# Patient Record
Sex: Female | Born: 1945
Health system: Southern US, Community
[De-identification: ages and names within clinical notes are randomized; demographics above are authoritative.]

## PROBLEM LIST (undated history)

## (undated) DIAGNOSIS — R55 Syncope and collapse: Secondary | ICD-10-CM

## (undated) DIAGNOSIS — R002 Palpitations: Secondary | ICD-10-CM

## (undated) DIAGNOSIS — R42 Dizziness and giddiness: Secondary | ICD-10-CM

## (undated) DIAGNOSIS — Z9889 Other specified postprocedural states: Secondary | ICD-10-CM

## (undated) DIAGNOSIS — Z87442 Personal history of urinary calculi: Secondary | ICD-10-CM

## (undated) DIAGNOSIS — I10 Essential (primary) hypertension: Secondary | ICD-10-CM

## (undated) DIAGNOSIS — D473 Essential (hemorrhagic) thrombocythemia: Secondary | ICD-10-CM

## (undated) DIAGNOSIS — R112 Nausea with vomiting, unspecified: Secondary | ICD-10-CM

## (undated) DIAGNOSIS — K219 Gastro-esophageal reflux disease without esophagitis: Secondary | ICD-10-CM

## (undated) DIAGNOSIS — E785 Hyperlipidemia, unspecified: Secondary | ICD-10-CM

## (undated) DIAGNOSIS — I4819 Other persistent atrial fibrillation: Secondary | ICD-10-CM

## (undated) DIAGNOSIS — I071 Rheumatic tricuspid insufficiency: Secondary | ICD-10-CM

## (undated) DIAGNOSIS — E1169 Type 2 diabetes mellitus with other specified complication: Secondary | ICD-10-CM

## (undated) DIAGNOSIS — I429 Cardiomyopathy, unspecified: Secondary | ICD-10-CM

## (undated) DIAGNOSIS — I472 Ventricular tachycardia, unspecified: Secondary | ICD-10-CM

## (undated) DIAGNOSIS — I4892 Unspecified atrial flutter: Secondary | ICD-10-CM

## (undated) DIAGNOSIS — Q211 Atrial septal defect, unspecified: Secondary | ICD-10-CM

## (undated) DIAGNOSIS — F419 Anxiety disorder, unspecified: Secondary | ICD-10-CM

## (undated) DIAGNOSIS — I5030 Unspecified diastolic (congestive) heart failure: Secondary | ICD-10-CM

## (undated) DIAGNOSIS — I340 Nonrheumatic mitral (valve) insufficiency: Secondary | ICD-10-CM

## (undated) DIAGNOSIS — I447 Left bundle-branch block, unspecified: Secondary | ICD-10-CM

## (undated) HISTORY — DX: Ventricular tachycardia, unspecified: I47.20

## (undated) HISTORY — PX: BREAST EXCISIONAL BIOPSY: SUR124

## (undated) HISTORY — DX: Atrial septal defect: Q21.1

## (undated) HISTORY — DX: Palpitations: R00.2

## (undated) HISTORY — DX: Left bundle-branch block, unspecified: I44.7

## (undated) HISTORY — DX: Essential (hemorrhagic) thrombocythemia: D47.3

## (undated) HISTORY — DX: Unspecified atrial flutter: I48.92

## (undated) HISTORY — DX: Dizziness and giddiness: R42

## (undated) HISTORY — PX: TUBAL LIGATION: SHX77

## (undated) HISTORY — DX: Cardiomyopathy, unspecified: I42.9

## (undated) HISTORY — DX: Nonrheumatic mitral (valve) insufficiency: I34.0

## (undated) HISTORY — DX: Ventricular tachycardia: I47.2

## (undated) HISTORY — DX: Rheumatic tricuspid insufficiency: I07.1

## (undated) HISTORY — PX: TONSILLECTOMY: SUR1361

## (undated) HISTORY — DX: Unspecified diastolic (congestive) heart failure: I50.30

## (undated) HISTORY — DX: Essential (primary) hypertension: I10

## (undated) HISTORY — PX: BREAST SURGERY: SHX581

## (undated) HISTORY — DX: Other persistent atrial fibrillation: I48.19

## (undated) HISTORY — DX: Hyperlipidemia, unspecified: E78.5

## (undated) HISTORY — DX: Atrial septal defect, unspecified: Q21.10

## (undated) HISTORY — DX: Syncope and collapse: R55

## (undated) HISTORY — PX: APPENDECTOMY: SHX54

---
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
## 1992-10-30 HISTORY — PX: ASD REPAIR: SHX258

## 1998-03-10 ENCOUNTER — Ambulatory Visit (HOSPITAL_COMMUNITY): Admission: RE | Admit: 1998-03-10 | Discharge: 1998-03-10 | Payer: Self-pay | Admitting: Family Medicine

## 1998-11-03 ENCOUNTER — Other Ambulatory Visit: Admission: RE | Admit: 1998-11-03 | Discharge: 1998-11-03 | Payer: Self-pay | Admitting: Family Medicine

## 1999-12-21 ENCOUNTER — Other Ambulatory Visit: Admission: RE | Admit: 1999-12-21 | Discharge: 1999-12-21 | Payer: Self-pay | Admitting: Family Medicine

## 2001-02-14 ENCOUNTER — Other Ambulatory Visit: Admission: RE | Admit: 2001-02-14 | Discharge: 2001-02-14 | Payer: Self-pay | Admitting: General Surgery

## 2002-02-14 ENCOUNTER — Ambulatory Visit (HOSPITAL_COMMUNITY): Admission: RE | Admit: 2002-02-14 | Discharge: 2002-02-14 | Payer: Self-pay | Admitting: General Surgery

## 2002-02-14 ENCOUNTER — Encounter: Payer: Self-pay | Admitting: General Surgery

## 2002-03-06 ENCOUNTER — Other Ambulatory Visit: Admission: RE | Admit: 2002-03-06 | Discharge: 2002-03-06 | Payer: Self-pay | Admitting: General Surgery

## 2002-04-09 ENCOUNTER — Ambulatory Visit (HOSPITAL_COMMUNITY): Admission: RE | Admit: 2002-04-09 | Discharge: 2002-04-09 | Payer: Self-pay | Admitting: Cardiology

## 2003-03-25 ENCOUNTER — Ambulatory Visit (HOSPITAL_COMMUNITY): Admission: RE | Admit: 2003-03-25 | Discharge: 2003-03-25 | Payer: Self-pay | Admitting: General Surgery

## 2003-03-25 ENCOUNTER — Encounter: Payer: Self-pay | Admitting: General Surgery

## 2004-05-13 ENCOUNTER — Ambulatory Visit (HOSPITAL_COMMUNITY): Admission: RE | Admit: 2004-05-13 | Discharge: 2004-05-13 | Payer: Self-pay | Admitting: Family Medicine

## 2004-05-25 ENCOUNTER — Ambulatory Visit (HOSPITAL_COMMUNITY): Admission: RE | Admit: 2004-05-25 | Discharge: 2004-05-25 | Payer: Self-pay | Admitting: Family Medicine

## 2004-09-14 ENCOUNTER — Ambulatory Visit: Payer: Self-pay | Admitting: Family Medicine

## 2004-10-26 ENCOUNTER — Ambulatory Visit: Payer: Self-pay | Admitting: Family Medicine

## 2004-11-15 ENCOUNTER — Ambulatory Visit: Payer: Self-pay | Admitting: Family Medicine

## 2005-02-21 ENCOUNTER — Ambulatory Visit: Payer: Self-pay | Admitting: Family Medicine

## 2005-05-24 ENCOUNTER — Ambulatory Visit: Payer: Self-pay | Admitting: Family Medicine

## 2005-07-12 ENCOUNTER — Ambulatory Visit: Payer: Self-pay | Admitting: Family Medicine

## 2005-09-01 ENCOUNTER — Ambulatory Visit (HOSPITAL_COMMUNITY): Admission: RE | Admit: 2005-09-01 | Discharge: 2005-09-01 | Payer: Self-pay | Admitting: Family Medicine

## 2005-09-27 ENCOUNTER — Ambulatory Visit (HOSPITAL_COMMUNITY): Admission: RE | Admit: 2005-09-27 | Discharge: 2005-09-27 | Payer: Self-pay | Admitting: Family Medicine

## 2006-02-14 ENCOUNTER — Ambulatory Visit: Payer: Self-pay | Admitting: Family Medicine

## 2006-03-22 ENCOUNTER — Ambulatory Visit: Payer: Self-pay | Admitting: Family Medicine

## 2006-07-10 ENCOUNTER — Ambulatory Visit: Payer: Self-pay | Admitting: Family Medicine

## 2006-09-04 ENCOUNTER — Ambulatory Visit: Payer: Self-pay | Admitting: Family Medicine

## 2006-10-01 ENCOUNTER — Ambulatory Visit (HOSPITAL_COMMUNITY): Admission: RE | Admit: 2006-10-01 | Discharge: 2006-10-01 | Payer: Self-pay | Admitting: General Surgery

## 2006-11-07 ENCOUNTER — Ambulatory Visit (HOSPITAL_COMMUNITY): Admission: RE | Admit: 2006-11-07 | Discharge: 2006-11-07 | Payer: Self-pay | Admitting: General Surgery

## 2006-11-14 ENCOUNTER — Ambulatory Visit (HOSPITAL_COMMUNITY): Admission: RE | Admit: 2006-11-14 | Discharge: 2006-11-14 | Payer: Self-pay | Admitting: General Surgery

## 2006-11-29 ENCOUNTER — Ambulatory Visit (HOSPITAL_COMMUNITY): Admission: RE | Admit: 2006-11-29 | Discharge: 2006-11-29 | Payer: Self-pay | Admitting: General Surgery

## 2006-11-29 ENCOUNTER — Encounter (INDEPENDENT_AMBULATORY_CARE_PROVIDER_SITE_OTHER): Payer: Self-pay | Admitting: *Deleted

## 2007-01-07 ENCOUNTER — Other Ambulatory Visit: Admission: RE | Admit: 2007-01-07 | Discharge: 2007-01-07 | Payer: Self-pay | Admitting: General Surgery

## 2007-01-07 ENCOUNTER — Encounter (INDEPENDENT_AMBULATORY_CARE_PROVIDER_SITE_OTHER): Payer: Self-pay | Admitting: Specialist

## 2007-01-15 ENCOUNTER — Ambulatory Visit: Payer: Self-pay | Admitting: Family Medicine

## 2007-03-27 ENCOUNTER — Ambulatory Visit: Payer: Self-pay | Admitting: Family Medicine

## 2008-01-23 ENCOUNTER — Ambulatory Visit (HOSPITAL_COMMUNITY): Admission: RE | Admit: 2008-01-23 | Discharge: 2008-01-23 | Payer: Self-pay | Admitting: General Surgery

## 2009-03-17 ENCOUNTER — Ambulatory Visit (HOSPITAL_COMMUNITY): Admission: RE | Admit: 2009-03-17 | Discharge: 2009-03-17 | Payer: Self-pay | Admitting: General Surgery

## 2009-10-30 DIAGNOSIS — I447 Left bundle-branch block, unspecified: Secondary | ICD-10-CM

## 2009-10-30 HISTORY — DX: Left bundle-branch block, unspecified: I44.7

## 2009-12-24 ENCOUNTER — Encounter (INDEPENDENT_AMBULATORY_CARE_PROVIDER_SITE_OTHER): Payer: Self-pay | Admitting: *Deleted

## 2009-12-24 ENCOUNTER — Observation Stay (HOSPITAL_COMMUNITY): Admission: EM | Admit: 2009-12-24 | Discharge: 2009-12-25 | Payer: Self-pay | Admitting: Emergency Medicine

## 2009-12-24 ENCOUNTER — Ambulatory Visit: Payer: Self-pay | Admitting: Cardiovascular Disease

## 2009-12-24 HISTORY — PX: CARDIAC CATHETERIZATION: SHX172

## 2009-12-24 LAB — CONVERTED CEMR LAB
BUN: 13 mg/dL
CO2: 25 meq/L
Calcium: 9.4 mg/dL
Chloride: 106 meq/L
Cholesterol: 183 mg/dL
Creatinine, Ser: 0.65 mg/dL
GFR calc non Af Amer: >60 mL/min
Glomerular Filtration Rate, Af Am: >60 mL/min/{1.73_m2}
Glucose, Bld: 113 mg/dL
HDL: 37 mg/dL
Hgb A1c MFr Bld: 6.4 %
LDL Cholesterol: 68 mg/dL
Magnesium: 2 mg/dL
Potassium: 3.7 meq/L
Sodium: 142 meq/L
Triglycerides: 392 mg/dL

## 2009-12-27 ENCOUNTER — Encounter (INDEPENDENT_AMBULATORY_CARE_PROVIDER_SITE_OTHER): Payer: Self-pay

## 2009-12-27 DIAGNOSIS — R55 Syncope and collapse: Secondary | ICD-10-CM | POA: Insufficient documentation

## 2009-12-27 DIAGNOSIS — R42 Dizziness and giddiness: Secondary | ICD-10-CM | POA: Insufficient documentation

## 2009-12-27 DIAGNOSIS — R079 Chest pain, unspecified: Secondary | ICD-10-CM | POA: Insufficient documentation

## 2009-12-27 DIAGNOSIS — R002 Palpitations: Secondary | ICD-10-CM | POA: Insufficient documentation

## 2010-01-06 ENCOUNTER — Ambulatory Visit: Payer: Self-pay | Admitting: Cardiology

## 2010-01-11 ENCOUNTER — Encounter (INDEPENDENT_AMBULATORY_CARE_PROVIDER_SITE_OTHER): Payer: Self-pay | Admitting: *Deleted

## 2010-01-14 ENCOUNTER — Ambulatory Visit: Payer: Self-pay | Admitting: Cardiology

## 2010-02-11 ENCOUNTER — Encounter: Payer: Self-pay | Admitting: Cardiology

## 2010-02-11 ENCOUNTER — Ambulatory Visit: Payer: Self-pay | Admitting: Cardiology

## 2010-02-15 ENCOUNTER — Encounter: Payer: Self-pay | Admitting: Cardiology

## 2010-03-21 ENCOUNTER — Telehealth (INDEPENDENT_AMBULATORY_CARE_PROVIDER_SITE_OTHER): Payer: Self-pay | Admitting: *Deleted

## 2010-04-27 ENCOUNTER — Encounter (INDEPENDENT_AMBULATORY_CARE_PROVIDER_SITE_OTHER): Payer: Self-pay | Admitting: *Deleted

## 2010-04-27 DIAGNOSIS — E785 Hyperlipidemia, unspecified: Secondary | ICD-10-CM | POA: Insufficient documentation

## 2010-05-05 ENCOUNTER — Ambulatory Visit (HOSPITAL_COMMUNITY): Admission: RE | Admit: 2010-05-05 | Discharge: 2010-05-05 | Payer: Self-pay | Admitting: General Surgery

## 2010-05-27 ENCOUNTER — Encounter: Payer: Self-pay | Admitting: Cardiology

## 2010-05-27 ENCOUNTER — Encounter (INDEPENDENT_AMBULATORY_CARE_PROVIDER_SITE_OTHER): Payer: Self-pay | Admitting: *Deleted

## 2010-05-27 LAB — CONVERTED CEMR LAB
Cholesterol: 160 mg/dL
Cholesterol: 160 mg/dL (ref 0–200)
HDL: 38 mg/dL
HDL: 38 mg/dL — ABNORMAL LOW (ref >39)
LDL Cholesterol: 61 mg/dL
LDL Cholesterol: 61 mg/dL (ref 0–99)
Total CHOL/HDL Ratio: 4.2
Triglycerides: 306 mg/dL
Triglycerides: 306 mg/dL — ABNORMAL HIGH (ref <150)
VLDL: 61 mg/dL — ABNORMAL HIGH (ref 0–40)

## 2010-06-02 ENCOUNTER — Encounter (INDEPENDENT_AMBULATORY_CARE_PROVIDER_SITE_OTHER): Payer: Self-pay | Admitting: *Deleted

## 2010-06-02 ENCOUNTER — Telehealth (INDEPENDENT_AMBULATORY_CARE_PROVIDER_SITE_OTHER): Payer: Self-pay | Admitting: *Deleted

## 2010-07-14 ENCOUNTER — Encounter (INDEPENDENT_AMBULATORY_CARE_PROVIDER_SITE_OTHER): Payer: Self-pay | Admitting: *Deleted

## 2010-07-18 ENCOUNTER — Encounter (INDEPENDENT_AMBULATORY_CARE_PROVIDER_SITE_OTHER): Payer: Self-pay | Admitting: *Deleted

## 2010-07-18 ENCOUNTER — Ambulatory Visit: Payer: Self-pay | Admitting: Cardiology

## 2010-08-16 LAB — CONVERTED CEMR LAB
BUN: 16 mg/dL (ref 6–23)
CO2: 28 meq/L (ref 19–32)
Calcium: 10 mg/dL (ref 8.4–10.5)
Chloride: 102 meq/L (ref 96–112)
Creatinine, Ser: 0.71 mg/dL (ref 0.40–1.20)
Glucose, Bld: 111 mg/dL — ABNORMAL HIGH (ref 70–99)
Potassium: 4.9 meq/L (ref 3.5–5.3)
Sodium: 140 meq/L (ref 135–145)

## 2010-09-19 LAB — CONVERTED CEMR LAB
ALT: 23 units/L (ref 0–35)
AST: 22 units/L (ref 0–37)
Albumin: 4.7 g/dL (ref 3.5–5.2)
Alkaline Phosphatase: 92 units/L (ref 39–117)
BUN: 15 mg/dL (ref 6–23)
CO2: 29 meq/L (ref 19–32)
Calcium: 9.7 mg/dL (ref 8.4–10.5)
Chloride: 102 meq/L (ref 96–112)
Cholesterol: 191 mg/dL (ref 0–200)
Creatinine, Ser: 0.63 mg/dL (ref 0.40–1.20)
Glucose, Bld: 91 mg/dL (ref 70–99)
HDL: 38 mg/dL — ABNORMAL LOW (ref >39)
LDL Cholesterol: 89 mg/dL (ref 0–99)
Potassium: 5.3 meq/L (ref 3.5–5.3)
Sodium: 143 meq/L (ref 135–145)
Total Bilirubin: 0.4 mg/dL (ref 0.3–1.2)
Total CHOL/HDL Ratio: 5
Total Protein: 7.2 g/dL (ref 6.0–8.3)
Triglycerides: 321 mg/dL — ABNORMAL HIGH (ref <150)
VLDL: 64 mg/dL — ABNORMAL HIGH (ref 0–40)

## 2010-09-29 ENCOUNTER — Encounter: Payer: Self-pay | Admitting: Cardiology

## 2010-11-29 ENCOUNTER — Encounter (INDEPENDENT_AMBULATORY_CARE_PROVIDER_SITE_OTHER): Payer: Self-pay | Admitting: *Deleted

## 2010-11-29 LAB — CONVERTED CEMR LAB
ALT: 15 units/L
AST: 13 units/L
Albumin: 4.6 g/dL
Alkaline Phosphatase: 88 units/L
BUN: 18 mg/dL
CO2: 30 meq/L
Calcium: 9.8 mg/dL
Chloride: 101 meq/L
Cholesterol: 189 mg/dL
Creatinine, Ser: 0.73 mg/dL
GFR calc non Af Amer: >60 mL/min
Glomerular Filtration Rate, Af Am: >60 mL/min/{1.73_m2}
Glucose, Bld: 103 mg/dL
HDL: 37 mg/dL
LDL Cholesterol: 96 mg/dL
Potassium: 4.9 meq/L
Sodium: 141 meq/L
Total Protein: 7.2 g/dL
Triglycerides: 278 mg/dL

## 2010-12-01 NOTE — Miscellaneous (Signed)
Summary: hospital labs 12/24/2009  Clinical Lists Changes  Observations: Added new observation of MAGNESIUM: 2.0 mg/dL (16/07/9603 54:09) Added new observation of CALCIUM: 9.4 mg/dL (81/19/1478 29:56) Added new observation of GFR AA: >60 mL/min/1.81m2 (12/24/2009 10:04) Added new observation of GFR: >60 mL/min (12/24/2009 10:04) Added new observation of CREATININE: 0.65 mg/dL (21/30/8657 84:69) Added new observation of BUN: 13 mg/dL (62/95/2841 32:44) Added new observation of BG RANDOM: 113 mg/dL (11/01/7251 66:44) Added new observation of CO2 PLSM/SER: 25 meq/L (12/24/2009 10:04) Added new observation of CL SERUM: 106 meq/L (12/24/2009 10:04) Added new observation of K SERUM: 3.7 meq/L (12/24/2009 10:04) Added new observation of NA: 142 meq/L (12/24/2009 10:04) Added new observation of LDL: 68 mg/dL (03/47/4259 56:38) Added new observation of HDL: 37 mg/dL (75/64/3329 51:88) Added new observation of TRIGLYC TOT: 392 mg/dL (41/66/0630 16:01) Added new observation of CHOLESTEROL: 183 mg/dL (09/32/3557 32:20) Added new observation of HGBA1C: 6.4 % (12/24/2009 10:04)

## 2010-12-01 NOTE — Letter (Signed)
Summary: [place] [facility] Work Engineer, agricultural at [employer]  [address] [place] [address]   Phone: [phone]  Fax: [phone]     July 18, 2010 MRN: [id number]   Heather Barber [address] [place], [place]  [address]      YOUR LAB WORK IS DUE   September 19, 2010  Please go to [facility], located across the street from [facility] on the second floor.  Hours are Monday - Friday 7am until 7:30pm         Saturday 8am until 12noon    _X_  DO NOT EAT OR DRINK AFTER MIDNIGHT EVENING PRIOR TO LABWORK  __ YOUR LABWORK IS NOT FASTING --YOU MAY EAT PRIOR TO LABWORK

## 2010-12-01 NOTE — Letter (Signed)
Summary: [place] [facility] Work Engineer, agricultural at [employer]  [address] [place] [address]   Phone: [phone]  Fax: [phone]     April 27, 2010 MRN: [id number]   Heather Barber [address] [place], [place]  [address]      YOUR LAB WORK IS DUE   May 27, 2010  Please go to [facility], located across the street from [facility] on the second floor.  Hours are Monday - Friday 7am until 7:30pm         Saturday 8am until 12noon    _X_  DO NOT EAT OR DRINK AFTER MIDNIGHT EVENING PRIOR TO LABWORK  __ YOUR LABWORK IS NOT FASTING --YOU MAY EAT PRIOR TO LABWORK

## 2010-12-01 NOTE — Assessment & Plan Note (Signed)
Summary: 6 mth f/u per checkout on 01/14/10/tg   Visit Type:  Follow-up Primary Provider:  Dr. Lysbeth Galas   History of Present Illness: [name] returns to the office for continued assessment and treatment of cardiovascular risk factors with negative coronary angiography in 2011, left bundle branch block, a remote ASD repair, and exercise-induced ventricular tachycardia.  Since her last visit 6 months ago, she has done quite well.  She has experienced no chest discomfort, no dyspnea, no lightheadedness, no syncope and no palpitations.  She has tolerated, without adverse effects, changes to her therapy for hyperlipidemia and hypertension.  Current Medications (verified): 1)  Aspir-Low 81 Mg Tbec (Aspirin) .... Take 1 Tab Daily 2)  Pravastatin Sodium 80 Mg Tabs (Pravastatin Sodium) .... Take One Tablet By Mouth Daily At Bedtime 3)  Prilosec 20 Mg Cpdr (Omeprazole) .... Take 1 Tab Daily 4)  Fish Oil 1000 Mg Caps (Omega-3 Fatty Acids) .... Take 1 Cap Two Times A Day 5)  Daily Multi  Tabs (Multiple Vitamins-Minerals) .... Take 1 Tab Daily 6)  Diltiazem Hcl Er Beads 180 Mg Xr24h-Cap (Diltiazem Hcl Er Beads) .... Take One Capsule By Mouth Daily 7)  Hydrochlorothiazide 12.5 Mg Tabs (Hydrochlorothiazide) .... Take 1 Tablet By Mouth Once A Day  Allergies (verified): 1)  ! Morphine  Past History:  PMH, FH, and Social History reviewed and updated.  Review of Systems       See history of present illness.  Vital Signs:  Patient profile:   65 year old female Weight:      177 pounds Pulse rate:   84 / minute BP sitting:   151 / 80  (right arm)  Vitals Entered By: Dreama Saa, CNA (July 18, 2010 10:40 AM)  Physical Exam  General:    Overweight; no acute distress.  NECK: Supple; no JVD.  Normal carotid pulsations.  CV: Nl S1and S2;   2-3/6 SEM radiating to both carotids.  LUNGS: Clear to auscultation bilaterally.Marland Kitchen  SKIN: No lesions.   ABDOMEN:  Soft, nondistended, nontender.  No  guarding or rigidity.  EXT: Trace pedal edema bilaterally;  No cyanosis or clubbing.  NEURO:  Alert and oriented x3.  Cranial nerves II-XII   are grossly intact.  Strength is 5/5 in all extremities    Impression & Recommendations:  Problem # 1:  VENTRICULAR TACHYCARDIA, EXERCISE-INDUCED (ICD-427.1) She is asymptomatic with current therapy and believes that treatment with diltiazem has had a salutary effect on her symptoms.  This medication is also contributing to treatment for hypertension.  Problem # 2:  HYPERTENSION (ICD-401.1) Blood pressure was somewhat suboptimal today.  She has been taking hydrochlorothiazide on a p.r.n. basis and will begin taking it daily.  We will monitor blood pressure, electrolytes and renal function.  Problem # 3:  HYPERLIPIDEMIA (ICD-272.4) Recent lipid profile was good, but triglycerides remain elevated.   Since she has no known vascular disease, perfect control of lipids is not necessarily required.  To decrease her cost of pharmaceuticals, atorvastatin will be changed to pravastatin 80 mg q.d. with a repeat lipid profile in one month or I will be happy to continue to see this nice woman on an annual basis for reevaluation and adjustment of her medical regime.  CHOL: 160 (05/27/2010)   LDL: 61 (05/27/2010)   HDL: 38 (05/27/2010)   TG: 306 (05/27/2010)  Other Orders: Future Orders: T-Basic Metabolic Panel [id number]) ... 08/17/2010 T-Lipid Profile [phone]) ... 09/19/2010 T-Comprehensive Metabolic Panel [id number]) ... 09/19/2010  Patient Instructions: 1)  Your physician recommends that you schedule a follow-up appointment in: 1 YEAR 2)  Your physician recommends that you return for lab work in: 1 AND 2 MONTHS 3)  Your physician has recommended you make the following change in your medication:  AFTER CURRENT RX STOOP LIPITOR AND START PRAVASTATIN 80MG  DAILY, TAKE HYDROCHLORATHIAZIDE DAILY 4)  Your physician has requested that you regularly monitor  and record your blood pressure readings at home.  Please use the same machine at the same time of day to check your readings and record them to bring to your follow-up visit. CALL OUR OFFICE IF TOP NUMBER IS > 140 Prescriptions: HYDROCHLOROTHIAZIDE 12.5 MG TABS (HYDROCHLOROTHIAZIDE) Take 1 tablet by mouth once a day  #30 x 6   Entered by:   Teressa Lower RN   Authorized by:   Kathlen Brunswick, MD, [facility]   Signed by:   Teressa Lower RN on 07/18/2010   Method used:   Faxed to ...       Hospital doctor (retail)       [address]       [place], [place]  [address]       Ph: [phone] or [phone]       Fax: [phone]   RxID:   [id number] PRAVASTATIN SODIUM 80 MG TABS (PRAVASTATIN SODIUM) Take one tablet by mouth daily at bedtime  #30 x 6   Entered by:   Teressa Lower RN   Authorized by:   Kathlen Brunswick, MD, [facility]   Signed by:   Teressa Lower RN on 07/18/2010   Method used:   Faxed to ...       Hospital doctor (retail)       [address]       [place], [place]  [address]       Ph: [phone] or [phone]       Fax: [phone]   RxID:   [id number]

## 2010-12-01 NOTE — Assessment & Plan Note (Signed)
Summary: PT PAST DUE FOR F/U PER PT PHONE CALL/TG   Visit Type:  Follow-up Primary Provider:  Dr. Lysbeth Galas   History of Present Illness: Patient returns to see me after a 17 year hiatus and a recent admission to [facility] with chest pain.  Cardiac catheterization revealed normal coronary arteries and normal left ventricular systolic function.  Although not emphasized in the chart notes, the patient describes palpitations accompanying her chest discomfort, diaphoresis and dyspnea.  Episodes lasted for a number of minutes and resolved spontaneously.  The symptoms had been occurring over the month prior to admission.  She sought attention at her physician's office, who arranged transportation to [facility] by EMS.  She has not had any problems since hospital discharge.  No changes were made in her medications.  She is now wearing an event recorder, but no additional symptomatic spells have been reported and no significant arrhythmias recorded.  She has had some PVCs, normal sinus rhythm and sinus tachycardia.  Current Medications (verified): 1)  Aspir-Low 81 Mg Tbec (Aspirin) .... Take 1 Tab Daily 2)  Simvastatin 80 Mg Tabs (Simvastatin) .... Take 1 Tab Daily 3)  Prilosec 20 Mg Cpdr (Omeprazole) .... Take 1 Tab Daily 4)  Fish Oil 1000 Mg Caps (Omega-3 Fatty Acids) .... Take 1 Cap Two Times A Day 5)  Daily Multi  Tabs (Multiple Vitamins-Minerals) .... Take 1 Tab Daily 6)  Diltiazem Hcl Er Beads 180 Mg Xr24h-Cap (Diltiazem Hcl Er Beads) .... Take One Capsule By Mouth Daily  Allergies (verified): 1)  ! Morphine  Past History:  Past Surgical History: Last updated: 01/13/2010 ASD closure had [facility] in 1994 Appendectomy Excisional biopsy from right and left breast BTL Tonsillectomy  Family History: Last updated: February 08, 2010 Mother deceased due to neoplastic disease No family history for coronary disease Siblings-sister had ASD or VSD; second sister had history  of CVA  Social History: Last updated: 02-08-10 Married and resides in [place] with her husband; 3 children Employment-local printing shop Tobacco-none Alcohol-none Lifestyle-sedentary  Past Medical History: Chest pain-normal coronary angiography in 1994 and 2011; Left bundle branch block-onset in 2011 ASD repair-1994 Exercise-induced ventricular tachycardia Hyperlipidemia Hypertension DIZZINESS (ICD-780.4) SYNCOPE (ICD-780.2) PALPITATIONS (ICD-785.1)  Family History: Mother deceased due to neoplastic disease No family history for coronary disease Siblings-sister had ASD or VSD; second sister had history of CVA  Social History: Married and resides in [place] with her husband; 3 children Employment-local printing shop Tobacco-none Alcohol-none Lifestyle-sedentary   Review of Systems  The patient denies weight loss, weight gain, chest pain, syncope, dyspnea on exertion, peripheral edema, prolonged cough, and headaches.    Vital Signs:  Patient profile:   65 year old female Height:      64 inches Weight:      176 pounds BMI:     30.32 Pulse rate:   93 / minute BP sitting:   131 / 72  (right arm)  Vitals Entered By: Dreama Saa, CNA (Feb 08, 2010 2:11 PM)  Physical Exam  General:    No acute distress.   HEENT:  Pupils equal, round and reactive to light.  The extraocular movements are intact.  Sclerae are clear.  Moist mucous membranes. NECK: Supple; no JVD.  Normal carotid pulsations.  CV: Nl S1and S2;   2-3/6 SEM radiating to both carotids.  LUNGS: Clear to auscultation bilaterally.Marland Kitchen  SKIN: No lesions.  ABDOMEN:  Soft, nondistended, nontender.  No guarding or rigidity.   EXT: Trace pedal edema bilaterally;  No cyanosis or clubbing.   NEURO:  Alert and oriented x3.  Cranial nerves II-XII   are grossly intact.  Strength is 5/5 in all extremities    Impression & Recommendations:  Problem # 1:  CHEST PAIN (ICD-786.50) Assessment Improved History is  highly suggestive of an arrhythmia as the cause of her symptoms, which is particularly of interest in light of her history of exercise-induced ventricular tachycardia.  With normal left ventricular systolic function, no electrocardiographic abnormalities and normal coronary arteries, even ventricular tachycardia would likely be a benign arrhythmia.  In the absence of recurrent symptoms, the etiology cannot be determined.  I suggested that she present to emergency department or to our office should she have recurrent symptoms, depending upon the time of day or night.  I will plan to see this nice woman again in 6 months.  Problem # 2:  VENTRICULAR TACHYCARDIA, EXERCISE-INDUCED (ICD-427.1) Since she requires treatment for hypertension, it is reasonable to use a drug that also might have beneficial effects on to an arrhythmia.  Hyzaar will be discontinued and diltiazem 180 mg q.d. started.   Problem # 3:  HYPERTENSION (ICD-401.1) Patient will monitor blood pressures at home and return in one month for blood pressure check by the cardiology nurses.  I will see this nice woman again in 6 months.  Patient Instructions: 1)  Your physician recommends that you schedule a follow-up appointment in: 6 MONTHS 2)  Your physician has recommended you make the following change in your medication: STOP HYZAAR, START DILTIAZEM 180MG  DAILY 3)  You have been referred to NURSE VISIT FOR BP CHECK IN 1 MONTH, BRING BP READINGS TO NURSE VISIT 4)  Your physician has requested that you regularly monitor and record your blood pressure readings at home.  Please use the same machine at the same time of day to check your readings and record them to bring to your follow-up visit. Prescriptions: DILTIAZEM HCL ER BEADS 180 MG XR24H-CAP (DILTIAZEM HCL ER BEADS) Take one capsule by mouth daily  #30 x 3   Entered by:   Teressa Lower RN   Authorized by:   Kathlen Brunswick, MD, [facility]   Signed by:   Teressa Lower RN on 01/14/2010    Method used:   Faxed to ...       Hospital doctor (retail)       [address]       [place], [place]  [address]       Ph: [phone] or [phone]       Fax: [phone]   RxID:   [id number]   Appended Document: PT PAST DUE FOR F/U PER PT PHONE CALL/TG Review of all statin users in our practice revealed that Ms. Sou is being treated with high dose simvastatin plus diltiazem.  Simvastatin will be discontinued and atorvastatin 40 mg once daily started.  Lipid profile will be reassessed in one month.  Patient is due to return to see me in 3 months.  [place] Bing, M.D.

## 2010-12-01 NOTE — Letter (Signed)
Summary: BP READINGS  BP READINGS   Imported By: Faythe Ghee 02/15/2010 16:19:53  _____________________________________________________________________  External Attachment:    Type:   Image     Comment:   External Document

## 2010-12-01 NOTE — Miscellaneous (Signed)
  Clinical Lists Changes  Medications: Changed medication from HYDROCHLOROTHIAZIDE 12.5 MG TABS (HYDROCHLOROTHIAZIDE) Take 1 tablet by mouth once a day to HYDROCHLOROTHIAZIDE 12.5 MG TABS (HYDROCHLOROTHIAZIDE) Take 1 tablet by mouth once a day - Signed Rx of HYDROCHLOROTHIAZIDE 12.5 MG TABS (HYDROCHLOROTHIAZIDE) Take 1 tablet by mouth once a day;  #90 x 2;  Signed;  Entered by: Teressa Lower RN;  Authorized by: Kathlen Brunswick, MD, [facility];  Method used: Faxed to [facility] [place], , , [place]  , Ph: [phone], Fax: [phone]    Prescriptions: HYDROCHLOROTHIAZIDE 12.5 MG TABS (HYDROCHLOROTHIAZIDE) Take 1 tablet by mouth once a day  #90 x 2   Entered by:   Teressa Lower RN   Authorized by:   Kathlen Brunswick, MD, [facility]   Signed by:   Teressa Lower RN on 09/29/2010   Method used:   Faxed to ...       [facility] [place] (mail-order)             , [place]         Ph: [phone]       Fax: [phone]   RxID:   [id number]

## 2010-12-01 NOTE — Letter (Signed)
Summary: [place] [facility] Work Engineer, agricultural at [employer]  [address], [place] [address]   Phone: [phone]  Fax: [phone]     July 18, 2010 MRN: [id number]   Heather Barber [address] [place], [place]  [address]      YOUR LAB WORK IS DUE   August 17, 2010  Please go to [facility], located across the street from [facility] on the second floor.  Hours are Monday - Friday 7am until 7:30pm         Saturday 8am until 12noon    __  DO NOT EAT OR DRINK AFTER MIDNIGHT EVENING PRIOR TO LABWORK  _X_ YOUR LABWORK IS NOT FASTING --YOU MAY EAT PRIOR TO LABWORK

## 2010-12-01 NOTE — Miscellaneous (Signed)
**Note De-Identified Heather Barber Obfuscation** Summary: Orders Update/Event monitor  Clinical Lists Changes  Problems: Added new problem of CHEST PAIN (ICD-786.50) Added new problem of PALPITATIONS (ICD-785.1) Added new problem of SYNCOPE (ICD-780.2) Added new problem of DIZZINESS (ICD-780.4) Orders: Added new Referral order of Cardionet/Event Monitor (Cardionet/Event) - Signed    Patient is aware of 21 day Event monitor. Heather Barber states that she thinks she can hook monitor up at home, she has been advised to call or come by office if she has trouble with hook up.

## 2010-12-01 NOTE — Letter (Signed)
Summary: [place] Results Engineer, agricultural at [facility]  [address] [place] [address]   Phone: [phone]  Fax: [phone]      June 02, 2010 MRN: [id number]   Heather Barber [address] [place], [place]  [address]   Dear [name],  Your test ordered by Selena Batten has been reviewed by your physician (or physician assistant) and was found to be normal or stable. Your physician (or physician assistant) felt no changes were needed at this time.  ____ Echocardiogram  ____ Cardiac Stress Test  __ x__ Lab Work  ____ Peripheral vascular study of arms, legs or neck  ____ CT scan or X-ray  ____ Lung or Breathing test  ____ Other:  No change in medical treatment at this time, per Dr. Dietrich Pates.  Enclosed is a copy of your labwork for your records.  Thank you, [name] RN    [place] Bing, MD, [facility].Gaylord Shih, MD, F.A.C.C Lewayne Bunting, MD, F.A.C.C Nona Dell, MD, F.A.C.C Charlton Haws, MD, [facility].C.C

## 2010-12-01 NOTE — Progress Notes (Signed)
Summary: Lab Results  Phone Note Call from Patient   Caller: Patient Reason for Call: Lab or Test Results, Privacy/Consent Authorization Summary of Call: pt would like lab results/tg Initial call taken by: Raechel Ache [facility],  June 02, 2010 10:22 AM  Follow-up for Phone Call        I called pt , results given , handout sent on triglycerides Follow-up by: Teressa Lower RN,  June 02, 2010 10:42 AM

## 2010-12-01 NOTE — Miscellaneous (Signed)
Summary: LIPIDS,05/27/2010  Clinical Lists Changes  Observations: Added new observation of LDL: 61 mg/dL (16/07/9603 54:09) Added new observation of HDL: 38 mg/dL (81/19/1478 29:56) Added new observation of TRIGLYC TOT: 306 mg/dL (21/30/8657 84:69) Added new observation of CHOLESTEROL: 160 mg/dL (62/95/2841 32:44)

## 2010-12-01 NOTE — Miscellaneous (Signed)
  Clinical Lists Changes  Problems: Added new problem of HYPERLIPIDEMIA (ICD-272.4) Medications: Changed medication from SIMVASTATIN 80 MG TABS (SIMVASTATIN) take 1 tab daily to LIPITOR 40 MG TABS (ATORVASTATIN CALCIUM) Take one tablet by mouth daily. - Signed Rx of LIPITOR 40 MG TABS (ATORVASTATIN CALCIUM) Take one tablet by mouth daily.;  #30 x 3;  Signed;  Entered by: Teressa Lower RN;  Authorized by: Kathlen Brunswick, MD, [facility];  Method used: Faxed to [facility], [phone] [address], [place], [place], [place]  [address], Ph: [phone] or [phone], Fax: [phone] Orders: Added new Test order of T-Lipid Profile [id number]) - Signed    Prescriptions: LIPITOR 40 MG TABS (ATORVASTATIN CALCIUM) Take one tablet by mouth daily.  #30 x 3   Entered by:   Teressa Lower RN   Authorized by:   Kathlen Brunswick, MD, [facility]   Signed by:   Teressa Lower RN on 04/27/2010   Method used:   Faxed to ...       Hospital doctor (retail)       [address]       [place], [place]  [address]       Ph: [phone] or [phone]       Fax: [phone]   RxID:   [id number]

## 2010-12-01 NOTE — Procedures (Signed)
Summary: [facility]   Imported By: Faythe Ghee 02/11/2010 15:08:24  _____________________________________________________________________  External Attachment:    Type:   Image     Comment:   External Document

## 2010-12-01 NOTE — Assessment & Plan Note (Signed)
Summary: 1 mth nurse visit per checkout on 01/14/10/tg  Nurse Visit   Vital Signs:  Patient profile:   65 year old female Height:      64 inches Weight:      175 pounds O2 Sat:      96 % on Room air Pulse rate:   74 / minute BP sitting:   132 / 77  (left arm)  Vitals Entered By: Teressa Lower RN (February 11, 2010 4:20 PM)  O2 Flow:  Room air  Visit Type:  1 month nurse visit Primary Provider:  Dr. Lysbeth Galas   History of Present Illness: S:1 month nurse visit B:last appt 01/14/2010, stoped  hyzaar and started diltiazem for hr and bp control A:  denies c/o, feeling much better, 1+ pitting edema in ankles bp diary 30 readings average hr:  82 , sbp:  150, dbp:  78, sbp > 150-8, dbp > 85-3 R: information given for low salt diet  4/21/11Change in medication appears effective.  F/U with me as planned.  [place] Bing, M.D.    Allergies (verified): 1)  ! Morphine

## 2010-12-01 NOTE — Progress Notes (Signed)
Summary: Legs Swelling  Phone Note Call from Patient   Caller: Patient Reason for Call: Talk to Nurse, Referral Summary of Call: pt states that her feet are swelling/wants to know if she can have fluid pill called in/if so, pt uses to [facility] Initial call taken by: Raechel Ache [facility],  Mar 21, 2010 8:19 AM  Follow-up for Phone Call        S: pt was at work and I spoke with her husband B: edema in feet x2 weeks A: hyzaar 50/12.5   d/c 01/14/10 , pt states she needs a little something to address her fluid Follow-up by: Teressa Lower RN,  Mar 21, 2010 10:27 AM  Additional Follow-up for Phone Call Additional follow up Details #1::        R: spoke with NP will readd hctZ 12.5mg  once daily as needed edema  Additional Follow-up by: Teressa Lower RN,  Mar 23, 2010 4:14 PM    New/Updated Medications: HYDROCHLOROTHIAZIDE 12.5 MG TABS (HYDROCHLOROTHIAZIDE) Take one tablet by mouth daily as needed swelling in legs Prescriptions: HYDROCHLOROTHIAZIDE 12.5 MG TABS (HYDROCHLOROTHIAZIDE) Take one tablet by mouth daily as needed swelling in legs  #30 x 1   Entered by:   Teressa Lower RN   Authorized by:   Joni Reining, NP   Signed by:   Teressa Lower RN on 03/23/2010   Method used:   Faxed to ...       Hospital doctor (retail)       [address]       [place], [place]  [address]       Ph: [phone] or [phone]       Fax: [phone]   RxID:   [id number]

## 2010-12-07 NOTE — Miscellaneous (Signed)
Summary: cmp,lipid fromDr. Lysbeth Galas  Clinical Lists Changes  Observations: Added new observation of CALCIUM: 9.8 mg/dL (16/07/9603 54:09) Added new observation of ALBUMIN: 4.6 g/dL (81/19/1478 29:56) Added new observation of PROTEIN, TOT: 7.2 g/dL (21/30/8657 84:69) Added new observation of SGPT (ALT): 15 units/L (11/29/2010 11:42) Added new observation of SGOT (AST): 13 units/L (11/29/2010 11:42) Added new observation of ALK PHOS: 88 units/L (11/29/2010 11:42) Added new observation of BILI DIRECT: total bili  0.3 mg/dL (62/95/2841 32:44) Added new observation of GFR AA: >60 mL/min/1.48m2 (11/29/2010 11:42) Added new observation of GFR: >60 mL/min (11/29/2010 11:42) Added new observation of CREATININE: 0.73 mg/dL (11/01/7251 66:44) Added new observation of BUN: 18 mg/dL (03/47/4259 56:38) Added new observation of BG RANDOM: 103 mg/dL (75/64/3329 51:88) Added new observation of CO2 PLSM/SER: 30 meq/L (11/29/2010 11:42) Added new observation of CL SERUM: 101 meq/L (11/29/2010 11:42) Added new observation of K SERUM: 4.9 meq/L (11/29/2010 11:42) Added new observation of NA: 141 meq/L (11/29/2010 11:42) Added new observation of LDL: 96 mg/dL (41/66/0630 16:01) Added new observation of HDL: 37 mg/dL (09/32/3557 32:20) Added new observation of TRIGLYC TOT: 278 mg/dL (25/42/7062 37:62) Added new observation of CHOLESTEROL: 189 mg/dL (83/15/1761 60:73)

## 2010-12-22 ENCOUNTER — Other Ambulatory Visit: Payer: Self-pay | Admitting: Obstetrics and Gynecology

## 2011-01-20 LAB — POCT CARDIAC MARKERS
CKMB, poc: <1 ng/mL — ABNORMAL LOW (ref 1.0–8.0)
Myoglobin, poc: 40.8 ng/mL (ref 12–200)
Troponin i, poc: <0.05 ng/mL (ref 0.00–0.09)

## 2011-01-20 LAB — CARDIAC PANEL(CRET KIN+CKTOT+MB+TROPI)
CK, MB: 0.8 ng/mL (ref 0.3–4.0)
CK, MB: 0.8 ng/mL (ref 0.3–4.0)
Relative Index: INVALID (ref 0.0–2.5)
Relative Index: INVALID (ref 0.0–2.5)
Total CK: 44 U/L (ref 7–177)
Total CK: 50 U/L (ref 7–177)
Troponin I: 0.02 ng/mL (ref 0.00–0.06)
Troponin I: 0.03 ng/mL (ref 0.00–0.06)

## 2011-01-20 LAB — APTT: aPTT: 31 seconds (ref 24–37)

## 2011-01-20 LAB — DIFFERENTIAL
Basophils Absolute: 0 10*3/uL (ref 0.0–0.1)
Basophils Relative: 0 % (ref 0–1)
Eosinophils Absolute: 0.1 10*3/uL (ref 0.0–0.7)
Eosinophils Relative: 2 % (ref 0–5)
Lymphocytes Relative: 29 % (ref 12–46)
Lymphs Abs: 2.5 10*3/uL (ref 0.7–4.0)
Monocytes Absolute: 0.5 10*3/uL (ref 0.1–1.0)
Monocytes Relative: 6 % (ref 3–12)
Neutro Abs: 5.5 10*3/uL (ref 1.7–7.7)
Neutrophils Relative %: 64 % (ref 43–77)

## 2011-01-20 LAB — CBC
HCT: 38.2 % (ref 36.0–46.0)
Hemoglobin: 13.3 g/dL (ref 12.0–15.0)
MCHC: 34.7 g/dL (ref 30.0–36.0)
MCV: 91.2 fL (ref 78.0–100.0)
Platelets: 461 10*3/uL — ABNORMAL HIGH (ref 150–400)
RBC: 4.19 MIL/uL (ref 3.87–5.11)
RDW: 13.1 % (ref 11.5–15.5)
WBC: 8.7 10*3/uL (ref 4.0–10.5)

## 2011-01-20 LAB — BASIC METABOLIC PANEL
BUN: 13 mg/dL (ref 6–23)
CO2: 25 mEq/L (ref 19–32)
Calcium: 9.4 mg/dL (ref 8.4–10.5)
Chloride: 106 mEq/L (ref 96–112)
Creatinine, Ser: 0.65 mg/dL (ref 0.4–1.2)
GFR calc Af Amer: >60 mL/min (ref >60)
GFR calc non Af Amer: >60 mL/min (ref >60)
Glucose, Bld: 113 mg/dL — ABNORMAL HIGH (ref 70–99)
Potassium: 3.7 mEq/L (ref 3.5–5.1)
Sodium: 142 mEq/L (ref 135–145)

## 2011-01-20 LAB — PROTIME-INR
INR: 0.95 (ref 0.00–1.49)
Prothrombin Time: 12.6 seconds (ref 11.6–15.2)

## 2011-01-20 LAB — CK TOTAL AND CKMB (NOT AT ARMC)
CK, MB: 0.9 ng/mL (ref 0.3–4.0)
Relative Index: INVALID (ref 0.0–2.5)
Total CK: 51 U/L (ref 7–177)

## 2011-01-20 LAB — MAGNESIUM: Magnesium: 2 mg/dL (ref 1.5–2.5)

## 2011-01-20 LAB — HEMOGLOBIN A1C
Hgb A1c MFr Bld: 6.4 % — ABNORMAL HIGH (ref 4.6–6.1)
Mean Plasma Glucose: 137 mg/dL

## 2011-01-20 LAB — LIPID PANEL
Cholesterol: 183 mg/dL (ref 0–200)
HDL: 37 mg/dL — ABNORMAL LOW (ref >39)
LDL Cholesterol: 68 mg/dL (ref 0–99)
Total CHOL/HDL Ratio: 4.9 RATIO
Triglycerides: 392 mg/dL — ABNORMAL HIGH (ref <150)
VLDL: 78 mg/dL — ABNORMAL HIGH (ref 0–40)

## 2011-01-20 LAB — TSH: TSH: 1.565 u[IU]/mL (ref 0.350–4.500)

## 2011-01-20 LAB — D-DIMER, QUANTITATIVE: D-Dimer, Quant: <0.22 ug/mL-FEU (ref 0.00–0.48)

## 2011-01-20 LAB — MRSA PCR SCREENING: MRSA by PCR: NEGATIVE

## 2011-01-20 LAB — TROPONIN I: Troponin I: 0.01 ng/mL (ref 0.00–0.06)

## 2011-03-07 ENCOUNTER — Other Ambulatory Visit: Payer: Self-pay

## 2011-03-07 MED ORDER — PRAVASTATIN SODIUM 80 MG PO TABS: 80.0000 mg | ORAL_TABLET | Freq: Every day | ORAL | Status: DC

## 2011-03-17 NOTE — Op Note (Signed)
Heather Barber, Heather Barber                ACCOUNT NO.:  [id number]   MEDICAL RECORD NO.:  [id number]          PATIENT TYPE:  AMB   LOCATION:  DAY                           FACILITY:  [facility]   PHYSICIAN:  Barbaraann Barthel, M.D. DATE OF BIRTH:  1946-05-31   DATE OF PROCEDURE:  11/29/2006  DATE OF DISCHARGE:                               OPERATIVE REPORT   SURGEON:  Dr. Malvin Johns.   PROCEDURE:  Right partial mastectomy.   SPECIMEN:  Right breast tissue.   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   NOTE:  This is a 64 year old white female who had a sister who had  carcinoma of the breast who presented with a nondiagnostic mammogram and  nondiagnostic needle localizations and was  referred for surgical biopsy  to rule out carcinoma.  We discussed the procedure in detail with the  patient, discussing complications not limited to but including bleeding,  infection, the possibility that further surgery may be required.  Informed consent was obtained.   GROSS OPERATIVE FINDINGS:  The patient had hematoma area from previous  needle localization and what appeared to be a circumscribed mass within  the breast tissue deep in the 1 o'clock position of the right breast.  Frozen section revealed the above diagnosis, and final pathology is  pending.   TECHNIQUE:  The patient was placed in the supine position.  After the  adequate administration of general anesthesia via endotracheal  intubation, her right hemithorax was prepped with Betadine solution and  draped in the usual manner.  The transverse incision was carried out  over the mass.  We dissected around what felt to be a hard circumscribed  mass, and there appeared to be a hematoma anterior to this.  We excised  this completely and sent this for frozen section with the above  rendering given.  We then irrigated the wound with normal saline  solution and closed the breast tissue with 3-0 Polysorb and the  skin  with a subcuticular 5-0 Polysorb suture.  Steri-Strips,  Neosporin and a sterile dressing was applied.  Prior to closure, all  sponge, needle and instrument counts were found to be correct.  Estimated blood loss was minimal.  The patient received 900 mL of  crystalloids intraoperatively.  There were no complications, and there  were no drains placed.      Barbaraann Barthel, M.D.  Electronically Signed     WB/MEDQ  D:  11/29/2006  T:  11/29/2006  Job:  [id number]   cc:   Barbaraann Barthel, M.D.  Fax: [phone]   Delaney Meigs, M.D.  Fax: [phone]   Judyann Munson, M.D.

## 2011-03-17 NOTE — Procedures (Signed)
[facility]  Patient:    Heather Barber, Heather Barber Visit Number: [id number] MRN: [id number]          Service Type: OUT Location: RAD Attending Physician:  Jone Baseman Dictated by:   Jacolyn Reedy, P.A.C. Proc. Date: 04/09/02 Admit Date:  04/09/2002 Discharge Date: 04/09/2002                                Stress Test  Baseline EKG:  Normal sinus rhythm.  Patient exercised 8 minutes 5 seconds of the Bruce protocol obtaining a heart rate of 163.  Target heart rate was 140. Test was stopped due to shortness of breath and chest tightness.  Patient did develop 1-2 mm ST depression inferolaterally which resolved quickly in recovery.  Patient had a history of being admitted to [facility] with atypical chest pain.  MI was ruled out with negative enzymes.  She does have hypertension, hyperlipidemia, and a history of AIC or VSD closure in the past. Cardiolite images are to follow. Dictated by:   Jacolyn Reedy, P.A.C. Attending Physician:  Jone Baseman DD:  04/09/02 TD:  04/11/02 Job: [id number]

## 2011-05-04 ENCOUNTER — Other Ambulatory Visit (HOSPITAL_COMMUNITY): Payer: Self-pay | Admitting: Family Medicine

## 2011-05-04 DIAGNOSIS — Z139 Encounter for screening, unspecified: Secondary | ICD-10-CM

## 2011-05-29 ENCOUNTER — Ambulatory Visit (HOSPITAL_COMMUNITY)
Admission: RE | Admit: 2011-05-29 | Discharge: 2011-05-29 | Disposition: A | Discharge status: Home / Self Care | Payer: Medicare Other | Source: Ambulatory Visit | Attending: Family Medicine | Admitting: Family Medicine

## 2011-05-29 ENCOUNTER — Other Ambulatory Visit (HOSPITAL_COMMUNITY): Payer: Self-pay | Admitting: General Surgery

## 2011-05-29 DIAGNOSIS — Z139 Encounter for screening, unspecified: Secondary | ICD-10-CM

## 2011-05-29 DIAGNOSIS — Z1231 Encounter for screening mammogram for malignant neoplasm of breast: Secondary | ICD-10-CM | POA: Insufficient documentation

## 2011-07-10 ENCOUNTER — Encounter: Payer: Self-pay | Admitting: Cardiology

## 2011-07-13 ENCOUNTER — Encounter: Payer: Self-pay | Admitting: Cardiology

## 2011-07-13 ENCOUNTER — Ambulatory Visit (INDEPENDENT_AMBULATORY_CARE_PROVIDER_SITE_OTHER): Payer: Medicare Other | Admitting: Cardiology

## 2011-07-13 DIAGNOSIS — Q211 Atrial septal defect, unspecified: Secondary | ICD-10-CM

## 2011-07-13 DIAGNOSIS — I4729 Other ventricular tachycardia: Secondary | ICD-10-CM

## 2011-07-13 DIAGNOSIS — I472 Ventricular tachycardia, unspecified: Secondary | ICD-10-CM

## 2011-07-13 DIAGNOSIS — I447 Left bundle-branch block, unspecified: Secondary | ICD-10-CM

## 2011-07-13 DIAGNOSIS — E785 Hyperlipidemia, unspecified: Secondary | ICD-10-CM

## 2011-07-13 DIAGNOSIS — R079 Chest pain, unspecified: Secondary | ICD-10-CM

## 2011-07-13 DIAGNOSIS — Q2111 Secundum atrial septal defect: Secondary | ICD-10-CM

## 2011-07-13 DIAGNOSIS — I10 Essential (primary) hypertension: Secondary | ICD-10-CM

## 2011-07-13 NOTE — Progress Notes (Signed)
**Note De-Identified Heather Barber Obfuscation** Recent lab results requested from Dr. Joyce Copa office and received. Placed in Dr. Marvel Plan reports folder for his review./LV

## 2011-07-13 NOTE — Assessment & Plan Note (Signed)
Patient has had no chest discomfort for quite some time.  This issue appears to be resolved.

## 2011-07-13 NOTE — Assessment & Plan Note (Signed)
Blood pressure control is excellent with current modest medical therapy, which will be continued.  Patient was congratulated on weight loss and advised to continue both exercise and caloric restriction.

## 2011-07-13 NOTE — Patient Instructions (Signed)
**Note De-Identified Kaulin Chaves Obfuscation** Your physician recommends that you continue on your current medications as directed. Please refer to the Current Medication list given to you today.  Your physician discussed the importance of regular exercise and recommended that you start or continue a regular exercise program for good health.  Your physician encouraged you to lose weight for better health.  Your physician recommends that you schedule a follow-up appointment in: 1 year

## 2011-07-13 NOTE — Assessment & Plan Note (Signed)
Heather Barber has had an excellent result from surgical closure of an ASD nearly 20 years ago.  She has no findings on exam to suggest a shunt at present, no evidence for pulmonary hypertension and has not developed atrial arrhythmias.

## 2011-07-13 NOTE — Progress Notes (Signed)
HPI :  Heather Barber returns to the office as scheduled for continued assessment and treatment of cardiovascular risk factors.  Since I last saw her one year ago, she has been generally quite well.  She maintains good level of activity with no chest discomfort and chronic class II dyspnea on exertion.  She has not been hospitalized or required urgent medical care.  She has not developed new medical problems, but was recently told of hypertriglyceridemia although cholesterol levels were good.  A new lipid-lowering medication has been prescribed for her, but she has not yet started taking it.  Current Outpatient Prescriptions on File Prior to Visit  Medication Sig Dispense Refill  . aspirin 81 MG tablet Take 81 mg by mouth daily.        Marland Kitchen diltiazem (CARDIZEM CD) 180 MG 24 hr capsule Take 180 mg by mouth daily.        . fish oil-omega-3 fatty acids 1000 MG capsule Take 1 capsule by mouth 2 (two) times daily.        . hydrochlorothiazide (,MICROZIDE/HYDRODIURIL,) 12.5 MG capsule Take 12.5 mg by mouth daily.        . Multiple Vitamin (MULTIVITAMIN) tablet Take 1 tablet by mouth daily.        Marland Kitchen omeprazole (PRILOSEC) 20 MG capsule Take 20 mg by mouth daily.        . pravastatin (PRAVACHOL) 80 MG tablet Take 1 tablet (80 mg total) by mouth daily.  30 tablet  4     Allergies  Allergen Reactions  . Morphine     REACTION: GI symptoms      Past medical history, social history, and family history reviewed and updated.  ROS: See history of present illness.  PHYSICAL EXAM: BP 133/71  Pulse 80  Ht 5\' 3"  (1.6 m)  Wt 173 lb (78.472 kg)  BMI 30.65 kg/m2  SpO2 96%  General-Well developed; no acute distress Body habitus-overweight Neck-No JVD; no carotid bruits Lungs-clear lung fields; resonant to percussion Cardiovascular-normal PMI; normal S1 and S2; grade 2/6 early systolic ejection murmur at the cardiac base; no splitting of the second heart sound appreciated; no precordial lifts Abdomen-normal bowel  sounds; soft and non-tender without masses or organomegaly Musculoskeletal-No deformities, no cyanosis or clubbing Neurologic-Normal cranial nerves; symmetric strength and tone Skin-Warm, no significant lesions Extremities-distal pulses intact; no edema   ASSESSMENT AND PLAN:

## 2011-07-13 NOTE — Assessment & Plan Note (Signed)
Recent laboratory studies from Dr. Lysbeth Galas has been requested.  Since patient has had normal coronary angiography and has no known vascular disease, extremely stringent control of lipids is not necessary.

## 2011-07-18 ENCOUNTER — Encounter: Payer: Self-pay | Admitting: Cardiology

## 2011-07-21 ENCOUNTER — Other Ambulatory Visit: Payer: Self-pay | Admitting: Adult Health

## 2011-08-02 ENCOUNTER — Other Ambulatory Visit (HOSPITAL_COMMUNITY)
Admission: RE | Admit: 2011-08-02 | Discharge: 2011-08-02 | Disposition: A | Discharge status: Home / Self Care | Payer: Medicare Other | Source: Ambulatory Visit | Attending: Obstetrics and Gynecology | Admitting: Obstetrics and Gynecology

## 2011-08-02 ENCOUNTER — Other Ambulatory Visit: Payer: Self-pay | Admitting: Obstetrics and Gynecology

## 2011-08-02 DIAGNOSIS — Z124 Encounter for screening for malignant neoplasm of cervix: Secondary | ICD-10-CM | POA: Insufficient documentation

## 2011-08-02 DIAGNOSIS — R87810 Cervical high risk human papillomavirus (HPV) DNA test positive: Secondary | ICD-10-CM | POA: Insufficient documentation

## 2011-08-08 ENCOUNTER — Other Ambulatory Visit: Payer: Self-pay | Admitting: *Deleted

## 2011-08-08 MED ORDER — PRAVASTATIN SODIUM 80 MG PO TABS: 80.0000 mg | ORAL_TABLET | Freq: Every day | ORAL | Status: DC

## 2011-09-07 ENCOUNTER — Other Ambulatory Visit: Payer: Self-pay | Admitting: Obstetrics and Gynecology

## 2011-10-06 ENCOUNTER — Other Ambulatory Visit: Payer: Self-pay | Admitting: *Deleted

## 2011-10-06 MED ORDER — DILTIAZEM HCL ER COATED BEADS 180 MG PO CP24: 180.0000 mg | ORAL_CAPSULE | Freq: Every day | ORAL | Status: DC

## 2012-01-23 ENCOUNTER — Other Ambulatory Visit (HOSPITAL_COMMUNITY): Payer: Self-pay | Admitting: General Surgery

## 2012-01-23 DIAGNOSIS — IMO0002 Reserved for concepts with insufficient information to code with codable children: Secondary | ICD-10-CM

## 2012-01-24 ENCOUNTER — Ambulatory Visit (HOSPITAL_COMMUNITY)
Admission: RE | Admit: 2012-01-24 | Discharge: 2012-01-24 | Disposition: A | Discharge status: Home / Self Care | Payer: Medicare Other | Source: Ambulatory Visit | Attending: General Surgery | Admitting: General Surgery

## 2012-01-24 DIAGNOSIS — D1739 Benign lipomatous neoplasm of skin and subcutaneous tissue of other sites: Secondary | ICD-10-CM | POA: Insufficient documentation

## 2012-01-24 DIAGNOSIS — IMO0002 Reserved for concepts with insufficient information to code with codable children: Secondary | ICD-10-CM

## 2012-01-24 DIAGNOSIS — R229 Localized swelling, mass and lump, unspecified: Secondary | ICD-10-CM | POA: Insufficient documentation

## 2012-01-24 MED ORDER — GADOBENATE DIMEGLUMINE 529 MG/ML IV SOLN
15.0000 mL | Freq: Once | INTRAVENOUS | Status: AC | PRN
  Administered 2012-01-24: 15 mL via INTRAVENOUS

## 2012-01-30 ENCOUNTER — Other Ambulatory Visit: Payer: Self-pay | Admitting: Adult Health

## 2012-02-08 ENCOUNTER — Ambulatory Visit (INDEPENDENT_AMBULATORY_CARE_PROVIDER_SITE_OTHER): Payer: Medicare Other | Admitting: Adult Health

## 2012-02-08 ENCOUNTER — Ambulatory Visit (HOSPITAL_COMMUNITY)
Admission: RE | Admit: 2012-02-08 | Discharge: 2012-02-08 | Disposition: A | Discharge status: Home / Self Care | Payer: Medicare Other | Source: Ambulatory Visit | Attending: Adult Health | Admitting: Adult Health

## 2012-02-08 ENCOUNTER — Encounter: Payer: Self-pay | Admitting: Adult Health

## 2012-02-08 VITALS — BP 148/87 | HR 95 | Resp 18 | Ht 66.0 in | Wt 173.0 lb

## 2012-02-08 DIAGNOSIS — R609 Edema, unspecified: Secondary | ICD-10-CM | POA: Insufficient documentation

## 2012-02-08 DIAGNOSIS — M6289 Other specified disorders of muscle: Secondary | ICD-10-CM

## 2012-02-08 DIAGNOSIS — I10 Essential (primary) hypertension: Secondary | ICD-10-CM

## 2012-02-08 DIAGNOSIS — R0989 Other specified symptoms and signs involving the circulatory and respiratory systems: Secondary | ICD-10-CM

## 2012-02-08 DIAGNOSIS — R079 Chest pain, unspecified: Secondary | ICD-10-CM | POA: Insufficient documentation

## 2012-02-08 DIAGNOSIS — Z7901 Long term (current) use of anticoagulants: Secondary | ICD-10-CM

## 2012-02-08 DIAGNOSIS — R0600 Dyspnea, unspecified: Secondary | ICD-10-CM

## 2012-02-08 DIAGNOSIS — E7800 Pure hypercholesterolemia, unspecified: Secondary | ICD-10-CM

## 2012-02-08 DIAGNOSIS — R0609 Other forms of dyspnea: Secondary | ICD-10-CM

## 2012-02-08 MED ORDER — POTASSIUM CHLORIDE ER 10 MEQ PO TBCR: 10.0000 meq | EXTENDED_RELEASE_TABLET | Freq: Every day | ORAL | Status: DC

## 2012-02-08 MED ORDER — FUROSEMIDE 20 MG PO TABS: 20.0000 mg | ORAL_TABLET | Freq: Every day | ORAL | Status: DC

## 2012-02-08 NOTE — Assessment & Plan Note (Signed)
Blood pressure is mildly elevated today compared to last visit of 133/71. Wt is the same.  No medication changes at present.

## 2012-02-08 NOTE — Progress Notes (Signed)
HPI: Heather Barber is a 66 y/o patient of Dr. Dietrich Pates we are seeing for ongoing assessment and treatment of hyperlipidemia, hypertension, with h/o Atrail Septal defect. She comes today with complaints of LEE L>R, PND and orthopnea, and feeling of fullness in her abdomen. She has had worsening symptoms of this for 2 weeks. She denies chest pain and but has class II symptoms of dyspnea chronically She has also had some complaints of "heart racing" at night waking her up.  Allergies  Allergen Reactions  . Morphine     REACTION: GI symptoms    Current Outpatient Prescriptions  Medication Sig Dispense Refill  . aspirin 81 MG tablet Take 81 mg by mouth daily.        Marland Kitchen diltiazem (CARDIZEM CD) 180 MG 24 hr capsule Take 1 capsule (180 mg total) by mouth daily.  90 capsule  3  . fish oil-omega-3 fatty acids 1000 MG capsule Take 1 capsule by mouth 2 (two) times daily.        . hydrochlorothiazide (MICROZIDE) 12.5 MG capsule TAKE ONE CAPSULE BY MOUTH DAILY AS NEEDED FOR EDEMA  30 capsule  4  . Multiple Vitamin (MULTIVITAMIN) tablet Take 1 tablet by mouth daily.        Marland Kitchen omeprazole (PRILOSEC) 20 MG capsule Take 20 mg by mouth daily.        . pravastatin (PRAVACHOL) 80 MG tablet Take 1 tablet (80 mg total) by mouth daily.  30 tablet  6  . furosemide (LASIX) 20 MG tablet Take 1 tablet (20 mg total) by mouth daily.  30 tablet  3  . potassium chloride (K-DUR) 10 MEQ tablet Take 1 tablet (10 mEq total) by mouth daily.  30 tablet  3    Past Medical History  Diagnosis Date  . Chest pain     Normal coronary angiography in 1994 and 2011  . LBBB (left bundle branch block) 2011  . Ventricular tachycardia     Exercise induced  . Hyperlipidemia   . Hypertension   . Dizziness   . Syncope   . Palpitations   . Atrial septal defect     Surgical repair in 1994    Past Surgical History  Procedure Date  . Asd repair 1994    [facility]  . Breast excisional biopsy     Right and left  .  Tonsillectomy   . Tubal ligation     Bilateral    ZOX:[id number] of systems complete and found to be negative unless listed above  PHYSICAL EXAM BP 148/87  Pulse 95  Resp 18  Ht 5\' 6"  (1.676 m)  Wt 173 lb (78.472 kg)  BMI 27.92 kg/m2  General: Well developed, well nourished, in no acute distress Head: Eyes PERRLA, No xanthomas.   Normal cephalic and atramatic Lungs: Mild inspiratory wheezes are noted, breath sounds are otherwise clear. Heart: HRRR S1 S2, S3 gallop, slightly tachycardic..  Pulses are 2+ & equal.            No carotid bruit. No JVD.  No abdominal bruits. No femoral bruits. Abdomen: Bowel sounds are positive, abdomen soft and non-tender without masses or                  Hernia's noted. Msk:  Back normal, normal gait. Normal strength and tone for age. Extremities: No clubbing, cyanosis 1+  Right leg edema, 2+left leg edema.   DP +1 Neuro: Alert and oriented X 3. Psych:  Good affect, responds  appropriately  VHQ:IONGE rhythm with 1st degree AV block rate of 96 bpm.  ASSESSMENT AND PLAN

## 2012-02-08 NOTE — Patient Instructions (Signed)
**Note De-Identified Virgina Deakins Obfuscation** Your physician has requested that you have an echocardiogram. Echocardiography is a painless test that uses sound waves to create images of your heart. It provides your doctor with information about the size and shape of your heart and how well your heart's chambers and valves are working. This procedure takes approximately one hour. There are no restrictions for this procedure.  A chest x-ray takes a picture of the organs and structures inside the chest, including the heart, lungs, and blood vessels. This test can show several things, including, whether the heart is enlarges; whether fluid is building up in the lungs; and whether pacemaker / defibrillator leads are still in place.  Your physician has recommended you make the following change in your medication: start taking Lasix (Furosemide) 20 mg daily and Potassium 10 meq. Daily  Your physician recommends that you return for lab work in: today  Your physician recommends that you schedule a follow-up appointment in: 1 week

## 2012-02-08 NOTE — Assessment & Plan Note (Signed)
She has been complaining of this with worsening symptoms for 2 weeks. She has some left foot and pre-tibial edema slightly more than on the right. She is comfortable talking to me and is not short of breath. Abdomen does not appear significantly distended. EKG does not show any ischemia. She has an S3 murmur.  I will have labs drawn to include D-Dimer, CBC, CMET, BNP to evaluate further. She will have a chest x-ray to rule out CHF. Echo will be completed for LV fx.  I have given he a Rx for lasix 20 mg po with potassium 10 mEq daily. She is on HCTZ as well and do not want to deplete her potassium stores. She will return in a week to discuss test results unless they are critical. Will follow-up.

## 2012-02-09 LAB — CBC WITH DIFFERENTIAL/PLATELET
Basophils Absolute: 0 10*3/uL (ref 0.0–0.1)
Basophils Relative: 0 % (ref 0–1)
Eosinophils Absolute: 0.2 10*3/uL (ref 0.0–0.7)
Eosinophils Relative: 2 % (ref 0–5)
HCT: 38.2 % (ref 36.0–46.0)
Hemoglobin: 12.7 g/dL (ref 12.0–15.0)
Lymphocytes Relative: 29 % (ref 12–46)
Lymphs Abs: 2.9 10*3/uL (ref 0.7–4.0)
MCH: 27.9 pg (ref 26.0–34.0)
MCHC: 33.2 g/dL (ref 30.0–36.0)
MCV: 84 fL (ref 78.0–100.0)
Monocytes Absolute: 0.6 10*3/uL (ref 0.1–1.0)
Monocytes Relative: 6 % (ref 3–12)
Neutro Abs: 6.3 10*3/uL (ref 1.7–7.7)
Neutrophils Relative %: 63 % (ref 43–77)
Platelets: 611 10*3/uL — ABNORMAL HIGH (ref 150–400)
RBC: 4.55 MIL/uL (ref 3.87–5.11)
RDW: 14 % (ref 11.5–15.5)
WBC: 10 10*3/uL (ref 4.0–10.5)

## 2012-02-09 LAB — D-DIMER, QUANTITATIVE: D-Dimer, Quant: 0.75 ug/mL-FEU — ABNORMAL HIGH (ref 0.00–0.48)

## 2012-02-09 LAB — COMPREHENSIVE METABOLIC PANEL
ALT: 11 U/L (ref 0–35)
AST: 13 U/L (ref 0–37)
Albumin: 4.2 g/dL (ref 3.5–5.2)
Alkaline Phosphatase: 78 U/L (ref 39–117)
BUN: 17 mg/dL (ref 6–23)
CO2: 26 mEq/L (ref 19–32)
Calcium: 9.8 mg/dL (ref 8.4–10.5)
Chloride: 104 mEq/L (ref 96–112)
Creat: 0.79 mg/dL (ref 0.50–1.10)
Glucose, Bld: 105 mg/dL — ABNORMAL HIGH (ref 70–99)
Potassium: 4.7 mEq/L (ref 3.5–5.3)
Sodium: 142 mEq/L (ref 135–145)
Total Bilirubin: 0.5 mg/dL (ref 0.3–1.2)
Total Protein: 6.5 g/dL (ref 6.0–8.3)

## 2012-02-09 LAB — BRAIN NATRIURETIC PEPTIDE: Brain Natriuretic Peptide: 260.8 pg/mL — ABNORMAL HIGH (ref 0.0–100.0)

## 2012-02-13 ENCOUNTER — Ambulatory Visit (HOSPITAL_COMMUNITY)
Admission: RE | Admit: 2012-02-13 | Discharge: 2012-02-13 | Disposition: A | Discharge status: Home / Self Care | Payer: Medicare Other | Source: Ambulatory Visit | Attending: Adult Health | Admitting: Adult Health

## 2012-02-13 DIAGNOSIS — Q2111 Secundum atrial septal defect: Secondary | ICD-10-CM | POA: Insufficient documentation

## 2012-02-13 DIAGNOSIS — E785 Hyperlipidemia, unspecified: Secondary | ICD-10-CM | POA: Insufficient documentation

## 2012-02-13 DIAGNOSIS — I10 Essential (primary) hypertension: Secondary | ICD-10-CM | POA: Insufficient documentation

## 2012-02-13 DIAGNOSIS — M6289 Other specified disorders of muscle: Secondary | ICD-10-CM

## 2012-02-13 DIAGNOSIS — I059 Rheumatic mitral valve disease, unspecified: Secondary | ICD-10-CM

## 2012-02-13 DIAGNOSIS — R079 Chest pain, unspecified: Secondary | ICD-10-CM

## 2012-02-13 DIAGNOSIS — Q211 Atrial septal defect: Secondary | ICD-10-CM | POA: Insufficient documentation

## 2012-02-13 NOTE — Progress Notes (Signed)
*  PRELIMINARY RESULTS* Echocardiogram 2D Echocardiogram has been performed.  Conrad Frazee 02/13/2012, 11:40 AM

## 2012-02-15 ENCOUNTER — Encounter: Payer: Self-pay | Admitting: Adult Health

## 2012-02-15 ENCOUNTER — Ambulatory Visit (INDEPENDENT_AMBULATORY_CARE_PROVIDER_SITE_OTHER): Payer: Medicare Other | Admitting: Adult Health

## 2012-02-15 VITALS — BP 143/80 | HR 91 | Ht 66.0 in | Wt 176.0 lb

## 2012-02-15 DIAGNOSIS — E785 Hyperlipidemia, unspecified: Secondary | ICD-10-CM

## 2012-02-15 DIAGNOSIS — I10 Essential (primary) hypertension: Secondary | ICD-10-CM

## 2012-02-15 DIAGNOSIS — R079 Chest pain, unspecified: Secondary | ICD-10-CM

## 2012-02-15 MED ORDER — LISINOPRIL 10 MG PO TABS: 10.0000 mg | ORAL_TABLET | Freq: Every day | ORAL | Status: DC

## 2012-02-15 MED ORDER — METOPROLOL SUCCINATE ER 50 MG PO TB24: 50.0000 mg | ORAL_TABLET | Freq: Every day | ORAL | Status: DC

## 2012-02-15 NOTE — Assessment & Plan Note (Signed)
Denies chest pain at this time. Only complaint is tachycardia intermittently. Hopefully BB will assist with symptoms.

## 2012-02-15 NOTE — Progress Notes (Signed)
HPI: Heather Barber is a 66 y/o patient of Dr. Dietrich Pates we are seeing for ongoing assessment and treatment of hyperlipidemia, hypertension, with h/o Atrail Septal defect. She has complaints of LEE L>R, PND and orthopnea, and feeling of fullness in her abdomen. She has had worsening symptoms of this for 2 weeks. She denies chest pain and but has class II symptoms of dyspnea chronically She has also had some complaints of "heart racing" at night waking her up.  On last visit, I ordered Echo, labs and prescribed lasix 20 mg daily to assist with LEE. She comes today feeling some better concerning the fluid retention despite wt gain of 3 lbs. She still has some nocturnal  tachycardia symptoms.   Allergies  Allergen Reactions  . Morphine     REACTION: GI symptoms    Current Outpatient Prescriptions  Medication Sig Dispense Refill  . aspirin 81 MG tablet Take 81 mg by mouth daily.        . fish oil-omega-3 fatty acids 1000 MG capsule Take 1 capsule by mouth 2 (two) times daily.        . furosemide (LASIX) 20 MG tablet Take 1 tablet (20 mg total) by mouth daily.  30 tablet  3  . hydrochlorothiazide (MICROZIDE) 12.5 MG capsule TAKE ONE CAPSULE BY MOUTH DAILY AS NEEDED FOR EDEMA  30 capsule  4  . Multiple Vitamin (MULTIVITAMIN) tablet Take 1 tablet by mouth daily.        Marland Kitchen omeprazole (PRILOSEC) 20 MG capsule Take 20 mg by mouth daily.        . potassium chloride (K-DUR) 10 MEQ tablet Take 1 tablet (10 mEq total) by mouth daily.  30 tablet  3  . pravastatin (PRAVACHOL) 80 MG tablet Take 1 tablet (80 mg total) by mouth daily.  30 tablet  6  . lisinopril (PRINIVIL,ZESTRIL) 10 MG tablet Take 1 tablet (10 mg total) by mouth daily.  30 tablet  3  . metoprolol succinate (TOPROL-XL) 50 MG 24 hr tablet Take 1 tablet (50 mg total) by mouth daily. Take with or immediately following a meal.  30 tablet  3    Past Medical History  Diagnosis Date  . Chest pain     Normal coronary angiography in 1994 and 2011  .  LBBB (left bundle branch block) 2011  . Ventricular tachycardia     Exercise induced  . Hyperlipidemia   . Hypertension   . Dizziness   . Syncope   . Palpitations   . Atrial septal defect     Surgical repair in 1994    Past Surgical History  Procedure Date  . Asd repair 1994    [facility]  . Breast excisional biopsy     Right and left  . Tonsillectomy   . Tubal ligation     Bilateral    [id number]:[id number] of systems complete and found to be negative unless listed above  PHYSICAL EXAM BP 143/80  Pulse 91  Ht 5\' 6"  (1.676 m)  Wt 176 lb (79.833 kg)  BMI 28.41 kg/m2  General: Well developed, well nourished, in no acute distress Head: Eyes PERRLA, No xanthomas.   Normal cephalic and atramatic Lungs: Mild inspiratory wheezes are noted, breath sounds are otherwise clear. Heart: HRRR S1 S2, S3 gallop, slightly tachycardic..  Pulses are 2+ & equal.            No carotid bruit. No JVD.  No abdominal bruits. No femoral bruits. Abdomen: Bowel sounds are  positive, abdomen soft and non-tender without masses or                  Hernia's noted. Msk:  Back normal, normal gait. Normal strength and tone for age. Extremities: No clubbing, cyanosis 1+  Right leg edema, 1+left leg edema.   DP +1 Neuro: Alert and oriented X 3. Psych:  Good affect, responds appropriately  WUJ:WJXBJ rhythm with 1st degree AV block rate of 96 bpm.  ASSESSMENT AND PLAN

## 2012-02-15 NOTE — Patient Instructions (Signed)
**Note De-Identified Margaretann Abate Obfuscation** Your physician recommends that you return for lab work in: 2 months  Your physician has recommended you make the following change in your medication: stop taking Cardiazem and start taking Metoprolol XL 50 mg daily and Lisinopril 10 mg daily  Your physician recommends that you schedule a follow-up appointment in: 3 months

## 2012-02-15 NOTE — Assessment & Plan Note (Signed)
Will continue to follow labs every 6 months.

## 2012-02-15 NOTE — Assessment & Plan Note (Signed)
Echocardiogram is concerning as it shows EF of 40-45% which is reduced from cardiac cath in 2011 revealing normal EF. She remains mildly hypertensive on this appointment with a 3 lb wt gain.  I have discussed this with Dr. Dietrich Pates concerning medication changes I wish to make. I do not want her to take CCB at this time with reduced LV fx.   Will start her on lisinopril 10 mg daily and metoprolol 50 mg XL daily. I will d/c the cardiazem. She will continue the lasix 20 mg daily. If she continues to have fluid retention or increased symptoms of tachycardia, or chest pain she is to call us. Follow-up labs in a couple of months for reassessment of her renal status which was normal on recent labs.

## 2012-04-02 ENCOUNTER — Other Ambulatory Visit: Payer: Self-pay | Admitting: Adult Health

## 2012-04-02 LAB — BASIC METABOLIC PANEL
BUN: 22 mg/dL (ref 6–23)
CO2: 31 mEq/L (ref 19–32)
Calcium: 10.1 mg/dL (ref 8.4–10.5)
Chloride: 100 mEq/L (ref 96–112)
Creat: 0.8 mg/dL (ref 0.50–1.10)
Glucose, Bld: 93 mg/dL (ref 70–99)
Potassium: 5.8 mEq/L — ABNORMAL HIGH (ref 3.5–5.3)
Sodium: 141 mEq/L (ref 135–145)

## 2012-04-04 ENCOUNTER — Other Ambulatory Visit: Payer: Self-pay

## 2012-04-04 MED ORDER — POTASSIUM CHLORIDE ER 10 MEQ PO TBCR: 10.0000 meq | EXTENDED_RELEASE_TABLET | ORAL | Status: DC

## 2012-05-22 ENCOUNTER — Ambulatory Visit (INDEPENDENT_AMBULATORY_CARE_PROVIDER_SITE_OTHER): Payer: Medicare Other | Admitting: Cardiology

## 2012-05-22 ENCOUNTER — Encounter: Payer: Self-pay | Admitting: Cardiology

## 2012-05-22 VITALS — BP 120/70 | HR 75 | Ht 63.0 in | Wt 154.0 lb

## 2012-05-22 DIAGNOSIS — Q211 Atrial septal defect, unspecified: Secondary | ICD-10-CM

## 2012-05-22 DIAGNOSIS — I10 Essential (primary) hypertension: Secondary | ICD-10-CM

## 2012-05-22 DIAGNOSIS — E785 Hyperlipidemia, unspecified: Secondary | ICD-10-CM

## 2012-05-22 DIAGNOSIS — Q2111 Secundum atrial septal defect: Secondary | ICD-10-CM

## 2012-05-22 MED ORDER — LOSARTAN POTASSIUM 50 MG PO TABS: 50.0000 mg | ORAL_TABLET | Freq: Every day | ORAL | Status: DC

## 2012-05-22 NOTE — Assessment & Plan Note (Signed)
Surgical repair was performed relatively late in life when patient was 66 years of age.  It is entirely possible that there is a degree of fixed residual pulmonary hypertension, but this has not appeared to be severe based upon the patient's symptoms and previous imaging studies.

## 2012-05-22 NOTE — Assessment & Plan Note (Signed)
Lipid profile less than one year ago revealed reasonable total and HDL cholesterol but markedly elevated triglycerides resulting in inability to estimate LDL.  A repeat profile will be obtained with a direct measurement of LDL.  Non-HDL cholesterol is moderately elevated.

## 2012-05-22 NOTE — Patient Instructions (Addendum)
Your physician recommends that you schedule a follow-up appointment in: 8 months  Your physician recommends that you return for lab work in: 1 month (you will receive a reminder letter)  Call for systolic blood pressure reading (upper number) of more than 140  Your physician has recommended you make the following change in your medication:  1 - STOP Potassium 2 - STOP Lisinopril 3 - START Losartan 50 mg daily

## 2012-05-22 NOTE — Progress Notes (Deleted)
Name: Heather Barber    DOB: 09/02/1946  Age: 66 y.o.  MR#: [id number]       PCP:  Josue Hector, MD      Insurance: @PAYORNAME @   CC:    Chief Complaint  Patient presents with  . Hypertension    No complaints - Bottles - TC  . Hyperlipidemia    VS BP 120/70  Pulse 75  Ht 5\' 3"  (1.6 m)  Wt 154 lb (69.854 kg)  BMI 27.28 kg/m2  SpO2 98%  Weights Current Weight  05/22/12 154 lb (69.854 kg)  02/15/12 176 lb (79.833 kg)  02/08/12 173 lb (78.472 kg)    Blood Pressure  BP Readings from Last 3 Encounters:  05/22/12 120/70  02/15/12 143/80  02/08/12 148/87     Admit date:  (Not on file) Last encounter with RMR:  Visit date not found   Allergy Allergies  Allergen Reactions  . Morphine     REACTION: GI symptoms    Current Outpatient Prescriptions  Medication Sig Dispense Refill  . aspirin 81 MG tablet Take 81 mg by mouth daily.        . fish oil-omega-3 fatty acids 1000 MG capsule Take 1 capsule by mouth 2 (two) times daily.        . furosemide (LASIX) 20 MG tablet Take 1 tablet (20 mg total) by mouth daily.  30 tablet  3  . hydrochlorothiazide (MICROZIDE) 12.5 MG capsule TAKE ONE CAPSULE BY MOUTH DAILY AS NEEDED FOR EDEMA  30 capsule  4  . lisinopril (PRINIVIL,ZESTRIL) 10 MG tablet Take 1 tablet (10 mg total) by mouth daily.  30 tablet  3  . metoprolol succinate (TOPROL-XL) 50 MG 24 hr tablet Take 1 tablet (50 mg total) by mouth daily. Take with or immediately following a meal.  30 tablet  3  . Multiple Vitamin (MULTIVITAMIN) tablet Take 1 tablet by mouth daily.        Marland Kitchen omeprazole (PRILOSEC) 20 MG capsule Take 20 mg by mouth daily.        . potassium chloride (K-DUR) 10 MEQ tablet Take 1 tablet (10 mEq total) by mouth every other day.  30 tablet  0  . pravastatin (PRAVACHOL) 80 MG tablet Take 1 tablet (80 mg total) by mouth daily.  30 tablet  6    Discontinued Meds:   There are no discontinued medications.  Patient Active Problem List  Diagnosis  .  HYPERLIPIDEMIA  . CHEST PAIN  . LBBB (left bundle branch block)  . Ventricular tachycardia  . Atrial septal defect  . Hypertension  . Fluid retention in tissues    LABS Orders Only on 04/02/2012  Component Date Value  . Sodium 04/02/2012 141   . Potassium 04/02/2012 5.8*  . Chloride 04/02/2012 100   . CO2 04/02/2012 31   . Glucose, Bld 04/02/2012 93   . BUN 04/02/2012 22   . Creat 04/02/2012 0.80   . Calcium 04/02/2012 10.1      Results for this Opt Visit:     Results for orders placed in visit on 04/02/12  BASIC METABOLIC PANEL      Component Value Range   Sodium 141  135 - 145 mEq/L   Potassium 5.8 (*) 3.5 - 5.3 mEq/L   Chloride 100  96 - 112 mEq/L   CO2 31  19 - 32 mEq/L   Glucose, Bld 93  70 - 99 mg/dL   BUN 22  6 - 23 mg/dL  Creat 0.80  0.50 - 1.10 mg/dL   Calcium 16.1  8.4 - 09.6 mg/dL    EKG Orders placed in visit on 02/08/12  . EKG 12-LEAD     Prior Assessment and Plan Problem List as of 05/22/2012            Cardiology Problems   HYPERLIPIDEMIA   Last Assessment & Plan Note   02/15/2012 Office Visit Signed 02/15/2012  4:25 PM by Jodelle Gross, NP    Will continue to follow labs every 6 months.    LBBB (left bundle branch block)   Ventricular tachycardia   Atrial septal defect   Last Assessment & Plan Note   07/13/2011 Office Visit Signed 07/13/2011  2:08 PM by Kathlen Brunswick, MD    Ms. Flippen has had an excellent result from surgical closure of an ASD nearly 20 years ago.  She has no findings on exam to suggest a shunt at present, no evidence for pulmonary hypertension and has not developed atrial arrhythmias.    Hypertension   Last Assessment & Plan Note   02/15/2012 Office Visit Signed 02/15/2012  4:25 PM by Jodelle Gross, NP    Echocardiogram is concerning as it shows EF of 40-45% which is reduced from cardiac cath in 2011 revealing normal EF. She remains mildly hypertensive on this appointment with a 3 lb wt gain.  I have discussed  this with Dr. Dietrich Pates concerning medication changes I wish to make. I do not want her to take CCB at this time with reduced LV fx.   Will start her on lisinopril 10 mg daily and metoprolol 50 mg XL daily. I will d/c the cardiazem. She will continue the lasix 20 mg daily. If she continues to have fluid retention or increased symptoms of tachycardia, or chest pain she is to call us. Follow-up labs in a couple of months for reassessment of her renal status which was normal on recent labs.       Other   CHEST PAIN   Last Assessment & Plan Note   02/15/2012 Office Visit Signed 02/15/2012  4:26 PM by Jodelle Gross, NP    Denies chest pain at this time. Only complaint is tachycardia intermittently. Hopefully BB will assist with symptoms.    Fluid retention in tissues   Last Assessment & Plan Note   02/08/2012 Office Visit Signed 02/08/2012  3:43 PM by Jodelle Gross, NP    She has been complaining of this with worsening symptoms for 2 weeks. She has some left foot and pre-tibial edema slightly more than on the right. She is comfortable talking to me and is not short of breath. Abdomen does not appear significantly distended. EKG does not show any ischemia. She has an S3 murmur.  I will have labs drawn to include D-Dimer, CBC, CMET, BNP to evaluate further. She will have a chest x-ray to rule out CHF. Echo will be completed for LV fx.  I have given he a Rx for lasix 20 mg po with potassium 10 mEq daily. She is on HCTZ as well and do not want to deplete her potassium stores. She will return in a week to discuss test results unless they are critical. Will follow-up.         Imaging: No results found.   FRS Calculation: Score not calculated. Missing: Total Cholesterol

## 2012-05-22 NOTE — Progress Notes (Signed)
[name], female   DOB: 1946-05-29, 66 y.o.   MRN: [id number]  HPI: Scheduled return visit for this nice woman with hypertension, remote repair of ASD and hyperlipidemia.  Since her last visit, she has done exceedingly well.  She has not developed any new medical problems nor required care in hospital or in the emergency department.  She has restricted calories and lost a significant amount of weight.  Blood pressure control has been excellent when assessed at home with systolics typically below 130 and diastolics below 80.  Patient reports a nagging nonproductive cough that developed subsequent to her most recent office visit.  Recent metabolic profiles have demonstrated hyperkalemia, most recently with a level of 5.8.  Prior to Admission medications   Medication Sig Start Date End Date Taking? Authorizing Provider  aspirin 81 MG tablet Take 81 mg by mouth daily.     Yes Historical Provider, MD  fish oil-omega-3 fatty acids 1000 MG capsule Take 1 capsule by mouth 2 (two) times daily.     Yes Historical Provider, MD  furosemide (LASIX) 20 MG tablet Take 1 tablet (20 mg total) by mouth daily. 02/08/12 02/07/13 Yes Jodelle Gross, NP  hydrochlorothiazide (MICROZIDE) 12.5 MG capsule TAKE ONE CAPSULE BY MOUTH DAILY AS NEEDED FOR EDEMA 01/30/12  Yes Jodelle Gross, NP  metoprolol succinate (TOPROL-XL) 50 MG 24 hr tablet Take 1 tablet (50 mg total) by mouth daily. Take with or immediately following a meal. 02/15/12 02/14/13 Yes Jodelle Gross, NP  Multiple Vitamin (MULTIVITAMIN) tablet Take 1 tablet by mouth daily.     Yes Historical Provider, MD  omeprazole (PRILOSEC) 20 MG capsule Take 20 mg by mouth daily.     Yes Historical Provider, MD  pravastatin (PRAVACHOL) 80 MG tablet Take 1 tablet (80 mg total) by mouth daily. 08/08/11  Yes Kathlen Brunswick, MD  losartan (COZAAR) 50 MG tablet Take 1 tablet (50 mg total) by mouth daily. 05/22/12 05/22/13  Kathlen Brunswick, MD   Allergies    Allergen Reactions  . Morphine     REACTION: GI symptoms     Past medical history, social history, and family history reviewed and updated.  ROS: Denies chest discomfort, dyspnea, orthopnea, PND, lightheadedness or syncope.  She is noting no pedal edema.  She experiences palpitations when lying on her left side at night in bed.  All other systems reviewed and are negative.  PHYSICAL EXAM: BP 120/70  Pulse 75  Ht 5\' 3"  (1.6 m)  Wt 69.854 kg (154 lb)  BMI 27.28 kg/m2  SpO2 98%  General-Well developed; no acute distress Body habitus-proportionate weight and height Neck-No JVD; no carotid bruits Lungs-clear lung fields; resonant to percussion Cardiovascular-normal PMI; normal S1, increased intensity of S2; grade 1-2/6 basilar systolic murmur Abdomen-normal bowel sounds; soft and non-tender without masses or organomegaly Musculoskeletal-No deformities, no cyanosis or clubbing Neurologic-Normal cranial nerves; symmetric strength and tone Skin-Warm, no significant lesions Extremities-distal pulses intact; no edema  ASSESSMENT AND PLAN:  [place] Bing, MD 05/22/2012 7:54 PM

## 2012-05-22 NOTE — Assessment & Plan Note (Signed)
Systolic blood pressure has been inadequately controlled for sometime, but recent values are excellent.  Adverse effects related to lisinopril include cough and likely hyperkalemia.  Losartan 50 mg per day will be substituted.  Patient will report elevated blood pressures, if any.  Low potassium diet prescribed, and chemistry profile will be repeated in one month.

## 2012-05-24 ENCOUNTER — Encounter: Payer: Self-pay | Admitting: *Deleted

## 2012-06-04 ENCOUNTER — Other Ambulatory Visit: Payer: Self-pay | Admitting: Adult Health

## 2012-06-11 ENCOUNTER — Other Ambulatory Visit: Payer: Self-pay | Admitting: Adult Health

## 2012-06-18 ENCOUNTER — Other Ambulatory Visit (HOSPITAL_COMMUNITY): Payer: Self-pay | Admitting: General Surgery

## 2012-06-18 DIAGNOSIS — Z139 Encounter for screening, unspecified: Secondary | ICD-10-CM

## 2012-06-20 ENCOUNTER — Ambulatory Visit (HOSPITAL_COMMUNITY)
Admission: RE | Admit: 2012-06-20 | Discharge: 2012-06-20 | Disposition: A | Discharge status: Home / Self Care | Payer: Medicare Other | Source: Ambulatory Visit | Attending: General Surgery | Admitting: General Surgery

## 2012-06-20 ENCOUNTER — Other Ambulatory Visit: Payer: Self-pay | Admitting: Adult Health

## 2012-06-20 DIAGNOSIS — Z139 Encounter for screening, unspecified: Secondary | ICD-10-CM

## 2012-06-20 DIAGNOSIS — Z1231 Encounter for screening mammogram for malignant neoplasm of breast: Secondary | ICD-10-CM | POA: Insufficient documentation

## 2012-06-21 LAB — BASIC METABOLIC PANEL
BUN: 18 mg/dL (ref 6–23)
CO2: 29 mEq/L (ref 19–32)
Calcium: 10.1 mg/dL (ref 8.4–10.5)
Chloride: 101 mEq/L (ref 96–112)
Creat: 0.85 mg/dL (ref 0.50–1.10)
Glucose, Bld: 114 mg/dL — ABNORMAL HIGH (ref 70–99)
Potassium: 5.2 mEq/L (ref 3.5–5.3)
Sodium: 139 mEq/L (ref 135–145)

## 2012-06-24 ENCOUNTER — Other Ambulatory Visit: Payer: Self-pay | Admitting: *Deleted

## 2012-06-24 ENCOUNTER — Encounter: Payer: Self-pay | Admitting: *Deleted

## 2012-06-24 DIAGNOSIS — I10 Essential (primary) hypertension: Secondary | ICD-10-CM

## 2012-06-27 ENCOUNTER — Telehealth: Payer: Self-pay | Admitting: Cardiology

## 2012-06-27 NOTE — Telephone Encounter (Signed)
Patient called to state that she went on 06/20/12 and had lab work completed.

## 2012-07-05 ENCOUNTER — Telehealth: Payer: Self-pay | Admitting: Cardiology

## 2012-07-05 NOTE — Telephone Encounter (Signed)
PT CALLING FOR LAB RESULTS DONE AROUND 06/22/12

## 2012-07-05 NOTE — Telephone Encounter (Signed)
Normal results called to patients

## 2012-08-07 ENCOUNTER — Other Ambulatory Visit: Payer: Self-pay | Admitting: Obstetrics and Gynecology

## 2012-08-07 ENCOUNTER — Other Ambulatory Visit (HOSPITAL_COMMUNITY)
Admission: RE | Admit: 2012-08-07 | Discharge: 2012-08-07 | Disposition: A | Discharge status: Home / Self Care | Payer: Medicare Other | Source: Ambulatory Visit | Attending: Obstetrics and Gynecology | Admitting: Obstetrics and Gynecology

## 2012-08-07 DIAGNOSIS — Z1151 Encounter for screening for human papillomavirus (HPV): Secondary | ICD-10-CM | POA: Insufficient documentation

## 2012-08-07 DIAGNOSIS — Z124 Encounter for screening for malignant neoplasm of cervix: Secondary | ICD-10-CM | POA: Insufficient documentation

## 2012-08-07 DIAGNOSIS — R87810 Cervical high risk human papillomavirus (HPV) DNA test positive: Secondary | ICD-10-CM | POA: Insufficient documentation

## 2012-09-06 ENCOUNTER — Other Ambulatory Visit: Payer: Self-pay | Admitting: Cardiology

## 2012-09-06 MED ORDER — FUROSEMIDE 20 MG PO TABS: 20.0000 mg | ORAL_TABLET | Freq: Every day | ORAL | Status: DC

## 2012-09-17 ENCOUNTER — Other Ambulatory Visit: Payer: Self-pay | Admitting: Cardiology

## 2012-09-17 MED ORDER — METOPROLOL SUCCINATE ER 50 MG PO TB24: 50.0000 mg | ORAL_TABLET | Freq: Every day | ORAL | Status: DC

## 2012-10-09 ENCOUNTER — Telehealth: Payer: Self-pay | Admitting: Cardiology

## 2012-10-09 NOTE — Telephone Encounter (Signed)
Patient called stating she had a message someone called her.  Reviewed and CMA had contacted patient regarding a medication refill request.  Patient was advised someone would contact her back.  Patient requests call back tomorrow 10/10/2012 after 1:00pm.

## 2012-10-09 NOTE — Telephone Encounter (Signed)
Received refill request for Cardizem 180 MG according to our records pt was suppose to stop medication 02-15-2012. Called and left message for pt to call me back at [phone] that we needed to verify if she was taking one of her medications.

## 2012-10-10 ENCOUNTER — Other Ambulatory Visit: Payer: Self-pay | Admitting: Cardiology

## 2012-10-10 NOTE — Telephone Encounter (Signed)
Called pt around 4:00 PM 12-12 and there was no answer will try again tomorrow.

## 2012-10-10 NOTE — Telephone Encounter (Signed)
Pt verified that she has stopped taking Cardizem. Refill will not be sent into pharmacy

## 2012-10-29 ENCOUNTER — Telehealth: Payer: Self-pay | Admitting: Cardiology

## 2012-10-29 NOTE — Telephone Encounter (Signed)
Informed pharmacy that Diltiazem medication has been d/c.

## 2012-11-14 ENCOUNTER — Other Ambulatory Visit: Payer: Self-pay | Admitting: Cardiology

## 2012-11-14 MED ORDER — PRAVASTATIN SODIUM 80 MG PO TABS: 80.0000 mg | ORAL_TABLET | Freq: Every day | ORAL | Status: DC

## 2012-12-09 ENCOUNTER — Other Ambulatory Visit: Payer: Self-pay | Admitting: *Deleted

## 2012-12-09 MED ORDER — FUROSEMIDE 20 MG PO TABS: 20.0000 mg | ORAL_TABLET | Freq: Every day | ORAL | Status: DC

## 2012-12-19 ENCOUNTER — Other Ambulatory Visit: Payer: Self-pay | Admitting: Emergency Medicine

## 2012-12-19 MED ORDER — METOPROLOL SUCCINATE ER 50 MG PO TB24: 50.0000 mg | ORAL_TABLET | Freq: Every day | ORAL | Status: DC

## 2013-01-16 ENCOUNTER — Ambulatory Visit: Payer: Medicare Other | Admitting: Cardiology

## 2013-02-20 ENCOUNTER — Encounter: Payer: Self-pay | Admitting: Cardiology

## 2013-02-20 ENCOUNTER — Ambulatory Visit (INDEPENDENT_AMBULATORY_CARE_PROVIDER_SITE_OTHER): Payer: Medicare Other | Admitting: Cardiology

## 2013-02-20 VITALS — BP 110/68 | HR 74 | Ht 64.0 in | Wt 169.1 lb

## 2013-02-20 DIAGNOSIS — Q211 Atrial septal defect, unspecified: Secondary | ICD-10-CM

## 2013-02-20 DIAGNOSIS — E785 Hyperlipidemia, unspecified: Secondary | ICD-10-CM

## 2013-02-20 DIAGNOSIS — I472 Ventricular tachycardia, unspecified: Secondary | ICD-10-CM

## 2013-02-20 DIAGNOSIS — Q2111 Secundum atrial septal defect: Secondary | ICD-10-CM

## 2013-02-20 DIAGNOSIS — I4729 Other ventricular tachycardia: Secondary | ICD-10-CM

## 2013-02-20 DIAGNOSIS — I10 Essential (primary) hypertension: Secondary | ICD-10-CM

## 2013-02-20 DIAGNOSIS — I447 Left bundle-branch block, unspecified: Secondary | ICD-10-CM

## 2013-02-20 NOTE — Patient Instructions (Addendum)
Your physician wants you to follow-up in: 1 year. You will receive a reminder letter in the mail two months in advance. If you don't receive a letter, please call our office to schedule the follow-up appointment.  Your physician recommends that you return for fasting lab work: cmp, lipid profile.  We will call you with your results  Your doctor has recommended that you follow a diet low in potassium. A handout has been given to you today

## 2013-02-20 NOTE — Progress Notes (Deleted)
Name: Heather Barber    DOB: 1945/11/28  Age: 67 y.o.  MR#: [id number]       PCP:  Josue Hector, MD      Insurance: Payor: MEDICARE  Plan: MEDICARE PART A AND B  Product Type: *No Product type*    CC:   No chief complaint on file.   medication list VS Filed Vitals:   02/20/13 1511  Height: 5\' 4"  (1.626 m)  Weight: 169 lb 1.9 oz (76.712 kg)    Weights Current Weight  02/20/13 169 lb 1.9 oz (76.712 kg)  05/22/12 154 lb (69.854 kg)  02/15/12 176 lb (79.833 kg)    Blood Pressure  BP Readings from Last 3 Encounters:  05/22/12 120/70  02/15/12 143/80  02/08/12 148/87     Admit date:  (Not on file) Last encounter with RMR:  01/16/2013   Allergy Morphine  Current Outpatient Prescriptions  Medication Sig Dispense Refill  . aspirin 81 MG tablet Take 81 mg by mouth daily.        . fish oil-omega-3 fatty acids 1000 MG capsule Take 1 capsule by mouth 2 (two) times daily.        . furosemide (LASIX) 20 MG tablet Take 1 tablet (20 mg total) by mouth daily.  30 tablet  2  . hydrochlorothiazide (MICROZIDE) 12.5 MG capsule TAKE ONE CAPSULE BY MOUTH DAILY AS NEEDED FOR EDEMA  30 capsule  4  . KLOR-CON M10 10 MEQ tablet TAKE ONE TABLET BY MOUTH ONE TIME DAILY  30 tablet  2  . losartan (COZAAR) 50 MG tablet Take 1 tablet (50 mg total) by mouth daily.  30 tablet  12  . metoprolol succinate (TOPROL-XL) 50 MG 24 hr tablet Take 1 tablet (50 mg total) by mouth daily. Take with or immediately following a meal.  30 tablet  2  . Multiple Vitamin (MULTIVITAMIN) tablet Take 1 tablet by mouth daily.        Marland Kitchen omeprazole (PRILOSEC) 20 MG capsule Take 20 mg by mouth daily.        . pravastatin (PRAVACHOL) 80 MG tablet Take 1 tablet (80 mg total) by mouth daily.  30 tablet  6   No current facility-administered medications for this visit.    Discontinued Meds:   There are no discontinued medications.  Patient Active Problem List  Diagnosis  . HYPERLIPIDEMIA  . LBBB (left bundle branch block)   . Ventricular tachycardia  . Atrial septal defect  . Hypertension    LABS    Component Value Date/Time   NA 139 06/20/2012 1518   NA 141 04/02/2012 0810   NA 142 02/08/2012 1530   K 5.2 06/20/2012 1518   K 5.8* 04/02/2012 0810   K 4.7 02/08/2012 1530   CL 101 06/20/2012 1518   CL 100 04/02/2012 0810   CL 104 02/08/2012 1530   CO2 29 06/20/2012 1518   CO2 31 04/02/2012 0810   CO2 26 02/08/2012 1530   GLUCOSE 114* 06/20/2012 1518   GLUCOSE 93 04/02/2012 0810   GLUCOSE 105* 02/08/2012 1530   BUN 18 06/20/2012 1518   BUN 22 04/02/2012 0810   BUN 17 02/08/2012 1530   CREATININE 0.85 06/20/2012 1518   CREATININE 0.80 04/02/2012 0810   CREATININE 0.79 02/08/2012 1530   CREATININE 0.73 11/29/2010 1142   CREATININE 0.63 09/19/2010 2008   CREATININE 0.71 08/16/2010 1922   CALCIUM 10.1 06/20/2012 1518   CALCIUM 10.1 04/02/2012 0810   CALCIUM 9.8 02/08/2012 1530  GFRNONAA >60 11/29/2010 1142   GFRNONAA >60 12/24/2009 1212   GFRNONAA >60 12/24/2009   GFRAA  Value: >60        The eGFR has been calculated using the MDRD equation. This calculation has not been validated in all clinical situations. eGFR's persistently <60 mL/min signify possible Chronic Kidney Disease. 12/24/2009 1212   CMP     Component Value Date/Time   NA 139 06/20/2012 1518   K 5.2 06/20/2012 1518   CL 101 06/20/2012 1518   CO2 29 06/20/2012 1518   GLUCOSE 114* 06/20/2012 1518   BUN 18 06/20/2012 1518   CREATININE 0.85 06/20/2012 1518   CREATININE 0.73 11/29/2010 1142   CALCIUM 10.1 06/20/2012 1518   PROT 6.5 02/08/2012 1530   ALBUMIN 4.2 02/08/2012 1530   AST 13 02/08/2012 1530   ALT 11 02/08/2012 1530   ALKPHOS 78 02/08/2012 1530   BILITOT 0.5 02/08/2012 1530   GFRNONAA >60 11/29/2010 1142   GFRAA  Value: >60        The eGFR has been calculated using the MDRD equation. This calculation has not been validated in all clinical situations. eGFR's persistently <60 mL/min signify possible Chronic Kidney Disease. 12/24/2009 1212       Component Value  Date/Time   WBC 10.0 02/08/2012 1530   WBC 8.7 12/24/2009 1212   HGB 12.7 02/08/2012 1530   HGB 13.3 12/24/2009 1212   HCT 38.2 02/08/2012 1530   HCT 38.2 12/24/2009 1212   MCV 84.0 02/08/2012 1530   MCV 91.2 12/24/2009 1212    Lipid Panel     Component Value Date/Time   CHOL 189 11/29/2010 1142   TRIG 278 11/29/2010 1142   HDL 37 11/29/2010 1142   CHOLHDL 5.0 Ratio 09/19/2010 2008   VLDL 64* 09/19/2010 2008   LDLCALC 96 11/29/2010 1142    ABG No results found for this basename: phart, pco2, pco2art, po2, po2art, hco3, tco2, acidbasedef, o2sat     Lab Results  Component Value Date   TSH 1.565 ***Test methodology is 3rd generation TSH**** 12/24/2009   BNP (last 3 results) No results found for this basename: PROBNP,  in the last 8760 hours Cardiac Panel (last 3 results) No results found for this basename: CKTOTAL, CKMB, TROPONINI, RELINDX,  in the last 72 hours  Iron/TIBC/Ferritin No results found for this basename: iron, tibc, ferritin     EKG Orders placed in visit on 02/20/13  . EKG 12-LEAD     Prior Assessment and Plan Problem List as of 02/20/2013     ICD-9-CM     Cardiology Problems   HYPERLIPIDEMIA   Last Assessment & Plan   05/22/2012 Office Visit Written 05/22/2012  8:01 PM by Kathlen Brunswick, MD     Lipid profile less than one year ago revealed reasonable total and HDL cholesterol but markedly elevated triglycerides resulting in inability to estimate LDL.  A repeat profile will be obtained with a direct measurement of LDL.  Non-HDL cholesterol is moderately elevated.    LBBB (left bundle branch block)   Ventricular tachycardia   Atrial septal defect   Last Assessment & Plan   05/22/2012 Office Visit Written 05/22/2012  8:00 PM by Kathlen Brunswick, MD     Surgical repair was performed relatively late in life when patient was 67 years of age.  It is entirely possible that there is a degree of fixed residual pulmonary hypertension, but this has not appeared to be severe  based upon the patient's symptoms  and previous imaging studies.      Hypertension   Last Assessment & Plan   05/22/2012 Office Visit Written 05/22/2012  8:07 PM by Kathlen Brunswick, MD     Systolic blood pressure has been inadequately controlled for sometime, but recent values are excellent.  Adverse effects related to lisinopril include cough and likely hyperkalemia.  Losartan 50 mg per day will be substituted.  Patient will report elevated blood pressures, if any.  Low potassium diet prescribed, and chemistry profile will be repeated in one month.        Imaging: No results found.

## 2013-02-21 ENCOUNTER — Encounter: Payer: Self-pay | Admitting: Cardiology

## 2013-02-21 NOTE — Progress Notes (Signed)
[name], female   DOB: 1946/10/10, 67 y.o.   MRN: [id number]  HPI: Schedule return visit for this nice woman with a remote history of ventricular tachycardia, repair of atrial septal defect many years ago and hypertension. Since her last visit, she is doing quite well with good performance status and no significant medical problems.  Current Outpatient Prescriptions  Medication Sig Dispense Refill  . aspirin 81 MG tablet Take 81 mg by mouth daily.        . fish oil-omega-3 fatty acids 1000 MG capsule Take 1 capsule by mouth 2 (two) times daily.        . furosemide (LASIX) 20 MG tablet Take 1 tablet (20 mg total) by mouth daily.  30 tablet  2  . hydrochlorothiazide (MICROZIDE) 12.5 MG capsule TAKE ONE CAPSULE BY MOUTH DAILY AS NEEDED FOR EDEMA  30 capsule  4  . losartan (COZAAR) 50 MG tablet Take 1 tablet (50 mg total) by mouth daily.  30 tablet  12  . metoprolol succinate (TOPROL-XL) 50 MG 24 hr tablet Take 1 tablet (50 mg total) by mouth daily. Take with or immediately following a meal.  30 tablet  2  . Multiple Vitamin (MULTIVITAMIN) tablet Take 1 tablet by mouth daily.        Marland Kitchen omeprazole (PRILOSEC) 20 MG capsule Take 20 mg by mouth daily.        . pravastatin (PRAVACHOL) 80 MG tablet Take 1 tablet (80 mg total) by mouth daily.  30 tablet  6   No current facility-administered medications for this visit.   Allergies  Allergen Reactions  . Morphine     REACTION: GI symptoms     Past medical history, social history, and family history reviewed and updated.  ROS: Denies chest pain, dyspnea, palpitations, lightheadedness or syncope. Patient monitors blood pressure control, which has been good. All other systems reviewed and are negative.  PHYSICAL EXAM: BP 110/68  Pulse 74  Ht 5\' 4"  (1.626 m)  Wt 76.712 kg (169 lb 1.9 oz)  BMI 29.01 kg/m2;  Body mass index is 29.01 kg/(m^2). General-Well developed; no acute distress Body habitus-mildly overweight Neck-No JVD; no  carotid bruits Lungs-clear lung fields; resonant to percussion Cardiovascular-normal PMI; normal S1 and S2 Abdomen-normal bowel sounds; soft and non-tender without masses or organomegaly Musculoskeletal-No deformities, no cyanosis or clubbing Neurologic-Normal cranial nerves; symmetric strength and tone Skin-Warm, no significant lesions Extremities-distal pulses intact; no edema  Braxton Bing, MD 02/21/2013  1:05 PM  ASSESSMENT AND PLAN

## 2013-02-21 NOTE — Assessment & Plan Note (Signed)
High triglyceride level precluded assessment of LDL on most recent lipid profile. Repeat study will be obtained with a direct measurement of LDL.

## 2013-02-21 NOTE — Assessment & Plan Note (Signed)
The presence of left bundle branch block along may be artifactually depressing apparent left ventricular systolic function in the absence of a myopathic process.

## 2013-02-21 NOTE — Assessment & Plan Note (Signed)
Blood pressure control is excellent with current medications, which will be continued. 

## 2013-02-21 NOTE — Assessment & Plan Note (Signed)
Patient has never been symptomatic, and arrhythmia has not been documented for many years.

## 2013-02-21 NOTE — Assessment & Plan Note (Addendum)
Patient found to have mild left ventricular dysfunction on her most recent echocardiogram one year ago.  This is likely an artifact related to the presence of left bundle branch block.

## 2013-02-25 ENCOUNTER — Encounter: Payer: Self-pay | Admitting: *Deleted

## 2013-03-05 NOTE — Patient Instructions (Addendum)
Your procedure is scheduled on:  03/11/2013  Report to [facility] at  700   AM.  Call this number if you have problems the morning of surgery: [phone]   Do not eat food or drink liquids :After Midnight.      Take these medicines the morning of surgery with A SIP OF WATER: cozaar,toprol,prilosec   Do not wear jewelry, make-up or nail polish.  Do not wear lotions, powders, or perfumes.   Do not shave 48 hours prior to surgery.  Do not bring valuables to the hospital.  Contacts, dentures or bridgework may not be worn into surgery.  Leave suitcase in the car. After surgery it may be brought to your room.  For patients admitted to the hospital, checkout time is 11:00 AM the day of discharge.   Patients discharged the day of surgery will not be allowed to drive home.  :     Please read over the following fact sheets that you were given: Coughing and Deep Breathing, Surgical Site Infection Prevention, Anesthesia Post-op Instructions and Care and Recovery After Surgery    Cataract A cataract is a clouding of the lens of the eye. When a lens becomes cloudy, vision is reduced based on the degree and nature of the clouding. Many cataracts reduce vision to some degree. Some cataracts make people more near-sighted as they develop. Other cataracts increase glare. Cataracts that are ignored and become worse can sometimes look white. The white color can be seen through the pupil. CAUSES   Aging. However, cataracts may occur at any age, even in newborns.   Certain drugs.   Trauma to the eye.   Certain diseases such as diabetes.   Specific eye diseases such as chronic inflammation inside the eye or a sudden attack of a rare form of glaucoma.   Inherited or acquired medical problems.  SYMPTOMS   Gradual, progressive drop in vision in the affected eye.   Severe, rapid visual loss. This most often happens when trauma is the cause.  DIAGNOSIS  To detect a cataract, an eye doctor examines the  lens. Cataracts are best diagnosed with an exam of the eyes with the pupils enlarged (dilated) by drops.  TREATMENT  For an early cataract, vision may improve by using different eyeglasses or stronger lighting. If that does not help your vision, surgery is the only effective treatment. A cataract needs to be surgically removed when vision loss interferes with your everyday activities, such as driving, reading, or watching TV. A cataract may also have to be removed if it prevents examination or treatment of another eye problem. Surgery removes the cloudy lens and usually replaces it with a substitute lens (intraocular lens, IOL).  At a time when both you and your doctor agree, the cataract will be surgically removed. If you have cataracts in both eyes, only one is usually removed at a time. This allows the operated eye to heal and be out of danger from any possible problems after surgery (such as infection or poor wound healing). In rare cases, a cataract may be doing damage to your eye. In these cases, your caregiver may advise surgical removal right away. The vast majority of people who have cataract surgery have better vision afterward. HOME CARE INSTRUCTIONS  If you are not planning surgery, you may be asked to do the following:  Use different eyeglasses.   Use stronger or brighter lighting.   Ask your eye doctor about reducing your medicine dose or changing  medicines if it is thought that a medicine caused your cataract. Changing medicines does not make the cataract go away on its own.   Become familiar with your surroundings. Poor vision can lead to injury. Avoid bumping into things on the affected side. You are at a higher risk for tripping or falling.   Exercise extreme care when driving or operating machinery.   Wear sunglasses if you are sensitive to bright light or experiencing problems with glare.  SEEK IMMEDIATE MEDICAL CARE IF:   You have a worsening or sudden vision loss.   You  notice redness, swelling, or increasing pain in the eye.   You have a fever.  Document Released: 10/16/2005 Document Revised: 10/05/2011 Document Reviewed: 06/09/2011 [facility] Patient Information 2012 [email or web address].PATIENT INSTRUCTIONS POST-ANESTHESIA  IMMEDIATELY FOLLOWING SURGERY:  Do not drive or operate machinery for the first twenty four hours after surgery.  Do not make any important decisions for twenty four hours after surgery or while taking narcotic pain medications or sedatives.  If you develop intractable nausea and vomiting or a severe headache please notify your doctor immediately.  FOLLOW-UP:  Please make an appointment with your surgeon as instructed. You do not need to follow up with anesthesia unless specifically instructed to do so.  WOUND CARE INSTRUCTIONS (if applicable):  Keep a dry clean dressing on the anesthesia/puncture wound site if there is drainage.  Once the wound has quit draining you may leave it open to air.  Generally you should leave the bandage intact for twenty four hours unless there is drainage.  If the epidural site drains for more than 36-48 hours please call the anesthesia department.  QUESTIONS?:  Please feel free to call your physician or the hospital operator if you have any questions, and they will be happy to assist you.

## 2013-03-06 ENCOUNTER — Encounter (HOSPITAL_COMMUNITY)
Admission: RE | Admit: 2013-03-06 | Discharge: 2013-03-06 | Disposition: A | Discharge status: Home / Self Care | Payer: Medicare Other | Source: Ambulatory Visit | Attending: Ophthalmology | Admitting: Ophthalmology

## 2013-03-06 ENCOUNTER — Encounter (HOSPITAL_COMMUNITY): Payer: Self-pay

## 2013-03-06 ENCOUNTER — Encounter (HOSPITAL_COMMUNITY): Payer: Self-pay | Admitting: Pharmacy Technician

## 2013-03-06 HISTORY — DX: Gastro-esophageal reflux disease without esophagitis: K21.9

## 2013-03-06 LAB — BASIC METABOLIC PANEL
BUN: 21 mg/dL (ref 6–23)
CO2: 29 mEq/L (ref 19–32)
Calcium: 10 mg/dL (ref 8.4–10.5)
Chloride: 97 mEq/L (ref 96–112)
Creatinine, Ser: 0.71 mg/dL (ref 0.50–1.10)
GFR calc Af Amer: >90 mL/min (ref >90)
GFR calc non Af Amer: 88 mL/min — ABNORMAL LOW (ref >90)
Glucose, Bld: 82 mg/dL (ref 70–99)
Potassium: 4.9 mEq/L (ref 3.5–5.1)
Sodium: 138 mEq/L (ref 135–145)

## 2013-03-06 LAB — HEMOGLOBIN AND HEMATOCRIT, BLOOD
HCT: 39.1 % (ref 36.0–46.0)
Hemoglobin: 13.5 g/dL (ref 12.0–15.0)

## 2013-03-11 ENCOUNTER — Encounter (HOSPITAL_COMMUNITY): Payer: Self-pay | Admitting: Anesthesiology

## 2013-03-11 ENCOUNTER — Encounter (HOSPITAL_COMMUNITY)
Admission: RE | Disposition: A | Discharge status: Home / Self Care | Payer: Self-pay | Source: Ambulatory Visit | Attending: Ophthalmology

## 2013-03-11 ENCOUNTER — Ambulatory Visit (HOSPITAL_COMMUNITY): Payer: Medicare Other | Admitting: Anesthesiology

## 2013-03-11 ENCOUNTER — Other Ambulatory Visit: Payer: Self-pay

## 2013-03-11 ENCOUNTER — Encounter (HOSPITAL_COMMUNITY): Payer: Self-pay | Admitting: *Deleted

## 2013-03-11 ENCOUNTER — Ambulatory Visit (HOSPITAL_COMMUNITY)
Admission: RE | Admit: 2013-03-11 | Discharge: 2013-03-11 | Disposition: A | Discharge status: Home / Self Care | Payer: Medicare Other | Source: Ambulatory Visit | Attending: Ophthalmology | Admitting: Ophthalmology

## 2013-03-11 DIAGNOSIS — H2510 Age-related nuclear cataract, unspecified eye: Secondary | ICD-10-CM | POA: Insufficient documentation

## 2013-03-11 DIAGNOSIS — I10 Essential (primary) hypertension: Secondary | ICD-10-CM | POA: Insufficient documentation

## 2013-03-11 HISTORY — PX: CATARACT EXTRACTION W/PHACO: SHX586

## 2013-03-11 SURGERY — PHACOEMULSIFICATION, CATARACT, WITH IOL INSERTION
Anesthesia: Monitor Anesthesia Care | Site: Eye | Laterality: Left | Wound class: Clean

## 2013-03-11 MED ORDER — FUROSEMIDE 20 MG PO TABS: 20.0000 mg | ORAL_TABLET | Freq: Every day | ORAL | Status: DC

## 2013-03-11 MED ORDER — LACTATED RINGERS IV SOLN
INTRAVENOUS | Status: DC
  Administered 2013-03-11: 1000 mL via INTRAVENOUS

## 2013-03-11 MED ORDER — TETRACAINE 0.5 % OP SOLN OPTIME - NO CHARGE
OPHTHALMIC | Status: DC | PRN
  Administered 2013-03-11: 2 [drp] via OPHTHALMIC

## 2013-03-11 MED ORDER — MIDAZOLAM HCL 2 MG/2ML IJ SOLN
INTRAMUSCULAR | Status: AC
  Filled 2013-03-11: qty 2

## 2013-03-11 MED ORDER — EPINEPHRINE HCL 1 MG/ML IJ SOLN
INTRAMUSCULAR | Status: AC
  Filled 2013-03-11: qty 1

## 2013-03-11 MED ORDER — PHENYLEPHRINE HCL 2.5 % OP SOLN
1.0000 [drp] | OPHTHALMIC | Status: AC
  Administered 2013-03-11 (×3): 1 [drp] via OPHTHALMIC

## 2013-03-11 MED ORDER — CYCLOPENTOLATE-PHENYLEPHRINE 0.2-1 % OP SOLN
1.0000 [drp] | OPHTHALMIC | Status: AC
  Administered 2013-03-11 (×3): 1 [drp] via OPHTHALMIC

## 2013-03-11 MED ORDER — ONDANSETRON HCL 4 MG/2ML IJ SOLN: 4.0000 mg | Freq: Once | INTRAMUSCULAR | Status: DC | PRN

## 2013-03-11 MED ORDER — EPINEPHRINE HCL 1 MG/ML IJ SOLN
INTRAOCULAR | Status: DC | PRN
  Administered 2013-03-11: 08:00:00

## 2013-03-11 MED ORDER — BSS IO SOLN
INTRAOCULAR | Status: DC | PRN
  Administered 2013-03-11: 15 mL via INTRAOCULAR

## 2013-03-11 MED ORDER — MIDAZOLAM HCL 2 MG/2ML IJ SOLN
1.0000 mg | INTRAMUSCULAR | Status: DC | PRN
  Administered 2013-03-11: 2 mg via INTRAVENOUS

## 2013-03-11 MED ORDER — FENTANYL CITRATE 0.05 MG/ML IJ SOLN: 25.0000 ug | INTRAMUSCULAR | Status: DC | PRN

## 2013-03-11 MED ORDER — FLURBIPROFEN SODIUM 0.03 % OP SOLN
1.0000 [drp] | OPHTHALMIC | Status: AC
  Administered 2013-03-11 (×3): 1 [drp] via OPHTHALMIC

## 2013-03-11 MED ORDER — TETRACAINE HCL 0.5 % OP SOLN
1.0000 [drp] | OPHTHALMIC | Status: AC
  Administered 2013-03-11 (×3): 1 [drp] via OPHTHALMIC

## 2013-03-11 SURGICAL SUPPLY — 24 items
CAPSULAR TENSION RING-AMO (OPHTHALMIC RELATED) IMPLANT
CLOTH BEACON ORANGE TIMEOUT ST (SAFETY) ×2 IMPLANT
EYE SHIELD UNIVERSAL CLEAR (GAUZE/BANDAGES/DRESSINGS) ×2 IMPLANT
GLOVE BIO SURGEON STRL SZ 6.5 (GLOVE) ×4 IMPLANT
GLOVE ECLIPSE 6.5 STRL STRAW (GLOVE) IMPLANT
GLOVE ECLIPSE 7.0 STRL STRAW (GLOVE) IMPLANT
GLOVE EXAM NITRILE LRG STRL (GLOVE) IMPLANT
GLOVE EXAM NITRILE MD LF STRL (GLOVE) IMPLANT
GLOVE SKINSENSE NS SZ6.5 (GLOVE)
GLOVE SKINSENSE STRL SZ6.5 (GLOVE) IMPLANT
GOWN STRL REIN XL XLG (GOWN DISPOSABLE) ×2 IMPLANT
HEALON 5 0.6 ML (INTRAOCULAR LENS) IMPLANT
KIT VITRECTOMY (OPHTHALMIC RELATED) IMPLANT
PAD ARMBOARD 7.5X6 YLW CONV (MISCELLANEOUS) ×2 IMPLANT
PROC W NO LENS (INTRAOCULAR LENS)
PROC W SPEC LENS (INTRAOCULAR LENS)
PROCESS W NO LENS (INTRAOCULAR LENS) IMPLANT
PROCESS W SPEC LENS (INTRAOCULAR LENS) IMPLANT
RING MALYGIN (MISCELLANEOUS) IMPLANT
SIGHTPATH CAT PROC W REG LENS (Ophthalmic Related) ×2 IMPLANT
TAPE SURG TRANSPORE 1 IN (GAUZE/BANDAGES/DRESSINGS) ×1 IMPLANT
TAPE SURGICAL TRANSPORE 1 IN (GAUZE/BANDAGES/DRESSINGS) ×1
VISCOELASTIC ADDITIONAL (OPHTHALMIC RELATED) IMPLANT
WATER STERILE IRR 250ML POUR (IV SOLUTION) ×2 IMPLANT

## 2013-03-11 NOTE — H&P (Signed)
The patient was re examined and there is no change in the patients condition since the original H and P. 

## 2013-03-11 NOTE — Progress Notes (Signed)
Pt was informed pre-op to take beta blocker prior to arrival to day surgery, however pt states that she did not take it this morning," will take it with my breakfast when I get home." Dr. Jayme Cloud is aware.

## 2013-03-11 NOTE — Op Note (Signed)
Patient brought to the operating room and prepped and draped in the usual manner.  Lid speculum inserted in left eye.  Stab incision made at the twelve o'clock position.  Provisc instilled in the anterior chamber.   A 2.4 mm. Stab incision was made temporally.  An anterior capsulotomy was done with a bent 25 gauge needle.  The nucleus was hydrodissected.  The Phaco tip was inserted in the anterior chamber and the nucleus was emulsified.  CDE was 12.20.  The cortical material was then removed with the I and A tip.  Posterior capsule was the polished.  The anterior chamber was deepened with Provisc.  A 22.0 Diopter Rayner 570C IOL was then inserted in the capsular bag.  Provisc was then removed with the I and A tip.  The wound was then hydrated.  Patient sent to the Recovery Room in good condition with follow up in my office.

## 2013-03-11 NOTE — Anesthesia Preprocedure Evaluation (Signed)
Anesthesia Evaluation  Patient identified by MRN, date of birth, ID band Patient awake    Reviewed: Allergy & Precautions, H&P , NPO status , Patient's Chart, lab work & pertinent test results, reviewed documented beta blocker date and time   Airway Mallampati: II TM Distance: >3 FB     Dental  (+) Edentulous Upper   Pulmonary  breath sounds clear to auscultation        Cardiovascular hypertension, Pt. on medications + dysrhythmias Ventricular Tachycardia Rhythm:Regular Rate:Normal     Neuro/Psych    GI/Hepatic GERD-  Medicated,  Endo/Other    Renal/GU      Musculoskeletal   Abdominal   Peds  Hematology   Anesthesia Other Findings   Reproductive/Obstetrics                           Anesthesia Physical Anesthesia Plan  ASA: III  Anesthesia Plan: MAC   Post-op Pain Management:    Induction: Intravenous  Airway Management Planned: Nasal Cannula  Additional Equipment:   Intra-op Plan:   Post-operative Plan:   Informed Consent: I have reviewed the patients History and Physical, chart, labs and discussed the procedure including the risks, benefits and alternatives for the proposed anesthesia with the patient or authorized representative who has indicated his/her understanding and acceptance.     Plan Discussed with:   Anesthesia Plan Comments:         Anesthesia Quick Evaluation

## 2013-03-11 NOTE — Transfer of Care (Signed)
Immediate Anesthesia Transfer of Care Note  Patient: Heather Barber  Procedure(s) Performed: Procedure(s) with comments: CATARACT EXTRACTION PHACO AND INTRAOCULAR LENS PLACEMENT (IOC) (Left) - CDE:  12.20  Patient Location: PACU and Short Stay  Anesthesia Type:MAC  Level of Consciousness: awake  Airway & Oxygen Therapy: Patient Spontanous Breathing  Post-op Assessment: Report given to PACU RN  Post vital signs: Reviewed  Complications: No apparent anesthesia complications

## 2013-03-11 NOTE — Anesthesia Postprocedure Evaluation (Signed)
  Anesthesia Post-op Note  Patient: Heather Barber  Procedure(s) Performed: Procedure(s) with comments: CATARACT EXTRACTION PHACO AND INTRAOCULAR LENS PLACEMENT (IOC) (Left) - CDE:  12.20  Patient Location: PACU and Short Stay  Anesthesia Type:MAC  Level of Consciousness: awake, alert  and oriented  Airway and Oxygen Therapy: Patient Spontanous Breathing  Post-op Pain: none  Post-op Assessment: Post-op Vital signs reviewed, Patient's Cardiovascular Status Stable, Respiratory Function Stable, Patent Airway and No signs of Nausea or vomiting  Post-op Vital Signs: Reviewed and stable  Complications: No apparent anesthesia complications

## 2013-03-11 NOTE — Telephone Encounter (Signed)
..   Requested Prescriptions   Signed Prescriptions Disp Refills  . furosemide (LASIX) 20 MG tablet 30 tablet 2    Sig: Take 1 tablet (20 mg total) by mouth daily.    Authorizing Provider: Kathlen Brunswick    Ordering User: Christella Hartigan, Nishanth Mccaughan Judie Petit

## 2013-03-13 ENCOUNTER — Encounter (HOSPITAL_COMMUNITY): Payer: Self-pay | Admitting: Ophthalmology

## 2013-03-14 ENCOUNTER — Other Ambulatory Visit: Payer: Self-pay | Admitting: Cardiology

## 2013-03-14 LAB — COMPREHENSIVE METABOLIC PANEL
ALT: 19 U/L (ref 0–35)
AST: 16 U/L (ref 0–37)
Albumin: 4.4 g/dL (ref 3.5–5.2)
Alkaline Phosphatase: 57 U/L (ref 39–117)
BUN: 19 mg/dL (ref 6–23)
CO2: 28 mEq/L (ref 19–32)
Calcium: 9.7 mg/dL (ref 8.4–10.5)
Chloride: 105 mEq/L (ref 96–112)
Creat: 0.68 mg/dL (ref 0.50–1.10)
Glucose, Bld: 96 mg/dL (ref 70–99)
Potassium: 5.2 mEq/L (ref 3.5–5.3)
Sodium: 142 mEq/L (ref 135–145)
Total Bilirubin: 0.4 mg/dL (ref 0.3–1.2)
Total Protein: 6.6 g/dL (ref 6.0–8.3)

## 2013-03-14 LAB — LIPID PANEL
Cholesterol: 182 mg/dL (ref 0–200)
HDL: 43 mg/dL (ref >39)
LDL Cholesterol: 67 mg/dL (ref 0–99)
Total CHOL/HDL Ratio: 4.2 Ratio
Triglycerides: 359 mg/dL — ABNORMAL HIGH (ref <150)
VLDL: 72 mg/dL — ABNORMAL HIGH (ref 0–40)

## 2013-03-17 ENCOUNTER — Encounter: Payer: Self-pay | Admitting: Cardiology

## 2013-03-18 ENCOUNTER — Other Ambulatory Visit: Payer: Self-pay | Admitting: *Deleted

## 2013-03-18 MED ORDER — METOPROLOL SUCCINATE ER 50 MG PO TB24: 50.0000 mg | ORAL_TABLET | Freq: Every day | ORAL | Status: DC

## 2013-06-12 ENCOUNTER — Other Ambulatory Visit: Payer: Self-pay

## 2013-06-12 MED ORDER — FUROSEMIDE 20 MG PO TABS: 20.0000 mg | ORAL_TABLET | Freq: Every day | ORAL | Status: DC

## 2013-06-12 MED ORDER — LOSARTAN POTASSIUM 50 MG PO TABS: 50.0000 mg | ORAL_TABLET | Freq: Every day | ORAL | Status: DC

## 2013-06-12 MED ORDER — PRAVASTATIN SODIUM 80 MG PO TABS: 80.0000 mg | ORAL_TABLET | Freq: Every day | ORAL | Status: DC

## 2013-08-04 ENCOUNTER — Other Ambulatory Visit (HOSPITAL_COMMUNITY): Payer: Self-pay | Admitting: General Surgery

## 2013-08-04 DIAGNOSIS — Z139 Encounter for screening, unspecified: Secondary | ICD-10-CM

## 2013-08-18 ENCOUNTER — Ambulatory Visit (HOSPITAL_COMMUNITY): Payer: Medicare Other

## 2013-08-25 ENCOUNTER — Ambulatory Visit (HOSPITAL_COMMUNITY)
Admission: RE | Admit: 2013-08-25 | Discharge: 2013-08-25 | Disposition: A | Discharge status: Home / Self Care | Payer: Medicare Other | Source: Ambulatory Visit | Attending: General Surgery | Admitting: General Surgery

## 2013-08-25 DIAGNOSIS — Z139 Encounter for screening, unspecified: Secondary | ICD-10-CM

## 2013-08-25 DIAGNOSIS — Z1231 Encounter for screening mammogram for malignant neoplasm of breast: Secondary | ICD-10-CM | POA: Insufficient documentation

## 2013-11-10 ENCOUNTER — Other Ambulatory Visit: Payer: Self-pay

## 2013-11-10 ENCOUNTER — Telehealth: Payer: Self-pay | Admitting: Adult Health

## 2013-11-10 MED ORDER — LOSARTAN POTASSIUM 50 MG PO TABS: 50.0000 mg | ORAL_TABLET | Freq: Every day | ORAL | Status: DC

## 2013-11-10 MED ORDER — FUROSEMIDE 20 MG PO TABS: 20.0000 mg | ORAL_TABLET | Freq: Every day | ORAL | Status: DC

## 2013-11-10 NOTE — Telephone Encounter (Signed)
furosemide (LASIX) 20 MG tablet losartan (COZAAR) 50 MG tablet   Patient needs called in to [facility] in [place].  No Refills remaining per pharmacy.

## 2013-11-10 NOTE — Telephone Encounter (Signed)
[facility],rx filled

## 2013-11-10 NOTE — Telephone Encounter (Signed)
rx refilled.

## 2013-11-10 NOTE — Telephone Encounter (Signed)
[facility] no longer open,attempt to reach pt,home # disconnected,lm on mobile,need to know new pharmacy asked pt to cb with home number and pharm she uses now   C.Wilber Oliphant RN

## 2013-11-19 ENCOUNTER — Telehealth: Payer: Self-pay | Admitting: *Deleted

## 2013-11-19 MED ORDER — LOSARTAN POTASSIUM 50 MG PO TABS: 50.0000 mg | ORAL_TABLET | Freq: Every day | ORAL | Status: DC

## 2013-11-19 NOTE — Telephone Encounter (Signed)
Pharmacy is calling for new Rx on losartan

## 2013-11-19 NOTE — Telephone Encounter (Signed)
Medication sent via escribe.  

## 2013-12-16 ENCOUNTER — Other Ambulatory Visit: Payer: Self-pay | Admitting: Cardiology

## 2013-12-17 ENCOUNTER — Telehealth: Payer: Self-pay

## 2013-12-17 MED ORDER — METOPROLOL SUCCINATE ER 50 MG PO TB24: 50.0000 mg | ORAL_TABLET | Freq: Every day | ORAL | Status: DC

## 2013-12-17 MED ORDER — PRAVASTATIN SODIUM 80 MG PO TABS: ORAL_TABLET | ORAL | Status: DC

## 2013-12-17 MED ORDER — FUROSEMIDE 20 MG PO TABS: 20.0000 mg | ORAL_TABLET | Freq: Every day | ORAL | Status: DC

## 2013-12-17 NOTE — Telephone Encounter (Signed)
Medication sent via escribe for 90 day supply

## 2013-12-17 NOTE — Telephone Encounter (Signed)
Received request to authorize 90 day supply for Furosemide 20 mg tab from [facility]

## 2013-12-17 NOTE — Telephone Encounter (Signed)
Received request to authorize 90 day supply for:  Pravastatin Sodium 80 mg tab Metoprolol Succ ER 50 Mg Tab

## 2013-12-19 ENCOUNTER — Other Ambulatory Visit: Payer: Self-pay | Admitting: *Deleted

## 2013-12-19 MED ORDER — LOSARTAN POTASSIUM 50 MG PO TABS: 50.0000 mg | ORAL_TABLET | Freq: Every day | ORAL | Status: DC

## 2014-02-23 ENCOUNTER — Ambulatory Visit (INDEPENDENT_AMBULATORY_CARE_PROVIDER_SITE_OTHER): Payer: Medicare Other | Admitting: Cardiology

## 2014-02-23 VITALS — BP 134/58 | HR 83 | Ht 66.0 in | Wt 144.0 lb

## 2014-02-23 DIAGNOSIS — I519 Heart disease, unspecified: Secondary | ICD-10-CM

## 2014-02-23 DIAGNOSIS — I340 Nonrheumatic mitral (valve) insufficiency: Secondary | ICD-10-CM

## 2014-02-23 DIAGNOSIS — E785 Hyperlipidemia, unspecified: Secondary | ICD-10-CM

## 2014-02-23 DIAGNOSIS — I059 Rheumatic mitral valve disease, unspecified: Secondary | ICD-10-CM

## 2014-02-23 DIAGNOSIS — I10 Essential (primary) hypertension: Secondary | ICD-10-CM

## 2014-02-23 DIAGNOSIS — I447 Left bundle-branch block, unspecified: Secondary | ICD-10-CM

## 2014-02-23 DIAGNOSIS — I472 Ventricular tachycardia, unspecified: Secondary | ICD-10-CM

## 2014-02-23 DIAGNOSIS — I4729 Other ventricular tachycardia: Secondary | ICD-10-CM

## 2014-02-23 NOTE — Progress Notes (Signed)
Clinical Summary Ms. Laramee is a 68 y.o.female former patient of Dr Lattie Haw, this is our first visit together. She is seen for the following medical problems.  1. Ventricular tachycardia - remote history per notes, has had no recurrence - denies any recent palpitations. No lightheadedness, no dizziness, no syncope or presyncope  2. ASD repair - reports was done in her 84s, repaired at [facility] - denies any dyspnea  3. HTN - checks at home once a week. Typically around 120s/60s - compliant with meds  4. Hyperlipidemia - compliant with pravastatin - reports recent panel by pcp Dr Edrick Oh  5. Mild LV systolic dysfunction - per Dr Earline Mayotte notes thought to be artifact related to LBBB - denies DOE. Occas LE edema, better with lasix. NO orthopnea.   6. Mild to moderate MR - noted on echo 01/2012, denies any symptoms  Past Medical History  Diagnosis Date  . Chest pain     Normal coronary angiography in 1994 and 2011  . LBBB (left bundle Crispin Vogel block) 2011  . Ventricular tachycardia     Exercise induced  . Hyperlipidemia   . Hypertension   . Dizziness   . Syncope   . Palpitations   . Atrial septal defect     Surgical repair in 1994  . GERD (gastroesophageal reflux disease)      Allergies  Allergen Reactions  . Codeine Nausea And Vomiting  . Morphine     REACTION: GI symptoms     Current Outpatient Prescriptions  Medication Sig Dispense Refill  . aspirin 81 MG tablet Take 81 mg by mouth daily.        . fish oil-omega-3 fatty acids 1000 MG capsule Take 1 capsule by mouth 2 (two) times daily.        . furosemide (LASIX) 20 MG tablet Take 1 tablet (20 mg total) by mouth daily.  90 tablet  0  . losartan (COZAAR) 50 MG tablet Take 1 tablet (50 mg total) by mouth daily.  90 tablet  0  . metoprolol succinate (TOPROL-XL) 50 MG 24 hr tablet Take 1 tablet (50 mg total) by mouth daily. Take with or immediately following a meal.  90 tablet  0  . Multiple Vitamin  (MULTIVITAMIN) tablet Take 1 tablet by mouth daily.        Marland Kitchen omeprazole (PRILOSEC) 20 MG capsule Take 20 mg by mouth daily.        . pravastatin (PRAVACHOL) 80 MG tablet TAKE 1 TABLET DAILY  90 tablet  0   No current facility-administered medications for this visit.     Past Surgical History  Procedure Laterality Date  . Asd repair  [facility]  . Breast excisional biopsy      Right and left  . Tonsillectomy    . Tubal ligation      Bilateral  . Cataract extraction w/phaco Left 03/11/2013    Procedure: CATARACT EXTRACTION PHACO AND INTRAOCULAR LENS PLACEMENT (IOC);  Surgeon: [name], MD;  Location: [facility];  Service: Ophthalmology;  Laterality: Left;  CDE:  12.20     Allergies  Allergen Reactions  . Codeine Nausea And Vomiting  . Morphine     REACTION: GI symptoms      Family History  Problem Relation Age of Onset  . Coronary artery disease Neg Hx   . Stroke Sister      Social History Ms. Klaus reports that she has never smoked. She does  not have any smokeless tobacco history on file. Ms. Adamczak reports that she does not drink alcohol.   Review of Systems CONSTITUTIONAL: No weight loss, fever, chills, weakness or fatigue.  HEENT: Eyes: No visual loss, blurred vision, double vision or yellow sclerae.No hearing loss, sneezing, congestion, runny nose or sore throat.  SKIN: No rash or itching.  CARDIOVASCULAR: per HPI RESPIRATORY: No shortness of breath, cough or sputum.  GASTROINTESTINAL: No anorexia, nausea, vomiting or diarrhea. No abdominal pain or blood.  GENITOURINARY: No burning on urination, no polyuria NEUROLOGICAL: No headache, dizziness, syncope, paralysis, ataxia, numbness or tingling in the extremities. No change in bowel or bladder control.  MUSCULOSKELETAL: No muscle, back pain, joint pain or stiffness.  LYMPHATICS: No enlarged nodes. No history of splenectomy.  PSYCHIATRIC: No history of depression or anxiety.  ENDOCRINOLOGIC:  No reports of sweating, cold or heat intolerance. No polyuria or polydipsia.  Marland Kitchen   Physical Examination Filed Vitals:   02/23/14 1607  BP: 134/58  Pulse: 83   Filed Weights   02/23/14 1607  Weight: 144 lb (65.318 kg)    Gen: resting comfortably, no acute distress HEENT: no scleral icterus, pupils equal round and reactive, no palptable cervical adenopathy,  CV: RRR, 2/6 systolic murmur at apex Resp: Clear to auscultation bilaterally GI: abdomen is soft, non-tender, non-distended, normal bowel sounds, no hepatosplenomegaly MSK: extremities are warm, no edema.  Skin: warm, no rash Neuro:  no focal deficits Psych: appropriate affect   Diagnostic Studies 01/2012 Echo Left ventricle: The cavity size was normal. Wall thickness was normal. Systolic function was mildly to moderately reduced. The estimated ejection fraction was in the range of 40% to 45%. - Ventricular septum: Septal motion showed paradox. - Aortic valve: Mildly calcified annulus. Trileaflet; mildly calcified leaflets. - Mitral valve: Mild to moderate regurgitation. - Left atrium: The atrium was mildly dilated. - Right ventricle: The cavity size was mildly dilated. Wall thickness was normal. - Right atrium: The atrium was mildly dilated. - Atrial septum: No defect or patent foramen ovale was identified. - Pulmonary arteries: PA peak pressure: 59mm Hg (S). - Pericardium, extracardiac: Minimal ascites was noted.      Assessment and Plan   1. Ventricular tachycardia - remote history, no recurrence or recent symptoms - continue beta blocker  2. HTN - at goal, continue current meds  3. Hyperlipidemia - request most recent panel from pcp, continue current statin  4. Mild LV systolic dysfunction - described in prior notes, there was question if could be artifact related to LBBB per prior notes - repeat echo  5. Mild to moderate MR - no current symptoms - repeat echo to follow for progression and/or LV  changes  F/u 1 year   [name], M.D., F.A.C.C.

## 2014-02-23 NOTE — Patient Instructions (Signed)
Your physician wants you to follow-up in: 1 year You will receive a reminder letter in the mail two months in advance. If you don't receive a letter, please call our office to schedule the follow-up appointment.     Your physician recommends that you continue on your current medications as directed. Please refer to the Current Medication list given to you today.     Your physician has requested that you have an echocardiogram. Echocardiography is a painless test that uses sound waves to create images of your heart. It provides your doctor with information about the size and shape of your heart and how well your heart's chambers and valves are working. This procedure takes approximately one hour. There are no restrictions for this procedure.      Thank you for choosing [facility] !        

## 2014-02-26 ENCOUNTER — Ambulatory Visit (HOSPITAL_COMMUNITY)
Admission: RE | Admit: 2014-02-26 | Discharge: 2014-02-26 | Disposition: A | Discharge status: Home / Self Care | Payer: Medicare Other | Source: Ambulatory Visit | Attending: Cardiology | Admitting: Cardiology

## 2014-02-26 DIAGNOSIS — I447 Left bundle-branch block, unspecified: Secondary | ICD-10-CM | POA: Insufficient documentation

## 2014-02-26 DIAGNOSIS — I059 Rheumatic mitral valve disease, unspecified: Secondary | ICD-10-CM

## 2014-02-26 DIAGNOSIS — Q211 Atrial septal defect: Secondary | ICD-10-CM | POA: Insufficient documentation

## 2014-02-26 DIAGNOSIS — I340 Nonrheumatic mitral (valve) insufficiency: Secondary | ICD-10-CM

## 2014-02-26 DIAGNOSIS — Q2111 Secundum atrial septal defect: Secondary | ICD-10-CM | POA: Insufficient documentation

## 2014-02-26 DIAGNOSIS — E785 Hyperlipidemia, unspecified: Secondary | ICD-10-CM | POA: Insufficient documentation

## 2014-02-26 DIAGNOSIS — I10 Essential (primary) hypertension: Secondary | ICD-10-CM | POA: Insufficient documentation

## 2014-02-26 NOTE — Progress Notes (Signed)
*  PRELIMINARY RESULTS* Echocardiogram 2D Echocardiogram has been performed.  Elvia Collum 02/26/2014, 3:22 PM

## 2014-06-19 ENCOUNTER — Other Ambulatory Visit: Payer: Self-pay | Admitting: Adult Health

## 2014-06-20 ENCOUNTER — Other Ambulatory Visit: Payer: Self-pay | Admitting: Adult Health

## 2014-08-10 ENCOUNTER — Other Ambulatory Visit: Payer: Self-pay | Admitting: Adult Health

## 2014-08-26 ENCOUNTER — Other Ambulatory Visit (HOSPITAL_COMMUNITY): Payer: Self-pay | Admitting: General Surgery

## 2014-08-26 DIAGNOSIS — Z1231 Encounter for screening mammogram for malignant neoplasm of breast: Secondary | ICD-10-CM

## 2014-09-04 ENCOUNTER — Ambulatory Visit (HOSPITAL_COMMUNITY)
Admission: RE | Admit: 2014-09-04 | Discharge: 2014-09-04 | Disposition: A | Discharge status: Home / Self Care | Payer: Medicare Other | Source: Ambulatory Visit | Attending: General Surgery | Admitting: General Surgery

## 2014-09-04 DIAGNOSIS — Z1231 Encounter for screening mammogram for malignant neoplasm of breast: Secondary | ICD-10-CM | POA: Insufficient documentation

## 2014-09-07 ENCOUNTER — Ambulatory Visit (HOSPITAL_COMMUNITY): Payer: Medicare Other

## 2014-09-14 ENCOUNTER — Other Ambulatory Visit: Payer: Self-pay | Admitting: Cardiology

## 2014-09-14 MED ORDER — LOSARTAN POTASSIUM 50 MG PO TABS: 50.0000 mg | ORAL_TABLET | Freq: Every day | ORAL | Status: DC

## 2014-09-14 NOTE — Telephone Encounter (Signed)
Received fax refill request  Rx # [id number] Medication:  Losartan Potassium 50 mg tab Qty 30 Sig:  Take one tablet by mouth daily Physician:  Harl Bowie

## 2014-09-14 NOTE — Telephone Encounter (Signed)
Refill complete 

## 2015-06-09 ENCOUNTER — Other Ambulatory Visit: Payer: Self-pay | Admitting: *Deleted

## 2015-06-09 NOTE — Telephone Encounter (Signed)
Pt requesting pravastatin 80 mg and metoprolol refill. Verbally gave 30 day supply no refills until appt made.

## 2015-07-12 ENCOUNTER — Other Ambulatory Visit: Payer: Self-pay | Admitting: Cardiology

## 2015-07-29 ENCOUNTER — Other Ambulatory Visit: Payer: Self-pay

## 2015-07-29 MED ORDER — FUROSEMIDE 20 MG PO TABS: ORAL_TABLET | ORAL | Status: DC

## 2015-08-07 ENCOUNTER — Other Ambulatory Visit: Payer: Self-pay | Admitting: Cardiology

## 2015-08-10 ENCOUNTER — Encounter: Payer: Self-pay | Admitting: Cardiology

## 2015-08-10 ENCOUNTER — Ambulatory Visit (INDEPENDENT_AMBULATORY_CARE_PROVIDER_SITE_OTHER): Payer: Medicare Other | Admitting: Cardiology

## 2015-08-10 VITALS — BP 142/64 | HR 83 | Ht 66.0 in | Wt 175.0 lb

## 2015-08-10 DIAGNOSIS — I10 Essential (primary) hypertension: Secondary | ICD-10-CM

## 2015-08-10 DIAGNOSIS — I472 Ventricular tachycardia, unspecified: Secondary | ICD-10-CM

## 2015-08-10 DIAGNOSIS — I447 Left bundle-branch block, unspecified: Secondary | ICD-10-CM

## 2015-08-10 NOTE — Progress Notes (Signed)
[name], female   DOB: September 30, 1946, 69 y.o.   MRN: [id number]     Clinical Summary Heather Barber is a 69 y.o.female seen today for follow up of the following medical problems.   1. Ventricular tachycardia - remote history per notes, has had no recurrence - denies any recent palpitations. No lightheadedness, no dizziness, no syncope or presyncope. Has done well since starting Toprol.    2. ASD repair - reports was done in her 69s, repaired at [facility] - echo 01/2014 shows no active ASD or shunt - denies any dyspnea  3. HTN - checks at home once a week. Typically around 140s/60s - compliant with meds  4. Hyperlipidemia - compliant with pravastatin - reports recent panel by pcp Dr Edrick Oh   Past Medical History  Diagnosis Date  . Chest pain     Normal coronary angiography in 1994 and 2011  . LBBB (left bundle Jovanny Stephanie block) 2011  . Ventricular tachycardia     Exercise induced  . Hyperlipidemia   . Hypertension   . Dizziness   . Syncope   . Palpitations   . Atrial septal defect     Surgical repair in 1994  . GERD (gastroesophageal reflux disease)      Allergies  Allergen Reactions  . Codeine Nausea And Vomiting  . Morphine     REACTION: GI symptoms     Current Outpatient Prescriptions  Medication Sig Dispense Refill  . aspirin 81 MG tablet Take 81 mg by mouth daily.      . fish oil-omega-3 fatty acids 1000 MG capsule Take 1 capsule by mouth 2 (two) times daily.      . furosemide (LASIX) 20 MG tablet TAKE 1 TABLET (20 MG TOTAL) BY MOUTH DAILY. 90 tablet 3  . losartan (COZAAR) 50 MG tablet Take 1 tablet (50 mg total) by mouth daily. 90 tablet 3  . metoprolol succinate (TOPROL-XL) 50 MG 24 hr tablet TAKE 1 TABLET (50 MG TOTAL) BY MOUTH DAILY. TAKE WITH OR IMMEDIATELY FOLLOWING A MEAL. 30 tablet 0  . Multiple Vitamin (MULTIVITAMIN) tablet Take 1 tablet by mouth daily.      Marland Kitchen omeprazole (PRILOSEC) 20 MG capsule Take 20 mg by mouth daily.      . pravastatin  (PRAVACHOL) 80 MG tablet TAKE 1 TABLET DAILY 30 tablet 6   No current facility-administered medications for this visit.     Past Surgical History  Procedure Laterality Date  . Asd repair  [facility]  . Breast excisional biopsy      Right and left  . Tonsillectomy    . Tubal ligation      Bilateral  . Cataract extraction w/phaco Left 03/11/2013    Procedure: CATARACT EXTRACTION PHACO AND INTRAOCULAR LENS PLACEMENT (IOC);  Surgeon: [name], MD;  Location: [facility];  Service: Ophthalmology;  Laterality: Left;  CDE:  12.20     Allergies  Allergen Reactions  . Codeine Nausea And Vomiting  . Morphine     REACTION: GI symptoms      Family History  Problem Relation Age of Onset  . Coronary artery disease Neg Hx   . Stroke Sister      Social History Heather Barber reports that she has never smoked. She does not have any smokeless tobacco history on file. Heather Barber reports that she does not drink alcohol.   Review of Systems CONSTITUTIONAL: No weight loss, fever, chills, weakness or fatigue.  HEENT:  Eyes: No visual loss, blurred vision, double vision or yellow sclerae.No hearing loss, sneezing, congestion, runny nose or sore throat.  SKIN: No rash or itching.  CARDIOVASCULAR: per HPI RESPIRATORY: No shortness of breath, cough or sputum.  GASTROINTESTINAL: No anorexia, nausea, vomiting or diarrhea. No abdominal pain or blood.  GENITOURINARY: No burning on urination, no polyuria NEUROLOGICAL: No headache, dizziness, syncope, paralysis, ataxia, numbness or tingling in the extremities. No change in bowel or bladder control.  MUSCULOSKELETAL: No muscle, back pain, joint pain or stiffness.  LYMPHATICS: No enlarged nodes. No history of splenectomy.  PSYCHIATRIC: No history of depression or anxiety.  ENDOCRINOLOGIC: No reports of sweating, cold or heat intolerance. No polyuria or polydipsia.  Marland Kitchen   Physical Examination Filed Vitals:   08/10/15 1606  BP:  142/64  Pulse: 83   Filed Vitals:   08/10/15 1606  Height: 5\' 6"  (1.676 m)  Weight: 175 lb (79.379 kg)    Gen: resting comfortably, no acute distress HEENT: no scleral icterus, pupils equal round and reactive, no palptable cervical adenopathy,  CV: RRR, 2/6 systolic murmur RUSB, no jvd Resp: Clear to auscultation bilaterally GI: abdomen is soft, non-tender, non-distended, normal bowel sounds, no hepatosplenomegaly MSK: extremities are warm, no edema.  Skin: warm, no rash Neuro:  no focal deficits Psych: appropriate affect   Diagnostic Studies 01/2012 Echo Left ventricle: The cavity size was normal. Wall thickness was normal. Systolic function was mildly to moderately reduced. The estimated ejection fraction was in the range of 40% to 45%. - Ventricular septum: Septal motion showed paradox. - Aortic valve: Mildly calcified annulus. Trileaflet; mildly calcified leaflets. - Mitral valve: Mild to moderate regurgitation. - Left atrium: The atrium was mildly dilated. - Right ventricle: The cavity size was mildly dilated. Wall thickness was normal. - Right atrium: The atrium was mildly dilated. - Atrial septum: No defect or patent foramen ovale was identified. - Pulmonary arteries: PA peak pressure: 34mm Hg (S). - Pericardium, extracardiac: Minimal ascites was noted.    Assessment and Plan   1. Ventricular tachycardia - remote history, no recurrence or recent symptoms - continue beta blocker  2. HTN - at goal, continue current meds  3. Hyperlipidemia - request most recent panel from pcp, continue current statin - continue statin    F/u 1 year  [name], M.D.

## 2015-08-10 NOTE — Patient Instructions (Signed)
Your physician wants you to follow-up in: 1 year with DrBranch You will receive a reminder letter in the mail two months in advance. If you don't receive a letter, please call our office to schedule the follow-up appointment.     Your physician recommends that you continue on your current medications as directed. Please refer to the Current Medication list given to you today.      Thank you for choosing [facility] !        

## 2015-08-11 ENCOUNTER — Ambulatory Visit (HOSPITAL_COMMUNITY): Payer: Medicare Other

## 2015-08-11 ENCOUNTER — Other Ambulatory Visit (HOSPITAL_COMMUNITY): Payer: Self-pay | Admitting: Family Medicine

## 2015-08-11 DIAGNOSIS — Z1231 Encounter for screening mammogram for malignant neoplasm of breast: Secondary | ICD-10-CM

## 2015-08-17 ENCOUNTER — Other Ambulatory Visit: Payer: Self-pay | Admitting: Cardiology

## 2015-08-23 ENCOUNTER — Other Ambulatory Visit: Payer: Self-pay | Admitting: Obstetrics and Gynecology

## 2015-08-23 ENCOUNTER — Other Ambulatory Visit (HOSPITAL_COMMUNITY)
Admission: RE | Admit: 2015-08-23 | Discharge: 2015-08-23 | Disposition: A | Discharge status: Home / Self Care | Payer: Medicare Other | Source: Ambulatory Visit | Attending: Obstetrics and Gynecology | Admitting: Obstetrics and Gynecology

## 2015-08-23 ENCOUNTER — Encounter: Payer: Self-pay | Admitting: Obstetrics and Gynecology

## 2015-08-23 ENCOUNTER — Ambulatory Visit (INDEPENDENT_AMBULATORY_CARE_PROVIDER_SITE_OTHER): Payer: Medicare Other | Admitting: Obstetrics and Gynecology

## 2015-08-23 VITALS — BP 142/80 | HR 76 | Ht 64.0 in | Wt 173.2 lb

## 2015-08-23 DIAGNOSIS — Z124 Encounter for screening for malignant neoplasm of cervix: Secondary | ICD-10-CM

## 2015-08-23 DIAGNOSIS — Z01419 Encounter for gynecological examination (general) (routine) without abnormal findings: Secondary | ICD-10-CM | POA: Insufficient documentation

## 2015-08-23 DIAGNOSIS — Z1151 Encounter for screening for human papillomavirus (HPV): Secondary | ICD-10-CM | POA: Diagnosis present

## 2015-08-23 DIAGNOSIS — R229 Localized swelling, mass and lump, unspecified: Principal | ICD-10-CM

## 2015-08-23 DIAGNOSIS — IMO0002 Reserved for concepts with insufficient information to code with codable children: Secondary | ICD-10-CM

## 2015-08-23 MED ORDER — ESTRADIOL 10 MCG VA TABS: 1.0000 | ORAL_TABLET | VAGINAL | Status: DC

## 2015-08-23 NOTE — Progress Notes (Signed)
[name], female   DOB: Oct 12, 1946, 69 y.o.   MRN: [id number]  Assessment:  Annual Gyn Exam  hx elevated platelets.being worked up by primary care. Right breast mass, smooth mobile  Plan:  1. pap smear done, next pap due q 84yr. 2. return annually or prn 3    Diagnostic mammogram advised and will arrange tues nov 1 3pm 4. Rs Premarin vc hs 2x/wk. Subjective:  Heather Barber is a 69 y.o. female No obstetric history on file. who presents for annual exam. No LMP recorded. Patient is postmenopausal. The patient has complaints today of vaginal itching like before, when given Premarin VC for Q3d use.  The following portions of the patient's history were reviewed and updated as appropriate: allergies, current medications, past family history, past medical history, past social history, past surgical history and problem list. Past Medical History  Diagnosis Date  . Chest pain     Normal coronary angiography in 1994 and 2011  . LBBB (left bundle branch block) 2011  . Ventricular tachycardia ([name])     Exercise induced  . Hyperlipidemia   . Hypertension   . Dizziness   . Syncope   . Palpitations   . Atrial septal defect     Surgical repair in 1994  . GERD (gastroesophageal reflux disease)     Past Surgical History  Procedure Laterality Date  . Asd repair  [facility]  . Breast excisional biopsy      Right and left  . Tonsillectomy    . Tubal ligation      Bilateral  . Cataract extraction w/phaco Left 03/11/2013    Procedure: CATARACT EXTRACTION PHACO AND INTRAOCULAR LENS PLACEMENT (IOC);  Surgeon: [name], MD;  Location: [facility];  Service: Ophthalmology;  Laterality: Left;  CDE:  12.20     Current outpatient prescriptions:  .  aspirin 81 MG tablet, Take 81 mg by mouth daily.  , Disp: , Rfl:  .  fenofibrate 160 MG tablet, Take 160 mg by mouth daily. , Disp: , Rfl:  .  fish oil-omega-3 fatty acids 1000 MG capsule, Take 1 capsule by mouth 2  (two) times daily.  , Disp: , Rfl:  .  furosemide (LASIX) 20 MG tablet, TAKE 1 TABLET (20 MG TOTAL) BY MOUTH DAILY., Disp: 90 tablet, Rfl: 3 .  losartan (COZAAR) 50 MG tablet, Take 1 tablet (50 mg total) by mouth daily., Disp: 90 tablet, Rfl: 3 .  metoprolol succinate (TOPROL-XL) 50 MG 24 hr tablet, TAKE 1 TABLET (50 MG TOTAL) BY MOUTH DAILY. TAKE WITH OR IMMEDIATELY FOLLOWING A MEAL., Disp: 30 tablet, Rfl: 11 .  Multiple Vitamin (MULTIVITAMIN) tablet, Take 1 tablet by mouth daily.  , Disp: , Rfl:  .  omeprazole (PRILOSEC) 20 MG capsule, Take 20 mg by mouth daily.  , Disp: , Rfl:  .  pravastatin (PRAVACHOL) 80 MG tablet, TAKE 1 TABLET DAILY, Disp: 30 tablet, Rfl: 6  Review of Systems Constitutional: negative, elevated platelets. Gastrointestinal: negative Genitourinary: vag itching , not sexually active , no hx yeast.  Objective:  BP 142/80 mmHg  Pulse 76  Ht 5\' 4"  (1.626 m)  Wt 78.563 kg (173 lb 3.2 oz)  BMI 29.72 kg/m2   BMI: Body mass index is 29.72 kg/(m^2).  General Appearance: Alert, appropriate appearance for age. No acute distress HEENT: Grossly normal Neck / Thyroid:  Cardiovascular: RRR; normal S1, S2, no murmur Lungs: CTA bilaterally Back: No CVAT Breast  Exam: Normal to inspection, discrete mass, in right Upper breast, a 4 x4cm wide by 1 cm thick mobile firm area in upper aspects of breast on right, well above prior biopsy site  right and No masses or nodes.No dimpling, nipple retraction or discharge. Gastrointestinal: Soft, non-tender, no masses or organomegaly Pelvic Exam: Vulva and vagina appear normal. Bimanual exam reveals normal uterus and adnexa. pap done Rectovaginal: normal rectal, no masses and guaiac negative stool obtained Lymphatic Exam: Non-palpable nodes in neck, clavicular, axillary, or inguinal regions Skin: no rash or abnormalities Neurologic: Normal gait and speech, no tremor  Psychiatric: Alert and oriented, appropriate affect.  Urinalysis:Not  done  Mallory Shirk. MD Pgr [phone] 3:52 PM

## 2015-08-26 LAB — CYTOLOGY - PAP

## 2015-08-31 ENCOUNTER — Ambulatory Visit (HOSPITAL_COMMUNITY)
Admission: RE | Admit: 2015-08-31 | Discharge: 2015-08-31 | Disposition: A | Discharge status: Home / Self Care | Payer: Medicare Other | Source: Ambulatory Visit | Attending: Family Medicine | Admitting: Family Medicine

## 2015-08-31 ENCOUNTER — Ambulatory Visit (HOSPITAL_COMMUNITY)
Admission: RE | Admit: 2015-08-31 | Discharge: 2015-08-31 | Disposition: A | Discharge status: Home / Self Care | Payer: Medicare Other | Source: Ambulatory Visit | Attending: Obstetrics and Gynecology | Admitting: Obstetrics and Gynecology

## 2015-08-31 ENCOUNTER — Other Ambulatory Visit (HOSPITAL_COMMUNITY): Payer: Self-pay | Admitting: Family Medicine

## 2015-08-31 DIAGNOSIS — N63 Unspecified lump in breast: Secondary | ICD-10-CM | POA: Diagnosis not present

## 2015-08-31 DIAGNOSIS — N6310 Unspecified lump in the right breast, unspecified quadrant: Secondary | ICD-10-CM

## 2015-08-31 DIAGNOSIS — IMO0002 Reserved for concepts with insufficient information to code with codable children: Secondary | ICD-10-CM

## 2015-08-31 DIAGNOSIS — R229 Localized swelling, mass and lump, unspecified: Secondary | ICD-10-CM

## 2015-09-06 ENCOUNTER — Other Ambulatory Visit: Payer: Self-pay | Admitting: *Deleted

## 2015-09-06 MED ORDER — LOSARTAN POTASSIUM 50 MG PO TABS: 50.0000 mg | ORAL_TABLET | Freq: Every day | ORAL | Status: DC

## 2015-09-13 ENCOUNTER — Ambulatory Visit (HOSPITAL_COMMUNITY): Payer: Medicare Other

## 2015-09-27 ENCOUNTER — Ambulatory Visit (INDEPENDENT_AMBULATORY_CARE_PROVIDER_SITE_OTHER): Payer: Medicare Other | Admitting: Obstetrics and Gynecology

## 2015-09-27 ENCOUNTER — Encounter: Payer: Self-pay | Admitting: Obstetrics and Gynecology

## 2015-09-27 VITALS — BP 120/80 | Ht 66.0 in | Wt 173.5 lb

## 2015-09-27 DIAGNOSIS — N6459 Other signs and symptoms in breast: Secondary | ICD-10-CM | POA: Insufficient documentation

## 2015-09-27 NOTE — Progress Notes (Signed)
[facility] Visit  Patient name: Heather Barber MRN [id number]  Date of birth: 05-Jul-1946  CC & HPI:  Heather Barber is a 70 y.o. female presenting today for f/u of pap smear and mammogram. She has no complaints at this time.  Patient states she was last sexually active 5-10 years ago.   ROS:  A complete review of systems was obtained and all systems are negative except as noted in the HPI and PMH.    Pertinent History Reviewed:   Reviewed: Significant for right and left breast biopsy Medical         Past Medical History  Diagnosis Date  . Chest pain     Normal coronary angiography in 1994 and 2011  . LBBB (left bundle branch block) 2011  . Ventricular tachycardia ([name])     Exercise induced  . Hyperlipidemia   . Hypertension   . Dizziness   . Syncope   . Palpitations   . Atrial septal defect     Surgical repair in 1994  . GERD (gastroesophageal reflux disease)                               Surgical Hx:    Past Surgical History  Procedure Laterality Date  . Asd repair  [facility]  . Breast excisional biopsy      Right and left  . Tonsillectomy    . Tubal ligation      Bilateral  . Cataract extraction w/phaco Left 03/11/2013    Procedure: CATARACT EXTRACTION PHACO AND INTRAOCULAR LENS PLACEMENT (IOC);  Surgeon: [name], MD;  Location: [facility];  Service: Ophthalmology;  Laterality: Left;  CDE:  12.20   Medications: Reviewed & Updated - see associated section                       Current outpatient prescriptions:  .  aspirin 81 MG tablet, Take 81 mg by mouth daily.  , Disp: , Rfl:  .  Estradiol (VAGIFEM) 10 MCG TABS vaginal tablet, Place 1 tablet (10 mcg total) vaginally 2 (two) times a week., Disp: 8 tablet, Rfl: 12 .  fenofibrate 160 MG tablet, Take 160 mg by mouth daily. , Disp: , Rfl:  .  fish oil-omega-3 fatty acids 1000 MG capsule, Take 1 capsule by mouth 2 (two) times daily.  , Disp: , Rfl:  .  furosemide (LASIX) 20 MG tablet,  TAKE 1 TABLET (20 MG TOTAL) BY MOUTH DAILY., Disp: 90 tablet, Rfl: 3 .  losartan (COZAAR) 50 MG tablet, Take 1 tablet (50 mg total) by mouth daily., Disp: 90 tablet, Rfl: 3 .  metoprolol succinate (TOPROL-XL) 50 MG 24 hr tablet, TAKE 1 TABLET (50 MG TOTAL) BY MOUTH DAILY. TAKE WITH OR IMMEDIATELY FOLLOWING A MEAL., Disp: 30 tablet, Rfl: 11 .  Multiple Vitamin (MULTIVITAMIN) tablet, Take 1 tablet by mouth daily.  , Disp: , Rfl:  .  omeprazole (PRILOSEC) 20 MG capsule, Take 20 mg by mouth daily.  , Disp: , Rfl:  .  pravastatin (PRAVACHOL) 80 MG tablet, TAKE 1 TABLET DAILY, Disp: 30 tablet, Rfl: 6   Social History: Reviewed -  reports that she has never smoked. She does not have any smokeless tobacco history on file.  Objective Findings:  Vitals: Blood pressure 120/80, height 5\' 6"  (1.676 m), weight 173 lb 8 oz (78.699 kg).  Physical Examination:  Pt here for discussion only.    Assessment & Plan:   A:  1. Mammogram and pap results discussed with patient. Mammogram had no concerning findings. Pap was negative except for high-risk HPV detected.  P:  1. Repeat pap in 1 year. Will also do breast exam at that time. 2 mammogram in 1 yr screening.    By signing my name below, I, Stephania Fragmin, attest that this documentation has been prepared under the direction and in the presence of [name], MD. Electronically Signed: Stephania Fragmin, ED Scribe. 09/27/2015. 4:21 PM.  I personally performed the services described in this documentation, which was SCRIBED in my presence. The recorded information has been reviewed and considered accurate. It has been edited as necessary during review. [name], MD  I spent 25 minutes with the visit with >than 50% spent in counseling and coordination of care. (scribe attestation statement)

## 2016-06-06 ENCOUNTER — Encounter (HOSPITAL_COMMUNITY): Payer: Self-pay | Admitting: Oncology

## 2016-06-06 ENCOUNTER — Other Ambulatory Visit (HOSPITAL_COMMUNITY): Payer: Self-pay | Admitting: Oncology

## 2016-06-06 DIAGNOSIS — D473 Essential (hemorrhagic) thrombocythemia: Secondary | ICD-10-CM

## 2016-06-06 DIAGNOSIS — D75839 Thrombocytosis, unspecified: Secondary | ICD-10-CM | POA: Insufficient documentation

## 2016-06-06 HISTORY — DX: Essential (hemorrhagic) thrombocythemia: D47.3

## 2016-06-06 MED ORDER — HYDROXYUREA 500 MG PO CAPS: 500.0000 mg | ORAL_CAPSULE | Freq: Two times a day (BID) | ORAL | 0 refills | Status: DC

## 2016-06-08 ENCOUNTER — Encounter (HOSPITAL_COMMUNITY): Payer: Medicare Other | Attending: Oncology

## 2016-06-08 DIAGNOSIS — D473 Essential (hemorrhagic) thrombocythemia: Secondary | ICD-10-CM | POA: Insufficient documentation

## 2016-06-08 LAB — CBC WITH DIFFERENTIAL/PLATELET
Basophils Absolute: 0 10*3/uL (ref 0.0–0.1)
Basophils Relative: 0 %
Eosinophils Absolute: 0.1 10*3/uL (ref 0.0–0.7)
Eosinophils Relative: 2 %
HCT: 31.6 % — ABNORMAL LOW (ref 36.0–46.0)
Hemoglobin: 11.2 g/dL — ABNORMAL LOW (ref 12.0–15.0)
Lymphocytes Relative: 43 %
Lymphs Abs: 2 10*3/uL (ref 0.7–4.0)
MCH: 41.9 pg — ABNORMAL HIGH (ref 26.0–34.0)
MCHC: 35.4 g/dL (ref 30.0–36.0)
MCV: 118.4 fL — ABNORMAL HIGH (ref 78.0–100.0)
Monocytes Absolute: 0.3 10*3/uL (ref 0.1–1.0)
Monocytes Relative: 6 %
Neutro Abs: 2.3 10*3/uL (ref 1.7–7.7)
Neutrophils Relative %: 49 %
Platelets: 251 10*3/uL (ref 150–400)
RBC: 2.67 MIL/uL — ABNORMAL LOW (ref 3.87–5.11)
RDW: 12.5 % (ref 11.5–15.5)
WBC: 4.8 10*3/uL (ref 4.0–10.5)

## 2016-06-08 LAB — COMPREHENSIVE METABOLIC PANEL
ALT: 26 U/L (ref 14–54)
AST: 24 U/L (ref 15–41)
Albumin: 4.3 g/dL (ref 3.5–5.0)
Alkaline Phosphatase: 53 U/L (ref 38–126)
Anion gap: 8 (ref 5–15)
BUN: 20 mg/dL (ref 6–20)
CO2: 27 mmol/L (ref 22–32)
Calcium: 9.1 mg/dL (ref 8.9–10.3)
Chloride: 101 mmol/L (ref 101–111)
Creatinine, Ser: 0.78 mg/dL (ref 0.44–1.00)
GFR calc Af Amer: >60 mL/min (ref >60)
GFR calc non Af Amer: >60 mL/min (ref >60)
Glucose, Bld: 150 mg/dL — ABNORMAL HIGH (ref 65–99)
Potassium: 4.5 mmol/L (ref 3.5–5.1)
Sodium: 136 mmol/L (ref 135–145)
Total Bilirubin: 0.5 mg/dL (ref 0.3–1.2)
Total Protein: 7 g/dL (ref 6.5–8.1)

## 2016-06-09 ENCOUNTER — Other Ambulatory Visit (HOSPITAL_COMMUNITY): Payer: Medicare Other

## 2016-07-12 ENCOUNTER — Encounter (HOSPITAL_COMMUNITY): Payer: Medicare Other | Attending: Oncology | Admitting: Oncology

## 2016-07-12 ENCOUNTER — Encounter (HOSPITAL_COMMUNITY): Payer: Medicare Other

## 2016-07-12 ENCOUNTER — Encounter (HOSPITAL_COMMUNITY): Payer: Self-pay | Admitting: Oncology

## 2016-07-12 VITALS — BP 141/70 | HR 66 | Temp 97.8°F | Resp 18 | Ht 66.0 in | Wt 170.6 lb

## 2016-07-12 DIAGNOSIS — D473 Essential (hemorrhagic) thrombocythemia: Secondary | ICD-10-CM

## 2016-07-12 DIAGNOSIS — Z1589 Genetic susceptibility to other disease: Secondary | ICD-10-CM

## 2016-07-12 DIAGNOSIS — D7589 Other specified diseases of blood and blood-forming organs: Secondary | ICD-10-CM

## 2016-07-12 LAB — CBC WITH DIFFERENTIAL/PLATELET
Basophils Absolute: 0 10*3/uL (ref 0.0–0.1)
Basophils Relative: 0 %
Eosinophils Absolute: 0 10*3/uL (ref 0.0–0.7)
Eosinophils Relative: 1 %
HCT: 34.3 % — ABNORMAL LOW (ref 36.0–46.0)
Hemoglobin: 12.1 g/dL (ref 12.0–15.0)
Lymphocytes Relative: 49 %
Lymphs Abs: 1.8 10*3/uL (ref 0.7–4.0)
MCH: 42.2 pg — ABNORMAL HIGH (ref 26.0–34.0)
MCHC: 35.3 g/dL (ref 30.0–36.0)
MCV: 119.5 fL — ABNORMAL HIGH (ref 78.0–100.0)
Monocytes Absolute: 0.4 10*3/uL (ref 0.1–1.0)
Monocytes Relative: 10 %
Neutro Abs: 1.4 10*3/uL — ABNORMAL LOW (ref 1.7–7.7)
Neutrophils Relative %: 40 %
Platelets: 248 10*3/uL (ref 150–400)
RBC: 2.87 MIL/uL — ABNORMAL LOW (ref 3.87–5.11)
RDW: 12.5 % (ref 11.5–15.5)
WBC: 3.6 10*3/uL — ABNORMAL LOW (ref 4.0–10.5)

## 2016-07-12 LAB — COMPREHENSIVE METABOLIC PANEL
ALT: 25 U/L (ref 14–54)
AST: 23 U/L (ref 15–41)
Albumin: 4.6 g/dL (ref 3.5–5.0)
Alkaline Phosphatase: 66 U/L (ref 38–126)
Anion gap: 10 (ref 5–15)
BUN: 18 mg/dL (ref 6–20)
CO2: 26 mmol/L (ref 22–32)
Calcium: 9.6 mg/dL (ref 8.9–10.3)
Chloride: 103 mmol/L (ref 101–111)
Creatinine, Ser: 0.91 mg/dL (ref 0.44–1.00)
GFR calc Af Amer: >60 mL/min (ref >60)
GFR calc non Af Amer: >60 mL/min (ref >60)
Glucose, Bld: 112 mg/dL — ABNORMAL HIGH (ref 65–99)
Potassium: 4.2 mmol/L (ref 3.5–5.1)
Sodium: 139 mmol/L (ref 135–145)
Total Bilirubin: 0.6 mg/dL (ref 0.3–1.2)
Total Protein: 7.3 g/dL (ref 6.5–8.1)

## 2016-07-12 MED ORDER — HYDROXYUREA 500 MG PO CAPS: 500.0000 mg | ORAL_CAPSULE | Freq: Two times a day (BID) | ORAL | 3 refills | Status: DC

## 2016-07-12 NOTE — Patient Instructions (Signed)
[place] at [facility] Discharge Instructions  RECOMMENDATIONS MADE BY THE CONSULTANT AND ANY TEST RESULTS WILL BE SENT TO YOUR REFERRING PHYSICIAN.  You were seen by Gershon Mussel today Labs every 4 weeks at your Primary Care office Follow Up in 4 months here with labs Continue Hydrea 500 mg 2 times a day Please call the center with any related concerns  Thank you for choosing [place] at [facility] to provide your oncology and hematology care.  To afford each patient quality time with our provider, please arrive at least 15 minutes before your scheduled appointment time.   Beginning January 23rd 2017 lab work for the [facility] will be done in the  Main lab at [facility] on 1st floor. If you have a lab appointment with the [place] please come in thru the  Main Entrance and check in at the main information desk  You need to re-schedule your appointment should you arrive 10 or more minutes late.  We strive to give you quality time with our providers, and arriving late affects you and other patients whose appointments are after yours.  Also, if you no show three or more times for appointments you may be dismissed from the clinic at the providers discretion.     Again, thank you for choosing [facility].  Our hope is that these requests will decrease the amount of time that you wait before being seen by our physicians.       _____________________________________________________________  Should you have questions after your visit to [facility], please contact our office at [phone] between the hours of 8:30 a.m. and 4:30 p.m.  Voicemails left after 4:30 p.m. will not be returned until the following business day.  For prescription refill requests, have your pharmacy contact our office.         Resources For Cancer Patients and their Caregivers ? American Cancer Society: Can assist with transportation, wigs,  general needs, runs Look Good Feel Better.        4314170265 ? Cancer Care: Provides financial assistance, online support groups, medication/co-pay assistance.  1-800-813-HOPE (432)532-9238) ? [place] Assists [place] cancer patients and their families through emotional , educational and financial support.  (563)560-1131 ? [place] DSS Where to apply for food stamps, Medicaid and utility assistance. 825-327-5599 ? [facility]: Transportation to medical appointments. (919)842-8598 ? Social Security Administration: May apply for disability if have a Stage IV cancer. 912 703 0747 2142617668 ? [employer], Disability and Transit Services: Assists with nutrition, care and transit needs. [place] Support Programs: @10RELATIVEDAYS @ > Cancer Support Group  2nd Tuesday of the month 1pm-2pm, Journey Room  > Creative Journey  3rd Tuesday of the month 1130am-1pm, Journey Room  > Look Good Feel Better  1st Wednesday of the month 10am-12 noon, Journey Room (Call [place] to register [phone])

## 2016-07-12 NOTE — Progress Notes (Signed)
[facility] Hematology/Oncology Consultation   Name: Heather Barber      MRN: [id number]    Date: 07/12/2016 Time:10:54 PM   REFERRING PHYSICIAN:  Dione Housekeeper, MD (Primary Care Provider)  REASON FOR CONSULT:  Transfer of hematology care.   DIAGNOSIS:  JAK2 POSITIVE Essential Thrombocytosis    Essential thrombocythemia ([place])   10/05/2015 Initial Diagnosis    Essential thrombocythemia ([place])      10/06/2015 -  Chemotherapy    Hydrea         HISTORY OF PRESENT ILLNESS:   Heather Barber is a 70 y.o. female with a medical history significant for atrial septal defect repaired 30+ years ago at [facility], LBBB, hyperlipidemia, HTN who is referred to the [facility] for [place] Essential Thrombocytosis.  Her disease is managed with Hydrea. She was started in December 2016 on Hydrea 500 mg twice daily. Her dose has remained constant and her laboratory work has been very stable. She notes that her platelet count at time of diagnosis was greater than 1 million. She tolerates Hydrea without any issues.  She denies any complaints. She notes compliance with this medication. She denies any B symptoms, abdominal pain, blood in stool, black tarry stool, urinary complaints. She has no nausea with hydrea.   Review of Systems  Constitutional: Negative.  Negative for chills, fever and weight loss.  HENT: Negative.   Eyes: Negative.   Respiratory: Negative.  Negative for cough.   Cardiovascular: Negative.  Negative for chest pain.  Gastrointestinal: Negative.   Genitourinary: Negative.   Musculoskeletal: Negative.   Skin: Negative.  Negative for rash.  Neurological: Negative.  Negative for weakness.  Endo/Heme/Allergies: Negative.   Psychiatric/Behavioral: Negative.   14 point review of systems was performed and is negative except as detailed under history of present illness and above  PAST MEDICAL HISTORY:   Past Medical History:  Diagnosis Date  .  Atrial septal defect    Surgical repair in 1994  . Chest pain    Normal coronary angiography in 1994 and 2011  . Dizziness   . Essential thrombocythemia ([place]) 06/06/2016  . GERD (gastroesophageal reflux disease)   . Hyperlipidemia   . Hypertension   . LBBB (left bundle branch block) 2011  . Palpitations   . Syncope   . Ventricular tachycardia ([name])    Exercise induced    ALLERGIES: Allergies  Allergen Reactions  . Codeine Nausea And Vomiting  . Morphine     REACTION: GI symptoms      MEDICATIONS: I have reviewed the patient's current medications.    Current Outpatient Prescriptions on File Prior to Visit  Medication Sig Dispense Refill  . aspirin 81 MG tablet Take 81 mg by mouth daily.      . Estradiol (VAGIFEM) 10 MCG TABS vaginal tablet Place 1 tablet (10 mcg total) vaginally 2 (two) times a week. 8 tablet 12  . fish oil-omega-3 fatty acids 1000 MG capsule Take 1 capsule by mouth 2 (two) times daily.      . furosemide (LASIX) 20 MG tablet TAKE 1 TABLET (20 MG TOTAL) BY MOUTH DAILY. 90 tablet 3  . losartan (COZAAR) 50 MG tablet Take 1 tablet (50 mg total) by mouth daily. 90 tablet 3  . metoprolol succinate (TOPROL-XL) 50 MG 24 hr tablet TAKE 1 TABLET (50 MG TOTAL) BY MOUTH DAILY. TAKE WITH OR IMMEDIATELY FOLLOWING A MEAL. 30 tablet 11  . Multiple Vitamin (MULTIVITAMIN)  tablet Take 1 tablet by mouth daily.      Marland Kitchen omeprazole (PRILOSEC) 20 MG capsule Take 20 mg by mouth daily.      . pravastatin (PRAVACHOL) 80 MG tablet TAKE 1 TABLET DAILY 30 tablet 6  . fenofibrate 160 MG tablet Take 160 mg by mouth daily.      No current facility-administered medications on file prior to visit.      PAST SURGICAL HISTORY Past Surgical History:  Procedure Laterality Date  . [facility] EXCISIONAL BIOPSY     Right and left  . CATARACT EXTRACTION W/PHACO Left 03/11/2013   Procedure: CATARACT EXTRACTION PHACO AND INTRAOCULAR LENS PLACEMENT (IOC);   Surgeon: [name], MD;  Location: [facility];  Service: Ophthalmology;  Laterality: Left;  CDE:  12.20  . TONSILLECTOMY    . TUBAL LIGATION     Bilateral    FAMILY HISTORY: Family History  Problem Relation Age of Onset  . Cancer Mother   . Stroke Sister   . Cancer Sister   . Heart failure Father   . Coronary artery disease Neg Hx     SOCIAL HISTORY:  reports that she has never smoked. She has never used smokeless tobacco. She reports that she does not drink alcohol or use drugs. She reports being [place]. 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  Social History   Social History  . Marital status: Married    Spouse name: N/A  . Number of children: N/A  . Years of education: N/A   Occupational History  . Local printing [employer]    Social History Main Topics  . Smoking status: Never Smoker  . Smokeless tobacco: Never Used  . Alcohol use No  . Drug use: No  . Sexual activity: Yes    Birth control/ protection: Post-menopausal   Other Topics Concern  . None   Social History Narrative   Married and resides in [place] with husband and 3 children   Sedentary lifestyle    PERFORMANCE STATUS: The patient's performance status is 0 - Asymptomatic  PHYSICAL EXAM: Most Recent Vital Signs: Blood pressure (!) 141/70, pulse 66, temperature 97.8 F (36.6 C), temperature source Oral, resp. rate 18, height '5\' 6"'  (1.676 m), weight 170 lb 9.6 oz (77.4 kg), SpO2 100 %. BP (!) 141/70 (BP Location: Left Arm)   Pulse 66   Temp 97.8 F (36.6 C) (Oral)   Resp 18   Ht '5\' 6"'  (1.676 m)   Wt 170 lb 9.6 oz (77.4 kg)   SpO2 100%   BMI 27.54 kg/m   General Appearance:    Alert, cooperative, no distress, appears stated age, Unaccompanied   Head:    Normocephalic, without obvious abnormality, atraumatic  Eyes:    Conjunctiva/corneas clear, EOM's intact  Ears:    External ear anatomy normal bilaterally   Nose:   Nares normal, septum midline, mucosa  normal, no drainage    or sinus tenderness  Throat:   Lips, mucosa, and tongue normal; Upper dentures. Fair dentition lower part of mouth   Neck:   Supple, symmetrical, trachea midline, no adenopathy;    thyroid:  no enlargement/tenderness/nodules  Back:     Symmetric, no curvature, ROM normal, no CVA tenderness  Lungs:     Clear to auscultation bilaterally, respirations unlabored  Chest Wall:    No tenderness or deformity   Heart:    Regular  rate and rhythm, S1 and S2 normal, 2/6 systolic ejection murmur heard best at apex   Breast Exam:    No tenderness, masses, or nipple abnormality  Abdomen:     Soft, non-tender, bowel sounds active all four quadrants,    no masses, no organomegaly        Extremities:   Extremities normal, atraumatic, no cyanosis or edema  Pulses:   2+ and symmetric all extremities  Skin:   Skin color, texture, turgor normal, no rashes or lesions  Lymph nodes:   Cervical, supraclavicular, and axillary nodes normal  Neurologic:   CNII-XII intact, normal strength, sensation and reflexes    throughout     LABORATORY DATA:  Results for orders placed or performed in visit on 07/12/16 (from the past 48 hour(s))  CBC with Differential     Status: Abnormal   Collection Time: 07/12/16 10:03 AM  Result Value Ref Range   WBC 3.6 (L) 4.0 - 10.5 K/uL   RBC 2.87 (L) 3.87 - 5.11 MIL/uL   Hemoglobin 12.1 12.0 - 15.0 g/dL   HCT 34.3 (L) 36.0 - 46.0 %   MCV 119.5 (H) 78.0 - 100.0 fL   MCH 42.2 (H) 26.0 - 34.0 pg   MCHC 35.3 30.0 - 36.0 g/dL   RDW 12.5 11.5 - 15.5 %   Platelets 248 150 - 400 K/uL   Neutrophils Relative % 40 %   Lymphocytes Relative 49 %   Monocytes Relative 10 %   Eosinophils Relative 1 %   Basophils Relative 0 %   Neutro Abs 1.4 (L) 1.7 - 7.7 K/uL   Lymphs Abs 1.8 0.7 - 4.0 K/uL   Monocytes Absolute 0.4 0.1 - 1.0 K/uL   Eosinophils Absolute 0.0 0.0 - 0.7 K/uL   Basophils Absolute 0.0 0.0 - 0.1 K/uL  Comprehensive metabolic panel     Status: Abnormal     Collection Time: 07/12/16 10:03 AM  Result Value Ref Range   Sodium 139 135 - 145 mmol/L   Potassium 4.2 3.5 - 5.1 mmol/L   Chloride 103 101 - 111 mmol/L   CO2 26 22 - 32 mmol/L   Glucose, Bld 112 (H) 65 - 99 mg/dL   BUN 18 6 - 20 mg/dL   Creatinine, Ser 0.91 0.44 - 1.00 mg/dL   Calcium 9.6 8.9 - 10.3 mg/dL   Total Protein 7.3 6.5 - 8.1 g/dL   Albumin 4.6 3.5 - 5.0 g/dL   AST 23 15 - 41 U/L   ALT 25 14 - 54 U/L   Alkaline Phosphatase 66 38 - 126 U/L   Total Bilirubin 0.6 0.3 - 1.2 mg/dL   GFR calc non Af Amer >60 >60 mL/min   GFR calc Af Amer >60 >60 mL/min    Comment: (NOTE) The eGFR has been calculated using the CKD EPI equation. This calculation has not been validated in all clinical situations. eGFR's persistently <60 mL/min signify possible Chronic Kidney Disease.    Anion gap 10 5 - 15        RADIOGRAPHY: No results found.     ASSESSMENT/PLAN:  Essential thrombocythemia  Macrocytosis secondary to hydrea JAK 2 positive  JAK 2 POSITIVE essential thrombocytosis, on Hydrea 500 mg daily twice daily. She is tolerating this quite well. Goal platelet count is less than 450K. Last platelet count from [facility] on 05/31/2016 was 288K, on 03/21/2016 was 286K.  Other counts are WNL. Macrocytosis is secondary to hydrea.   Oncology history developed.  Labs today:  CBC diff, CMET.   Labs every 4 weeks: CBC diff, CMET.  She notes that she would prefer to have labs performed at her primary care provider's office and then faxed to [place] due to convenience.  This is absolutely reasonable.  Order is written for labs monthly at Dr. Murrell Redden office.  Labs in 4 months at the clinic: CBC diff, CMET.  I have refilled her Hydrea.  Return in 4 months for follow-up. At follow-up if counts are stable she can certainly discontinue monthly labs.   ORDERS PLACED FOR THIS ENCOUNTER: Orders Placed This Encounter  Procedures  . CBC with Differential  . Comprehensive metabolic panel     MEDICATIONS PRESCRIBED THIS ENCOUNTER: Meds ordered this encounter  Medications  . DISCONTD: hydroxyurea (HYDREA) 500 MG capsule    Sig: Take 1 capsule (500 mg total) by mouth 2 (two) times daily. May take with food to minimize GI side effects.    Dispense:  60 capsule    Refill:  3    Order Specific Question:   Supervising Provider    Answer:   Patrici Ranks [id number]  . hydroxyurea (HYDREA) 500 MG capsule    Sig: Take 1 capsule (500 mg total) by mouth 2 (two) times daily. May take with food to minimize GI side effects.    Dispense:  60 capsule    Refill:  3    Order Specific Question:   Supervising Provider    Answer:   Patrici Ranks [id number]    All questions were answered. The patient knows to call the clinic with any problems, questions or concerns. We can certainly see the patient much sooner if necessary.  This note is electronically signed BL:TGAIDKS,MMOCARE Cyril Mourning, MD  07/12/2016 10:54 PM

## 2016-07-12 NOTE — Assessment & Plan Note (Addendum)
Jak2 POSITIVE essential thrombocytosis, on Hydrea 500 mg daily.  Oncology history developed.  Labs today: CBC diff, CMET.  I personally reviewed and went over laboratory results with the patient.  The results are noted within this dictation.  Labs every 4 weeks: CBC diff, CMET.  She notes that she would prefer to have labs performed at her primary care provider's office and then faxed to [place] due to convenience.  This is absolutely reasonable.  Order is written for labs monthly at Dr. Murrell Redden office.  Labs in 4 months at the clinic: CBC diff, CMET.  I have refilled her Hydrea.  Return in 4 months for follow-up.

## 2016-07-16 ENCOUNTER — Encounter (HOSPITAL_COMMUNITY): Payer: Self-pay | Admitting: Oncology

## 2016-07-20 ENCOUNTER — Other Ambulatory Visit: Payer: Self-pay | Admitting: Cardiology

## 2016-07-21 ENCOUNTER — Other Ambulatory Visit: Payer: Self-pay | Admitting: Cardiology

## 2016-07-24 DIAGNOSIS — K219 Gastro-esophageal reflux disease without esophagitis: Secondary | ICD-10-CM | POA: Insufficient documentation

## 2016-07-25 ENCOUNTER — Other Ambulatory Visit: Payer: Self-pay

## 2016-08-26 ENCOUNTER — Other Ambulatory Visit: Payer: Self-pay | Admitting: Cardiology

## 2016-08-28 ENCOUNTER — Ambulatory Visit (INDEPENDENT_AMBULATORY_CARE_PROVIDER_SITE_OTHER): Payer: Medicare Other | Admitting: Obstetrics and Gynecology

## 2016-08-28 ENCOUNTER — Encounter: Payer: Self-pay | Admitting: Obstetrics and Gynecology

## 2016-08-28 VITALS — BP 132/72 | HR 82 | Wt 172.2 lb

## 2016-08-28 DIAGNOSIS — Z01419 Encounter for gynecological examination (general) (routine) without abnormal findings: Secondary | ICD-10-CM

## 2016-08-28 NOTE — Progress Notes (Addendum)
[name], female   DOB: January 10, 1946, 70 y.o.   MRN: [id number]   Assessment:  Annual Gyn Exam Breast exam only, pelvic next year  Pap next year when insurance will cover the test   Plan:  1. pap smear not done, next pap due next year 2. return annually or prn 3    Annual mammogram advised  Subjective:   Chief Complaint  Patient presents with  . Gynecologic Exam    pap/breast exam     Heather Barber is a 70 y.o. female No obstetric history on file. who presents for annual exam. No LMP recorded. Patient is postmenopausal. The patient has no complaints today.   Pt was due for repeat pap today d/t positive positive high risk HPV on pap from 08/23/15; however, her insurance would not cover the pap and she did not want to pay out of pocket for the pap. I am comfortable waiting a year to repeat pap so that her insurance will cover the procedure.   The following portions of the patient's history were reviewed and updated as appropriate: allergies, current medications, past family history, past medical history, past social history, past surgical history and problem list. Past Medical History:  Diagnosis Date  . Atrial septal defect    Surgical repair in 1994  . Chest pain    Normal coronary angiography in 1994 and 2011  . Dizziness   . Essential thrombocythemia ([place]) 06/06/2016  . GERD (gastroesophageal reflux disease)   . Hyperlipidemia   . Hypertension   . LBBB (left bundle branch block) 2011  . Palpitations   . Syncope   . Ventricular tachycardia ([place])    Exercise induced    Past Surgical History:  Procedure Laterality Date  . ASD [place] EXCISIONAL BIOPSY     Right and left  . CATARACT EXTRACTION W/PHACO Left 03/11/2013   Procedure: CATARACT EXTRACTION PHACO AND INTRAOCULAR LENS PLACEMENT (IOC);  Surgeon: [name], MD;  Location: [facility];  Service: Ophthalmology;  Laterality: Left;  CDE:  12.20  . TONSILLECTOMY     . TUBAL LIGATION     Bilateral     Current Outpatient Prescriptions:  .  aspirin 81 MG tablet, Take 81 mg by mouth daily.  , Disp: , Rfl:  .  Estradiol (VAGIFEM) 10 MCG TABS vaginal tablet, Place 1 tablet (10 mcg total) vaginally 2 (two) times a week., Disp: 8 tablet, Rfl: 12 .  Fenofibric Acid 105 MG TABS, , Disp: , Rfl:  .  fish oil-omega-3 fatty acids 1000 MG capsule, Take 1 capsule by mouth 2 (two) times daily.  , Disp: , Rfl:  .  furosemide (LASIX) 20 MG tablet, TAKE 1 TABLET (20 MG TOTAL) BY MOUTH DAILY., Disp: 90 tablet, Rfl: 3 .  hydroxyurea (HYDREA) 500 MG capsule, Take 1 capsule (500 mg total) by mouth 2 (two) times daily. May take with food to minimize GI side effects., Disp: 60 capsule, Rfl: 3 .  losartan (COZAAR) 50 MG tablet, TAKE 1 TABLET (50 MG TOTAL) BY MOUTH DAILY., Disp: 90 tablet, Rfl: 3 .  metoprolol succinate (TOPROL-XL) 50 MG 24 hr tablet, TAKE 1 TABLET (50 MG TOTAL) BY MOUTH DAILY. TAKE WITH OR IMMEDIATELY FOLLOWING A MEAL., Disp: 30 tablet, Rfl: 11 .  Multiple Vitamin (MULTIVITAMIN) tablet, Take 1 tablet by mouth daily.  , Disp: , Rfl:  .  omeprazole (PRILOSEC) 20 MG capsule, Take 20 mg by  mouth daily.  , Disp: , Rfl:  .  pravastatin (PRAVACHOL) 80 MG tablet, TAKE 1 TABLET DAILY, Disp: 30 tablet, Rfl: 6 .  fenofibrate 160 MG tablet, Take 160 mg by mouth daily. , Disp: , Rfl:   Review of Systems Constitutional: negative Gastrointestinal: negative Genitourinary: negative   Objective:  BP 132/72 (BP Location: Right Arm, Patient Position: Sitting, Cuff Size: Normal)   Pulse 82   Wt 172 lb 3.2 oz (78.1 kg)   BMI 27.79 kg/m    BMI: Body mass index is 27.79 kg/m.  General Appearance: Alert, appropriate appearance for age. No acute distress HEENT: Grossly normal Neck / Thyroid:  Cardiovascular: RRR; normal S1, S2, no murmur Lungs: CTA bilaterally Back: No CVAT Breast Exam: No masses or nodes.No dimpling, nipple retraction or discharge. Right breast has a scar  from biopsies.  Gastrointestinal: Soft, non-tender, no masses or organomegaly Lymphatic Exam: Non-palpable nodes in neck, clavicular, axillary, or inguinal regions  Skin: no rash or abnormalities Neurologic: Normal gait and speech, no tremor  Psychiatric: Alert and oriented, appropriate affect.  Urinalysis:Not done  Mallory Shirk. MD Pgr [phone] 4:42 PM Hx abnormal pap in health facility    By signing my name below, I, Hansel Feinstein, attest that this documentation has been prepared under the direction and in the presence of [name], MD. Electronically Signed: Hansel Feinstein, ED Scribe. 08/28/16. 4:42 PM. I personally performed the services described in this documentation, which was SCRIBED in my presence. The recorded information has been reviewed and considered accurate. It has been edited as necessary during review. [name], MD   I personally performed the services described in this documentation, which was SCRIBED in my presence. The recorded information has been reviewed and considered accurate. It has been edited as necessary during review. [name], MD

## 2016-10-02 ENCOUNTER — Ambulatory Visit (HOSPITAL_COMMUNITY)
Admission: RE | Admit: 2016-10-02 | Discharge: 2016-10-02 | Disposition: A | Discharge status: Home / Self Care | Payer: Medicare Other | Source: Ambulatory Visit | Attending: Family Medicine | Admitting: Family Medicine

## 2016-10-02 DIAGNOSIS — Z1231 Encounter for screening mammogram for malignant neoplasm of breast: Secondary | ICD-10-CM

## 2016-10-18 ENCOUNTER — Ambulatory Visit (INDEPENDENT_AMBULATORY_CARE_PROVIDER_SITE_OTHER): Payer: Medicare Other | Admitting: Cardiology

## 2016-10-18 ENCOUNTER — Encounter: Payer: Self-pay | Admitting: Cardiology

## 2016-10-18 VITALS — BP 116/78 | HR 99 | Ht 63.0 in | Wt 171.0 lb

## 2016-10-18 DIAGNOSIS — I472 Ventricular tachycardia, unspecified: Secondary | ICD-10-CM

## 2016-10-18 DIAGNOSIS — E782 Mixed hyperlipidemia: Secondary | ICD-10-CM

## 2016-10-18 DIAGNOSIS — I10 Essential (primary) hypertension: Secondary | ICD-10-CM | POA: Diagnosis not present

## 2016-10-18 NOTE — Progress Notes (Signed)
Clinical Summary Heather Barber is a 70 y.o.female seen today for follow up of the following medical problems.   1. Ventricular tachycardia - remote history per notes, has had no recurrence  - no recent palpitations.   2. ASD repair - reports was done in her 33s, repaired at [facility] - echo 01/2014 shows no active ASD or shunt   3. HTN - checks at home once a week. Typically around 140s/90s - she remains compliant with meds  4. Hyperlipidemia - compliant with pravastatin - 09/2016 TC 234 TG 387 HDL 39 LDL 118 - reports poor diet. Started on fenofibrate and fish oil by pcp  - HgbA1c 6.0  5. Thrombocythemia - followed by heme/onc, on hydrea.   6. Chronic LBBB Past Medical History:  Diagnosis Date  . Atrial septal defect    Surgical repair in 1994  . Chest pain    Normal coronary angiography in 1994 and 2011  . Dizziness   . Essential thrombocythemia ([place]) 06/06/2016  . GERD (gastroesophageal reflux disease)   . Hyperlipidemia   . Hypertension   . LBBB (left bundle [place] Wilz block) 2011  . Palpitations   . Syncope   . Ventricular tachycardia ([name])    Exercise induced     Allergies  Allergen Reactions  . Codeine Nausea And Vomiting  . Morphine     REACTION: GI symptoms     Current Outpatient Prescriptions  Medication Sig Dispense Refill  . aspirin 81 MG tablet Take 81 mg by mouth daily.      . Estradiol (VAGIFEM) 10 MCG TABS vaginal tablet Place 1 tablet (10 mcg total) vaginally 2 (two) times a week. 8 tablet 12  . fenofibrate 160 MG tablet Take 160 mg by mouth daily.     . Fenofibric Acid 105 MG TABS     . fish oil-omega-3 fatty acids 1000 MG capsule Take 1 capsule by mouth 2 (two) times daily.      . furosemide (LASIX) 20 MG tablet TAKE 1 TABLET (20 MG TOTAL) BY MOUTH DAILY. 90 tablet 3  . hydroxyurea (HYDREA) 500 MG capsule Take 1 capsule (500 mg total) by mouth 2 (two) times daily. May take with food to minimize GI side effects. 60 capsule 3  . losartan  (COZAAR) 50 MG tablet TAKE 1 TABLET (50 MG TOTAL) BY MOUTH DAILY. 90 tablet 3  . metoprolol succinate (TOPROL-XL) 50 MG 24 hr tablet TAKE 1 TABLET (50 MG TOTAL) BY MOUTH DAILY. TAKE WITH OR IMMEDIATELY FOLLOWING A MEAL. 30 tablet 11  . Multiple Vitamin (MULTIVITAMIN) tablet Take 1 tablet by mouth daily.      Marland Kitchen omeprazole (PRILOSEC) 20 MG capsule Take 20 mg by mouth daily.      . pravastatin (PRAVACHOL) 80 MG tablet TAKE 1 TABLET DAILY 30 tablet 6   No current facility-administered medications for this visit.      Past Surgical History:  Procedure Laterality Date  . ASD [place] EXCISIONAL BIOPSY     Right and left  . CATARACT EXTRACTION W/PHACO Left 03/11/2013   Procedure: CATARACT EXTRACTION PHACO AND INTRAOCULAR LENS PLACEMENT (IOC);  Surgeon: [name], MD;  Location: [facility];  Service: Ophthalmology;  Laterality: Left;  CDE:  12.20  . TONSILLECTOMY    . TUBAL LIGATION     Bilateral     Allergies  Allergen Reactions  . Codeine Nausea And Vomiting  . Morphine  REACTION: GI symptoms      Family History  Problem Relation Age of Onset  . Cancer Mother   . Stroke Sister   . Cancer Sister   . Heart failure Father   . Coronary artery disease Neg Hx      Social History Ms. Taddei reports that she has never smoked. She has never used smokeless tobacco. Ms. Penado reports that she does not drink alcohol.   Review of Systems CONSTITUTIONAL: No weight loss, fever, chills, weakness or fatigue.  HEENT: Eyes: No visual loss, blurred vision, double vision or yellow sclerae.No hearing loss, sneezing, congestion, runny nose or sore throat.  SKIN: No rash or itching.  CARDIOVASCULAR: per HPI RESPIRATORY: No shortness of breath, cough or sputum.  GASTROINTESTINAL: No anorexia, nausea, vomiting or diarrhea. No abdominal pain or blood.  GENITOURINARY: No burning on urination, no polyuria NEUROLOGICAL: No headache, dizziness, syncope,  paralysis, ataxia, numbness or tingling in the extremities. No change in bowel or bladder control.  MUSCULOSKELETAL: No muscle, back pain, joint pain or stiffness.  LYMPHATICS: No enlarged nodes. No history of splenectomy.  PSYCHIATRIC: No history of depression or anxiety.  ENDOCRINOLOGIC: No reports of sweating, cold or heat intolerance. No polyuria or polydipsia.  Marland Kitchen   Physical Examination Vitals:   10/18/16 1009  BP: 116/78  Pulse: 99   Vitals:   10/18/16 1009  Weight: 171 lb (77.6 kg)  Height: 5\' 3"  (1.6 m)    Gen: resting comfortably, no acute distress HEENT: no scleral icterus, pupils equal round and reactive, no palptable cervical adenopathy,  CV: RRR, no m/r/g, no jvd Resp: Clear to auscultation bilaterally GI: abdomen is soft, non-tender, non-distended, normal bowel sounds, no hepatosplenomegaly MSK: extremities are warm, no edema.  Skin: warm, no rash Neuro:  no focal deficits Psych: appropriate affect   Diagnostic Studies 01/2012 Echo Left ventricle: The cavity size was normal. Wall thickness was normal. Systolic function was mildly to moderately reduced. The estimated ejection fraction was in the range of 40% to 45%. - Ventricular septum: Septal motion showed paradox. - Aortic valve: Mildly calcified annulus. Trileaflet; mildly calcified leaflets. - Mitral valve: Mild to moderate regurgitation. - Left atrium: The atrium was mildly dilated. - Right ventricle: The cavity size was mildly dilated. Wall thickness was normal. - Right atrium: The atrium was mildly dilated. - Atrial septum: No defect or patent foramen ovale was identified. - Pulmonary arteries: PA peak pressure: 33mm Hg (S). - Pericardium, extracardiac: Minimal ascites was noted.    Assessment and Plan   1. Ventricular tachycardia - remote history, no recurrence - continue to monitor. EKG in clinic shows SR, chronic LBBB  2. HTN - at goal, she will continue current meds  3.  Hyperlipidemia - counseled on dietary modifications for her elevated TGs - continue current meds      [name], M.D.

## 2016-10-18 NOTE — Patient Instructions (Signed)

## 2016-11-14 ENCOUNTER — Encounter (HOSPITAL_COMMUNITY): Payer: Self-pay | Admitting: Hematology & Oncology

## 2016-11-14 ENCOUNTER — Ambulatory Visit (HOSPITAL_COMMUNITY): Payer: Medicare Other | Admitting: Hematology & Oncology

## 2016-11-14 ENCOUNTER — Encounter (HOSPITAL_COMMUNITY): Payer: Medicare Other

## 2016-11-14 ENCOUNTER — Encounter (HOSPITAL_COMMUNITY): Payer: Medicare Other | Attending: Hematology & Oncology | Admitting: Hematology & Oncology

## 2016-11-14 ENCOUNTER — Other Ambulatory Visit (HOSPITAL_COMMUNITY): Payer: Medicare Other

## 2016-11-14 VITALS — BP 139/62 | HR 75 | Temp 98.1°F | Resp 20 | Wt 174.2 lb

## 2016-11-14 DIAGNOSIS — D473 Essential (hemorrhagic) thrombocythemia: Secondary | ICD-10-CM | POA: Diagnosis present

## 2016-11-14 DIAGNOSIS — D7589 Other specified diseases of blood and blood-forming organs: Secondary | ICD-10-CM

## 2016-11-14 LAB — CBC WITH DIFFERENTIAL/PLATELET
Basophils Absolute: 0 10*3/uL (ref 0.0–0.1)
Basophils Relative: 0 %
Eosinophils Absolute: 0.1 10*3/uL (ref 0.0–0.7)
Eosinophils Relative: 2 %
HCT: 32.1 % — ABNORMAL LOW (ref 36.0–46.0)
Hemoglobin: 11.5 g/dL — ABNORMAL LOW (ref 12.0–15.0)
Lymphocytes Relative: 48 %
Lymphs Abs: 2.1 10*3/uL (ref 0.7–4.0)
MCH: 42.8 pg — ABNORMAL HIGH (ref 26.0–34.0)
MCHC: 35.8 g/dL (ref 30.0–36.0)
MCV: 119.3 fL — ABNORMAL HIGH (ref 78.0–100.0)
Monocytes Absolute: 0.3 10*3/uL (ref 0.1–1.0)
Monocytes Relative: 7 %
Neutro Abs: 1.8 10*3/uL (ref 1.7–7.7)
Neutrophils Relative %: 43 %
Platelets: 279 10*3/uL (ref 150–400)
RBC: 2.69 MIL/uL — ABNORMAL LOW (ref 3.87–5.11)
RDW: 12.4 % (ref 11.5–15.5)
WBC: 4.3 10*3/uL (ref 4.0–10.5)

## 2016-11-14 LAB — COMPREHENSIVE METABOLIC PANEL
ALT: 22 U/L (ref 14–54)
AST: 20 U/L (ref 15–41)
Albumin: 4.2 g/dL (ref 3.5–5.0)
Alkaline Phosphatase: 63 U/L (ref 38–126)
Anion gap: 8 (ref 5–15)
BUN: 15 mg/dL (ref 6–20)
CO2: 27 mmol/L (ref 22–32)
Calcium: 9.2 mg/dL (ref 8.9–10.3)
Chloride: 102 mmol/L (ref 101–111)
Creatinine, Ser: 0.76 mg/dL (ref 0.44–1.00)
GFR calc Af Amer: >60 mL/min (ref >60)
GFR calc non Af Amer: >60 mL/min (ref >60)
Glucose, Bld: 112 mg/dL — ABNORMAL HIGH (ref 65–99)
Potassium: 3.8 mmol/L (ref 3.5–5.1)
Sodium: 137 mmol/L (ref 135–145)
Total Bilirubin: 0.3 mg/dL (ref 0.3–1.2)
Total Protein: 6.9 g/dL (ref 6.5–8.1)

## 2016-11-14 MED ORDER — HYDROXYUREA 500 MG PO CAPS: 500.0000 mg | ORAL_CAPSULE | Freq: Two times a day (BID) | ORAL | 2 refills | Status: DC

## 2016-11-14 NOTE — Progress Notes (Signed)
[facility] Hematology/Oncology Progress Note  Name: Heather Barber      MRN: [id number]    Date: 11/14/2016 Time:2:34 PM   REFERRING PHYSICIAN:  Dione Housekeeper, MD (Primary Care Provider)  REASON FOR CONSULT:  Transfer of hematology care.   DIAGNOSIS:  JAK2 POSITIVE Essential Thrombocytosis    Thrombocytosis ([place])   10/05/2015 Initial Diagnosis    Essential thrombocythemia ([place])      10/06/2015 -  Chemotherapy    Hydrea         HISTORY OF PRESENT ILLNESS:   Heather Barber is a 71 y.o. female with a medical history significant for atrial septal defect repaired 30+ years ago at [facility], LBBB, hyperlipidemia, HTN who is referred to the [facility] for [place] Essential Thrombocytosis.  She is unaccompanied today.   I personally reviewed and went over labs with the patient.  She takes her hydrea everyday,  one in the morning and one in the night.  States she feels good.   Denies nausea, notes sometimes if she doesn't take her medicine with food she gets nausea. Denies SOB  Denies chest pain, abdominal pain.  Denies blood in stool.  Denies  Bruising or leg swelling.   States she is still working, anywhere from 10-12 hours a day.   She requested a refill for her hydrea.   Review of Systems  Constitutional: Negative.   HENT: Negative.   Eyes: Negative.   Respiratory: Negative.  Negative for shortness of breath.   Cardiovascular: Negative.  Negative for chest pain and leg swelling.  Gastrointestinal: Negative for abdominal pain, blood in stool and nausea.  Genitourinary: Negative.   Musculoskeletal: Negative.   Skin: Negative.   Neurological: Negative.   Endo/Heme/Allergies: Negative.   Psychiatric/Behavioral: Negative.   14 point review of systems was performed and is negative except as detailed under history of present illness and above  PAST MEDICAL HISTORY:   Past Medical History:  Diagnosis Date  . Atrial septal  defect    Surgical repair in 1994  . Chest pain    Normal coronary angiography in 1994 and 2011  . Dizziness   . Essential thrombocythemia ([place]) 06/06/2016  . GERD (gastroesophageal reflux disease)   . Hyperlipidemia   . Hypertension   . LBBB (left bundle branch block) 2011  . Palpitations   . Syncope   . Ventricular tachycardia ([name])    Exercise induced    ALLERGIES: Allergies  Allergen Reactions  . Codeine Nausea And Vomiting  . Morphine     REACTION: GI symptoms      MEDICATIONS: I have reviewed the patient's current medications.    Current Outpatient Prescriptions on File Prior to Visit  Medication Sig Dispense Refill  . aspirin 81 MG tablet Take 81 mg by mouth daily.      . Estradiol (VAGIFEM) 10 MCG TABS vaginal tablet Place 1 tablet (10 mcg total) vaginally 2 (two) times a week. 8 tablet 12  . fenofibrate 160 MG tablet Take 160 mg by mouth daily.     . Fenofibric Acid 105 MG TABS     . fish oil-omega-3 fatty acids 1000 MG capsule Take 1 capsule by mouth 2 (two) times daily.      . furosemide (LASIX) 20 MG tablet TAKE 1 TABLET (20 MG TOTAL) BY MOUTH DAILY. 90 tablet 3  . losartan (COZAAR) 50 MG tablet TAKE 1 TABLET (50 MG TOTAL) BY MOUTH DAILY. 90 tablet  3  . metoprolol succinate (TOPROL-XL) 50 MG 24 hr tablet TAKE 1 TABLET (50 MG TOTAL) BY MOUTH DAILY. TAKE WITH OR IMMEDIATELY FOLLOWING A MEAL. 30 tablet 11  . Multiple Vitamin (MULTIVITAMIN) tablet Take 1 tablet by mouth daily.      Marland Kitchen omeprazole (PRILOSEC) 20 MG capsule Take 20 mg by mouth daily.      . pravastatin (PRAVACHOL) 80 MG tablet TAKE 1 TABLET DAILY 30 tablet 6   No current facility-administered medications on file prior to visit.      PAST SURGICAL HISTORY Past Surgical History:  Procedure Laterality Date  . ASD [place] EXCISIONAL BIOPSY     Right and left  . CATARACT EXTRACTION W/PHACO Left 03/11/2013   Procedure: CATARACT EXTRACTION PHACO AND INTRAOCULAR LENS  PLACEMENT (IOC);  Surgeon: [name], MD;  Location: [facility];  Service: Ophthalmology;  Laterality: Left;  CDE:  12.20  . TONSILLECTOMY    . TUBAL LIGATION     Bilateral    FAMILY HISTORY: Family History  Problem Relation Age of Onset  . Cancer Mother   . Stroke Sister   . Cancer Sister   . Heart failure Father   . Coronary artery disease Neg Hx     SOCIAL HISTORY:  reports that she has never smoked. She has never used smokeless tobacco. She reports that she does not drink alcohol or use drugs. She reports being [place]. 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  Social History   Social History  . Marital status: Married    Spouse name: N/A  . Number of children: N/A  . Years of education: N/A   Occupational History  . Local [employer]    Social History Main Topics  . Smoking status: Never Smoker  . Smokeless tobacco: Never Used  . Alcohol use No  . Drug use: No  . Sexual activity: No   Other Topics Concern  . None   Social History Narrative   Married and resides in [place] with husband and 3 children   Sedentary lifestyle    PERFORMANCE STATUS: The patient's performance status is 0 - Asymptomatic  PHYSICAL EXAM: Most Recent Vital Signs: Blood pressure 139/62, pulse 75, temperature 98.1 F (36.7 C), temperature source Oral, resp. rate 20, weight 174 lb 3.2 oz (79 kg), SpO2 98 %. BP 139/62 (BP Location: Left Arm, Patient Position: Sitting)   Pulse 75   Temp 98.1 F (36.7 C) (Oral)   Resp 20   Wt 174 lb 3.2 oz (79 kg)   SpO2 98%   BMI 30.86 kg/m   Physical Exam  Constitutional: She is oriented to person, place, and time and well-developed, well-nourished, and in no distress.  HENT:  Head: Normocephalic and atraumatic.  Eyes: Conjunctivae and EOM are normal. Pupils are equal, round, and reactive to light. No scleral icterus.  Neck: Normal range of motion. Neck supple.  Cardiovascular: Normal rate, regular  rhythm and normal heart sounds.   Pulmonary/Chest: Effort normal and breath sounds normal.  Abdominal: Soft. Bowel sounds are normal. She exhibits no distension and no mass. There is no tenderness. There is no rebound and no guarding.  Musculoskeletal: Normal range of motion.  Lymphadenopathy:    She has no cervical adenopathy.  Neurological: She is alert and oriented to person, place, and time. Gait normal.  Skin: Skin is warm and dry.  Psychiatric: Mood, memory, affect  and judgment normal.  Nursing note and vitals reviewed.   LABORATORY DATA:  Results for orders placed or performed in visit on 11/14/16 (from the past 48 hour(s))  Comprehensive metabolic panel     Status: Abnormal   Collection Time: 11/14/16 12:44 PM  Result Value Ref Range   Sodium 137 135 - 145 mmol/L   Potassium 3.8 3.5 - 5.1 mmol/L   Chloride 102 101 - 111 mmol/L   CO2 27 22 - 32 mmol/L   Glucose, Bld 112 (H) 65 - 99 mg/dL   BUN 15 6 - 20 mg/dL   Creatinine, Ser 0.76 0.44 - 1.00 mg/dL   Calcium 9.2 8.9 - 10.3 mg/dL   Total Protein 6.9 6.5 - 8.1 g/dL   Albumin 4.2 3.5 - 5.0 g/dL   AST 20 15 - 41 U/L   ALT 22 14 - 54 U/L   Alkaline Phosphatase 63 38 - 126 U/L   Total Bilirubin 0.3 0.3 - 1.2 mg/dL   GFR calc non Af Amer >60 >60 mL/min   GFR calc Af Amer >60 >60 mL/min    Comment: (NOTE) The eGFR has been calculated using the CKD EPI equation. This calculation has not been validated in all clinical situations. eGFR's persistently <60 mL/min signify possible Chronic Kidney Disease.    Anion gap 8 5 - 15        Results for IZZY, DOUBEK (MRN [id number]) as of 11/30/2016 14:46  Ref. Range 11/14/2016 12:44  Sodium Latest Ref Range: 135 - 145 mmol/L 137  Potassium Latest Ref Range: 3.5 - 5.1 mmol/L 3.8  Chloride Latest Ref Range: 101 - 111 mmol/L 102  CO2 Latest Ref Range: 22 - 32 mmol/L 27  Glucose Latest Ref Range: 65 - 99 mg/dL 112 (H)  BUN Latest Ref Range: 6 - 20 mg/dL 15  Creatinine Latest Ref  Range: 0.44 - 1.00 mg/dL 0.76  Calcium Latest Ref Range: 8.9 - 10.3 mg/dL 9.2  Anion gap Latest Ref Range: 5 - 15  8  Alkaline Phosphatase Latest Ref Range: 38 - 126 U/L 63  Albumin Latest Ref Range: 3.5 - 5.0 g/dL 4.2  AST Latest Ref Range: 15 - 41 U/L 20  ALT Latest Ref Range: 14 - 54 U/L 22  Total Protein Latest Ref Range: 6.5 - 8.1 g/dL 6.9  Total Bilirubin Latest Ref Range: 0.3 - 1.2 mg/dL 0.3  EGFR (African American) Latest Ref Range: >60 mL/min >60  EGFR (Non-African Amer.) Latest Ref Range: >60 mL/min >60  WBC Latest Ref Range: 4.0 - 10.5 K/uL 4.3  RBC Latest Ref Range: 3.87 - 5.11 MIL/uL 2.69 (L)  Hemoglobin Latest Ref Range: 12.0 - 15.0 g/dL 11.5 (L)  HCT Latest Ref Range: 36.0 - 46.0 % 32.1 (L)  MCV Latest Ref Range: 78.0 - 100.0 fL 119.3 (H)  MCH Latest Ref Range: 26.0 - 34.0 pg 42.8 (H)  MCHC Latest Ref Range: 30.0 - 36.0 g/dL 35.8  RDW Latest Ref Range: 11.5 - 15.5 % 12.4  Platelets Latest Ref Range: 150 - 400 K/uL 279  Neutrophils Latest Units: % 43  Lymphocytes Latest Units: % 48  Monocytes Relative Latest Units: % 7  Eosinophil Latest Units: % 2  Basophil Latest Units: % 0  NEUT# Latest Ref Range: 1.7 - 7.7 K/uL 1.8  Lymphocyte # Latest Ref Range: 0.7 - 4.0 K/uL 2.1  Monocyte # Latest Ref Range: 0.1 - 1.0 K/uL 0.3  Eosinophils Absolute Latest Ref Range: 0.0 - 0.7 K/uL 0.1  Basophils Absolute Latest Ref Range: 0.0 - 0.1 K/uL 0.0    RADIOLOGY: I have reviewed the images below and agree with the reported results  No results found.    Study Result   CLINICAL DATA:  Screening.  EXAM: 2D DIGITAL SCREENING BILATERAL MAMMOGRAM WITH CAD AND ADJUNCT TOMO  COMPARISON:  Previous exam(s).  ACR Breast Density Category b: There are scattered areas of fibroglandular density.  FINDINGS: There are no findings suspicious for malignancy. Images were processed with CAD.  IMPRESSION: No mammographic evidence of malignancy. A result letter of this screening  mammogram will be mailed directly to the patient.  RECOMMENDATION: Screening mammogram in one year. (Code:SM-B-01Y)  BI-RADS CATEGORY  1: Negative.   Electronically Signed   By: Curlene Dolphin M.D.   On: 10/03/2016 13:37     ASSESSMENT/PLAN:  Essential thrombocythemia  Macrocytosis secondary to hydrea JAK 2 positive  JAK 2 POSITIVE essential thrombocytosis, on Hydrea 500 mg daily twice daily. She is tolerating this quite well. Goal platelet count is less than 450K. Platelet count today was 279K. Other counts are WNL. Macrocytosis is secondary to hydrea.   Labs today: CBC diff, CMET. Results are noted above.  Labs every 4 weeks: CBC diff, CMET.  She notes that she would prefer to have labs performed at her primary care provider's office and then faxed to [place] due to convenience.  This is absolutely reasonable.  Order is written for labs monthly at Dr. Murrell Redden office.  Labs in 4 months at the clinic: CBC diff, CMET.  I have refilled her Hydrea.  Return in 4 months for follow-up. At follow-up if counts are stable she can certainly discontinue monthly labs at follow-up  MEDICATIONS PRESCRIBED THIS ENCOUNTER: Meds ordered this encounter  Medications  . hydroxyurea (HYDREA) 500 MG capsule    Sig: Take 1 capsule (500 mg total) by mouth 2 (two) times daily. May take with food to minimize GI side effects.    Dispense:  60 capsule    Refill:  2    All questions were answered. The patient knows to call the clinic with any problems, questions or concerns. We can certainly see the patient much sooner if necessary.  This document serves as a record of services personally performed by Ancil Linsey, MD. It was created on her behalf by Shirlean Mylar, a trained medical scribe. The creation of this record is based on the scribe's personal observations and the provider's statements to them. This document has been checked and approved by the attending provider.  I have reviewed the  above documentation for accuracy and completeness and I agree with the above.  This note is electronically signed [id number] Cyril Mourning, MD  11/14/2016 2:34 PM

## 2016-11-14 NOTE — Patient Instructions (Addendum)
Heather Barber at [facility] Discharge Instructions  RECOMMENDATIONS MADE BY THE CONSULTANT AND ANY TEST RESULTS WILL BE SENT TO YOUR REFERRING PHYSICIAN.  You were seen by Dr. Whitney Muse today. Refill of Hydrea given. Return in 4 months for labs and follow up.    Thank you for choosing [place] [facility] to provide your oncology and hematology care.  To afford each patient quality time with our provider, please arrive at least 15 minutes before your scheduled appointment time.    If you have a lab appointment with the [name] please come in thru the  Main Entrance and check in at the main information desk  You need to re-schedule your appointment should you arrive 10 or more minutes late.  We strive to give you quality time with our providers, and arriving late affects you and other patients whose appointments are after yours.  Also, if you no show three or more times for appointments you may be dismissed from the clinic at the providers discretion.     Again, thank you for choosing [facility].  Our hope is that these requests will decrease the amount of time that you wait before being seen by our physicians.       _____________________________________________________________  Should you have questions after your visit to [facility], please contact our office at [phone] between the hours of 8:30 a.m. and 4:30 p.m.  Voicemails left after 4:30 p.m. will not be returned until the following business day.  For prescription refill requests, have your pharmacy contact our office.       Resources For Cancer Patients and their Caregivers ? American Cancer Society: Can assist with transportation, wigs, general needs, runs Look Good Feel Better.        (249)092-4939 ? Cancer Care: Provides financial assistance, online support groups, medication/co-pay assistance.  1-800-813-HOPE (702) 619-5892) ? [facility] Assists [place] cancer patients and their families through emotional , educational and financial support.  919 224 4829 ? [place] DSS Where to apply for food stamps, Medicaid and utility assistance. (534)446-6535 ? RCATS: Transportation to medical appointments. 671 662 2111 ? Social Security Administration: May apply for disability if have a Stage IV cancer. 831-723-6635 613-759-0543 ? [employer], Disability and Transit Services: Assists with nutrition, care and transit needs. [place] Support Programs: @10RELATIVEDAYS @ > Cancer Support Group  2nd Tuesday of the month 1pm-2pm, Journey Room  > Creative Journey  3rd Tuesday of the month 1130am-1pm, Journey Room  > Look Good Feel Better  1st Wednesday of the month 10am-12 noon, Journey Room (Call [place] to register [phone])

## 2016-11-30 ENCOUNTER — Encounter (HOSPITAL_COMMUNITY): Payer: Self-pay | Admitting: Hematology & Oncology

## 2016-12-01 ENCOUNTER — Telehealth (HOSPITAL_COMMUNITY): Payer: Self-pay

## 2016-12-01 NOTE — Telephone Encounter (Signed)
Received labs from PCP's office. Reviewed with PA who ordered patient to continue same dose of Hydrea. Notified patients husband with understanding verbalized. If any questions call cancer center.

## 2017-02-22 ENCOUNTER — Other Ambulatory Visit (HOSPITAL_COMMUNITY): Payer: Self-pay | Admitting: Hematology & Oncology

## 2017-02-22 DIAGNOSIS — D473 Essential (hemorrhagic) thrombocythemia: Secondary | ICD-10-CM

## 2017-03-13 ENCOUNTER — Other Ambulatory Visit (HOSPITAL_COMMUNITY): Payer: Self-pay | Admitting: *Deleted

## 2017-03-13 DIAGNOSIS — Z961 Presence of intraocular lens: Secondary | ICD-10-CM | POA: Insufficient documentation

## 2017-03-13 DIAGNOSIS — D473 Essential (hemorrhagic) thrombocythemia: Secondary | ICD-10-CM

## 2017-03-13 DIAGNOSIS — D75839 Thrombocytosis, unspecified: Secondary | ICD-10-CM

## 2017-03-14 ENCOUNTER — Encounter (HOSPITAL_COMMUNITY): Payer: Medicare Other | Attending: Oncology | Admitting: Oncology

## 2017-03-14 ENCOUNTER — Encounter (HOSPITAL_COMMUNITY): Payer: Self-pay

## 2017-03-14 ENCOUNTER — Encounter (HOSPITAL_COMMUNITY): Payer: Medicare Other

## 2017-03-14 VITALS — BP 135/63 | HR 79 | Temp 98.5°F | Resp 16 | Wt 174.4 lb

## 2017-03-14 DIAGNOSIS — D473 Essential (hemorrhagic) thrombocythemia: Secondary | ICD-10-CM | POA: Diagnosis present

## 2017-03-14 DIAGNOSIS — D539 Nutritional anemia, unspecified: Secondary | ICD-10-CM | POA: Diagnosis not present

## 2017-03-14 DIAGNOSIS — D75839 Thrombocytosis, unspecified: Secondary | ICD-10-CM

## 2017-03-14 LAB — CBC WITH DIFFERENTIAL/PLATELET
Basophils Absolute: 0 10*3/uL (ref 0.0–0.1)
Basophils Relative: 0 %
Eosinophils Absolute: 0.1 10*3/uL (ref 0.0–0.7)
Eosinophils Relative: 1 %
HCT: 33.3 % — ABNORMAL LOW (ref 36.0–46.0)
Hemoglobin: 11.8 g/dL — ABNORMAL LOW (ref 12.0–15.0)
Lymphocytes Relative: 47 %
Lymphs Abs: 2 10*3/uL (ref 0.7–4.0)
MCH: 41.8 pg — ABNORMAL HIGH (ref 26.0–34.0)
MCHC: 35.4 g/dL (ref 30.0–36.0)
MCV: 118.1 fL — ABNORMAL HIGH (ref 78.0–100.0)
Monocytes Absolute: 0.3 10*3/uL (ref 0.1–1.0)
Monocytes Relative: 8 %
Neutro Abs: 1.9 10*3/uL (ref 1.7–7.7)
Neutrophils Relative %: 44 %
Platelets: 250 10*3/uL (ref 150–400)
RBC: 2.82 MIL/uL — ABNORMAL LOW (ref 3.87–5.11)
RDW: 12.6 % (ref 11.5–15.5)
WBC: 4.3 10*3/uL (ref 4.0–10.5)

## 2017-03-14 LAB — COMPREHENSIVE METABOLIC PANEL
ALT: 22 U/L (ref 14–54)
AST: 21 U/L (ref 15–41)
Albumin: 4.2 g/dL (ref 3.5–5.0)
Alkaline Phosphatase: 59 U/L (ref 38–126)
Anion gap: 9 (ref 5–15)
BUN: 18 mg/dL (ref 6–20)
CO2: 26 mmol/L (ref 22–32)
Calcium: 9.5 mg/dL (ref 8.9–10.3)
Chloride: 104 mmol/L (ref 101–111)
Creatinine, Ser: 0.89 mg/dL (ref 0.44–1.00)
GFR calc Af Amer: >60 mL/min (ref >60)
GFR calc non Af Amer: >60 mL/min (ref >60)
Glucose, Bld: 99 mg/dL (ref 65–99)
Potassium: 4.2 mmol/L (ref 3.5–5.1)
Sodium: 139 mmol/L (ref 135–145)
Total Bilirubin: 0.5 mg/dL (ref 0.3–1.2)
Total Protein: 7.1 g/dL (ref 6.5–8.1)

## 2017-03-14 NOTE — Patient Instructions (Signed)
[facility] at [facility] Discharge Instructions  RECOMMENDATIONS MADE BY THE CONSULTANT AND ANY TEST RESULTS WILL BE SENT TO YOUR REFERRING PHYSICIAN.  You were seen today by Dr. Louise Zhou    Thank you for choosing [facility] at [facility] to provide your oncology and hematology care.  To afford each patient quality time with our provider, please arrive at least 15 minutes before your scheduled appointment time.    If you have a lab appointment with the Cancer Center please come in thru the  Main Entrance and check in at the main information desk  You need to re-schedule your appointment should you arrive 10 or more minutes late.  We strive to give you quality time with our providers, and arriving late affects you and other patients whose appointments are after yours.  Also, if you no show three or more times for appointments you may be dismissed from the clinic at the providers discretion.     Again, thank you for choosing [facility].  Our hope is that these requests will decrease the amount of time that you wait before being seen by our physicians.       _____________________________________________________________  Should you have questions after your visit to [facility], please contact our office at [phone] between the hours of 8:30 a.m. and 4:30 p.m.  Voicemails left after 4:30 p.m. will not be returned until the following business day.  For prescription refill requests, have your pharmacy contact our office.       Resources For Cancer Patients and their Caregivers ? American Cancer Society: Can assist with transportation, wigs, general needs, runs Look Good Feel Better.        1-888-227-6333 ? Cancer Care: Provides financial assistance, online support groups, medication/co-pay assistance.  1-800-813-HOPE (4673) ? [facility] Assists [place] cancer patients and their  families through emotional , educational and financial support.  336-427-4357 ? [place] DSS Where to apply for food stamps, Medicaid and utility assistance. 336-342-1394 ? RCATS: Transportation to medical appointments. 336-347-2287 ? Social Security Administration: May apply for disability if have a Stage IV cancer. 336-342-7796 1-800-772-1213 ? [place] Aging, Disability and Transit Services: Assists with nutrition, care and transit needs. 336-349-2343  Cancer Center Support Programs: @10RELATIVEDAYS@ > Cancer Support Group  2nd Tuesday of the month 1pm-2pm, Journey Room  > Creative Journey  3rd Tuesday of the month 1130am-1pm, Journey Room  > Look Good Feel Better  1st Wednesday of the month 10am-12 noon, Journey Room (Call American Cancer Society to register 1-800-395-5775)    

## 2017-03-14 NOTE — Progress Notes (Signed)
[facility] Hematology/Oncology Progress Note  Name: Heather Barber      MRN: [id number]    Date: 03/14/2017 Time:1:26 PM   REFERRING PHYSICIAN:  Dione Housekeeper, MD (Primary Care Provider)    DIAGNOSIS:  JAK2 POSITIVE Essential Thrombocytosis    Thrombocytosis ([place])   10/05/2015 Initial Diagnosis    Essential thrombocythemia ([place])      10/06/2015 -  Chemotherapy    Hydrea         HISTORY OF PRESENT ILLNESS:   Heather Barber is a 71 y.o. female with a medical history significant for atrial septal defect repaired 30+ years ago at [facility], LBBB, hyperlipidemia, HTN who is referred to the [facility] for [place] Essential Thrombocytosis.  She is unaccompanied today.   I personally reviewed and went over labs with the patient.  Patient states that she has been doing well. She has been taking her hydrea every day as prescribed. She states it occasionally makes her nauseated if she takes it on an empty stomach. She denies any chest pain, palpitations, shortness of breath, abdominal pain, recent infections, diarrhea, constipation, or focal weakness.   Review of Systems  Constitutional: Negative.   HENT: Negative.   Eyes: Negative.   Respiratory: Negative.  Negative for shortness of breath.   Cardiovascular: Negative.  Negative for chest pain and leg swelling.  Gastrointestinal: Negative for abdominal pain, blood in stool and nausea.  Genitourinary: Negative.   Musculoskeletal: Negative.   Skin: Negative.   Neurological: Negative.   Endo/Heme/Allergies: Negative.   Psychiatric/Behavioral: Negative.   14 point review of systems was performed and is negative except as detailed under history of present illness and above  PAST MEDICAL HISTORY:   Past Medical History:  Diagnosis Date  . Atrial septal defect    Surgical repair in 1994  . Chest pain    Normal coronary angiography in 1994 and 2011  . Dizziness   . Essential thrombocythemia  ([place]) 06/06/2016  . GERD (gastroesophageal reflux disease)   . Hyperlipidemia   . Hypertension   . LBBB (left bundle branch block) 2011  . Palpitations   . Syncope   . Ventricular tachycardia ([name])    Exercise induced    ALLERGIES: Allergies  Allergen Reactions  . Codeine Nausea And Vomiting  . Morphine     REACTION: GI symptoms      MEDICATIONS: I have reviewed the patient's current medications.    Current Outpatient Prescriptions on File Prior to Visit  Medication Sig Dispense Refill  . aspirin 81 MG tablet Take 81 mg by mouth daily.      . Estradiol (VAGIFEM) 10 MCG TABS vaginal tablet Place 1 tablet (10 mcg total) vaginally 2 (two) times a week. 8 tablet 12  . fenofibrate 160 MG tablet Take 160 mg by mouth daily.     . Fenofibric Acid 105 MG TABS     . fish oil-omega-3 fatty acids 1000 MG capsule Take 1 capsule by mouth 2 (two) times daily.      . furosemide (LASIX) 20 MG tablet TAKE 1 TABLET (20 MG TOTAL) BY MOUTH DAILY. 90 tablet 3  . hydroxyurea (HYDREA) 500 MG capsule TAKE ONE CAPSULE TWICE A DAY . MAY TAKE WITH FOOD TO MINIMIZE GI SIDE EFFECTS 60 capsule 2  . losartan (COZAAR) 50 MG tablet TAKE 1 TABLET (50 MG TOTAL) BY MOUTH DAILY. 90 tablet 3  . metoprolol succinate (TOPROL-XL) 50 MG 24 hr  tablet TAKE 1 TABLET (50 MG TOTAL) BY MOUTH DAILY. TAKE WITH OR IMMEDIATELY FOLLOWING A MEAL. 30 tablet 11  . Multiple Vitamin (MULTIVITAMIN) tablet Take 1 tablet by mouth daily.      Marland Kitchen omeprazole (PRILOSEC) 20 MG capsule Take 20 mg by mouth daily.      . pravastatin (PRAVACHOL) 80 MG tablet TAKE 1 TABLET DAILY 30 tablet 6   No current facility-administered medications on file prior to visit.      PAST SURGICAL HISTORY Past Surgical History:  Procedure Laterality Date  . ASD [place] EXCISIONAL BIOPSY     Right and left  . CATARACT EXTRACTION W/PHACO Left 03/11/2013   Procedure: CATARACT EXTRACTION PHACO AND INTRAOCULAR LENS PLACEMENT (IOC);   Surgeon: [name], MD;  Location: [facility];  Service: Ophthalmology;  Laterality: Left;  CDE:  12.20  . TONSILLECTOMY    . TUBAL LIGATION     Bilateral    FAMILY HISTORY: Family History  Problem Relation Age of Onset  . Cancer Mother   . Stroke Sister   . Cancer Sister   . Heart failure Father   . Coronary artery disease Neg Hx     SOCIAL HISTORY:  reports that she has never smoked. She has never used smokeless tobacco. She reports that she does not drink alcohol or use drugs. She reports being [place]. 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  Social History   Social History  . Marital status: Married    Spouse name: N/A  . Number of children: N/A  . Years of education: N/A   Occupational History  . Local printing [employer]    Social History Main Topics  . Smoking status: Never Smoker  . Smokeless tobacco: Never Used  . Alcohol use No  . Drug use: No  . Sexual activity: No   Other Topics Concern  . Not on file   Social History Narrative   Married and resides in [place] with husband and 3 children   Sedentary lifestyle    PERFORMANCE STATUS: The patient's performance status is 0 - Asymptomatic  PHYSICAL EXAM: Most Recent Vital Signs:   Vitals:   03/14/17 1334  BP: 135/63  Pulse: 79  Resp: 16  Temp: 98.5 F (36.9 C)    Physical Exam  Constitutional: She is oriented to person, place, and time and well-developed, well-nourished, and in no distress. No distress.  HENT:  Head: Normocephalic and atraumatic.  Mouth/Throat: No oropharyngeal exudate.  Eyes: Conjunctivae and EOM are normal. Pupils are equal, round, and reactive to light. No scleral icterus.  Neck: Normal range of motion. Neck supple. No JVD present.  Cardiovascular: Normal rate, regular rhythm and normal heart sounds.  Exam reveals no gallop and no friction rub.   No murmur heard. Pulmonary/Chest: Effort normal and breath sounds normal. No  respiratory distress. She has no wheezes. She has no rales.  Abdominal: Soft. Bowel sounds are normal. She exhibits no distension and no mass. There is no tenderness. There is no rebound and no guarding.  Musculoskeletal: Normal range of motion. She exhibits no edema or tenderness.  Lymphadenopathy:    She has no cervical adenopathy.  Neurological: She is alert and oriented to person, place, and time. No cranial nerve deficit. Gait normal.  Skin: Skin is warm and dry. No rash noted. No erythema. No pallor.  Psychiatric: Mood, memory, affect and judgment normal.  Nursing note and vitals reviewed.   LABORATORY DATA:  Results for orders placed or performed in visit on 03/14/17 (from the past 48 hour(s))  CBC with Differential     Status: Abnormal   Collection Time: 03/14/17 12:50 PM  Result Value Ref Range   WBC 4.3 4.0 - 10.5 K/uL   RBC 2.82 (L) 3.87 - 5.11 MIL/uL   Hemoglobin 11.8 (L) 12.0 - 15.0 g/dL   HCT 33.3 (L) 36.0 - 46.0 %   MCV 118.1 (H) 78.0 - 100.0 fL   MCH 41.8 (H) 26.0 - 34.0 pg   MCHC 35.4 30.0 - 36.0 g/dL   RDW 12.6 11.5 - 15.5 %   Platelets 250 150 - 400 K/uL   Neutrophils Relative % 44 %   Neutro Abs 1.9 1.7 - 7.7 K/uL   Lymphocytes Relative 47 %   Lymphs Abs 2.0 0.7 - 4.0 K/uL   Monocytes Relative 8 %   Monocytes Absolute 0.3 0.1 - 1.0 K/uL   Eosinophils Relative 1 %   Eosinophils Absolute 0.1 0.0 - 0.7 K/uL   Basophils Relative 0 %   Basophils Absolute 0.0 0.0 - 0.1 K/uL   RBC Morphology MORPHOLOGY UNREMARKABLE   Comprehensive metabolic panel     Status: None   Collection Time: 03/14/17 12:50 PM  Result Value Ref Range   Sodium 139 135 - 145 mmol/L   Potassium 4.2 3.5 - 5.1 mmol/L   Chloride 104 101 - 111 mmol/L   CO2 26 22 - 32 mmol/L   Glucose, Bld 99 65 - 99 mg/dL   BUN 18 6 - 20 mg/dL   Creatinine, Ser 0.89 0.44 - 1.00 mg/dL   Calcium 9.5 8.9 - 10.3 mg/dL   Total Protein 7.1 6.5 - 8.1 g/dL   Albumin 4.2 3.5 - 5.0 g/dL   AST 21 15 - 41 U/L   ALT  22 14 - 54 U/L   Alkaline Phosphatase 59 38 - 126 U/L   Total Bilirubin 0.5 0.3 - 1.2 mg/dL   GFR calc non Af Amer >60 >60 mL/min   GFR calc Af Amer >60 >60 mL/min    Comment: (NOTE) The eGFR has been calculated using the CKD EPI equation. This calculation has not been validated in all clinical situations. eGFR's persistently <60 mL/min signify possible Chronic Kidney Disease.    Anion gap 9 5 - 15        Results for DEBBRA, DIGIULIO (MRN [id number]) as of 11/30/2016 14:46  Ref. Range 11/14/2016 12:44  Sodium Latest Ref Range: 135 - 145 mmol/L 137  Potassium Latest Ref Range: 3.5 - 5.1 mmol/L 3.8  Chloride Latest Ref Range: 101 - 111 mmol/L 102  CO2 Latest Ref Range: 22 - 32 mmol/L 27  Glucose Latest Ref Range: 65 - 99 mg/dL 112 (H)  BUN Latest Ref Range: 6 - 20 mg/dL 15  Creatinine Latest Ref Range: 0.44 - 1.00 mg/dL 0.76  Calcium Latest Ref Range: 8.9 - 10.3 mg/dL 9.2  Anion gap Latest Ref Range: 5 - 15  8  Alkaline Phosphatase Latest Ref Range: 38 - 126 U/L 63  Albumin Latest Ref Range: 3.5 - 5.0 g/dL 4.2  AST Latest Ref Range: 15 - 41 U/L 20  ALT Latest Ref Range: 14 - 54 U/L 22  Total Protein Latest Ref Range: 6.5 - 8.1 g/dL 6.9  Total Bilirubin Latest Ref Range: 0.3 - 1.2 mg/dL 0.3  EGFR (African American) Latest Ref Range: >60 mL/min >60  EGFR (Non-African  Amer.) Latest Ref Range: >60 mL/min >60  WBC Latest Ref Range: 4.0 - 10.5 K/uL 4.3  RBC Latest Ref Range: 3.87 - 5.11 MIL/uL 2.69 (L)  Hemoglobin Latest Ref Range: 12.0 - 15.0 g/dL 11.5 (L)  HCT Latest Ref Range: 36.0 - 46.0 % 32.1 (L)  MCV Latest Ref Range: 78.0 - 100.0 fL 119.3 (H)  MCH Latest Ref Range: 26.0 - 34.0 pg 42.8 (H)  MCHC Latest Ref Range: 30.0 - 36.0 g/dL 35.8  RDW Latest Ref Range: 11.5 - 15.5 % 12.4  Platelets Latest Ref Range: 150 - 400 K/uL 279  Neutrophils Latest Units: % 43  Lymphocytes Latest Units: % 48  Monocytes Relative Latest Units: % 7  Eosinophil Latest Units: % 2  Basophil Latest  Units: % 0  NEUT# Latest Ref Range: 1.7 - 7.7 K/uL 1.8  Lymphocyte # Latest Ref Range: 0.7 - 4.0 K/uL 2.1  Monocyte # Latest Ref Range: 0.1 - 1.0 K/uL 0.3  Eosinophils Absolute Latest Ref Range: 0.0 - 0.7 K/uL 0.1  Basophils Absolute Latest Ref Range: 0.0 - 0.1 K/uL 0.0    RADIOLOGY: I have reviewed the images below and agree with the reported results  No results found.    Study Result   CLINICAL DATA:  Screening.  EXAM: 2D DIGITAL SCREENING BILATERAL MAMMOGRAM WITH CAD AND ADJUNCT TOMO  COMPARISON:  Previous exam(s).  ACR Breast Density Category b: There are scattered areas of fibroglandular density.  FINDINGS: There are no findings suspicious for malignancy. Images were processed with CAD.  IMPRESSION: No mammographic evidence of malignancy. A result letter of this screening mammogram will be mailed directly to the patient.  RECOMMENDATION: Screening mammogram in one year. (Code:SM-B-01Y)  BI-RADS CATEGORY  1: Negative.   Electronically Signed   By: Curlene Dolphin M.D.   On: 10/03/2016 13:37     ASSESSMENT/PLAN:  JAK 2 positiveEssential Thrombocytosis Macrocytosis secondary to hydrea Mild macrocytic anemia.   Patient's platelet count has been very stable on her current dose of Hydrea. Continue Hydrea 500 mg by mouth twice a day. Goal is to maintain platelet count less than 450 K. She has a mild macrocytic anemia likely secondary to Hydrea. Continue daily aspirin for thrombosis prevention. Patient states she will have her lab work performed at her PCPs office in 3 months, and will have a copy sent to [place] for review. She currently does not need a refill of her Hydrea. Return to clinic in 6 months for follow-up with CBC, CMP.   All questions were answered. The patient knows to call the clinic with any problems, questions or concerns. We can certainly see the patient much sooner if necessary.    This note is electronically signed OE:[id number] Talbert Cage, MD   03/14/2017 1:26 PM

## 2017-05-27 ENCOUNTER — Other Ambulatory Visit (HOSPITAL_COMMUNITY): Payer: Self-pay | Admitting: Adult Health

## 2017-05-27 DIAGNOSIS — D473 Essential (hemorrhagic) thrombocythemia: Secondary | ICD-10-CM

## 2017-07-14 ENCOUNTER — Other Ambulatory Visit: Payer: Self-pay | Admitting: Cardiology

## 2017-07-26 ENCOUNTER — Other Ambulatory Visit: Payer: Self-pay | Admitting: Cardiology

## 2017-08-24 ENCOUNTER — Other Ambulatory Visit: Payer: Self-pay | Admitting: Cardiology

## 2017-08-25 ENCOUNTER — Other Ambulatory Visit (HOSPITAL_COMMUNITY): Payer: Self-pay | Admitting: Adult Health

## 2017-08-25 DIAGNOSIS — D473 Essential (hemorrhagic) thrombocythemia: Secondary | ICD-10-CM

## 2017-09-18 ENCOUNTER — Encounter (HOSPITAL_COMMUNITY): Payer: Medicare Other | Attending: Oncology

## 2017-09-18 ENCOUNTER — Encounter (HOSPITAL_BASED_OUTPATIENT_CLINIC_OR_DEPARTMENT_OTHER): Payer: Medicare Other | Admitting: Oncology

## 2017-09-18 ENCOUNTER — Encounter (HOSPITAL_COMMUNITY): Payer: Self-pay | Admitting: Oncology

## 2017-09-18 VITALS — BP 133/57 | HR 84 | Temp 97.7°F | Resp 16 | Wt 177.0 lb

## 2017-09-18 DIAGNOSIS — D473 Essential (hemorrhagic) thrombocythemia: Secondary | ICD-10-CM | POA: Diagnosis present

## 2017-09-18 DIAGNOSIS — D539 Nutritional anemia, unspecified: Secondary | ICD-10-CM | POA: Diagnosis not present

## 2017-09-18 DIAGNOSIS — D75839 Thrombocytosis, unspecified: Secondary | ICD-10-CM

## 2017-09-18 LAB — COMPREHENSIVE METABOLIC PANEL
ALT: 22 U/L (ref 14–54)
AST: 26 U/L (ref 15–41)
Albumin: 4.5 g/dL (ref 3.5–5.0)
Alkaline Phosphatase: 63 U/L (ref 38–126)
Anion gap: 8 (ref 5–15)
BUN: 18 mg/dL (ref 6–20)
CO2: 26 mmol/L (ref 22–32)
Calcium: 9.4 mg/dL (ref 8.9–10.3)
Chloride: 104 mmol/L (ref 101–111)
Creatinine, Ser: 0.88 mg/dL (ref 0.44–1.00)
GFR calc Af Amer: >60 mL/min (ref >60)
GFR calc non Af Amer: >60 mL/min (ref >60)
Glucose, Bld: 111 mg/dL — ABNORMAL HIGH (ref 65–99)
Potassium: 4.2 mmol/L (ref 3.5–5.1)
Sodium: 138 mmol/L (ref 135–145)
Total Bilirubin: 0.5 mg/dL (ref 0.3–1.2)
Total Protein: 7.2 g/dL (ref 6.5–8.1)

## 2017-09-18 LAB — CBC WITH DIFFERENTIAL/PLATELET
Basophils Absolute: 0 10*3/uL (ref 0.0–0.1)
Basophils Relative: 1 %
Eosinophils Absolute: 0.1 10*3/uL (ref 0.0–0.7)
Eosinophils Relative: 2 %
HCT: 35 % — ABNORMAL LOW (ref 36.0–46.0)
Hemoglobin: 11.8 g/dL — ABNORMAL LOW (ref 12.0–15.0)
Lymphocytes Relative: 49 %
Lymphs Abs: 2.2 10*3/uL (ref 0.7–4.0)
MCH: 40.5 pg — ABNORMAL HIGH (ref 26.0–34.0)
MCHC: 33.7 g/dL (ref 30.0–36.0)
MCV: 120.3 fL — ABNORMAL HIGH (ref 78.0–100.0)
Monocytes Absolute: 0.2 10*3/uL (ref 0.1–1.0)
Monocytes Relative: 5 %
Neutro Abs: 1.9 10*3/uL (ref 1.7–7.7)
Neutrophils Relative %: 44 %
Platelets: 278 10*3/uL (ref 150–400)
RBC: 2.91 MIL/uL — ABNORMAL LOW (ref 3.87–5.11)
RDW: 12.5 % (ref 11.5–15.5)
WBC: 4.4 10*3/uL (ref 4.0–10.5)

## 2017-09-18 NOTE — Progress Notes (Signed)
[facility] Hematology/Oncology Progress Note  Name: Heather Barber      MRN: [id number]    Date: 09/18/2017 Time:1:38 PM   REFERRING PHYSICIAN:  Dione Housekeeper, MD (Primary Care Provider)    DIAGNOSIS:  JAK2 POSITIVE Essential Thrombocytosis    Thrombocytosis ([place])   10/05/2015 Initial Diagnosis    Essential thrombocythemia ([place])      10/06/2015 -  Chemotherapy    Hydrea         HISTORY OF PRESENT ILLNESS:   Heather Barber is a 71 y.o. female with a medical history significant for atrial septal defect repaired 30+ years ago at [facility], LBBB, hyperlipidemia, HTN who is referred to the [facility] for [place] Essential Thrombocytosis.   Patient states that she has been doing well. She has been taking her hydrea every day as prescribed. She states it occasionally makes her nauseated if she takes it on an empty stomach. She denies any fatigue, chest pain, palpitations, shortness of breath, abdominal pain, recent infections, diarrhea, constipation, or focal weakness. She continues to work full time and denies any restrictions on her ADLS.   Review of Systems  Constitutional: Negative.   HENT: Negative.   Eyes: Negative.   Respiratory: Negative.  Negative for shortness of breath.   Cardiovascular: Negative.  Negative for chest pain and leg swelling.  Gastrointestinal: Negative for abdominal pain, blood in stool and nausea.  Genitourinary: Negative.   Musculoskeletal: Negative.   Skin: Negative.   Neurological: Negative.   Endo/Heme/Allergies: Negative.   Psychiatric/Behavioral: Negative.   14 point review of systems was performed and is negative except as detailed under history of present illness and above  PAST MEDICAL HISTORY:   Past Medical History:  Diagnosis Date  . Atrial septal defect    Surgical repair in 1994  . Chest pain    Normal coronary angiography in 1994 and 2011  . Dizziness   . Essential thrombocythemia ([place])  06/06/2016  . GERD (gastroesophageal reflux disease)   . Hyperlipidemia   . Hypertension   . LBBB (left bundle branch block) 2011  . Palpitations   . Syncope   . Ventricular tachycardia ([name])    Exercise induced    ALLERGIES: Allergies  Allergen Reactions  . Codeine Nausea And Vomiting  . Morphine     REACTION: GI symptoms      MEDICATIONS: I have reviewed the patient's current medications.    Current Outpatient Medications on File Prior to Visit  Medication Sig Dispense Refill  . aspirin 81 MG tablet Take 81 mg by mouth daily.      . Fenofibric Acid 105 MG TABS     . fish oil-omega-3 fatty acids 1000 MG capsule Take 1 capsule by mouth 2 (two) times daily.      . furosemide (LASIX) 20 MG tablet TAKE 1 TABLET (20 MG TOTAL) BY MOUTH DAILY. 90 tablet 3  . hydroxyurea (HYDREA) 500 MG capsule TAKE ONE CAPSULE TWICE A DAY . MAY TAKE WITH FOOD TO MINIMIZE GI SIDE EFFECTS 60 capsule 1  . losartan (COZAAR) 50 MG tablet TAKE 1 TABLET (50 MG TOTAL) BY MOUTH DAILY. 90 tablet 3  . metoprolol succinate (TOPROL-XL) 50 MG 24 hr tablet TAKE 1 TABLET (50 MG TOTAL) BY MOUTH DAILY. TAKE WITH OR IMMEDIATELY FOLLOWING A MEAL. 30 tablet 11  . Multiple Vitamin (MULTIVITAMIN) tablet Take 1 tablet by mouth daily.      Marland Kitchen omeprazole (PRILOSEC) 20  MG capsule Take 20 mg by mouth daily.      . pravastatin (PRAVACHOL) 80 MG tablet TAKE 1 TABLET DAILY 30 tablet 6  . fenofibrate 160 MG tablet Take 160 mg by mouth daily.      No current facility-administered medications on file prior to visit.      PAST SURGICAL HISTORY Past Surgical History:  Procedure Laterality Date  . ASD [place] EXCISIONAL BIOPSY     Right and left  . CATARACT EXTRACTION W/PHACO Left 03/11/2013   Procedure: CATARACT EXTRACTION PHACO AND INTRAOCULAR LENS PLACEMENT (IOC);  Surgeon: [name], MD;  Location: [facility];  Service: Ophthalmology;  Laterality: Left;  CDE:  12.20  . TONSILLECTOMY    .  TUBAL LIGATION     Bilateral    FAMILY HISTORY: Family History  Problem Relation Age of Onset  . Cancer Mother   . Stroke Sister   . Cancer Sister   . Heart failure Father   . Coronary artery disease Neg Hx     SOCIAL HISTORY:  reports that  has never smoked. she has never used smokeless tobacco. She reports that she does not drink alcohol or use drugs. She reports being [place]. 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  Social History   Socioeconomic History  . Marital status: Married    Spouse name: None  . Number of children: None  . Years of education: None  . Highest education level: None  Social Needs  . Financial resource strain: None  . Food insecurity - worry: None  . Food insecurity - inability: None  . Transportation needs - medical: None  . Transportation needs - non-medical: None  Occupational History  . Occupation: Psychologist, prison and probation services  Tobacco Use  . Smoking status: Never Smoker  . Smokeless tobacco: Never Used  Substance and Sexual Activity  . Alcohol use: No  . Drug use: No  . Sexual activity: No    Birth control/protection: Post-menopausal  Other Topics Concern  . None  Social History Narrative   Married and resides in [place] with husband and 3 children   Sedentary lifestyle    PERFORMANCE STATUS: The patient's performance status is 0 - Asymptomatic  PHYSICAL EXAM: Most Recent Vital Signs:   Vitals:   09/18/17 1325  BP: (!) 133/57  Pulse: 84  Resp: 16  Temp: 97.7 F (36.5 C)  SpO2: 96%    Physical Exam  Constitutional: She is oriented to person, place, and time and well-developed, well-nourished, and in no distress. No distress.  HENT:  Head: Normocephalic and atraumatic.  Mouth/Throat: No oropharyngeal exudate.  Eyes: Conjunctivae and EOM are normal. Pupils are equal, round, and reactive to light. No scleral icterus.  Neck: Normal range of motion. Neck supple. No JVD present.    Cardiovascular: Normal rate, regular rhythm and normal heart sounds. Exam reveals no gallop and no friction rub.  No murmur heard. Pulmonary/Chest: Effort normal and breath sounds normal. No respiratory distress. She has no wheezes. She has no rales.  Abdominal: Soft. Bowel sounds are normal. She exhibits no distension and no mass. There is no tenderness. There is no rebound and no guarding.  Musculoskeletal: Normal range of motion. She exhibits no edema or tenderness.  Lymphadenopathy:    She has no cervical adenopathy.  Neurological: She is alert and oriented to person, place, and time. No cranial nerve deficit. Gait normal.  Skin: Skin is warm and dry. No rash noted. No erythema. No pallor.  Psychiatric: Mood, memory, affect and judgment normal.  Nursing note and vitals reviewed.   LABORATORY DATA:  Results for orders placed or performed in visit on 09/18/17 (from the past 48 hour(s))  CBC with Differential     Status: Abnormal   Collection Time: 09/18/17 12:39 PM  Result Value Ref Range   WBC 4.4 4.0 - 10.5 K/uL   RBC 2.91 (L) 3.87 - 5.11 MIL/uL   Hemoglobin 11.8 (L) 12.0 - 15.0 g/dL   HCT 35.0 (L) 36.0 - 46.0 %   MCV 120.3 (H) 78.0 - 100.0 fL   MCH 40.5 (H) 26.0 - 34.0 pg   MCHC 33.7 30.0 - 36.0 g/dL   RDW 12.5 11.5 - 15.5 %   Platelets 278 150 - 400 K/uL   Neutrophils Relative % 44 %   Neutro Abs 1.9 1.7 - 7.7 K/uL   Lymphocytes Relative 49 %   Lymphs Abs 2.2 0.7 - 4.0 K/uL   Monocytes Relative 5 %   Monocytes Absolute 0.2 0.1 - 1.0 K/uL   Eosinophils Relative 2 %   Eosinophils Absolute 0.1 0.0 - 0.7 K/uL   Basophils Relative 1 %   Basophils Absolute 0.0 0.0 - 0.1 K/uL   RBC Morphology MACROCYTES   Comprehensive metabolic panel     Status: Abnormal   Collection Time: 09/18/17 12:39 PM  Result Value Ref Range   Sodium 138 135 - 145 mmol/L   Potassium 4.2 3.5 - 5.1 mmol/L   Chloride 104 101 - 111 mmol/L   CO2 26 22 - 32 mmol/L   Glucose, Bld 111 (H) 65 - 99 mg/dL    BUN 18 6 - 20 mg/dL   Creatinine, Ser 0.88 0.44 - 1.00 mg/dL   Calcium 9.4 8.9 - 10.3 mg/dL   Total Protein 7.2 6.5 - 8.1 g/dL   Albumin 4.5 3.5 - 5.0 g/dL   AST 26 15 - 41 U/L   ALT 22 14 - 54 U/L   Alkaline Phosphatase 63 38 - 126 U/L   Total Bilirubin 0.5 0.3 - 1.2 mg/dL   GFR calc non Af Amer >60 >60 mL/min   GFR calc Af Amer >60 >60 mL/min    Comment: (NOTE) The eGFR has been calculated using the CKD EPI equation. This calculation has not been validated in all clinical situations. eGFR's persistently <60 mL/min signify possible Chronic Kidney Disease.    Anion gap 8 5 - 15       RADIOLOGY: I have reviewed the images below and agree with the reported results  No results found.    Study Result   CLINICAL DATA:  Screening.  EXAM: 2D DIGITAL SCREENING BILATERAL MAMMOGRAM WITH CAD AND ADJUNCT TOMO  COMPARISON:  Previous exam(s).  ACR Breast Density Category b: There are scattered areas of fibroglandular density.  FINDINGS: There are no findings suspicious for malignancy. Images were processed with CAD.  IMPRESSION: No mammographic evidence of malignancy. A result letter of this screening mammogram will be mailed directly to the patient.  RECOMMENDATION: Screening mammogram in one year. (Code:SM-B-01Y)  BI-RADS CATEGORY  1: Negative.   Electronically Signed   By: Curlene Dolphin M.D.   On: 10/03/2016 13:37     ASSESSMENT/PLAN:  JAK 2 positiveEssential Thrombocytosis Macrocytosis secondary to hydrea Mild macrocytic anemia.   Reviewed labs in detail with the patient today. Platelets are 278k and hemoglobin stable at 11.8 g/dL. Patient's platelet count has  been very stable on her current dose of Hydrea. Continue Hydrea 500 mg by mouth twice a day. Goal is to maintain platelet count less than 450 K. She has a mild macrocytic anemia likely secondary to Hydrea. Continue daily aspirin for thrombosis prevention. Patient states she will have her lab work  performed at her PCPs office in 3 months, and will have a copy sent to [place] for review. I have re-iterated with her to continue this plan. She currently does not need a refill of her Hydrea. Return to clinic in 6 months for follow-up with CBC, CMP.   All questions were answered. The patient knows to call the clinic with any problems, questions or concerns. We can certainly see the patient much sooner if necessary.    This note is electronically signed [id number] Talbert Cage, MD  09/18/2017 1:38 PM

## 2017-09-18 NOTE — Progress Notes (Signed)
Taking hydrea as directed.  Stated prn nausea with medication but does not require any nausea medication.  Uses ginger ale for relief.  No problems with medication.

## 2017-09-18 NOTE — Patient Instructions (Signed)
[place] at [facility]  Discharge Instructions:  Your appointment today was with Dr. Talbert Cage _______________________________________________________________  Thank you for choosing [place] at [facility] to provide your oncology and hematology care.  To afford each patient quality time with our providers, please arrive at least 15 minutes before your scheduled appointment.  You need to re-schedule your appointment if you arrive 10 or more minutes late.  We strive to give you quality time with our providers, and arriving late affects you and other patients whose appointments are after yours.  Also, if you no show three or more times for appointments you may be dismissed from the clinic.  Again, thank you for choosing [place] [facility] [place] hope is that these requests will allow you access to exceptional care and in a timely manner. _______________________________________________________________  If you have questions after your visit, please contact our office at [phone] between the hours of 8:30 a.m. and 5:00 p.m. Voicemails left after 4:30 p.m. will not be returned until the following business day. _______________________________________________________________  For prescription refill requests, have your pharmacy contact our office. _______________________________________________________________  Recommendations made by the consultant and any test results will be sent to your referring physician. _______________________________________________________________

## 2017-09-25 ENCOUNTER — Other Ambulatory Visit (HOSPITAL_COMMUNITY): Payer: Self-pay

## 2017-09-25 DIAGNOSIS — D473 Essential (hemorrhagic) thrombocythemia: Secondary | ICD-10-CM

## 2017-09-25 MED ORDER — HYDROXYUREA 500 MG PO CAPS: ORAL_CAPSULE | ORAL | 1 refills | Status: DC

## 2017-09-25 NOTE — Telephone Encounter (Signed)
Received refill request from patients pharmacy for 90 day supply of Hydrea. Reviewed with provider, chart checked and refilled.

## 2017-09-26 ENCOUNTER — Other Ambulatory Visit (HOSPITAL_COMMUNITY): Payer: Self-pay | Admitting: Family Medicine

## 2017-09-26 DIAGNOSIS — Z1231 Encounter for screening mammogram for malignant neoplasm of breast: Secondary | ICD-10-CM

## 2017-10-08 ENCOUNTER — Ambulatory Visit (HOSPITAL_COMMUNITY): Payer: Medicare Other

## 2017-10-25 ENCOUNTER — Ambulatory Visit (HOSPITAL_COMMUNITY): Payer: Medicare Other

## 2017-11-01 ENCOUNTER — Ambulatory Visit (HOSPITAL_COMMUNITY)
Admission: RE | Admit: 2017-11-01 | Discharge: 2017-11-01 | Disposition: A | Discharge status: Home / Self Care | Payer: Medicare Other | Source: Ambulatory Visit | Attending: Family Medicine | Admitting: Family Medicine

## 2017-11-01 ENCOUNTER — Encounter (HOSPITAL_COMMUNITY): Payer: Self-pay | Admitting: Radiology

## 2017-11-01 DIAGNOSIS — Z1231 Encounter for screening mammogram for malignant neoplasm of breast: Secondary | ICD-10-CM | POA: Diagnosis present

## 2017-11-29 ENCOUNTER — Ambulatory Visit (INDEPENDENT_AMBULATORY_CARE_PROVIDER_SITE_OTHER): Payer: Medicare Other | Admitting: Cardiology

## 2017-11-29 ENCOUNTER — Encounter: Payer: Self-pay | Admitting: Cardiology

## 2017-11-29 VITALS — BP 136/76 | HR 92 | Ht 64.0 in

## 2017-11-29 DIAGNOSIS — I472 Ventricular tachycardia, unspecified: Secondary | ICD-10-CM

## 2017-11-29 DIAGNOSIS — E782 Mixed hyperlipidemia: Secondary | ICD-10-CM

## 2017-11-29 DIAGNOSIS — I10 Essential (primary) hypertension: Secondary | ICD-10-CM

## 2017-11-29 NOTE — Progress Notes (Signed)
Clinical Summary [name] is a 72 y.o.female  seen today for follow up of the following medical problems.   1. Ventricular tachycardia - remote history per notes, has had no recurrence   - no recent palpitations. No lighteadeness, no syncope  2. ASD repair - reports was done in her 32s, repaired at [facility] - echo 01/2014 shows no active ASD or shunt  - no recent SOB  3. HTN  - home bp's around 130s/70s - compliant with meds  4. Hyperlipidemia - compliant with pravastatin - 09/2016 TC 234 TG 387 HDL 39 LDL 118 - reports poor diet. Started on fenofibrate and fish oil by pcp  - HgbA1c 6.0  5. Thrombocythemia - followed by heme/onc, on hydrea.   6. Chronic LBBB   Past Medical History:  Diagnosis Date  . Atrial septal defect    Surgical repair in 1994  . Chest pain    Normal coronary angiography in 1994 and 2011  . Dizziness   . Essential thrombocythemia ([place]) 06/06/2016  . GERD (gastroesophageal reflux disease)   . Hyperlipidemia   . Hypertension   . LBBB (left bundle Heather Barber block) 2011  . Palpitations   . Syncope   . Ventricular tachycardia ([name])    Exercise induced     Allergies  Allergen Reactions  . Codeine Nausea And Vomiting  . Morphine     REACTION: GI symptoms     Current Outpatient Medications  Medication Sig Dispense Refill  . aspirin 81 MG tablet Take 81 mg by mouth daily.      . fenofibrate 160 MG tablet Take 160 mg by mouth daily.     . Fenofibric Acid 105 MG TABS     . fish oil-omega-3 fatty acids 1000 MG capsule Take 1 capsule by mouth 2 (two) times daily.      . furosemide (LASIX) 20 MG tablet TAKE 1 TABLET (20 MG TOTAL) BY MOUTH DAILY. 90 tablet 3  . hydroxyurea (HYDREA) 500 MG capsule TAKE ONE CAPSULE TWICE A DAY . MAY TAKE WITH FOOD TO MINIMIZE GI SIDE EFFECTS 180 capsule 1  . losartan (COZAAR) 50 MG tablet TAKE 1 TABLET (50 MG TOTAL) BY MOUTH DAILY. 90 tablet 3  . metoprolol succinate (TOPROL-XL) 50 MG 24 hr tablet TAKE 1  TABLET (50 MG TOTAL) BY MOUTH DAILY. TAKE WITH OR IMMEDIATELY FOLLOWING A MEAL. 30 tablet 11  . Multiple Vitamin (MULTIVITAMIN) tablet Take 1 tablet by mouth daily.      Marland Kitchen omeprazole (PRILOSEC) 20 MG capsule Take 20 mg by mouth daily.      . pravastatin (PRAVACHOL) 80 MG tablet TAKE 1 TABLET DAILY 30 tablet 6   No current facility-administered medications for this visit.      Past Surgical History:  Procedure Laterality Date  . ASD [place] EXCISIONAL BIOPSY     Right and left  . CATARACT EXTRACTION W/PHACO Left 03/11/2013   Procedure: CATARACT EXTRACTION PHACO AND INTRAOCULAR LENS PLACEMENT (IOC);  Surgeon: [name], MD;  Location: [facility];  Service: Ophthalmology;  Laterality: Left;  CDE:  12.20  . TONSILLECTOMY    . TUBAL LIGATION     Bilateral     Allergies  Allergen Reactions  . Codeine Nausea And Vomiting  . Morphine     REACTION: GI symptoms      Family History  Problem Relation Age of Onset  . Cancer Mother   . Stroke  Sister   . Cancer Sister   . Heart failure Father   . Coronary artery disease Neg Hx      Social History Ms. Massett reports that  has never smoked. she has never used smokeless tobacco. Ms. Ogan reports that she does not drink alcohol.   Review of Systems CONSTITUTIONAL: No weight loss, fever, chills, weakness or fatigue.  HEENT: Eyes: No visual loss, blurred vision, double vision or yellow sclerae.No hearing loss, sneezing, congestion, runny nose or sore throat.  SKIN: No rash or itching.  CARDIOVASCULAR: per hpi RESPIRATORY: No shortness of breath, cough or sputum.  GASTROINTESTINAL: No anorexia, nausea, vomiting or diarrhea. No abdominal pain or blood.  GENITOURINARY: No burning on urination, no polyuria NEUROLOGICAL: No headache, dizziness, syncope, paralysis, ataxia, numbness or tingling in the extremities. No change in bowel or bladder control.  MUSCULOSKELETAL: No muscle, back pain, joint  pain or stiffness.  LYMPHATICS: No enlarged nodes. No history of splenectomy.  PSYCHIATRIC: No history of depression or anxiety.  ENDOCRINOLOGIC: No reports of sweating, cold or heat intolerance. No polyuria or polydipsia.  Marland Kitchen   Physical Examination Vitals:   11/29/17 1609  BP: 136/76  Pulse: 92  SpO2: 94%   Vitals:   11/29/17 1609  Height: 5\' 4"  (1.626 m)    Gen: resting comfortably, no acute distress HEENT: no scleral icterus, pupils equal round and reactive, no palptable cervical adenopathy,  CV: RRR, 2/6 systolic murmur rusb, no jvd Resp: Clear to auscultation bilaterally GI: abdomen is soft, non-tender, non-distended, normal bowel sounds, no hepatosplenomegaly MSK: extremities are warm, no edema.  Skin: warm, no rash Neuro:  no focal deficits Psych: appropriate affect   Diagnostic Studies 01/2012 Echo Left ventricle: The cavity size was normal. Wall thickness was normal. Systolic function was mildly to moderately reduced. The estimated ejection fraction was in the range of 40% to 45%. - Ventricular septum: Septal motion showed paradox. - Aortic valve: Mildly calcified annulus. Trileaflet; mildly calcified leaflets. - Mitral valve: Mild to moderate regurgitation. - Left atrium: The atrium was mildly dilated. - Right ventricle: The cavity size was mildly dilated. Wall thickness was normal. - Right atrium: The atrium was mildly dilated. - Atrial septum: No defect or patent foramen ovale was identified. - Pulmonary arteries: PA peak pressure: 80mm Hg (S). - Pericardium, extracardiac: Minimal ascites was noted.    Assessment and Plan  1. Ventricular tachycardia - remote history, no recurrence - no recent symptoms, EKG today chronic LBBB - continue to monitor  2. HTN - at goal, continue current meds  3. Hyperlipidemia - counseled on dietary modifications for her elevated TGs - she will continue current meds  F/u 1 year    [name], M.D.

## 2017-11-29 NOTE — Patient Instructions (Signed)

## 2017-12-02 ENCOUNTER — Encounter: Payer: Self-pay | Admitting: Cardiology

## 2017-12-07 ENCOUNTER — Observation Stay (HOSPITAL_COMMUNITY)
Admission: EM | Admit: 2017-12-07 | Discharge: 2017-12-08 | Disposition: A | Discharge status: Home / Self Care | Payer: Medicare Other | Attending: Internal Medicine | Admitting: Internal Medicine

## 2017-12-07 ENCOUNTER — Emergency Department (HOSPITAL_COMMUNITY): Payer: Medicare Other

## 2017-12-07 ENCOUNTER — Encounter (HOSPITAL_COMMUNITY): Payer: Self-pay | Admitting: Emergency Medicine

## 2017-12-07 DIAGNOSIS — I10 Essential (primary) hypertension: Secondary | ICD-10-CM | POA: Diagnosis present

## 2017-12-07 DIAGNOSIS — Z7982 Long term (current) use of aspirin: Secondary | ICD-10-CM | POA: Diagnosis not present

## 2017-12-07 DIAGNOSIS — D473 Essential (hemorrhagic) thrombocythemia: Secondary | ICD-10-CM | POA: Diagnosis not present

## 2017-12-07 DIAGNOSIS — I447 Left bundle-branch block, unspecified: Secondary | ICD-10-CM | POA: Diagnosis present

## 2017-12-07 DIAGNOSIS — D75839 Thrombocytosis, unspecified: Secondary | ICD-10-CM | POA: Diagnosis present

## 2017-12-07 DIAGNOSIS — R079 Chest pain, unspecified: Secondary | ICD-10-CM | POA: Diagnosis not present

## 2017-12-07 DIAGNOSIS — Z79899 Other long term (current) drug therapy: Secondary | ICD-10-CM | POA: Diagnosis not present

## 2017-12-07 DIAGNOSIS — R1013 Epigastric pain: Secondary | ICD-10-CM | POA: Insufficient documentation

## 2017-12-07 DIAGNOSIS — K219 Gastro-esophageal reflux disease without esophagitis: Secondary | ICD-10-CM | POA: Diagnosis not present

## 2017-12-07 LAB — CBC
HCT: 36.7 % (ref 36.0–46.0)
HCT: 36.8 % (ref 36.0–46.0)
Hemoglobin: 12.8 g/dL (ref 12.0–15.0)
Hemoglobin: 12.9 g/dL (ref 12.0–15.0)
MCH: 40.4 pg — ABNORMAL HIGH (ref 26.0–34.0)
MCH: 41.7 pg — ABNORMAL HIGH (ref 26.0–34.0)
MCHC: 34.9 g/dL (ref 30.0–36.0)
MCHC: 35.1 g/dL (ref 30.0–36.0)
MCV: 115.8 fL — ABNORMAL HIGH (ref 78.0–100.0)
MCV: 119.1 fL — ABNORMAL HIGH (ref 78.0–100.0)
Platelets: 286 10*3/uL (ref 150–400)
Platelets: 263 10*3/uL (ref 150–400)
RBC: 3.17 MIL/uL — ABNORMAL LOW (ref 3.87–5.11)
RBC: 3.09 MIL/uL — ABNORMAL LOW (ref 3.87–5.11)
RDW: 12.3 % (ref 11.5–15.5)
RDW: 12.8 % (ref 11.5–15.5)
WBC: 6.3 10*3/uL (ref 4.0–10.5)
WBC: 5.9 10*3/uL (ref 4.0–10.5)

## 2017-12-07 LAB — BASIC METABOLIC PANEL
Anion gap: 16 — ABNORMAL HIGH (ref 5–15)
BUN: 17 mg/dL (ref 6–20)
CO2: 20 mmol/L — ABNORMAL LOW (ref 22–32)
Calcium: 9.7 mg/dL (ref 8.9–10.3)
Chloride: 104 mmol/L (ref 101–111)
Creatinine, Ser: 0.85 mg/dL (ref 0.44–1.00)
GFR calc Af Amer: >60 mL/min (ref >60)
GFR calc non Af Amer: >60 mL/min (ref >60)
Glucose, Bld: 115 mg/dL — ABNORMAL HIGH (ref 65–99)
Potassium: 3.6 mmol/L (ref 3.5–5.1)
Sodium: 140 mmol/L (ref 135–145)

## 2017-12-07 LAB — TROPONIN I: Troponin I: <0.03 ng/mL (ref <0.03)

## 2017-12-07 LAB — LIPASE, BLOOD: Lipase: 33 U/L (ref 11–51)

## 2017-12-07 LAB — HEPATIC FUNCTION PANEL
ALT: 27 U/L (ref 14–54)
AST: 28 U/L (ref 15–41)
Albumin: 4.1 g/dL (ref 3.5–5.0)
Alkaline Phosphatase: 80 U/L (ref 38–126)
Bilirubin, Direct: <0.1 mg/dL — ABNORMAL LOW (ref 0.1–0.5)
Total Bilirubin: 0.5 mg/dL (ref 0.3–1.2)
Total Protein: 7 g/dL (ref 6.5–8.1)

## 2017-12-07 LAB — CREATININE, SERUM
Creatinine, Ser: 0.87 mg/dL (ref 0.44–1.00)
GFR calc Af Amer: >60 mL/min (ref >60)
GFR calc non Af Amer: >60 mL/min (ref >60)

## 2017-12-07 LAB — I-STAT TROPONIN, ED
Troponin i, poc: 0 ng/mL (ref 0.00–0.08)
Troponin i, poc: 0.01 ng/mL (ref 0.00–0.08)

## 2017-12-07 MED ORDER — ACETAMINOPHEN 325 MG PO TABS
650.0000 mg | ORAL_TABLET | ORAL | Status: DC | PRN
  Administered 2017-12-08: 650 mg via ORAL
  Filled 2017-12-07: qty 2

## 2017-12-07 MED ORDER — HYDROXYUREA 500 MG PO CAPS
500.0000 mg | ORAL_CAPSULE | Freq: Two times a day (BID) | ORAL | Status: DC
  Administered 2017-12-08 (×2): 500 mg via ORAL
  Filled 2017-12-07 (×2): qty 1

## 2017-12-07 MED ORDER — MORPHINE SULFATE (PF) 4 MG/ML IV SOLN
4.0000 mg | Freq: Once | INTRAVENOUS | Status: AC
  Administered 2017-12-07: 4 mg via INTRAVENOUS
  Filled 2017-12-07: qty 1

## 2017-12-07 MED ORDER — IOPAMIDOL (ISOVUE-300) INJECTION 61%
INTRAVENOUS | Status: AC
  Administered 2017-12-07: 100 mL
  Filled 2017-12-07: qty 100

## 2017-12-07 MED ORDER — ASPIRIN EC 81 MG PO TBEC
81.0000 mg | DELAYED_RELEASE_TABLET | Freq: Every day | ORAL | Status: DC
  Administered 2017-12-08: 81 mg via ORAL
  Filled 2017-12-07: qty 1

## 2017-12-07 MED ORDER — ENOXAPARIN SODIUM 40 MG/0.4ML ~~LOC~~ SOLN
40.0000 mg | SUBCUTANEOUS | Status: DC
  Administered 2017-12-08: 40 mg via SUBCUTANEOUS
  Filled 2017-12-07: qty 0.4

## 2017-12-07 MED ORDER — ONDANSETRON HCL 4 MG/2ML IJ SOLN
4.0000 mg | Freq: Once | INTRAMUSCULAR | Status: AC
Start: 2017-12-07 — End: 2017-12-07
  Administered 2017-12-07: 4 mg via INTRAVENOUS
  Filled 2017-12-07: qty 2

## 2017-12-07 MED ORDER — METOPROLOL SUCCINATE ER 50 MG PO TB24
50.0000 mg | ORAL_TABLET | Freq: Every day | ORAL | Status: DC
  Administered 2017-12-08: 50 mg via ORAL
  Filled 2017-12-07: qty 1

## 2017-12-07 MED ORDER — PANTOPRAZOLE SODIUM 40 MG PO TBEC
40.0000 mg | DELAYED_RELEASE_TABLET | Freq: Every day | ORAL | Status: DC
  Administered 2017-12-08: 40 mg via ORAL
  Filled 2017-12-07: qty 1

## 2017-12-07 MED ORDER — PANTOPRAZOLE SODIUM 40 MG IV SOLR
40.0000 mg | Freq: Once | INTRAVENOUS | Status: AC
  Administered 2017-12-07: 40 mg via INTRAVENOUS
  Filled 2017-12-07: qty 40

## 2017-12-07 MED ORDER — ONDANSETRON HCL 4 MG/2ML IJ SOLN: 4.0000 mg | Freq: Four times a day (QID) | INTRAMUSCULAR | Status: DC | PRN

## 2017-12-07 MED ORDER — FUROSEMIDE 20 MG PO TABS
20.0000 mg | ORAL_TABLET | Freq: Every day | ORAL | Status: DC
  Administered 2017-12-08: 20 mg via ORAL
  Filled 2017-12-07: qty 1

## 2017-12-07 MED ORDER — NITROGLYCERIN 2 % TD OINT
1.0000 [in_us] | TOPICAL_OINTMENT | Freq: Once | TRANSDERMAL | Status: AC
  Administered 2017-12-07: 1 [in_us] via TOPICAL
  Filled 2017-12-07: qty 1

## 2017-12-07 MED ORDER — LOSARTAN POTASSIUM 50 MG PO TABS
50.0000 mg | ORAL_TABLET | Freq: Every day | ORAL | Status: DC
  Administered 2017-12-08: 50 mg via ORAL
  Filled 2017-12-07 (×2): qty 1

## 2017-12-07 NOTE — ED Triage Notes (Signed)
EMS reports the pt was at her PCP's office and began having chest pain. EMS reports the [place] office caled 9-1-1 for an emergency transfer due to a STEMI. EMS reports the pt does not have a history of a LBBB and states that is what she is showing on the monitor. EMS states the pain started at 1500 hrs this date. EMS reports 324 of ASA and (1) 0.4 SL nitro with no relief. EMS reports 17mcg of Fentanyl that reduced her pain from a 10 down to an 8. EMS started an 18g IV.  Pt reports chest pain over the left breast and epigastric pain. Pt denies radiation of pain, nausea, or vomiting. Pt does report some SOB.

## 2017-12-07 NOTE — H&P (Signed)
History and Physical    Heather Barber TLX:[id number] DOB: Mar 20, 1946 DOA: 12/07/2017  PCP: Dione Housekeeper, MD  Patient coming from: home   Chief Complaint: chest pain  HPI: Heather Barber is a 72 y.o. female with medical history significant for hx v-tach, LBB, htn, thrombocytosis, asd (repaired), who presents with acute-onset chest pain.  No history of this sort of chest pain, no history of MI.  Two recent stressors: death of sister 2 wks ago, and working more than usual in [employer].  Also fell a couple of weeks ago and thinks injured chest, but that pain had resolved.  Today while resting developed acute-onset substernal chest pain, also pain below both ribs bilterally, worse with movement. Not with exertion. Did not radiate. Does not feel like her typical gerd symptoms. No cough. No hemoptysis. No vomiting or diarrhea. No recent sick contacts. Denies drug use. Not a smoker.   ED Course: received nitro and aspirin and fentanyl via ems, pain improved to 5/10. Received morphine and nitro again and pain resolved. Labs performed.   Review of Systems: As per HPI otherwise 10 point review of systems negative.    Past Medical History:  Diagnosis Date  . Atrial septal defect    Surgical repair in 1994  . Chest pain    Normal coronary angiography in 1994 and 2011  . Dizziness   . Essential thrombocythemia ([place]) 06/06/2016  . GERD (gastroesophageal reflux disease)   . Hyperlipidemia   . Hypertension   . LBBB (left bundle branch block) 2011  . Palpitations   . Syncope   . Ventricular tachycardia ([place])    Exercise induced    Past Surgical History:  Procedure Laterality Date  . ASD [place] EXCISIONAL BIOPSY     Right and left  . CATARACT EXTRACTION W/PHACO Left 03/11/2013   Procedure: CATARACT EXTRACTION PHACO AND INTRAOCULAR LENS PLACEMENT (IOC);  Surgeon: [name], MD;  Location: [facility];  Service: Ophthalmology;   Laterality: Left;  CDE:  12.20  . TONSILLECTOMY    . TUBAL LIGATION     Bilateral     reports that  has never smoked. she has never used smokeless tobacco. She reports that she does not drink alcohol or use drugs.  Allergies  Allergen Reactions  . Codeine Nausea And Vomiting  . Morphine Other (See Comments)    GI symptoms    Family History  Problem Relation Age of Onset  . Cancer Mother   . Stroke Sister   . Cancer Sister   . Heart failure Father   . Coronary artery disease Neg Hx     Prior to Admission medications   Medication Sig Start Date End Date Taking? Authorizing Provider  aspirin EC 81 MG tablet Take 81 mg by mouth daily.   Yes [provider]  Fenofibric Acid 105 MG TABS Take 105 mg by mouth at bedtime.  08/27/16  Yes [provider]  furosemide (LASIX) 20 MG tablet TAKE 1 TABLET (20 MG TOTAL) BY MOUTH DAILY. 07/16/17  Yes Branch, Alphonse Guild, MD  hydroxyurea (HYDREA) 500 MG capsule TAKE ONE CAPSULE TWICE A DAY . MAY TAKE WITH FOOD TO MINIMIZE GI SIDE EFFECTS Patient taking differently: Take 500 mg by mouth 2 (two) times daily. MAY TAKE WITH FOOD TO MINIMIZE GI SIDE EFFECTS 09/25/17  Yes Twana First, MD  loratadine (CLARITIN) 10 MG tablet Take 10 mg by mouth daily as needed  for allergies.   Yes [provider]  losartan (COZAAR) 50 MG tablet TAKE 1 TABLET (50 MG TOTAL) BY MOUTH DAILY. 08/24/17  Yes Branch, Alphonse Guild, MD  metoprolol succinate (TOPROL-XL) 50 MG 24 hr tablet TAKE 1 TABLET (50 MG TOTAL) BY MOUTH DAILY. TAKE WITH OR IMMEDIATELY FOLLOWING A MEAL. 07/26/17  Yes BranchAlphonse Guild, MD  naproxen sodium (ALEVE) 220 MG tablet Take 440 mg by mouth 2 (two) times daily as needed (pain/headache).   Yes [provider]  Omega-3 Fatty Acids (FISH OIL) 1200 MG CAPS Take 1,200 mg by mouth daily.   Yes [provider]  omeprazole (PRILOSEC) 20 MG capsule Take 20 mg by mouth daily.     Yes [provider]  pravastatin  (PRAVACHOL) 80 MG tablet TAKE 1 TABLET DAILY Patient taking differently: TAKE 1 TABLET (80 MG) BY MOUTH  DAILY AT BEDTIME 08/09/15  Yes [name], MD    Physical Exam: Vitals:   12/07/17 1900 12/07/17 1930 12/07/17 2000 12/07/17 2030  BP: 140/82 (!) 149/77  135/66  Pulse: 77 73 73 72  Resp: 12 (!) 9 16   Temp:      TempSrc:      SpO2: 95% 95% 93% 96%  Weight:      Height:        Constitutional: No acute distress Head: Atraumatic Eyes: Conjunctiva clear ENM: Moist mucous membranes. Normal dentition.  Neck: Supple Respiratory: Clear to auscultation bilaterally, no wheezing/rales/rhonchi. Normal respiratory effort. No accessory muscle use. . Cardiovascular: moderate non-harsh systolic murmur, no rubs or extra heart sounds. No chest wall tenderness Abdomen: Non-tender, non-distended. No masses. No rebound or guarding. Positive bowel sounds. Musculoskeletal: No joint deformity upper and lower extremities. Normal ROM, no contractures. Normal muscle tone.  Skin: No rashes, lesions, or ulcers.  Extremities: No peripheral edema. Palpable peripheral pulses. Neurologic: Alert, moving all 4 extremities. Psychiatric: Normal insight and judgement.   Labs on Admission: I have personally reviewed following labs and imaging studies  CBC: Recent Labs  Lab 12/07/17 1600  WBC 6.3  HGB 12.8  HCT 36.7  MCV 115.8*  PLT 662   Basic Metabolic Panel: Recent Labs  Lab 12/07/17 1600  NA 140  K 3.6  CL 104  CO2 20*  GLUCOSE 115*  BUN 17  CREATININE 0.85  CALCIUM 9.7   GFR: Estimated Creatinine Clearance: 62.4 mL/min (by C-G formula based on SCr of 0.85 mg/dL). Liver Function Tests: Recent Labs  Lab 12/07/17 1808  AST 28  ALT 27  ALKPHOS 80  BILITOT 0.5  PROT 7.0  ALBUMIN 4.1   Recent Labs  Lab 12/07/17 1808  LIPASE 33   No results for input(s): AMMONIA in the last 168 hours. Coagulation Profile: No results for input(s): INR, PROTIME in the last 168  hours. Cardiac Enzymes: No results for input(s): CKTOTAL, CKMB, CKMBINDEX, TROPONINI in the last 168 hours. BNP (last 3 results) No results for input(s): PROBNP in the last 8760 hours. HbA1C: No results for input(s): HGBA1C in the last 72 hours. CBG: No results for input(s): GLUCAP in the last 168 hours. Lipid Profile: No results for input(s): CHOL, HDL, LDLCALC, TRIG, CHOLHDL, LDLDIRECT in the last 72 hours. Thyroid Function Tests: No results for input(s): TSH, T4TOTAL, FREET4, T3FREE, THYROIDAB in the last 72 hours. Anemia Panel: No results for input(s): VITAMINB12, FOLATE, FERRITIN, TIBC, IRON, RETICCTPCT in the last 72 hours. Urine analysis: No results found for: COLORURINE, APPEARANCEUR, [place], [place], [place], [place], [place], [place], [place],  UROBILINOGEN, NITRITE, LEUKOCYTESUR  Radiological Exams on Admission: Dg Chest 2 View  Result Date: 12/07/2017 CLINICAL DATA:  Chest pain. EXAM: CHEST  2 VIEW COMPARISON:  February 08, 2012 FINDINGS: Stable cardiomegaly. The hila and mediastinum are normal. No pulmonary nodules, masses, or focal infiltrates. IMPRESSION: No active cardiopulmonary disease. Electronically Signed   By: Dorise Bullion III M.D   On: 12/07/2017 16:47   Ct Abdomen Pelvis W Contrast  Result Date: 12/07/2017 CLINICAL DATA:  Epigastric region pain EXAM: CT ABDOMEN AND PELVIS WITH CONTRAST TECHNIQUE: Multidetector CT imaging of the abdomen and pelvis was performed using the standard protocol following bolus administration of intravenous contrast. CONTRAST:  100 mL ISOVUE-300 IOPAMIDOL (ISOVUE-300) INJECTION 61% COMPARISON:  None. FINDINGS: Lower chest: There is mild bibasilar fibrotic change. There is no lung base edema or consolidation. There are foci of coronary artery calcification. Hepatobiliary: Liver measures 20.1 cm in length. There is hepatic steatosis. No focal liver lesions are apparent. Gallbladder wall is not appreciably thickened. There is no biliary  duct dilatation. Pancreas: There is no pancreatic mass or inflammatory focus. Spleen: No splenic lesions are evident. Spleen is upper limits normal in size. Adrenals/Urinary Tract: Adrenals bilaterally appear normal. There is a cyst arising from the anterior mid right kidney measuring 2.4 x 2.4 cm. There is a cyst arising from the lateral left kidney measuring 1.6 x 1.6 cm. There is no hydronephrosis on either side. There is no renal or ureteral calculus on either side. Urinary bladder is midline with wall thickness within normal limits. Stomach/Bowel: There is no appreciable bowel wall or mesenteric thickening. No evident bowel obstruction. No free air or portal venous air. Vascular/Lymphatic: There is atherosclerotic calcification in the aorta and common iliac arteries. No aneurysm evident. There is no appreciable adenopathy in the abdomen or pelvis. Reproductive: Uterus is anteverted.  No evident pelvic mass. Other: No periappendiceal region inflammation is evident. Appendix is not convincingly seen. There is a small ventral hernia containing only fat. There is no abscess or ascites in the abdomen or pelvis. Musculoskeletal: There is vacuum phenomenon at L4-5. Degenerative changes noted at several levels in the lumbar spine. There are no blastic or lytic bone lesions. There is no intramuscular or abdominal wall lesion evident. IMPRESSION: 1. Prominent liver with hepatic steatosis. Spleen upper normal in size without focal splenic lesion. 2. No appreciable bowel wall thickening or bowel obstruction. No abscess. No periappendiceal region inflammation. 3.  No renal or ureteral calculus.  No hydronephrosis. 4. Aortoiliac atherosclerosis. There are foci of coronary artery calcification. 5.  Small ventral hernia containing only fat. Aortic Atherosclerosis (ICD10-I70.0). Electronically Signed   By: Lowella Grip III M.D.   On: 12/07/2017 18:55    EKG: Independently reviewed. Stable LBB, first degree heart block,  no ischemic changes.  Assessment/Plan Active Problems:   LBBB (left bundle branch block)   Hypertension   Thrombocytosis ([name])   Gastroesophageal reflux disease   Chest pain   # chest pain - no sig cardiac history but does have hx lbb and is moderate risk (age, htn, thrombocytosis). Heart score 3. Hemodynamically stable, chest pain improved. Recent fall thus could represent msk etiology; also sig recent stressors. No signs dvt, normal breathing, normal o2, unremarkable chest x ray - do not think pe or other pulmonary process. No signs pericarditis on ecg. No parasternal tenderness to suggest costochondritis. troponins neg x2, ecg non-ischemic. On ED provider's exam abdominal tenderness was present - ct abd/pelvis neg; normal cmp and lipase, and on  my exam abdomen soft, non-tender, so low suspicion for abdominal etiology.  - admit for obs - npo after midnight for possible stress in AM - ecg in AM - tele - trend troponin - am lipids and a1c for risk stratification  # HTN - here bp mildly elevated - continue home meds ( losartan, metoprolol, lasix, aspirin); home hold statin and fibrate - monitor  # thrombocytosis - here platelets wnl - continue home hydroxyurea   DVT prophylaxis: lovenox, scds Code Status: full  Family Communication: daughter brenda Tamala Julian [phone]; husband russel [phone]  Disposition Plan: likely home  Consults called: none Admission status: tele  Desma Maxim MD [facility] Pager [phone]  If 7PM-7AM, please contact night-coverage [email or web address] Password [email or web address]  12/07/2017, 9:00 PM

## 2017-12-07 NOTE — ED Notes (Signed)
Attempted to call report; per secretary would need to check with charge nurse to verify is okay to move due to no nurse being assigned yet. States will call back in 10-15 minutes

## 2017-12-07 NOTE — ED Provider Notes (Signed)
Medical screening examination/treatment/procedure(s) were conducted as a shared visit with non-physician practitioner(s) and myself.  I personally evaluated the patient during the encounter.   EKG Interpretation  Date/Time:  Friday December 07 2017 16:04:14 EST Ventricular Rate:  85 PR Interval:    QRS Duration: 144 QT Interval:  423 QTC Calculation: 503 R Axis:   -14 Text Interpretation:  Sinus rhythm Prolonged PR interval Left bundle branch block old LBBB. No significant change since last tracing Confirmed by Isla Pence [id number]) on 12/08/2017 2:55:00 PM     Patient presented with central lower chest pain referred from her PCP office.  Pain is been present for 2 days.  EKG at PCP office was concerning for left bundle branch block.  As I evaluate the patient, she describes pain epigastric lower chest and bilateral upper abdomen\lower chest.  Patient has been treated for pain with morphine.  She is drowsy but appropriate.  No respiratory distress.  Lungs clear without wheeze rhonchi rales.  Heart regular no gross rub or gallop.  Abdomen is tender in the epigastrium and right upper quadrant.  No guarding.  Lower abdomen nontender.  No peripheral edema or calf tenderness.  EKG review shows old left bundle branch block.  There is significant abdominal component as well as chest pain.  Proceed with adding evaluation for upper abdominal pain.  At this point first troponin negative.  I agree with plan and management.   Charlesetta Shanks, MD 12/08/17 [phone]

## 2017-12-07 NOTE — ED Provider Notes (Signed)
[facility] [facility]E PROGRESSIVE CARE Provider Note   CSN: [id number] Arrival date & time: 12/07/17  1553     History   Chief Complaint Chief Complaint  Patient presents with  . Chest Pain    HPI  Heather Barber is a 72 y.o. Female history of left bundle branch block, hypertension, hyperlipidemia, ASD status post surgical repair, essential thrombocythemia, who presents to the ED via EMS from her primary care office for evaluation of severe substernal chest pain.  Patient reports that she has been having mild pain for the past 2 days, but at 3:00 today while she was at her PCPs office pain became significantly worse, rated 10 out of 10, described as constant chest pressure.  Pain does not radiate anywhere.  No aggravating factors, patient received 324 of aspirin, 1 sublingual without relief, was given 25 mcg of fentanyl to reduce her pain from a 10 to an 8. Patient describes some associated diaphoresis, nausea, no vomiting, and intermittent shortness of breath.  EKG at doctor's office showed left bundle branch block, although on review of the chart this does not appear to be a new finding.  Currently patient continues to report 5 out of 10 substernal chest pain.  She denies history of previous MI, heart caths in 1994 in 2011. Last echo in 2015 showed EF of 88%, 1 diastolic dysfunction and mild mitral regurg.  Patient is followed by Dr. Harl Bowie with cardiology.  There is a noted remote history of ventricular tachycardia, no recent episodes per cardiology.       Past Medical History:  Diagnosis Date  . Atrial septal defect    Surgical repair in 1994  . Chest pain    Normal coronary angiography in 1994 and 2011  . Dizziness   . Essential thrombocythemia ([place]) 06/06/2016  . GERD (gastroesophageal reflux disease)   . Hyperlipidemia   . Hypertension   . LBBB (left bundle branch block) 2011  . Palpitations   . Syncope   . Ventricular tachycardia [facility])    Exercise induced    Patient Active  Problem List   Diagnosis Date Noted  . Chest pain 12/07/2017  . Gastroesophageal reflux disease 07/24/2016  . Thrombocytosis ([place]) 06/06/2016  . Breast thickening right , with negative mammogram. 09/27/2015  . Well woman exam with routine gynecological exam 08/23/2015  . LBBB (left bundle branch block)   . Ventricular tachycardia ([place])   . ASD (atrial septal defect)   . Hypertension   . Hyperlipidemia 04/27/2010    Past Surgical History:  Procedure Laterality Date  . ASD [place] EXCISIONAL BIOPSY     Right and left  . CATARACT EXTRACTION W/PHACO Left 03/11/2013   Procedure: CATARACT EXTRACTION PHACO AND INTRAOCULAR LENS PLACEMENT (IOC);  Surgeon: [name], MD;  Location: [facility];  Service: Ophthalmology;  Laterality: Left;  CDE:  12.20  . TONSILLECTOMY    . TUBAL LIGATION     Bilateral    OB History    No data available       Home Medications    Prior to Admission medications   Medication Sig Start Date End Date Taking? Authorizing Provider  aspirin EC 81 MG tablet Take 81 mg by mouth daily.   Yes [provider]  Fenofibric Acid 105 MG TABS Take 105 mg by mouth at bedtime.  08/27/16  Yes [provider]  furosemide (LASIX) 20 MG tablet TAKE 1 TABLET (20 MG  TOTAL) BY MOUTH DAILY. 07/16/17  Yes Branch, Alphonse Guild, MD  hydroxyurea (HYDREA) 500 MG capsule TAKE ONE CAPSULE TWICE A DAY . MAY TAKE WITH FOOD TO MINIMIZE GI SIDE EFFECTS Patient taking differently: Take 500 mg by mouth 2 (two) times daily. MAY TAKE WITH FOOD TO MINIMIZE GI SIDE EFFECTS 09/25/17  Yes Twana First, MD  loratadine (CLARITIN) 10 MG tablet Take 10 mg by mouth daily as needed for allergies.   Yes [provider]  losartan (COZAAR) 50 MG tablet TAKE 1 TABLET (50 MG TOTAL) BY MOUTH DAILY. 08/24/17  Yes Branch, Alphonse Guild, MD  metoprolol succinate (TOPROL-XL) 50 MG 24 hr tablet TAKE 1 TABLET (50 MG TOTAL) BY MOUTH DAILY. TAKE WITH OR  IMMEDIATELY FOLLOWING A MEAL. 07/26/17  Yes BranchAlphonse Guild, MD  naproxen sodium (ALEVE) 220 MG tablet Take 440 mg by mouth 2 (two) times daily as needed (pain/headache).   Yes [provider]  Omega-3 Fatty Acids (FISH OIL) 1200 MG CAPS Take 1,200 mg by mouth daily.   Yes [provider]  omeprazole (PRILOSEC) 20 MG capsule Take 20 mg by mouth daily.     Yes [provider]  pravastatin (PRAVACHOL) 80 MG tablet TAKE 1 TABLET DAILY Patient taking differently: TAKE 1 TABLET (80 MG) BY MOUTH  DAILY AT BEDTIME 08/09/15  Yes Branch, Alphonse Guild, MD    Family History Family History  Problem Relation Age of Onset  . Cancer Mother   . Stroke Sister   . Cancer Sister   . Heart failure Father   . Coronary artery disease Neg Hx     Social History Social History   Tobacco Use  . Smoking status: Never Smoker  . Smokeless tobacco: Never Used  Substance Use Topics  . Alcohol use: No  . Drug use: No     Allergies   Codeine and Morphine   Review of Systems Review of Systems   Physical Exam Updated Vital Signs BP 133/65 (BP Location: Right Arm)   Pulse 68   Temp 97.6 F (36.4 C) (Oral)   Resp 16   Ht 5\' 4"  (1.626 m)   Wt 77.5 kg (170 lb 14.4 oz)   SpO2 96%   BMI 29.33 kg/m   Physical Exam  Constitutional: She appears well-developed and well-nourished.  Non-toxic appearance. No distress.  Patient is nontoxic-appearing although does appear uncomfortable, in no acute distress  HENT:  Head: Normocephalic and atraumatic.  Eyes: EOM are normal. Pupils are equal, round, and reactive to light. Right eye exhibits no discharge. Left eye exhibits no discharge.  Neck: Normal range of motion. Neck supple.  Cardiovascular: Normal rate, regular rhythm, normal heart sounds and intact distal pulses.  Pulses:      Radial pulses are 2+ on the right side, and 2+ on the left side.       Dorsalis pedis pulses are 2+ on the right side, and 2+ on the left side.    Pulmonary/Chest: Effort normal. No respiratory distress.  Respirations equal and unlabored, patient able to speak in full sentences, lungs clear to auscultation bilaterally with good air movement  Abdominal: Soft. Bowel sounds are normal.  Mild epigastric tenderness without guarding, all other quadrants nontender to palpation without peritoneal signs  Musculoskeletal: She exhibits no edema or deformity.  No lower extremity edema noted  Neurological: She is alert. Coordination normal.  Speech is clear, able to follow commands CN III-XII intact Normal strength in upper and lower extremities bilaterally including dorsiflexion  and plantar flexion, strong and equal grip strength Sensation normal to light and sharp touch Moves extremities without ataxia, coordination intact  Skin: Skin is warm and dry. Capillary refill takes less than 2 seconds. She is not diaphoretic.  Psychiatric: She has a normal mood and affect. Her behavior is normal.  Nursing note and vitals reviewed.    ED Treatments / Results  Labs (all labs ordered are listed, but only abnormal results are displayed) Labs Reviewed  BASIC METABOLIC PANEL - Abnormal; Notable for the following components:      Result Value   CO2 20 (*)    Glucose, Bld 115 (*)    Anion gap 16 (*)    All other components within normal limits  CBC - Abnormal; Notable for the following components:   RBC 3.17 (*)    MCV 115.8 (*)    MCH 40.4 (*)    All other components within normal limits  HEPATIC FUNCTION PANEL - Abnormal; Notable for the following components:   Bilirubin, Direct <0.1 (*)    All other components within normal limits  CBC - Abnormal; Notable for the following components:   RBC 3.09 (*)    MCV 119.1 (*)    MCH 41.7 (*)    All other components within normal limits  LIPASE, BLOOD  TROPONIN I  CREATININE, SERUM  URINALYSIS, ROUTINE W REFLEX MICROSCOPIC  TROPONIN I  TROPONIN I  HEMOGLOBIN A1C  LIPID PANEL  I-STAT TROPONIN,  ED  I-STAT TROPONIN, ED    EKG  EKG Interpretation None       Radiology Dg Chest 2 View  Result Date: 12/07/2017 CLINICAL DATA:  Chest pain. EXAM: CHEST  2 VIEW COMPARISON:  February 08, 2012 FINDINGS: Stable cardiomegaly. The hila and mediastinum are normal. No pulmonary nodules, masses, or focal infiltrates. IMPRESSION: No active cardiopulmonary disease. Electronically Signed   By: Dorise Bullion III M.D   On: 12/07/2017 16:47   Ct Abdomen Pelvis W Contrast  Result Date: 12/07/2017 CLINICAL DATA:  Epigastric region pain EXAM: CT ABDOMEN AND PELVIS WITH CONTRAST TECHNIQUE: Multidetector CT imaging of the abdomen and pelvis was performed using the standard protocol following bolus administration of intravenous contrast. CONTRAST:  100 mL ISOVUE-300 IOPAMIDOL (ISOVUE-300) INJECTION 61% COMPARISON:  None. FINDINGS: Lower chest: There is mild bibasilar fibrotic change. There is no lung base edema or consolidation. There are foci of coronary artery calcification. Hepatobiliary: Liver measures 20.1 cm in length. There is hepatic steatosis. No focal liver lesions are apparent. Gallbladder wall is not appreciably thickened. There is no biliary duct dilatation. Pancreas: There is no pancreatic mass or inflammatory focus. Spleen: No splenic lesions are evident. Spleen is upper limits normal in size. Adrenals/Urinary Tract: Adrenals bilaterally appear normal. There is a cyst arising from the anterior mid right kidney measuring 2.4 x 2.4 cm. There is a cyst arising from the lateral left kidney measuring 1.6 x 1.6 cm. There is no hydronephrosis on either side. There is no renal or ureteral calculus on either side. Urinary bladder is midline with wall thickness within normal limits. Stomach/Bowel: There is no appreciable bowel wall or mesenteric thickening. No evident bowel obstruction. No free air or portal venous air. Vascular/Lymphatic: There is atherosclerotic calcification in the aorta and common iliac  arteries. No aneurysm evident. There is no appreciable adenopathy in the abdomen or pelvis. Reproductive: Uterus is anteverted.  No evident pelvic mass. Other: No periappendiceal region inflammation is evident. Appendix is not convincingly seen. There is a small  ventral hernia containing only fat. There is no abscess or ascites in the abdomen or pelvis. Musculoskeletal: There is vacuum phenomenon at L4-5. Degenerative changes noted at several levels in the lumbar spine. There are no blastic or lytic bone lesions. There is no intramuscular or abdominal wall lesion evident. IMPRESSION: 1. Prominent liver with hepatic steatosis. Spleen upper normal in size without focal splenic lesion. 2. No appreciable bowel wall thickening or bowel obstruction. No abscess. No periappendiceal region inflammation. 3.  No renal or ureteral calculus.  No hydronephrosis. 4. Aortoiliac atherosclerosis. There are foci of coronary artery calcification. 5.  Small ventral hernia containing only fat. Aortic Atherosclerosis (ICD10-I70.0). Electronically Signed   By: Lowella Grip III M.D.   On: 12/07/2017 18:55    Procedures Procedures (including critical care time)  Medications Ordered in ED Medications  aspirin EC tablet 81 mg (not administered)  pantoprazole (PROTONIX) EC tablet 40 mg (not administered)  metoprolol succinate (TOPROL-XL) 24 hr tablet 50 mg (not administered)  losartan (COZAAR) tablet 50 mg (not administered)  hydroxyurea (HYDREA) capsule 500 mg (not administered)  furosemide (LASIX) tablet 20 mg (not administered)  acetaminophen (TYLENOL) tablet 650 mg (not administered)  ondansetron (ZOFRAN) injection 4 mg (not administered)  enoxaparin (LOVENOX) injection 40 mg (not administered)  nitroGLYCERIN (NITROGLYN) 2 % ointment 1 inch (1 inch Topical Given 12/07/17 1719)  ondansetron (ZOFRAN) injection 4 mg (4 mg Intravenous Given 12/07/17 1713)  morphine 4 MG/ML injection 4 mg (4 mg Intravenous Given 12/07/17 1712)   iopamidol (ISOVUE-300) 61 % injection (100 mLs  Contrast Given 12/07/17 1826)  pantoprazole (PROTONIX) injection 40 mg (40 mg Intravenous Given 12/07/17 1930)  ondansetron (ZOFRAN) injection 4 mg (4 mg Intravenous Given 12/07/17 1941)     Initial Impression / Assessment and Plan / ED Course  I have reviewed the triage vital signs and the nursing notes.  Pertinent labs & imaging results that were available during my care of the patient were reviewed by me and considered in my medical decision making (see chart for details).  Patient presents for evaluation of substernal, nonradiating chest pain, intermittently present for the past 2 days, but acutely worsened at 3 PM while at her primary care office.  Received 325 of aspirin, 1 sublingual nitro and fentanyl with slight improvement in pain.  Continuing to have chest pain on initial evaluate.  Vitals are stable, patient appears uncomfortable but is in no acute distress.  No previous history of ACS  Initial labs are reassuring, troponin negative and EKG does not show acute changes when compared to previous, LBBB present in 2011.  No leukocytosis, hemoglobin stable, no electrolyte derangements requiring intervention, kidney function normal.  5:44 PM on reevaluation after morphine and Nitropaste applied, patient reports chest pain is now 1 out of 10.  Discussed lab results, EKG and chest x-ray with patient and family.  6:12 PM patient discussed with Dr. Vallery Ridge who saw and evaluated the patient as well, patient is now complaining of some upper abdominal pain, and on Dr. Charlott Rakes exam was tender to palpation, will add on hepatic function panel as well as lipase and urinalysis, and get CT scan of the abdomen to rule out causing pain especially given severe pain but reassuring cardiac workup overall.  She has complained of some nausea, no episodes of emesis, denies diarrhea or blood in the stools.  Abdominal CT and abdominal labs are unremarkable, no acute  cause of abdominal pain found at this time.  On reevaluation patient continues to report  some nausea, but denies any pain at this time in the chest or abdomen.  IV Protonix and an additional dose of Zofran.  Feel patient would benefit from admission for further chest pain rule out, initial delta troponin is pending at this time. Will consult for medicine admission.  Patient is followed by Dr. Harl Bowie with cardiology, but cardiology was not acutely otherwise consulted here in the ED as we were able to get patient pain-free.  Spoke with Dr.Wouk, with [facility] who will see and admit the patient.   Final Clinical Impressions(s) / ED Diagnoses   Final diagnoses:  Chest pain, unspecified type  Epigastric pain    ED Discharge Orders    None       Janet Berlin 12/08/17 Piedad Climes, MD 12/08/17 [phone]

## 2017-12-08 ENCOUNTER — Observation Stay (HOSPITAL_BASED_OUTPATIENT_CLINIC_OR_DEPARTMENT_OTHER): Payer: Medicare Other

## 2017-12-08 ENCOUNTER — Other Ambulatory Visit: Payer: Self-pay

## 2017-12-08 DIAGNOSIS — D473 Essential (hemorrhagic) thrombocythemia: Secondary | ICD-10-CM

## 2017-12-08 DIAGNOSIS — I10 Essential (primary) hypertension: Secondary | ICD-10-CM | POA: Diagnosis not present

## 2017-12-08 DIAGNOSIS — M940 Chondrocostal junction syndrome [Tietze]: Secondary | ICD-10-CM

## 2017-12-08 DIAGNOSIS — R079 Chest pain, unspecified: Secondary | ICD-10-CM

## 2017-12-08 DIAGNOSIS — R1013 Epigastric pain: Secondary | ICD-10-CM | POA: Diagnosis not present

## 2017-12-08 DIAGNOSIS — K219 Gastro-esophageal reflux disease without esophagitis: Secondary | ICD-10-CM | POA: Diagnosis not present

## 2017-12-08 DIAGNOSIS — I447 Left bundle-branch block, unspecified: Secondary | ICD-10-CM | POA: Diagnosis not present

## 2017-12-08 LAB — ECHOCARDIOGRAM COMPLETE BUBBLE STUDY
Ao-asc: 30 cm
Area-P 1/2: 4.89 cm2
E decel time: 155 msec
FS: 36 % (ref 28–44)
IVS/LV PW RATIO, ED: 0.78
LA ID, A-P, ES: 47 mm
LA diam end sys: 47 mm
LA diam index: 2.49 cm/m2
LA vol A4C: 62.7 ml
LA vol index: 37.2 mL/m2
LA vol: 70.1 mL
LV PW d: 12.1 mm — AB (ref 0.6–1.1)
LVOT area: 2.54 cm2
LVOT diameter: 18 mm
MV Dec: 155
MV Peak grad: 3 mmHg
MV pk A vel: 128 m/s
MV pk E vel: 81.4 m/s
P 1/2 time: 45 ms
PISA EROA: 0.04 cm2
Reg peak vel: 218 cm/s
TAPSE: 20.4 mm
TR max vel: 218 cm/s
VTI: 186 cm

## 2017-12-08 LAB — LIPID PANEL
Cholesterol: 238 mg/dL — ABNORMAL HIGH (ref 0–200)
HDL: 36 mg/dL — ABNORMAL LOW (ref >40)
LDL Cholesterol: 129 mg/dL — ABNORMAL HIGH (ref 0–99)
Total CHOL/HDL Ratio: 6.6 RATIO
Triglycerides: 366 mg/dL — ABNORMAL HIGH (ref <150)
VLDL: 73 mg/dL — ABNORMAL HIGH (ref 0–40)

## 2017-12-08 LAB — TROPONIN I
Troponin I: <0.03 ng/mL (ref <0.03)
Troponin I: <0.03 ng/mL (ref <0.03)

## 2017-12-08 LAB — HEMOGLOBIN A1C
Hgb A1c MFr Bld: 6.1 % — ABNORMAL HIGH (ref 4.8–5.6)
Mean Plasma Glucose: 128.37 mg/dL

## 2017-12-08 NOTE — Progress Notes (Addendum)
PROGRESS NOTE  Heather Barber IOE:[id number] DOB: 1945/11/28 DOA: 12/07/2017 PCP: Dione Housekeeper, MD  HPI/Recap of past 24 hours:  Heather Barber is a 72 y.o. year old female with medical history significant for chronic LBBB, HTN, remote history of VT, ASD repair, thrombocythemia who presented on 12/07/2017 with right sided chest pain with concern for new LBBB on EKG at PCP office and was found to have right muscle strain/costochondritis. On further history, patient reports she works as a Radiation protection practitioner at [employer]. This past week they had more heavy shipments because of super bowl weekend. Additionally, a few days prior to admission she fell on that same side while walking her dog.     This morning states no chest pain. Rib cage pain on right side of rib  Assessment/Plan: Active Problems:   LBBB (left bundle branch block)   Hypertension   Thrombocytosis ([name])   Gastroesophageal reflux disease   Chest pain   Atypical chest pain, consistent with costochondritis/muscle strain, stable ACS essentially ruled out with no acute changes on EKG, negative troponin x3. Initial concern because of LBBB on EKG but this is chronic as evident on previous EKGs and her cardiologist's notes. GI etiology ruled out with normal LFTs and unremarkable CT abdomen. History and exam (reproducible with rib cage palpation and increased labor at work) more consistent with MSK. Will obtain TTE to assure no wall motion abnormalities but fully expect it to be negative. Provide supportive care with tylenol and warm compresses  HTN, at goal Continue home losartan, metoprolol, lasix, aspirin  Thrombocytosis, stable Platelets in normal range on lab. Continue home hydroxyurea  Code Status: Full Code  Family Communication: Daughter updated at bedside   Disposition Plan: plan for d/c if TTE wnl    Consultants:  None   Procedures:  none   Antimicrobials:  none   Cultures:  none  DVT  prophylaxis: Lovenox   Objective: Vitals:   12/07/17 2154 12/07/17 2159 12/08/17 0620 12/08/17 0943  BP:  133/65 117/67   Pulse:  68  85  Resp:  16    Temp:  97.6 F (36.4 C) 98 F (36.7 C)   TempSrc:  Oral Oral   SpO2:  96% 94%   Weight: 77.5 kg (170 lb 14.4 oz)  76.7 kg (169 lb 3.2 oz)   Height: 5\' 4"  (1.626 m)       Intake/Output Summary (Last 24 hours) at 12/08/2017 1032 Last data filed at 12/08/2017 0900 Gross per 24 hour  Intake 240 ml  Output 500 ml  Net -260 ml   Filed Weights   12/07/17 1559 12/07/17 2154 12/08/17 0620  Weight: 80.7 kg (178 lb) 77.5 kg (170 lb 14.4 oz) 76.7 kg (169 lb 3.2 oz)    Exam:  Gen-lying in bed, comfortable appearing CV- RRR, no m,r,g, no peripheral edema Resp-on room air, normal breath sounds, no distress MSK- reproducible pain when pressing below right rib cage and abdomen Gastro-soft, Nd/NT, normal bowel sounds Neuro-alert and oriented x4. Moving all extremities with no focal deficits  Data Reviewed: CBC: Recent Labs  Lab 12/07/17 1600 12/07/17 2224  WBC 6.3 5.9  HGB 12.8 12.9  HCT 36.7 36.8  MCV 115.8* 119.1*  PLT 286 182   Basic Metabolic Panel: Recent Labs  Lab 12/07/17 1600 12/07/17 2224  NA 140  --   K 3.6  --   CL 104  --   CO2 20*  --   GLUCOSE 115*  --  BUN 17  --   CREATININE 0.85 0.87  CALCIUM 9.7  --    GFR: Estimated Creatinine Clearance: 59.5 mL/min (by C-G formula based on SCr of 0.87 mg/dL). Liver Function Tests: Recent Labs  Lab 12/07/17 1808  AST 28  ALT 27  ALKPHOS 80  BILITOT 0.5  PROT 7.0  ALBUMIN 4.1   Recent Labs  Lab 12/07/17 1808  LIPASE 33   No results for input(s): AMMONIA in the last 168 hours. Coagulation Profile: No results for input(s): INR, PROTIME in the last 168 hours. Cardiac Enzymes: Recent Labs  Lab 12/07/17 2224 12/08/17 0202 12/08/17 0431  TROPONINI <0.03 <0.03 <0.03   BNP (last 3 results) No results for input(s): PROBNP in the last 8760  hours. HbA1C: Recent Labs    12/08/17 0202  HGBA1C 6.1*   CBG: No results for input(s): GLUCAP in the last 168 hours. Lipid Profile: Recent Labs    12/08/17 0202  CHOL 238*  HDL 36*  LDLCALC 129*  TRIG 366*  CHOLHDL 6.6   Thyroid Function Tests: No results for input(s): TSH, T4TOTAL, FREET4, T3FREE, THYROIDAB in the last 72 hours. Anemia Panel: No results for input(s): VITAMINB12, FOLATE, FERRITIN, TIBC, IRON, RETICCTPCT in the last 72 hours. Urine analysis: No results found for: COLORURINE, APPEARANCEUR, LABSPEC, PHURINE, GLUCOSEU, HGBUR, BILIRUBINUR, KETONESUR, PROTEINUR, UROBILINOGEN, NITRITE, LEUKOCYTESUR Sepsis Labs: @LABRCNTIP (procalcitonin:4,lacticidven:4)  )No results found for this or any previous visit (from the past 240 hour(s)).    Studies: Dg Chest 2 View  Result Date: 12/07/2017 CLINICAL DATA:  Chest pain. EXAM: CHEST  2 VIEW COMPARISON:  February 08, 2012 FINDINGS: Stable cardiomegaly. The hila and mediastinum are normal. No pulmonary nodules, masses, or focal infiltrates. IMPRESSION: No active cardiopulmonary disease. Electronically Signed   By: Dorise Bullion III M.D   On: 12/07/2017 16:47   Ct Abdomen Pelvis W Contrast  Result Date: 12/07/2017 CLINICAL DATA:  Epigastric region pain EXAM: CT ABDOMEN AND PELVIS WITH CONTRAST TECHNIQUE: Multidetector CT imaging of the abdomen and pelvis was performed using the standard protocol following bolus administration of intravenous contrast. CONTRAST:  100 mL ISOVUE-300 IOPAMIDOL (ISOVUE-300) INJECTION 61% COMPARISON:  None. FINDINGS: Lower chest: There is mild bibasilar fibrotic change. There is no lung base edema or consolidation. There are foci of coronary artery calcification. Hepatobiliary: Liver measures 20.1 cm in length. There is hepatic steatosis. No focal liver lesions are apparent. Gallbladder wall is not appreciably thickened. There is no biliary duct dilatation. Pancreas: There is no pancreatic mass or  inflammatory focus. Spleen: No splenic lesions are evident. Spleen is upper limits normal in size. Adrenals/Urinary Tract: Adrenals bilaterally appear normal. There is a cyst arising from the anterior mid right kidney measuring 2.4 x 2.4 cm. There is a cyst arising from the lateral left kidney measuring 1.6 x 1.6 cm. There is no hydronephrosis on either side. There is no renal or ureteral calculus on either side. Urinary bladder is midline with wall thickness within normal limits. Stomach/Bowel: There is no appreciable bowel wall or mesenteric thickening. No evident bowel obstruction. No free air or portal venous air. Vascular/Lymphatic: There is atherosclerotic calcification in the aorta and common iliac arteries. No aneurysm evident. There is no appreciable adenopathy in the abdomen or pelvis. Reproductive: Uterus is anteverted.  No evident pelvic mass. Other: No periappendiceal region inflammation is evident. Appendix is not convincingly seen. There is a small ventral hernia containing only fat. There is no abscess or ascites in the abdomen or pelvis. Musculoskeletal: There is vacuum  phenomenon at L4-5. Degenerative changes noted at several levels in the lumbar spine. There are no blastic or lytic bone lesions. There is no intramuscular or abdominal wall lesion evident. IMPRESSION: 1. Prominent liver with hepatic steatosis. Spleen upper normal in size without focal splenic lesion. 2. No appreciable bowel wall thickening or bowel obstruction. No abscess. No periappendiceal region inflammation. 3.  No renal or ureteral calculus.  No hydronephrosis. 4. Aortoiliac atherosclerosis. There are foci of coronary artery calcification. 5.  Small ventral hernia containing only fat. Aortic Atherosclerosis (ICD10-I70.0). Electronically Signed   By: Lowella Grip III M.D.   On: 12/07/2017 18:55    Scheduled Meds: . aspirin EC  81 mg Oral Daily  . enoxaparin (LOVENOX) injection  40 mg Subcutaneous Q24H  . furosemide   20 mg Oral Daily  . hydroxyurea  500 mg Oral BID  . losartan  50 mg Oral Daily  . metoprolol succinate  50 mg Oral Daily  . pantoprazole  40 mg Oral Daily    Continuous Infusions:   LOS: 0 days     Desiree Hane, MD [facility] Pager [phone]  If 7PM-7AM, please contact night-coverage [email or web address] Password [email or web address] 12/08/2017, 10:32 AM

## 2017-12-08 NOTE — Progress Notes (Signed)
  Echocardiogram 2D Echocardiogram has been performed.  Heather Barber 12/08/2017, 12:25 PM

## 2017-12-08 NOTE — Discharge Summary (Signed)
Discharge Summary  Heather Barber:[id number] DOB: [birth year]  PCP: Dione Housekeeper, MD  Admit date: 12/07/2017 Discharge date: 12/08/2017  Time spent: Less than 25 minutes  Admitted From: Home Disposition: Home Recommendations for Outpatient Follow-up:  1. Follow up with PCP in 1-2 weeks, follow supportive care for costochondritis/muscle strain.   Discharge Diagnoses:  Active Hospital Problems   Diagnosis Date Noted  . Chest pain 12/07/2017  . Gastroesophageal reflux disease 07/24/2016  . Thrombocytosis ([place]) 06/06/2016  . LBBB (left bundle branch block)   . Hypertension     Resolved Hospital Problems  No resolved problems to display.    Discharge Condition: Stable CODE STATUS: Full code Diet recommendation: Heart Healthy   Vitals:   12/08/17 0943 12/08/17 1404  BP:  119/60  Pulse: 85 85  Resp:    Temp:  98 F (36.7 C)  SpO2:  95%    History of present illness:  Heather Barber is a 72 y.o. year old female with medical history significant for chronic LBBB, HTN, remote history of VT, ASD repair, thrombocythemia who presented on 12/07/2017 with right sided chest pain with concern for new LBBB on EKG at PCP office and was found to have right muscle strain/costochondritis.   Hospital Course:  Active Problems:   LBBB (left bundle branch block)   Hypertension   Thrombocytosis ([name])   Gastroesophageal reflux disease   Chest pain  Atypical chest pain, consistent with costochondritis Patient with no history of chronic left bundle branch block.  However patient was sent to ED by her PCP in setting of chest pain with EKG showing left bundle branch block (which PCP thought was a new finding).  However on chart review, patient's cardiologist Dr. Harl Bowie notes chronic left bundle branch block which is also demonstrated on multiple EKGs in our system.  Additionally, troponin trend remain negative, there were no acute ischemic changes on EKG, and TTE showed no new wall motion  abnormalities with preserved EF making ACS very unlikely.  Furthermore, patient endorses pain is reproducible on exam consistent with MSK, particularly when pressing the rib cage.  Chest x-ray was unremarkable.  pain likely attributed to muscle strain as patient does work in a [employer] as a Radiation protection practitioner and reports lifting heavy objects this past week due to increased shipments because of the [employer] as well as a recent fall on that same side while walking her dog.  Patient was discharged and instructed to continue supportive care with warm compresses and Tylenol for pain control.  Patient instructed to follow with PCP within 1 week of hospital stay.   Consultations:  None   Procedures/Studies: Dg Chest 2 View  Result Date: 12/07/2017 CLINICAL DATA:  Chest pain. EXAM: CHEST  2 VIEW COMPARISON:  February 08, 2012 FINDINGS: Stable cardiomegaly. The hila and mediastinum are normal. No pulmonary nodules, masses, or focal infiltrates. IMPRESSION: No active cardiopulmonary disease. Electronically Signed   By: Dorise Bullion III M.D   On: 12/07/2017 16:47   Ct Abdomen Pelvis W Contrast  Result Date: 12/07/2017 CLINICAL DATA:  Epigastric region pain EXAM: CT ABDOMEN AND PELVIS WITH CONTRAST TECHNIQUE: Multidetector CT imaging of the abdomen and pelvis was performed using the standard protocol following bolus administration of intravenous contrast. CONTRAST:  100 mL ISOVUE-300 IOPAMIDOL (ISOVUE-300) INJECTION 61% COMPARISON:  None. FINDINGS: Lower chest: There is mild bibasilar fibrotic change. There is no lung base edema or consolidation. There are foci of coronary artery calcification. Hepatobiliary: Liver measures 20.1 cm in length.  There is hepatic steatosis. No focal liver lesions are apparent. Gallbladder wall is not appreciably thickened. There is no biliary duct dilatation. Pancreas: There is no pancreatic mass or inflammatory focus. Spleen: No splenic lesions are evident. Spleen is upper limits normal in  size. Adrenals/Urinary Tract: Adrenals bilaterally appear normal. There is a cyst arising from the anterior mid right kidney measuring 2.4 x 2.4 cm. There is a cyst arising from the lateral left kidney measuring 1.6 x 1.6 cm. There is no hydronephrosis on either side. There is no renal or ureteral calculus on either side. Urinary bladder is midline with wall thickness within normal limits. Stomach/Bowel: There is no appreciable bowel wall or mesenteric thickening. No evident bowel obstruction. No free air or portal venous air. Vascular/Lymphatic: There is atherosclerotic calcification in the aorta and common iliac arteries. No aneurysm evident. There is no appreciable adenopathy in the abdomen or pelvis. Reproductive: Uterus is anteverted.  No evident pelvic mass. Other: No periappendiceal region inflammation is evident. Appendix is not convincingly seen. There is a small ventral hernia containing only fat. There is no abscess or ascites in the abdomen or pelvis. Musculoskeletal: There is vacuum phenomenon at L4-5. Degenerative changes noted at several levels in the lumbar spine. There are no blastic or lytic bone lesions. There is no intramuscular or abdominal wall lesion evident. IMPRESSION: 1. Prominent liver with hepatic steatosis. Spleen upper normal in size without focal splenic lesion. 2. No appreciable bowel wall thickening or bowel obstruction. No abscess. No periappendiceal region inflammation. 3.  No renal or ureteral calculus.  No hydronephrosis. 4. Aortoiliac atherosclerosis. There are foci of coronary artery calcification. 5.  Small ventral hernia containing only fat. Aortic Atherosclerosis (ICD10-I70.0). Electronically Signed   By: Lowella Grip III M.D.   On: 12/07/2017 18:55    TTE on 12/08/17: Shows preserved EF, chronic abnormal septal motion  consistent with chronic left bundle branch block (demonstrated on prior echoes in the system) otherwise no new wall motion normalities.  Discharge  Exam: BP 119/60 (BP Location: Right Arm)   Pulse 85   Temp 98 F (36.7 C) (Oral)   Resp 16   Ht 5\' 4"  (1.626 m)   Wt 76.7 kg (169 lb 3.2 oz)   SpO2 95%   BMI 29.04 kg/m   Gen-lying in bed, comfortable appearing CV- RRR, no m,r,g, no peripheral edema Resp-on room air, normal breath sounds, no distress MSK- reproducible pain when pressing below right rib cage and abdomen Gastro-soft, Nd/NT, normal bowel sounds Neuro-alert and oriented x4. Moving all extremities with no focal deficits    Discharge Instructions You were cared for by a hospitalist during your hospital stay. If you have any questions about your discharge medications or the care you received while you were in the hospital after you are discharged, you can call the unit and asked to speak with the hospitalist on call if the hospitalist that took care of you is not available. Once you are discharged, your primary care physician will handle any further medical issues. Please note that NO REFILLS for any discharge medications will be authorized once you are discharged, as it is imperative that you return to your primary care physician (or establish a relationship with a primary care physician if you do not have one) for your aftercare needs so that they can reassess your need for medications and monitor your lab values.  Discharge Instructions    Diet - low sodium heart healthy   Complete by:  As directed  Increase activity slowly   Complete by:  As directed      Allergies as of 12/08/2017      Reactions   Codeine Nausea And Vomiting   Morphine Other (See Comments)   GI symptoms      Medication List    TAKE these medications   aspirin EC 81 MG tablet Take 81 mg by mouth daily.   Fenofibric Acid 105 MG Tabs Take 105 mg by mouth at bedtime.   Fish Oil 1200 MG Caps Take 1,200 mg by mouth daily.   furosemide 20 MG tablet Commonly known as:  LASIX TAKE 1 TABLET (20 MG TOTAL) BY MOUTH DAILY.   hydroxyurea 500 MG  capsule Commonly known as:  HYDREA TAKE ONE CAPSULE TWICE A DAY . MAY TAKE WITH FOOD TO MINIMIZE GI SIDE EFFECTS What changed:    how much to take  how to take this  when to take this  additional instructions   loratadine 10 MG tablet Commonly known as:  CLARITIN Take 10 mg by mouth daily as needed for allergies.   losartan 50 MG tablet Commonly known as:  COZAAR TAKE 1 TABLET (50 MG TOTAL) BY MOUTH DAILY.   metoprolol succinate 50 MG 24 hr tablet Commonly known as:  TOPROL-XL TAKE 1 TABLET (50 MG TOTAL) BY MOUTH DAILY. TAKE WITH OR IMMEDIATELY FOLLOWING A MEAL.   naproxen sodium 220 MG tablet Commonly known as:  ALEVE Take 440 mg by mouth 2 (two) times daily as needed (pain/headache).   omeprazole 20 MG capsule Commonly known as:  PRILOSEC Take 20 mg by mouth daily.   pravastatin 80 MG tablet Commonly known as:  PRAVACHOL TAKE 1 TABLET DAILY What changed:    how much to take  how to take this  when to take this      Allergies  Allergen Reactions  . Codeine Nausea And Vomiting  . Morphine Other (See Comments)    GI symptoms      The results of significant diagnostics from this hospitalization (including imaging, microbiology, ancillary and laboratory) are listed below for reference.    Significant Diagnostic Studies: Dg Chest 2 View  Result Date: 12/07/2017 CLINICAL DATA:  Chest pain. EXAM: CHEST  2 VIEW COMPARISON:  February 08, 2012 FINDINGS: Stable cardiomegaly. The hila and mediastinum are normal. No pulmonary nodules, masses, or focal infiltrates. IMPRESSION: No active cardiopulmonary disease. Electronically Signed   By: Dorise Bullion III M.D   On: 12/07/2017 16:47   Ct Abdomen Pelvis W Contrast  Result Date: 12/07/2017 CLINICAL DATA:  Epigastric region pain EXAM: CT ABDOMEN AND PELVIS WITH CONTRAST TECHNIQUE: Multidetector CT imaging of the abdomen and pelvis was performed using the standard protocol following bolus administration of intravenous  contrast. CONTRAST:  100 mL ISOVUE-300 IOPAMIDOL (ISOVUE-300) INJECTION 61% COMPARISON:  None. FINDINGS: Lower chest: There is mild bibasilar fibrotic change. There is no lung base edema or consolidation. There are foci of coronary artery calcification. Hepatobiliary: Liver measures 20.1 cm in length. There is hepatic steatosis. No focal liver lesions are apparent. Gallbladder wall is not appreciably thickened. There is no biliary duct dilatation. Pancreas: There is no pancreatic mass or inflammatory focus. Spleen: No splenic lesions are evident. Spleen is upper limits normal in size. Adrenals/Urinary Tract: Adrenals bilaterally appear normal. There is a cyst arising from the anterior mid right kidney measuring 2.4 x 2.4 cm. There is a cyst arising from the lateral left kidney measuring 1.6 x 1.6 cm. There is no hydronephrosis on  either side. There is no renal or ureteral calculus on either side. Urinary bladder is midline with wall thickness within normal limits. Stomach/Bowel: There is no appreciable bowel wall or mesenteric thickening. No evident bowel obstruction. No free air or portal venous air. Vascular/Lymphatic: There is atherosclerotic calcification in the aorta and common iliac arteries. No aneurysm evident. There is no appreciable adenopathy in the abdomen or pelvis. Reproductive: Uterus is anteverted.  No evident pelvic mass. Other: No periappendiceal region inflammation is evident. Appendix is not convincingly seen. There is a small ventral hernia containing only fat. There is no abscess or ascites in the abdomen or pelvis. Musculoskeletal: There is vacuum phenomenon at L4-5. Degenerative changes noted at several levels in the lumbar spine. There are no blastic or lytic bone lesions. There is no intramuscular or abdominal wall lesion evident. IMPRESSION: 1. Prominent liver with hepatic steatosis. Spleen upper normal in size without focal splenic lesion. 2. No appreciable bowel wall thickening or bowel  obstruction. No abscess. No periappendiceal region inflammation. 3.  No renal or ureteral calculus.  No hydronephrosis. 4. Aortoiliac atherosclerosis. There are foci of coronary artery calcification. 5.  Small ventral hernia containing only fat. Aortic Atherosclerosis (ICD10-I70.0). Electronically Signed   By: Lowella Grip III M.D.   On: 12/07/2017 18:55    Microbiology: No results found for this or any previous visit (from the past 240 hour(s)).   Labs: Basic Metabolic Panel: Recent Labs  Lab 12/07/17 1600 12/07/17 2224  NA 140  --   K 3.6  --   CL 104  --   CO2 20*  --   GLUCOSE 115*  --   BUN 17  --   CREATININE 0.85 0.87  CALCIUM 9.7  --    Liver Function Tests: Recent Labs  Lab 12/07/17 1808  AST 28  ALT 27  ALKPHOS 80  BILITOT 0.5  PROT 7.0  ALBUMIN 4.1   Recent Labs  Lab 12/07/17 1808  LIPASE 33   No results for input(s): AMMONIA in the last 168 hours. CBC: Recent Labs  Lab 12/07/17 1600 12/07/17 2224  WBC 6.3 5.9  HGB 12.8 12.9  HCT 36.7 36.8  MCV 115.8* 119.1*  PLT 286 263   Cardiac Enzymes: Recent Labs  Lab 12/07/17 2224 12/08/17 0202 12/08/17 0431  TROPONINI <0.03 <0.03 <0.03   BNP: BNP (last 3 results) No results for input(s): BNP in the last 8760 hours.  ProBNP (last 3 results) No results for input(s): PROBNP in the last 8760 hours.  CBG: No results for input(s): GLUCAP in the last 168 hours.     Signed:  Desiree Hane, MD [facility] 12/08/2017, 2:21 PM

## 2017-12-08 NOTE — Care Management Obs Status (Signed)
[place] [facility] NOTIFICATION   Patient Details  Name: Heather Barber MRN: [id number] Date of Birth: 1946/02/09   Medicare Observation Status Notification Given:  Yes    Zenon Mayo, RN 12/08/2017, 12:22 PM

## 2018-03-20 ENCOUNTER — Other Ambulatory Visit (HOSPITAL_COMMUNITY): Payer: Self-pay | Admitting: *Deleted

## 2018-03-20 DIAGNOSIS — D75839 Thrombocytosis, unspecified: Secondary | ICD-10-CM

## 2018-03-20 DIAGNOSIS — D473 Essential (hemorrhagic) thrombocythemia: Secondary | ICD-10-CM

## 2018-03-21 ENCOUNTER — Inpatient Hospital Stay (HOSPITAL_COMMUNITY): Payer: Medicare Other

## 2018-03-21 ENCOUNTER — Inpatient Hospital Stay (HOSPITAL_COMMUNITY): Payer: Medicare Other | Attending: Hematology | Admitting: Hematology

## 2018-03-21 ENCOUNTER — Other Ambulatory Visit: Payer: Self-pay

## 2018-03-21 ENCOUNTER — Encounter (HOSPITAL_COMMUNITY): Payer: Self-pay | Admitting: Hematology

## 2018-03-21 VITALS — BP 134/70 | HR 82 | Temp 97.8°F | Resp 18 | Wt 176.8 lb

## 2018-03-21 DIAGNOSIS — D75839 Thrombocytosis, unspecified: Secondary | ICD-10-CM

## 2018-03-21 DIAGNOSIS — Z1589 Genetic susceptibility to other disease: Secondary | ICD-10-CM

## 2018-03-21 DIAGNOSIS — D473 Essential (hemorrhagic) thrombocythemia: Secondary | ICD-10-CM | POA: Diagnosis present

## 2018-03-21 LAB — COMPREHENSIVE METABOLIC PANEL
ALT: 24 U/L (ref 14–54)
AST: 27 U/L (ref 15–41)
Albumin: 4.5 g/dL (ref 3.5–5.0)
Alkaline Phosphatase: 91 U/L (ref 38–126)
Anion gap: 10 (ref 5–15)
BUN: 23 mg/dL — ABNORMAL HIGH (ref 6–20)
CO2: 29 mmol/L (ref 22–32)
Calcium: 9.7 mg/dL (ref 8.9–10.3)
Chloride: 101 mmol/L (ref 101–111)
Creatinine, Ser: 0.83 mg/dL (ref 0.44–1.00)
GFR calc Af Amer: >60 mL/min (ref >60)
GFR calc non Af Amer: >60 mL/min (ref >60)
Glucose, Bld: 112 mg/dL — ABNORMAL HIGH (ref 65–99)
Potassium: 4.4 mmol/L (ref 3.5–5.1)
Sodium: 140 mmol/L (ref 135–145)
Total Bilirubin: 0.7 mg/dL (ref 0.3–1.2)
Total Protein: 7.5 g/dL (ref 6.5–8.1)

## 2018-03-21 LAB — CBC WITH DIFFERENTIAL/PLATELET
Basophils Absolute: 0 10*3/uL (ref 0.0–0.1)
Basophils Relative: 0 %
Eosinophils Absolute: 0.1 10*3/uL (ref 0.0–0.7)
Eosinophils Relative: 2 %
HCT: 36.8 % (ref 36.0–46.0)
Hemoglobin: 12.6 g/dL (ref 12.0–15.0)
Lymphocytes Relative: 46 %
Lymphs Abs: 2.4 10*3/uL (ref 0.7–4.0)
MCH: 40.4 pg — ABNORMAL HIGH (ref 26.0–34.0)
MCHC: 34.2 g/dL (ref 30.0–36.0)
MCV: 117.9 fL — ABNORMAL HIGH (ref 78.0–100.0)
Monocytes Absolute: 0.4 10*3/uL (ref 0.1–1.0)
Monocytes Relative: 7 %
Neutro Abs: 2.3 10*3/uL (ref 1.7–7.7)
Neutrophils Relative %: 45 %
Platelets: 262 10*3/uL (ref 150–400)
RBC: 3.12 MIL/uL — ABNORMAL LOW (ref 3.87–5.11)
RDW: 13.4 % (ref 11.5–15.5)
WBC: 5.2 10*3/uL (ref 4.0–10.5)

## 2018-03-21 NOTE — Progress Notes (Signed)
Patient Care Team: Dione Housekeeper, MD as PCP - General (Family Medicine) Felicie Morn, MD (Inactive) (General Surgery) Rothbart, Cristopher Estimable, MD as Attending Physician (Cardiology)  DIAGNOSIS:  Encounter Diagnoses  Name Primary?  . Thrombocytosis ([place]) Yes  . JAK-2 gene mutation      CHIEF COMPLIANT: Thrombocytosis; JAK2 positive mutation  INTERVAL HISTORY: Heather Barber is a 72 y.o. female here for routine follow-up for thrombocytosis.   Here today unaccompanied; her husband is currently hospitalized after having several strokes.   Remains on Hydrea 500 mg BID; she is taking as prescribed.  Denies any LE wounds. She has occasional nausea, which does not require anti-emetics.  No fevers or infections in past 6 months.    Labs reviewed.  Platelet count stable at 262,000, which is at goal of <400,000.  Her WBCs and Hgb are both normal as well.    Recommend continued dose of Hydrea and we will continue to monitor.    REVIEW OF SYSTEMS:   Constitutional: Denies fevers, chills or abnormal weight loss Eyes: Denies blurriness of vision Ears, nose, mouth, throat, and face: Denies mucositis or sore throat Respiratory: Denies cough, dyspnea or wheezes Cardiovascular: Denies palpitation, chest discomfort Gastrointestinal:  Denies nausea, heartburn or change in bowel habits Skin: Denies abnormal skin rashes Lymphatics: Denies new lymphadenopathy or easy bruising Neurological:Denies numbness, tingling or new weaknesses Behavioral/Psych: Mood is stable, no new changes  Extremities: No lower extremity edema All other systems were reviewed with the patient and are negative.  I have reviewed the past medical history, past surgical history, social history and family history with the patient and they are unchanged from previous note.  ALLERGIES:  is allergic to codeine and morphine.  MEDICATIONS:  Current Outpatient Medications  Medication Sig Dispense Refill  . aspirin EC 81 MG  tablet Take 81 mg by mouth daily.    . Fenofibric Acid 105 MG TABS Take 105 mg by mouth at bedtime.     . furosemide (LASIX) 20 MG tablet TAKE 1 TABLET (20 MG TOTAL) BY MOUTH DAILY. 90 tablet 3  . hydroxyurea (HYDREA) 500 MG capsule TAKE ONE CAPSULE TWICE A DAY . MAY TAKE WITH FOOD TO MINIMIZE GI SIDE EFFECTS (Patient taking differently: Take 500 mg by mouth 2 (two) times daily. MAY TAKE WITH FOOD TO MINIMIZE GI SIDE EFFECTS) 180 capsule 1  . loratadine (CLARITIN) 10 MG tablet Take 10 mg by mouth daily as needed for allergies.    Marland Kitchen losartan (COZAAR) 50 MG tablet TAKE 1 TABLET (50 MG TOTAL) BY MOUTH DAILY. 90 tablet 3  . metoprolol succinate (TOPROL-XL) 50 MG 24 hr tablet TAKE 1 TABLET (50 MG TOTAL) BY MOUTH DAILY. TAKE WITH OR IMMEDIATELY FOLLOWING A MEAL. 30 tablet 11  . naproxen sodium (ALEVE) 220 MG tablet Take 440 mg by mouth 2 (two) times daily as needed (pain/headache).    . Omega-3 Fatty Acids (FISH OIL) 1200 MG CAPS Take 1,200 mg by mouth daily.    Marland Kitchen omeprazole (PRILOSEC) 20 MG capsule Take 20 mg by mouth daily.      . pravastatin (PRAVACHOL) 80 MG tablet TAKE 1 TABLET DAILY (Patient taking differently: TAKE 1 TABLET (80 MG) BY MOUTH  DAILY AT BEDTIME) 30 tablet 6   No current facility-administered medications for this visit.     PHYSICAL EXAMINATION: ECOG PERFORMANCE STATUS: 1 - Symptomatic but completely ambulatory  Vitals:   03/21/18 1525  BP: 134/70  Pulse: 82  Resp: 18  Temp: 97.8 F (36.6  C)  SpO2: 100%   Filed Weights   03/21/18 1525  Weight: 176 lb 12.8 oz (80.2 kg)    GENERAL:alert, no distress and comfortable SKIN: skin color, texture, turgor are normal, no rashes or significant lesions EYES: normal, Conjunctiva are pink and non-injected, sclera clear Abdomen: No palpable hepatosplenomegaly.   LABORATORY DATA:  I have reviewed the data as listed CMP Latest Ref Rng & Units 03/21/2018 12/07/2017 12/07/2017  Glucose 65 - 99 mg/dL 112(H) - -  BUN 6 - 20 mg/dL 23(H)  - -  Creatinine 0.44 - 1.00 mg/dL 0.83 0.87 -  Sodium 135 - 145 mmol/L 140 - -  Potassium 3.5 - 5.1 mmol/L 4.4 - -  Chloride 101 - 111 mmol/L 101 - -  CO2 22 - 32 mmol/L 29 - -  Calcium 8.9 - 10.3 mg/dL 9.7 - -  Total Protein 6.5 - 8.1 g/dL 7.5 - 7.0  Total Bilirubin 0.3 - 1.2 mg/dL 0.7 - 0.5  Alkaline Phos 38 - 126 U/L 91 - 80  AST 15 - 41 U/L 27 - 28  ALT 14 - 54 U/L 24 - 27   No results found for: [id number]   Lab Results  Component Value Date   WBC 5.2 03/21/2018   HGB 12.6 03/21/2018   HCT 36.8 03/21/2018   MCV 117.9 (H) 03/21/2018   PLT 262 03/21/2018   NEUTROABS 2.3 03/21/2018    ASSESSMENT & PLAN:  Thrombocytosis ([name]) 1.  [name]: -Patient currently taking hydroxyurea 500 mg twice daily.  Denies any adverse effects from hydroxyurea.  Denies any vasomotor symptoms. -Platelet count today is 262.  Hematocrit is 36.8.  We will continue same dose of hydroxyurea. -We will see her back in 6 months for follow-up.  She will continue baby aspirin daily.    Orders Placed This Encounter  Procedures  . CBC with Differential/Platelet    Standing Status:   Future    Standing Expiration Date:   03/21/2019  . Comprehensive metabolic panel    Standing Status:   Future    Standing Expiration Date:   03/21/2019    Order Specific Question:   Has the patient fasted?    Answer:   No   The patient has a good understanding of the overall plan. she agrees with it. she will call with any problems that may develop before the next visit here.  This note includes documentation from Mike Craze, NP, who was present during this patient's office visit and evaluation.  I have reviewed this note for its completeness and accuracy.  I have edited this note accordingly based on my findings and medical opinion.      Derek Jack, MD 03/21/18

## 2018-03-21 NOTE — Patient Instructions (Signed)
[facility] at [facility] Discharge Instructions  Today you saw Dr. K.   Thank you for choosing [facility] at [facility] to provide your oncology and hematology care.  To afford each patient quality time with our provider, please arrive at least 15 minutes before your scheduled appointment time.   If you have a lab appointment with the Cancer Center please come in thru the  Main Entrance and check in at the main information desk  You need to re-schedule your appointment should you arrive 10 or more minutes late.  We strive to give you quality time with our providers, and arriving late affects you and other patients whose appointments are after yours.  Also, if you no show three or more times for appointments you may be dismissed from the clinic at the providers discretion.     Again, thank you for choosing [facility].  Our hope is that these requests will decrease the amount of time that you wait before being seen by our physicians.       _____________________________________________________________  Should you have questions after your visit to [facility], please contact our office at [phone] between the hours of 8:30 a.m. and 4:30 p.m.  Voicemails left after 4:30 p.m. will not be returned until the following business day.  For prescription refill requests, have your pharmacy contact our office.       Resources For Cancer Patients and their Caregivers ? American Cancer Society: Can assist with transportation, wigs, general needs, runs Look Good Feel Better.        1-888-227-6333 ? Cancer Care: Provides financial assistance, online support groups, medication/co-pay assistance.  1-800-813-HOPE (4673) ? [facility] Assists [place] cancer patients and their families through emotional , educational and financial support.  336-427-4357 ? [place] DSS Where to apply for food  stamps, Medicaid and utility assistance. 336-342-1394 ? [facility]: Transportation to medical appointments. 336-347-2287 ? Social Security Administration: May apply for disability if have a Stage IV cancer. 336-342-7796 1-800-772-1213 ? [place] Aging, Disability and Transit Services: Assists with nutrition, care and transit needs. 336-349-2343  Cancer Center Support Programs:   > Cancer Support Group  2nd Tuesday of the month 1pm-2pm, Journey Room   > Creative Journey  3rd Tuesday of the month 1130am-1pm, Journey Room    

## 2018-03-21 NOTE — Assessment & Plan Note (Signed)
1.  [name]: -Patient currently taking hydroxyurea 500 mg twice daily.  Denies any adverse effects from hydroxyurea.  Denies any vasomotor symptoms. -Platelet count today is 262.  Hematocrit is 36.8.  We will continue same dose of hydroxyurea. -We will see her back in 6 months for follow-up.  She will continue baby aspirin daily.

## 2018-05-27 ENCOUNTER — Other Ambulatory Visit (HOSPITAL_COMMUNITY): Payer: Self-pay | Admitting: *Deleted

## 2018-05-27 DIAGNOSIS — D473 Essential (hemorrhagic) thrombocythemia: Secondary | ICD-10-CM

## 2018-05-27 MED ORDER — HYDROXYUREA 500 MG PO CAPS: ORAL_CAPSULE | ORAL | 1 refills | Status: DC

## 2018-05-27 NOTE — Telephone Encounter (Signed)
Chart reviewed, refilled Hydrea per Dr. Tomie China last office note.

## 2018-06-10 DIAGNOSIS — E119 Type 2 diabetes mellitus without complications: Secondary | ICD-10-CM | POA: Insufficient documentation

## 2018-06-28 ENCOUNTER — Other Ambulatory Visit: Payer: Self-pay | Admitting: Cardiology

## 2018-07-08 ENCOUNTER — Other Ambulatory Visit: Payer: Self-pay | Admitting: Cardiology

## 2018-08-17 ENCOUNTER — Other Ambulatory Visit: Payer: Self-pay | Admitting: Cardiology

## 2018-09-30 ENCOUNTER — Encounter (HOSPITAL_COMMUNITY): Payer: Self-pay | Admitting: Hematology

## 2018-09-30 ENCOUNTER — Inpatient Hospital Stay (HOSPITAL_COMMUNITY): Payer: Medicare Other | Attending: Hematology | Admitting: Hematology

## 2018-09-30 ENCOUNTER — Inpatient Hospital Stay (HOSPITAL_COMMUNITY): Payer: Medicare Other | Attending: Hematology

## 2018-09-30 ENCOUNTER — Other Ambulatory Visit: Payer: Self-pay

## 2018-09-30 DIAGNOSIS — D473 Essential (hemorrhagic) thrombocythemia: Secondary | ICD-10-CM | POA: Insufficient documentation

## 2018-09-30 DIAGNOSIS — D75839 Thrombocytosis, unspecified: Secondary | ICD-10-CM

## 2018-09-30 DIAGNOSIS — Z7982 Long term (current) use of aspirin: Secondary | ICD-10-CM | POA: Diagnosis not present

## 2018-09-30 DIAGNOSIS — I10 Essential (primary) hypertension: Secondary | ICD-10-CM | POA: Insufficient documentation

## 2018-09-30 DIAGNOSIS — Z1589 Genetic susceptibility to other disease: Secondary | ICD-10-CM

## 2018-09-30 LAB — CBC WITH DIFFERENTIAL/PLATELET
Abs Immature Granulocytes: 0.01 10*3/uL (ref 0.00–0.07)
Basophils Absolute: 0 10*3/uL (ref 0.0–0.1)
Basophils Relative: 1 %
Eosinophils Absolute: 0.1 10*3/uL (ref 0.0–0.5)
Eosinophils Relative: 1 %
HCT: 36 % (ref 36.0–46.0)
Hemoglobin: 12.7 g/dL (ref 12.0–15.0)
Immature Granulocytes: 0 %
Lymphocytes Relative: 44 %
Lymphs Abs: 2.4 10*3/uL (ref 0.7–4.0)
MCH: 40.7 pg — ABNORMAL HIGH (ref 26.0–34.0)
MCHC: 35.3 g/dL (ref 30.0–36.0)
MCV: 115.4 fL — ABNORMAL HIGH (ref 80.0–100.0)
Monocytes Absolute: 0.5 10*3/uL (ref 0.1–1.0)
Monocytes Relative: 9 %
Neutro Abs: 2.5 10*3/uL (ref 1.7–7.7)
Neutrophils Relative %: 45 %
Platelets: 266 10*3/uL (ref 150–400)
RBC: 3.12 MIL/uL — ABNORMAL LOW (ref 3.87–5.11)
RDW: 13.2 % (ref 11.5–15.5)
WBC: 5.4 10*3/uL (ref 4.0–10.5)
nRBC: 0 % (ref 0.0–0.2)

## 2018-09-30 LAB — COMPREHENSIVE METABOLIC PANEL
ALT: 25 U/L (ref 0–44)
AST: 29 U/L (ref 15–41)
Albumin: 4.5 g/dL (ref 3.5–5.0)
Alkaline Phosphatase: 87 U/L (ref 38–126)
Anion gap: 11 (ref 5–15)
BUN: 12 mg/dL (ref 8–23)
CO2: 26 mmol/L (ref 22–32)
Calcium: 9.1 mg/dL (ref 8.9–10.3)
Chloride: 100 mmol/L (ref 98–111)
Creatinine, Ser: 0.65 mg/dL (ref 0.44–1.00)
GFR calc Af Amer: >60 mL/min (ref >60)
GFR calc non Af Amer: >60 mL/min (ref >60)
Glucose, Bld: 150 mg/dL — ABNORMAL HIGH (ref 70–99)
Potassium: 3.7 mmol/L (ref 3.5–5.1)
Sodium: 137 mmol/L (ref 135–145)
Total Bilirubin: 0.7 mg/dL (ref 0.3–1.2)
Total Protein: 7.3 g/dL (ref 6.5–8.1)

## 2018-09-30 NOTE — Patient Instructions (Signed)
[facility] at [facility] Discharge Instructions  Follow up in 6 months with labs    Thank you for choosing [facility] at [facility] to provide your oncology and hematology care.  To afford each patient quality time with our provider, please arrive at least 15 minutes before your scheduled appointment time.   If you have a lab appointment with the Cancer Center please come in thru the  Main Entrance and check in at the main information desk  You need to re-schedule your appointment should you arrive 10 or more minutes late.  We strive to give you quality time with our providers, and arriving late affects you and other patients whose appointments are after yours.  Also, if you no show three or more times for appointments you may be dismissed from the clinic at the providers discretion.     Again, thank you for choosing [facility].  Our hope is that these requests will decrease the amount of time that you wait before being seen by our physicians.       _____________________________________________________________  Should you have questions after your visit to [facility], please contact our office at [phone] between the hours of 8:00 a.m. and 4:30 p.m.  Voicemails left after 4:00 p.m. will not be returned until the following business day.  For prescription refill requests, have your pharmacy contact our office and allow 72 hours.    Cancer Center Support Programs:   > Cancer Support Group  2nd Tuesday of the month 1pm-2pm, Journey Room    

## 2018-09-30 NOTE — Assessment & Plan Note (Signed)
1.  [name]: -Did not have any prior history of thrombosis.  Denies any vasomotor symptoms. -Because of her advanced age, she was started on hydroxyurea.  She is currently taking hydroxyurea 500 mg twice daily. -She is continuing aspirin 81 mg daily. -She does have occasional nausea from hydroxyurea.  Denies any vomiting.  Denies any fevers or infections. - We discussed the blood work today.  Hematocrit is 36.  Platelet count is 266.  She will continue same dose of hydroxyurea.  Physical examination did not reveal any splenomegaly. -We will see her back in 6 months for follow-up with labs.

## 2018-09-30 NOTE — Progress Notes (Signed)
[place], [place] [address]   CLINIC:  Medical Oncology/Hematology  PCP:  Dione Housekeeper, MD [address] [place] [id number] [phone]   REASON FOR VISIT: Follow-up for thrombocytosis, JAK-2 gene mutation  CURRENT THERAPY: Hydrea 500mg  BID  BRIEF ONCOLOGIC HISTORY:    Thrombocytosis ([place])   10/05/2015 Initial Diagnosis    Essential thrombocythemia ([place])    10/06/2015 -  Chemotherapy    Hydrea       INTERVAL HISTORY:  Ms. Megna 72 y.o. female returns for routine follow-up for thrombocytosis. She is tolerating the hydrea well with no issues. She denies any itching after hot shower. Denies any sores on her legs or peeling of skin. Denies any numbness or tingling in hands or feet. Denies any bleeding or easy bruising. She report her appetite and energy level at 100% and she has no problem maintaining her weight.     REVIEW OF SYSTEMS:  Review of Systems  All other systems reviewed and are negative.    PAST MEDICAL/SURGICAL HISTORY:  Past Medical History:  Diagnosis Date  . Atrial septal defect    Surgical repair in 1994  . Chest pain    Normal coronary angiography in 1994 and 2011  . Dizziness   . Essential thrombocythemia ([place]) 06/06/2016  . GERD (gastroesophageal reflux disease)   . Hyperlipidemia   . Hypertension   . LBBB (left bundle branch block) 2011  . Palpitations   . Syncope   . Ventricular tachycardia ([place])    Exercise induced   Past Surgical History:  Procedure Laterality Date  . ASD [place] EXCISIONAL BIOPSY     Right and left  . CATARACT EXTRACTION W/PHACO Left 03/11/2013   Procedure: CATARACT EXTRACTION PHACO AND INTRAOCULAR LENS PLACEMENT (IOC);  Surgeon: [name], MD;  Location: [facility];  Service: Ophthalmology;  Laterality: Left;  CDE:  12.20  . TONSILLECTOMY    . TUBAL LIGATION     Bilateral     SOCIAL HISTORY:  Social History    Socioeconomic History  . Marital status: Married    Spouse name: Not on file  . Number of children: Not on file  . Years of education: Not on file  . Highest education level: Not on file  Occupational History  . Occupation: Psychologist, prison and probation services  Social Needs  . Financial resource strain: Not on file  . Food insecurity:    Worry: Not on file    Inability: Not on file  . Transportation needs:    Medical: Not on file    Non-medical: Not on file  Tobacco Use  . Smoking status: Never Smoker  . Smokeless tobacco: Never Used  Substance and Sexual Activity  . Alcohol use: No  . Drug use: No  . Sexual activity: Never    Birth control/protection: Post-menopausal  Lifestyle  . Physical activity:    Days per week: Not on file    Minutes per session: Not on file  . Stress: Not on file  Relationships  . Social connections:    Talks on phone: Not on file    Gets together: Not on file    Attends religious service: Not on file    Active member of club or organization: Not on file    Attends meetings of clubs or organizations: Not on file    Relationship status: Not on file  . Intimate partner violence:  Fear of current or ex partner: Not on file    Emotionally abused: Not on file    Physically abused: Not on file    Forced sexual activity: Not on file  Other Topics Concern  . Not on file  Social History Narrative   Married and resides in [place] with husband and 3 children   Sedentary lifestyle    FAMILY HISTORY:  Family History  Problem Relation Age of Onset  . Cancer Mother   . Stroke Sister   . Cancer Sister   . Heart failure Father   . Coronary artery disease Neg Hx     CURRENT MEDICATIONS:  Outpatient Encounter Medications as of 09/30/2018  Medication Sig  . aspirin EC 81 MG tablet Take 81 mg by mouth daily.  . Fenofibric Acid 105 MG TABS Take 105 mg by mouth at bedtime.   . furosemide (LASIX) 20 MG tablet TAKE 1 TABLET (20 MG TOTAL) BY MOUTH DAILY.  .  hydroxyurea (HYDREA) 500 MG capsule TAKE ONE CAPSULE TWICE A DAY . MAY TAKE WITH FOOD TO MINIMIZE GI SIDE EFFECTS  . loratadine (CLARITIN) 10 MG tablet Take 10 mg by mouth daily as needed for allergies.  Marland Kitchen losartan (COZAAR) 50 MG tablet TAKE 1 TABLET (50 MG TOTAL) BY MOUTH DAILY.  . metoprolol succinate (TOPROL-XL) 50 MG 24 hr tablet TAKE 1 TABLET (50 MG TOTAL) BY MOUTH DAILY. TAKE WITH OR IMMEDIATELY FOLLOWING A MEAL.  . naproxen sodium (ALEVE) 220 MG tablet Take 440 mg by mouth 2 (two) times daily as needed (pain/headache).  . Omega-3 Fatty Acids (FISH OIL) 1200 MG CAPS Take 1,200 mg by mouth daily.  Marland Kitchen omeprazole (PRILOSEC) 20 MG capsule Take 20 mg by mouth daily.    . pravastatin (PRAVACHOL) 80 MG tablet TAKE 1 TABLET DAILY (Patient taking differently: TAKE 1 TABLET (80 MG) BY MOUTH  DAILY AT BEDTIME)   No facility-administered encounter medications on file as of 09/30/2018.     ALLERGIES:  Allergies  Allergen Reactions  . Codeine Nausea And Vomiting  . Morphine Other (See Comments)    GI symptoms     PHYSICAL EXAM:  ECOG Performance status: 1  Vitals:   09/30/18 1431  BP: (!) 150/69  Pulse: (!) 107  Resp: 18  Temp: 98.2 F (36.8 C)  SpO2: 96%   Filed Weights   09/30/18 1431  Weight: 177 lb 9.6 oz (80.6 kg)    Physical Exam  Constitutional: She is oriented to person, place, and time. She appears well-developed and well-nourished.  Musculoskeletal: Normal range of motion.  Neurological: She is alert and oriented to person, place, and time.  Skin: Skin is warm and dry.  Psychiatric: She has a normal mood and affect. Her behavior is normal. Judgment and thought content normal.  Abdomen: No splenomegaly.  No palpable lymphadenopathy. Skin: No ulcers in the lower extremities.   LABORATORY DATA:  I have reviewed the labs as listed.  CBC    Component Value Date/Time   WBC 5.4 09/30/2018 1331   RBC 3.12 (L) 09/30/2018 1331   HGB 12.7 09/30/2018 1331   HCT 36.0  09/30/2018 1331   PLT 266 09/30/2018 1331   MCV 115.4 (H) 09/30/2018 1331   MCH 40.7 (H) 09/30/2018 1331   MCHC 35.3 09/30/2018 1331   RDW 13.2 09/30/2018 1331   LYMPHSABS 2.4 09/30/2018 1331   MONOABS 0.5 09/30/2018 1331   EOSABS 0.1 09/30/2018 1331   BASOSABS 0.0 09/30/2018 1331   CMP Latest Ref  Rng & Units 09/30/2018 03/21/2018 12/07/2017  Glucose 70 - 99 mg/dL 150(H) 112(H) -  BUN 8 - 23 mg/dL 12 23(H) -  Creatinine 0.44 - 1.00 mg/dL 0.65 0.83 0.87  Sodium 135 - 145 mmol/L 137 140 -  Potassium 3.5 - 5.1 mmol/L 3.7 4.4 -  Chloride 98 - 111 mmol/L 100 101 -  CO2 22 - 32 mmol/L 26 29 -  Calcium 8.9 - 10.3 mg/dL 9.1 9.7 -  Total Protein 6.5 - 8.1 g/dL 7.3 7.5 -  Total Bilirubin 0.3 - 1.2 mg/dL 0.7 0.7 -  Alkaline Phos 38 - 126 U/L 87 91 -  AST 15 - 41 U/L 29 27 -  ALT 0 - 44 U/L 25 24 -     I have reviewed Francene Finders, NP's note and agree with the documentation.  I personally performed a face-to-face visit, made revisions and my assessment and plan is as follows.      ASSESSMENT & PLAN:   Thrombocytosis ([place]) 1.  [name]: -Did not have any prior history of thrombosis.  Denies any vasomotor symptoms. -Because of her advanced age, she was started on hydroxyurea.  She is currently taking hydroxyurea 500 mg twice daily. -She is continuing aspirin 81 mg daily. -She does have occasional nausea from hydroxyurea.  Denies any vomiting.  Denies any fevers or infections. - We discussed the blood work today.  Hematocrit is 36.  Platelet count is 266.  She will continue same dose of hydroxyurea.  Physical examination did not reveal any splenomegaly. -We will see her back in 6 months for follow-up with labs.      Orders placed this encounter:  Orders Placed This Encounter  Procedures  . CBC with Differential/Platelet  . Comprehensive metabolic panel      Derek Jack, MD [place] [phone]

## 2018-11-08 ENCOUNTER — Encounter: Payer: Self-pay | Admitting: Cardiology

## 2018-11-08 ENCOUNTER — Ambulatory Visit (INDEPENDENT_AMBULATORY_CARE_PROVIDER_SITE_OTHER): Payer: Medicare Other | Admitting: Cardiology

## 2018-11-08 VITALS — BP 124/68 | HR 100 | Ht 63.0 in | Wt 175.0 lb

## 2018-11-08 DIAGNOSIS — I472 Ventricular tachycardia, unspecified: Secondary | ICD-10-CM

## 2018-11-08 DIAGNOSIS — E782 Mixed hyperlipidemia: Secondary | ICD-10-CM

## 2018-11-08 DIAGNOSIS — I10 Essential (primary) hypertension: Secondary | ICD-10-CM | POA: Diagnosis not present

## 2018-11-08 NOTE — Progress Notes (Signed)
Clinical Summary Ms. Benton is a 73 y.o.female seen today for follow up of the following medical problems.    1. Ventricular tachycardia - remote history per notes, has had no recurrence - normal cath perviously, 1994 and 2011  - no recent symptoms  2. ASD repair - reports was done in her 77s, repaired at [facility] - echo 01/2014 shows no active ASD or shunt    3. HTN - home bp's 130s/60s, compliant with meds  4. Hyperlipidemia 11/2017 TC 238 TG 366 HDL 36 LDL 129  5. Thrombocythemia - followed by heme/onc, on hydrea.  6. Chronic LBBB - no evidence of underlying heart disease by caths or echos   Past Medical History:  Diagnosis Date  . Atrial septal defect    Surgical repair in 1994  . Chest pain    Normal coronary angiography in 1994 and 2011  . Dizziness   . Essential thrombocythemia ([place]) 06/06/2016  . GERD (gastroesophageal reflux disease)   . Hyperlipidemia   . Hypertension   . LBBB (left bundle [place] block) 2011  . Palpitations   . Syncope   . Ventricular tachycardia ([name])    Exercise induced     Allergies  Allergen Reactions  . Codeine Nausea And Vomiting  . Morphine Other (See Comments)    GI symptoms     Current Outpatient Medications  Medication Sig Dispense Refill  . aspirin EC 81 MG tablet Take 81 mg by mouth daily.    . Fenofibric Acid 105 MG TABS Take 105 mg by mouth at bedtime.     . furosemide (LASIX) 20 MG tablet TAKE 1 TABLET (20 MG TOTAL) BY MOUTH DAILY. 90 tablet 3  . hydroxyurea (HYDREA) 500 MG capsule TAKE ONE CAPSULE TWICE A DAY . MAY TAKE WITH FOOD TO MINIMIZE GI SIDE EFFECTS 180 capsule 1  . loratadine (CLARITIN) 10 MG tablet Take 10 mg by mouth daily as needed for allergies.    Marland Kitchen losartan (COZAAR) 50 MG tablet TAKE 1 TABLET (50 MG TOTAL) BY MOUTH DAILY. 90 tablet 3  . metoprolol succinate (TOPROL-XL) 50 MG 24 hr tablet TAKE 1 TABLET (50 MG TOTAL) BY MOUTH DAILY. TAKE WITH OR IMMEDIATELY FOLLOWING A MEAL. 90 tablet 3    . naproxen sodium (ALEVE) 220 MG tablet Take 440 mg by mouth 2 (two) times daily as needed (pain/headache).    . Omega-3 Fatty Acids (FISH OIL) 1200 MG CAPS Take 1,200 mg by mouth daily.    Marland Kitchen omeprazole (PRILOSEC) 20 MG capsule Take 20 mg by mouth daily.      . pravastatin (PRAVACHOL) 80 MG tablet TAKE 1 TABLET DAILY (Patient taking differently: TAKE 1 TABLET (80 MG) BY MOUTH  DAILY AT BEDTIME) 30 tablet 6   No current facility-administered medications for this visit.      Past Surgical History:  Procedure Laterality Date  . ASD [place] EXCISIONAL BIOPSY     Right and left  . CATARACT EXTRACTION W/PHACO Left 03/11/2013   Procedure: CATARACT EXTRACTION PHACO AND INTRAOCULAR LENS PLACEMENT (IOC);  Surgeon: [name], MD;  Location: [facility];  Service: Ophthalmology;  Laterality: Left;  CDE:  12.20  . TONSILLECTOMY    . TUBAL LIGATION     Bilateral     Allergies  Allergen Reactions  . Codeine Nausea And Vomiting  . Morphine Other (See Comments)    GI symptoms      Family History  Problem Relation Age of Onset  . Cancer Mother   . Stroke Sister   . Cancer Sister   . Heart failure Father   . Coronary artery disease Neg Hx      Social History Ms. Gougeon reports that she has never smoked. She has never used smokeless tobacco. Ms. Greenspan reports no history of alcohol use.   Review of Systems CONSTITUTIONAL: No weight loss, fever, chills, weakness or fatigue.  HEENT: Eyes: No visual loss, blurred vision, double vision or yellow sclerae.No hearing loss, sneezing, congestion, runny nose or sore throat.  SKIN: No rash or itching.  CARDIOVASCULAR: per hpi RESPIRATORY: No shortness of breath, cough or sputum.  GASTROINTESTINAL: No anorexia, nausea, vomiting or diarrhea. No abdominal pain or blood.  GENITOURINARY: No burning on urination, no polyuria NEUROLOGICAL: No headache, dizziness, syncope, paralysis, ataxia, numbness or tingling  in the extremities. No change in bowel or bladder control.  MUSCULOSKELETAL: No muscle, back pain, joint pain or stiffness.  LYMPHATICS: No enlarged nodes. No history of splenectomy.  PSYCHIATRIC: No history of depression or anxiety.  ENDOCRINOLOGIC: No reports of sweating, cold or heat intolerance. No polyuria or polydipsia.  Marland Kitchen   Physical Examination Vitals:   11/08/18 1419  BP: 124/68  Pulse: 100  SpO2: 96%   Vitals:   11/08/18 1419  Weight: 175 lb (79.4 kg)  Height: 5\' 3"  (1.6 m)    Gen: resting comfortably, no acute distress HEENT: no scleral icterus, pupils equal round and reactive, no palptable cervical adenopathy,  CV: RRR, no m/r/g, no jvd Resp: Clear to auscultation bilaterally GI: abdomen is soft, non-tender, non-distended, normal bowel sounds, no hepatosplenomegaly MSK: extremities are warm, no edema.  Skin: warm, no rash Neuro:  no focal deficits Psych: appropriate affect   Diagnostic Studies 01/2012 Echo Left ventricle: The cavity size was normal. Wall thickness was normal. Systolic function was mildly to moderately reduced. The estimated ejection fraction was in the range of 40% to 45%. - Ventricular septum: Septal motion showed paradox. - Aortic valve: Mildly calcified annulus. Trileaflet; mildly calcified leaflets. - Mitral valve: Mild to moderate regurgitation. - Left atrium: The atrium was mildly dilated. - Right ventricle: The cavity size was mildly dilated. Wall thickness was normal. - Right atrium: The atrium was mildly dilated. - Atrial septum: No defect or patent foramen ovale was identified. - Pulmonary arteries: PA peak pressure: 58mm Hg (S). - Pericardium, extracardiac: Minimal ascites was noted.     Assessment and Plan  1. Ventricular tachycardia - remote history, no recurrence - no recent symptoms, continue beta blocker  2. HTN - she is at goal, continue current meds  3. Hyperlipidemia -request labs from pcp - continue  current therapy   EKG today shows SR, LBBB which is chronic.    F/u 1 year   [name], M.D.

## 2018-11-08 NOTE — Patient Instructions (Signed)

## 2018-11-12 ENCOUNTER — Other Ambulatory Visit (HOSPITAL_COMMUNITY): Payer: Self-pay | Admitting: Hematology

## 2018-11-12 DIAGNOSIS — D473 Essential (hemorrhagic) thrombocythemia: Secondary | ICD-10-CM

## 2018-11-26 ENCOUNTER — Other Ambulatory Visit (HOSPITAL_COMMUNITY): Payer: Self-pay | Admitting: Family Medicine

## 2018-11-26 DIAGNOSIS — Z1231 Encounter for screening mammogram for malignant neoplasm of breast: Secondary | ICD-10-CM

## 2018-11-29 ENCOUNTER — Other Ambulatory Visit (HOSPITAL_COMMUNITY): Payer: Self-pay | Admitting: *Deleted

## 2018-11-29 DIAGNOSIS — D473 Essential (hemorrhagic) thrombocythemia: Secondary | ICD-10-CM

## 2018-11-29 MED ORDER — HYDROXYUREA 500 MG PO CAPS: ORAL_CAPSULE | ORAL | 1 refills | Status: DC

## 2018-12-04 ENCOUNTER — Ambulatory Visit (HOSPITAL_COMMUNITY): Payer: Medicare Other

## 2019-01-01 ENCOUNTER — Ambulatory Visit (HOSPITAL_COMMUNITY)
Admission: RE | Admit: 2019-01-01 | Discharge: 2019-01-01 | Disposition: A | Discharge status: Home / Self Care | Payer: Medicare Other | Source: Ambulatory Visit | Attending: Family Medicine | Admitting: Family Medicine

## 2019-01-01 DIAGNOSIS — Z1231 Encounter for screening mammogram for malignant neoplasm of breast: Secondary | ICD-10-CM | POA: Diagnosis present

## 2019-03-25 ENCOUNTER — Telehealth: Payer: Self-pay | Admitting: Cardiology

## 2019-03-25 ENCOUNTER — Telehealth (INDEPENDENT_AMBULATORY_CARE_PROVIDER_SITE_OTHER): Payer: Medicare Other | Admitting: Cardiology

## 2019-03-25 ENCOUNTER — Encounter: Payer: Self-pay | Admitting: Cardiology

## 2019-03-25 ENCOUNTER — Other Ambulatory Visit: Payer: Self-pay

## 2019-03-25 VITALS — BP 139/70 | HR 98 | Ht 64.0 in | Wt 160.0 lb

## 2019-03-25 DIAGNOSIS — R002 Palpitations: Secondary | ICD-10-CM

## 2019-03-25 DIAGNOSIS — R0602 Shortness of breath: Secondary | ICD-10-CM

## 2019-03-25 NOTE — Progress Notes (Signed)
Medication Instructions:  Your physician recommends that you continue on your current medications as directed. Please refer to the Current Medication list given to you today.   Labwork: None   Testing/Procedures: Your physician has recommended that you wear an event monitor. Event monitors are medical devices that record the heart's electrical activity. Doctors most often [place] these monitors to diagnose arrhythmias. Arrhythmias are problems with the speed or rhythm of the heartbeat. The monitor is a small, portable device. You can wear one while you do your normal daily activities. This is usually used to diagnose what is causing palpitations/syncope (passing out). (7 days)     Follow-Up: Your physician recommends that you schedule a follow-up appointment in: pending monitor results    Any Other Special Instructions Will Be Listed Below (If Applicable).     If you need a refill on your cardiac medications before your next appointment, please call your pharmacy.

## 2019-03-25 NOTE — Progress Notes (Signed)
Virtual Visit via Telephone Note   This visit type was conducted due to national recommendations for restrictions regarding the COVID-19 Pandemic (e.g. social distancing) in an effort to limit this patient's exposure and mitigate transmission in our community.  Due to her co-morbid illnesses, this patient is at least at moderate risk for complications without adequate follow up.  This format is felt to be most appropriate for this patient at this time.  The patient did not have access to video technology/had technical difficulties with video requiring transitioning to audio format only (telephone).  All issues noted in this document were discussed and addressed.  No physical exam could be performed with this format.  Please refer to the patient's chart for her  consent to telehealth for [facility].   Date:  03/25/2019   ID:  Heather Barber, DOB 1946-09-06, MRN [id number]  Patient Location: Home Provider Location: Office  PCP:  Dione Housekeeper, MD  Cardiologist:  Carlyle Dolly, MD  Electrophysiologist:  None   Evaluation Performed:  Follow-Up Visit  Chief Complaint:  Palpitations  History of Present Illness:    Heather Barber is a 73 y.o. female seen today for follow up of the following medical problems. This is a focused visit on recent symptoms of palpitations, for more detailed history please refer to prior clinic notes.   1. Palpitations - recent symptoms since February when family member passed away - can occur at any time. Lasts 5-10 minutes. Lightheaded, SOB. - occurs daily.  - coffee x 2 cups, drinks mountain dew x1, occasional iced tea. No EtOH - compliant with meds    2. SOB/DOE - DOE with walking or house work - ongoing x 2 months - some right foot swelling. Weight down 15 lbs since January - no chest pain.   3. Ventricular tachycardia - remote history per notes, has had no recurrence - normal cath perviously, 1994 and 2011  4. Chronic LBBB - no  evidence of underlying heart disease by caths or echos     The patient does not have symptoms concerning for COVID-19 infection (fever, chills, cough, or new shortness of breath).    Past Medical History:  Diagnosis Date  . Atrial septal defect    Surgical repair in 1994  . Chest pain    Normal coronary angiography in 1994 and 2011  . Dizziness   . Essential thrombocythemia ([place]) 06/06/2016  . GERD (gastroesophageal reflux disease)   . Hyperlipidemia   . Hypertension   . LBBB (left bundle [name] block) 2011  . Palpitations   . Syncope   . Ventricular tachycardia ([place])    Exercise induced   Past Surgical History:  Procedure Laterality Date  . ASD [place] EXCISIONAL BIOPSY     Right and left  . CATARACT EXTRACTION W/PHACO Left 03/11/2013   Procedure: CATARACT EXTRACTION PHACO AND INTRAOCULAR LENS PLACEMENT (IOC);  Surgeon: [name], MD;  Location: [facility];  Service: Ophthalmology;  Laterality: Left;  CDE:  12.20  . TONSILLECTOMY    . TUBAL LIGATION     Bilateral     Current Meds  Medication Sig  . aspirin EC 81 MG tablet Take 81 mg by mouth daily.  . Fenofibric Acid 105 MG TABS Take 105 mg by mouth at bedtime.   . furosemide (LASIX) 20 MG tablet TAKE 1 TABLET (20 MG TOTAL) BY MOUTH DAILY.  Marland Kitchen losartan (COZAAR) 50 MG tablet TAKE 1  TABLET (50 MG TOTAL) BY MOUTH DAILY.  . metoprolol succinate (TOPROL-XL) 50 MG 24 hr tablet TAKE 1 TABLET (50 MG TOTAL) BY MOUTH DAILY. TAKE WITH OR IMMEDIATELY FOLLOWING A MEAL.  . naproxen sodium (ALEVE) 220 MG tablet Take 440 mg by mouth 2 (two) times daily as needed (pain/headache).  Marland Kitchen omeprazole (PRILOSEC) 20 MG capsule Take 20 mg by mouth daily.    . [DISCONTINUED] hydroxyurea (HYDREA) 500 MG capsule TAKE ONE CAPSULE TWICE A DAY . MAY TAKE WITH FOOD TO MINIMIZE GI SIDE EFFECTS  . [DISCONTINUED] loratadine (CLARITIN) 10 MG tablet Take 10 mg by mouth daily as needed for allergies.  . [DISCONTINUED]  Omega-3 Fatty Acids (FISH OIL) 1200 MG CAPS Take 1,200 mg by mouth daily.  . [DISCONTINUED] pravastatin (PRAVACHOL) 80 MG tablet TAKE 1 TABLET DAILY (Patient taking differently: TAKE 1 TABLET (80 MG) BY MOUTH  DAILY AT BEDTIME)     Allergies:   Codeine and Morphine   Social History   Tobacco Use  . Smoking status: Never Smoker  . Smokeless tobacco: Never Used  Substance Use Topics  . Alcohol use: No  . Drug use: No     Family Hx: The patient's family history includes Cancer in her mother and sister; Heart failure in her father; Stroke in her sister. There is no history of Coronary artery disease.  ROS:   Please see the history of present illness.     All other systems reviewed and are negative.   Prior CV studies:   The following studies were reviewed today:  01/2012 Echo Left ventricle: The cavity size was normal. Wall thickness was normal. Systolic function was mildly to moderately reduced. The estimated ejection fraction was in the range of 40% to 45%. - Ventricular septum: Septal motion showed paradox. - Aortic valve: Mildly calcified annulus. Trileaflet; mildly calcified leaflets. - Mitral valve: Mild to moderate regurgitation. - Left atrium: The atrium was mildly dilated. - Right ventricle: The cavity size was mildly dilated. Wall thickness was normal. - Right atrium: The atrium was mildly dilated. - Atrial septum: No defect or patent foramen ovale was identified. - Pulmonary arteries: PA peak pressure: 60mm Hg (S). - Pericardium, extracardiac: Minimal ascites was noted.  Labs/Other Tests and Data Reviewed:    EKG:  No ECG reviewed.  Recent Labs: 09/30/2018: ALT 25; BUN 12; Creatinine, Ser 0.65; Hemoglobin 12.7; Platelets 266; Potassium 3.7; Sodium 137   Recent Lipid Panel Lab Results  Component Value Date/Time   CHOL 238 (H) 12/08/2017 02:02 AM   TRIG 366 (H) 12/08/2017 02:02 AM   HDL 36 (L) 12/08/2017 02:02 AM   CHOLHDL 6.6 12/08/2017 02:02 AM    LDLCALC 129 (H) 12/08/2017 02:02 AM    Wt Readings from Last 3 Encounters:  03/25/19 160 lb (72.6 kg)  11/08/18 175 lb (79.4 kg)  09/30/18 177 lb 9.6 oz (80.6 kg)     Objective:    Vital Signs:  BP 139/70   Pulse 98   Ht 5\' 4"  (1.626 m)   Wt 160 lb (72.6 kg)   BMI 27.46 kg/m    Normal affect. Normal speech pattern and tone. Comfortable, no apparent distress. No audible signs of SOB or wheezing.   ASSESSMENT & PLAN:    1. Palpitations - we will plan a 7 day event monitor to further evaluate.  - wean caffeine intake - pending monitor results, if no clear arrhythmia etiology for her SOB is detected may consider repeat echo  COVID-19 Education: The signs and symptoms of COVID-19 were discussed with the patient and how to seek care for testing (follow up with PCP or arrange E-visit).  The importance of social distancing was discussed today.  Time:   Today, I have spent 13 minutes with the patient with telehealth technology discussing the above problems.     Medication Adjustments/Labs and Tests Ordered: Current medicines are reviewed at length with the patient today.  Concerns regarding medicines are outlined above.   Tests Ordered: No orders of the defined types were placed in this encounter.   Medication Changes: No orders of the defined types were placed in this encounter.   Disposition:  Follow up pending monitor results  Signed, Carlyle Dolly, MD  03/25/2019 1:47 PM    [facility]

## 2019-03-25 NOTE — Telephone Encounter (Signed)
Pt called requesting an apt because her heart isn't acting right, states she thinks her medicine may be messed up that her heart is beating really hard and she gives out of breath.   [phone]

## 2019-03-25 NOTE — Telephone Encounter (Signed)
Returned pt call. She would like a virtual visit to discuss issues with Dr. Harl Bowie.      Virtual Visit Pre-Appointment Phone Call  "(Name), I am calling you today to discuss your upcoming appointment. We are currently trying to limit exposure to the virus that causes COVID-19 by seeing patients at home rather than in the office."  1. "What is the BEST phone number to call the day of the visit?" - include this in appointment notes  2. "Do you have or have access to (through a family member/friend) a smartphone with video capability that we can use for your visit?" a. If yes - list this number in appt notes as "cell" (if different from BEST phone #) and list the appointment type as a VIDEO visit in appointment notes b. If no - list the appointment type as a PHONE visit in appointment notes  3. Confirm consent - "In the setting of the current Covid19 crisis, you are scheduled for a (phone or video) visit with your provider on (date) at (time).  Just as we do with many in-office visits, in order for you to participate in this visit, we must obtain consent.  If you'd like, I can send this to your mychart (if signed up) or email for you to review.  Otherwise, I can obtain your verbal consent now.  All virtual visits are billed to your insurance company just like a normal visit would be.  By agreeing to a virtual visit, we'd like you to understand that the technology does not allow for your provider to perform an examination, and thus may limit your provider's ability to fully assess your condition. If your provider identifies any concerns that need to be evaluated in person, we will make arrangements to do so.  Finally, though the technology is pretty good, we cannot assure that it will always work on either your or our end, and in the setting of a video visit, we may have to convert it to a phone-only visit.  In either situation, we cannot ensure that we have a secure connection.  Are you willing to  proceed?" STAFF: Did the patient verbally acknowledge consent to telehealth visit? Document YES/NO here: YES   4. Advise patient to be prepared - "Two hours prior to your appointment, go ahead and check your blood pressure, pulse, oxygen saturation, and your weight (if you have the equipment to check those) and write them all down. When your visit starts, your provider will ask you for this information. If you have an Apple Watch or Kardia device, please plan to have heart rate information ready on the day of your appointment. Please have a pen and paper handy nearby the day of the visit as well."  5. Give patient instructions for MyChart download to smartphone OR Doximity/Doxy.me as below if video visit (depending on what platform provider is using)  6. Inform patient they will receive a phone call 15 minutes prior to their appointment time (may be from unknown caller ID) so they should be prepared to answer    TELEPHONE CALL NOTE  Heather Barber has been deemed a candidate for a follow-up tele-health visit to limit community exposure during the Covid-19 pandemic. I spoke with the patient via phone to ensure availability of phone/video source, confirm preferred email & phone number, and discuss instructions and expectations.  I reminded Heather Barber to be prepared with any vital sign and/or heart rhythm information that could potentially be obtained via home  monitoring, at the time of her visit. I reminded Heather Barber to expect a phone call prior to her visit.  Drema Dallas, [facility] 03/25/2019 8:34 AM   INSTRUCTIONS FOR DOWNLOADING THE MYCHART APP TO SMARTPHONE  - The patient must first make sure to have activated MyChart and know their login information - If Apple, go to [employer] and type in MyChart in the search bar and download the app. If Android, ask patient to go to [employer] and type in [employer] in the search bar and download the app. The app is free but as with any other app  downloads, their phone may require them to verify saved payment information or Apple/Android password.  - The patient will need to then log into the app with their MyChart username and password, and select [facility] as their healthcare provider to link the account. When it is time for your visit, go to the MyChart app, find appointments, and click Begin Video Visit. Be sure to Select Allow for your device to access the Microphone and Camera for your visit. You will then be connected, and your provider will be with you shortly.  **If they have any issues connecting, or need assistance please contact MyChart service desk [phone])**  **If using a computer, in order to ensure the best quality for their visit they will need to use either of the following Internet Browsers: [employer], or Google Chrome**  IF USING DOXIMITY or DOXY.ME - The patient will receive a link just prior to their visit by text.     FULL LENGTH CONSENT FOR TELE-HEALTH VISIT   I hereby voluntarily request, consent and authorize [facility] and its employed or contracted physicians, physician assistants, nurse practitioners or other licensed health care professionals (the Practitioner), to provide me with telemedicine health care services (the "Services") as deemed necessary by the treating Practitioner. I acknowledge and consent to receive the Services by the Practitioner via telemedicine. I understand that the telemedicine visit will involve communicating with the Practitioner through live audiovisual communication technology and the disclosure of certain medical information by electronic transmission. I acknowledge that I have been given the opportunity to request an in-person assessment or other available alternative prior to the telemedicine visit and am voluntarily participating in the telemedicine visit.  I understand that I have the right to withhold or withdraw my consent to the use of telemedicine  in the course of my care at any time, without affecting my right to future care or treatment, and that the Practitioner or I may terminate the telemedicine visit at any time. I understand that I have the right to inspect all information obtained and/or recorded in the course of the telemedicine visit and may receive copies of available information for a reasonable fee.  I understand that some of the potential risks of receiving the Services via telemedicine include:  Marland Kitchen Delay or interruption in medical evaluation due to technological equipment failure or disruption; . Information transmitted may not be sufficient (e.g. poor resolution of images) to allow for appropriate medical decision making by the Practitioner; and/or  . In rare instances, security protocols could fail, causing a breach of personal health information.  Furthermore, I acknowledge that it is my responsibility to provide information about my medical history, conditions and care that is complete and accurate to the best of my ability. I acknowledge that Practitioner's advice, recommendations, and/or decision may be based on factors not within their control, such as incomplete  or inaccurate data provided by me or distortions of diagnostic images or specimens that may result from electronic transmissions. I understand that the practice of medicine is not an exact science and that Practitioner makes no warranties or guarantees regarding treatment outcomes. I acknowledge that I will receive a copy of this consent concurrently upon execution via email to the email address I last provided but may also request a printed copy by calling the office of [facility].    I understand that my insurance will be billed for this visit.   I have read or had this consent read to me. . I understand the contents of this consent, which adequately explains the benefits and risks of the Services being provided via telemedicine.  . I have been provided ample  opportunity to ask questions regarding this consent and the Services and have had my questions answered to my satisfaction. . I give my informed consent for the services to be provided through the use of telemedicine in my medical care  By participating in this telemedicine visit I agree to the above.

## 2019-03-28 ENCOUNTER — Ambulatory Visit (INDEPENDENT_AMBULATORY_CARE_PROVIDER_SITE_OTHER): Payer: Medicare Other

## 2019-03-28 DIAGNOSIS — R002 Palpitations: Secondary | ICD-10-CM | POA: Diagnosis not present

## 2019-04-04 ENCOUNTER — Ambulatory Visit (HOSPITAL_COMMUNITY): Payer: Medicare Other | Admitting: Hematology

## 2019-04-04 ENCOUNTER — Other Ambulatory Visit (HOSPITAL_COMMUNITY): Payer: Medicare Other

## 2019-04-08 ENCOUNTER — Inpatient Hospital Stay (HOSPITAL_BASED_OUTPATIENT_CLINIC_OR_DEPARTMENT_OTHER): Payer: Medicare Other | Admitting: Hematology

## 2019-04-08 ENCOUNTER — Other Ambulatory Visit: Payer: Self-pay

## 2019-04-08 ENCOUNTER — Inpatient Hospital Stay (HOSPITAL_COMMUNITY): Payer: Medicare Other | Attending: Hematology

## 2019-04-08 ENCOUNTER — Encounter (HOSPITAL_COMMUNITY): Payer: Self-pay | Admitting: Hematology

## 2019-04-08 DIAGNOSIS — I10 Essential (primary) hypertension: Secondary | ICD-10-CM | POA: Diagnosis not present

## 2019-04-08 DIAGNOSIS — D473 Essential (hemorrhagic) thrombocythemia: Secondary | ICD-10-CM

## 2019-04-08 DIAGNOSIS — Z79899 Other long term (current) drug therapy: Secondary | ICD-10-CM | POA: Diagnosis not present

## 2019-04-08 DIAGNOSIS — D75839 Thrombocytosis, unspecified: Secondary | ICD-10-CM

## 2019-04-08 LAB — COMPREHENSIVE METABOLIC PANEL
ALT: 20 U/L (ref 0–44)
AST: 15 U/L (ref 15–41)
Albumin: 4.3 g/dL (ref 3.5–5.0)
Alkaline Phosphatase: 88 U/L (ref 38–126)
Anion gap: 14 (ref 5–15)
BUN: 14 mg/dL (ref 8–23)
CO2: 26 mmol/L (ref 22–32)
Calcium: 9.4 mg/dL (ref 8.9–10.3)
Chloride: 101 mmol/L (ref 98–111)
Creatinine, Ser: 0.79 mg/dL (ref 0.44–1.00)
GFR calc Af Amer: >60 mL/min (ref >60)
GFR calc non Af Amer: >60 mL/min (ref >60)
Glucose, Bld: 109 mg/dL — ABNORMAL HIGH (ref 70–99)
Potassium: 4.1 mmol/L (ref 3.5–5.1)
Sodium: 141 mmol/L (ref 135–145)
Total Bilirubin: 0.5 mg/dL (ref 0.3–1.2)
Total Protein: 7.4 g/dL (ref 6.5–8.1)

## 2019-04-08 LAB — CBC WITH DIFFERENTIAL/PLATELET
Abs Immature Granulocytes: 0.03 10*3/uL (ref 0.00–0.07)
Basophils Absolute: 0 10*3/uL (ref 0.0–0.1)
Basophils Relative: 1 %
Eosinophils Absolute: 0.1 10*3/uL (ref 0.0–0.5)
Eosinophils Relative: 2 %
HCT: 35 % — ABNORMAL LOW (ref 36.0–46.0)
Hemoglobin: 12.4 g/dL (ref 12.0–15.0)
Immature Granulocytes: 1 %
Lymphocytes Relative: 35 %
Lymphs Abs: 2.3 10*3/uL (ref 0.7–4.0)
MCH: 41.1 pg — ABNORMAL HIGH (ref 26.0–34.0)
MCHC: 35.4 g/dL (ref 30.0–36.0)
MCV: 115.9 fL — ABNORMAL HIGH (ref 80.0–100.0)
Monocytes Absolute: 0.5 10*3/uL (ref 0.1–1.0)
Monocytes Relative: 7 %
Neutro Abs: 3.7 10*3/uL (ref 1.7–7.7)
Neutrophils Relative %: 54 %
Platelets: 288 10*3/uL (ref 150–400)
RBC: 3.02 MIL/uL — ABNORMAL LOW (ref 3.87–5.11)
RDW: 12.8 % (ref 11.5–15.5)
WBC: 6.6 10*3/uL (ref 4.0–10.5)
nRBC: 0 % (ref 0.0–0.2)

## 2019-04-08 NOTE — Assessment & Plan Note (Signed)
1.  [name]: -Did not have any prior history of thrombosis.  Denies any vasomotor symptoms. -Because of her advanced age, she was started on hydroxyurea.  She is currently taking hydroxyurea 500 mg twice daily. -She is continuing aspirin 81 mg daily. -She does have occasional nausea from hydroxyurea.  Denies any vomiting.  Denies any fevers or infections. - We discussed the blood work today.  Hematocrit is 35.  Platelet count is 288. Recommend continue Hydrea 500mg  BID.  Physical examination did not reveal any splenomegaly. -We will see her back in 6 months for follow-up with labs.

## 2019-04-08 NOTE — Progress Notes (Signed)
[name], [place] [address]   CLINIC:  Medical Oncology/Hematology  PCP:  Dione Housekeeper, MD [address] [place] [address] [phone]   REASON FOR VISIT:  Follow-up for Heather Barber  CURRENT THERAPY: Hydrea   BRIEF ONCOLOGIC HISTORY:    Barber ([place])   10/05/2015 Initial Diagnosis    Essential thrombocythemia ([place])    10/06/2015 -  Chemotherapy    Hydrea        INTERVAL HISTORY:  [name] 73 y.o. female presents today for follow up. Reports overall doing well. Denies any significant fatigue. Currently on Hydrea 500mg  BID, tolerating well. Denies any episodes of bleeding. No thrombus. No vasomotor symptoms. No fevers, chills, night sweats, or weight loss.    REVIEW OF SYSTEMS:  Review of Systems  Constitutional: Negative.   HENT:  Negative.   Eyes: Negative.   Respiratory: Negative.   Cardiovascular: Negative.   Gastrointestinal: Negative.   Endocrine: Negative.   Genitourinary: Negative.    Musculoskeletal: Negative.   Skin: Negative.   Neurological: Negative.   Hematological: Negative.   Psychiatric/Behavioral: Negative.      PAST MEDICAL/SURGICAL HISTORY:  Past Medical History:  Diagnosis Date  . Atrial septal defect    Surgical repair in 1994  . Chest pain    Normal coronary angiography in 1994 and 2011  . Dizziness   . Essential thrombocythemia (Heather Barber) 06/06/2016  . GERD (gastroesophageal reflux disease)   . Hyperlipidemia   . Hypertension   . LBBB (left bundle branch block) 2011  . Palpitations   . Syncope   . Ventricular tachycardia ([place])    Exercise induced   Past Surgical History:  Procedure Laterality Date  . ASD [place] EXCISIONAL BIOPSY     Right and left  . CATARACT EXTRACTION W/PHACO Left 03/11/2013   Procedure: CATARACT EXTRACTION PHACO AND INTRAOCULAR LENS PLACEMENT (IOC);  Surgeon: [name], MD;  Location:  [facility];  Service: Ophthalmology;  Laterality: Left;  CDE:  12.20  . TONSILLECTOMY    . TUBAL LIGATION     Bilateral     SOCIAL HISTORY:  Social History   Socioeconomic History  . Marital status: Married    Spouse name: Not on file  . Number of children: Not on file  . Years of education: Not on file  . Highest education level: Not on file  Occupational History  . Occupation: Psychologist, prison and probation services  Social Needs  . Financial resource strain: Not on file  . Food insecurity:    Worry: Not on file    Inability: Not on file  . Transportation needs:    Medical: Not on file    Non-medical: Not on file  Tobacco Use  . Smoking status: Never Smoker  . Smokeless tobacco: Never Used  Substance and Sexual Activity  . Alcohol use: No  . Drug use: No  . Sexual activity: Never    Birth control/protection: Post-menopausal  Lifestyle  . Physical activity:    Days per week: Not on file    Minutes per session: Not on file  . Stress: Not on file  Relationships  . Social connections:    Talks on phone: Not on file    Gets together: Not on file    Attends religious service: Not on file    Active member of club or organization: Not on file    Attends meetings of clubs  or organizations: Not on file    Relationship status: Not on file  . Intimate partner violence:    Fear of current or ex partner: Not on file    Emotionally abused: Not on file    Physically abused: Not on file    Forced sexual activity: Not on file  Other Topics Concern  . Not on file  Social History Narrative   Married and resides in [place] with husband and 3 children   Sedentary lifestyle    FAMILY HISTORY:  Family History  Problem Relation Age of Onset  . Cancer Mother   . Stroke Sister   . Cancer Sister   . Heart failure Father   . Coronary artery disease Neg Hx     CURRENT MEDICATIONS:  Outpatient Encounter Medications as of 04/08/2019  Medication Sig  . aspirin EC 81 MG tablet Take 81 mg by mouth  daily.  . Fenofibric Acid 105 MG TABS Take 105 mg by mouth at bedtime.   . furosemide (LASIX) 20 MG tablet TAKE 1 TABLET (20 MG TOTAL) BY MOUTH DAILY.  Marland Kitchen losartan (COZAAR) 50 MG tablet TAKE 1 TABLET (50 MG TOTAL) BY MOUTH DAILY.  . metoprolol succinate (TOPROL-XL) 50 MG 24 hr tablet TAKE 1 TABLET (50 MG TOTAL) BY MOUTH DAILY. TAKE WITH OR IMMEDIATELY FOLLOWING A MEAL.  . naproxen sodium (ALEVE) 220 MG tablet Take 440 mg by mouth 2 (two) times daily as needed (pain/headache).  Marland Kitchen omeprazole (PRILOSEC) 20 MG capsule Take 20 mg by mouth daily.     No facility-administered encounter medications on file as of 04/08/2019.     ALLERGIES:  Allergies  Allergen Reactions  . Codeine Nausea And Vomiting  . Morphine Other (See Comments)    GI symptoms     PHYSICAL EXAM:  ECOG Performance status: 1  Vitals:   04/08/19 1300  BP: 129/64  Pulse: 80  Resp: 16  Temp: 98.1 F (36.7 C)  SpO2: 100%   Filed Weights   04/08/19 1300  Weight: 166 lb (75.3 kg)    Physical Exam Constitutional:      Appearance: Normal appearance.  HENT:     Head: Normocephalic.     Nose: Nose normal.     Mouth/Throat:     Mouth: Mucous membranes are dry.  Eyes:     Extraocular Movements: Extraocular movements intact.     Conjunctiva/sclera: Conjunctivae normal.  Neck:     Musculoskeletal: Normal range of motion.  Cardiovascular:     Rate and Rhythm: Normal rate and regular rhythm.     Pulses: Normal pulses.     Heart sounds: Normal heart sounds.  Pulmonary:     Effort: Pulmonary effort is normal.     Breath sounds: Normal breath sounds.  Abdominal:     General: Bowel sounds are normal.     Palpations: Abdomen is soft.  Musculoskeletal: Normal range of motion.  Skin:    General: Skin is warm and dry.  Neurological:     General: No focal deficit present.     Mental Status: She is alert and oriented to person, place, and time.  Psychiatric:        Mood and Affect: Mood normal.        Behavior:  Behavior normal.        Thought Content: Thought content normal.        Judgment: Judgment normal.      LABORATORY DATA:  I have reviewed the labs as listed.  CBC  Component Value Date/Time   WBC 6.6 04/08/2019 1320   RBC 3.02 (L) 04/08/2019 1320   HGB 12.4 04/08/2019 1320   HCT 35.0 (L) 04/08/2019 1320   PLT 288 04/08/2019 1320   MCV 115.9 (H) 04/08/2019 1320   MCH 41.1 (H) 04/08/2019 1320   MCHC 35.4 04/08/2019 1320   RDW 12.8 04/08/2019 1320   LYMPHSABS 2.3 04/08/2019 1320   MONOABS 0.5 04/08/2019 1320   EOSABS 0.1 04/08/2019 1320   BASOSABS 0.0 04/08/2019 1320   CMP Latest Ref Rng & Units 04/08/2019 09/30/2018 03/21/2018  Glucose 70 - 99 mg/dL 109(H) 150(H) 112(H)  BUN 8 - 23 mg/dL 14 12 23(H)  Creatinine 0.44 - 1.00 mg/dL 0.79 0.65 0.83  Sodium 135 - 145 mmol/L 141 137 140  Potassium 3.5 - 5.1 mmol/L 4.1 3.7 4.4  Chloride 98 - 111 mmol/L 101 100 101  CO2 22 - 32 mmol/L 26 26 29   Calcium 8.9 - 10.3 mg/dL 9.4 9.1 9.7  Total Protein 6.5 - 8.1 g/dL 7.4 7.3 7.5  Total Bilirubin 0.3 - 1.2 mg/dL 0.5 0.7 0.7  Alkaline Phos 38 - 126 U/L 88 87 91  AST 15 - 41 U/L 15 29 27   ALT 0 - 44 U/L 20 25 24        ASSESSMENT & PLAN:   Barber ([name]) 1.  Heather Barber: -Did not have any prior history of thrombosis.  Denies any vasomotor symptoms. -Because of her advanced age, she was started on hydroxyurea.  She is currently taking hydroxyurea 500 mg twice daily. -She is continuing aspirin 81 mg daily. -She does have occasional nausea from hydroxyurea.  Denies any vomiting.  Denies any fevers or infections. - We discussed the blood work today.  Hematocrit is 35.  Platelet count is 288. Recommend continue Hydrea 500mg  BID.  Physical examination did not reveal any splenomegaly. -We will see her back in 6 months for follow-up with labs.      Orders placed this encounter:  Orders Placed This Encounter  Procedures  . CBC with Differential  . Comprehensive  metabolic panel      Roger Shelter, [place] [phone]

## 2019-04-11 ENCOUNTER — Telehealth: Payer: Self-pay | Admitting: Cardiology

## 2019-04-11 ENCOUNTER — Other Ambulatory Visit: Payer: Self-pay

## 2019-04-11 NOTE — Telephone Encounter (Signed)
Monitor down loaded, will call pt when resulted

## 2019-04-11 NOTE — Telephone Encounter (Signed)
Patient called requesting results of recent heart monitor.  Please call [phone].

## 2019-04-29 ENCOUNTER — Telehealth: Payer: Self-pay

## 2019-04-29 NOTE — Telephone Encounter (Signed)
Called pt. No answer. Left message for pt to return call.  

## 2019-04-29 NOTE — Telephone Encounter (Signed)
-----   Message from [name], MD sent at 04/29/2019 12:51 PM EDT ----- Heart monitor does show some episodes of high heart rates. She has an abnormal heart rhythm at times called an SVT, which is not dangerous but can cause the heart rate to increase and cause her symptoms. Id like to have her increase her toprol to 50mg  bid to see if helps with symptoms, f/u with PA 2 weeks  Zandra Abts MD

## 2019-04-30 MED ORDER — METOPROLOL SUCCINATE ER 50 MG PO TB24: 50.0000 mg | ORAL_TABLET | Freq: Two times a day (BID) | ORAL | 3 refills | Status: DC

## 2019-04-30 NOTE — Addendum Note (Signed)
Addended by: Merlene Laughter on: 04/30/2019 11:38 AM   Modules accepted: Orders

## 2019-04-30 NOTE — Telephone Encounter (Signed)
Patient informed and verbalized understanding of plan. 

## 2019-05-19 ENCOUNTER — Other Ambulatory Visit (HOSPITAL_COMMUNITY): Payer: Self-pay | Admitting: Hematology

## 2019-05-19 DIAGNOSIS — D473 Essential (hemorrhagic) thrombocythemia: Secondary | ICD-10-CM

## 2019-05-21 DIAGNOSIS — I471 Supraventricular tachycardia, unspecified: Secondary | ICD-10-CM | POA: Insufficient documentation

## 2019-05-21 NOTE — Progress Notes (Signed)
Virtual Visit via Telephone Note   This visit type was conducted due to national recommendations for restrictions regarding the COVID-19 Pandemic (e.g. social distancing) in an effort to limit this patient's exposure and mitigate transmission in our community.  Due to her co-morbid illnesses, this patient is at least at moderate risk for complications without adequate follow up.  This format is felt to be most appropriate for this patient at this time.  The patient did not have access to video technology/had technical difficulties with video requiring transitioning to audio format only (telephone).  All issues noted in this document were discussed and addressed.  No physical exam could be performed with this format.  Please refer to the patient's chart for her  consent to telehealth for [facility].   Date:  05/26/2019   ID:  Heather Barber, DOB May 01, 1946, MRN [id number]  Patient Location: Home Provider Location: Home  PCP:  Dione Housekeeper, MD  Cardiologist:  Carlyle Dolly, MD   Electrophysiologist:  None   Evaluation Performed:  Follow-Up Visit  Chief Complaint:   f/u  History of Present Illness:    Heather Barber is a 73 y.o. female with history of ASD repair in her 30s, ventricular tachycardia with normal cardiac cath in 1994 and 2011, chronic left bundle branch block, hyperlipidemia, thrombocythemia.  Last echo 11/2017 normal LVEF 50 to 55% with moderate MR.  LV function improved.  Patient had telemedicine visit with Dr. Harl Bowie 03/25/2019 at which time she was complaining of palpitations and dyspnea on exertion for 2 months.  Holter monitor showed SVT Dr. Harl Bowie increased her Toprol to 50 mg twice daily. Patient says her heart isn't beating hard since it was increased. HR between 80-85. Denies chest pain, shortness of breath. BP and P up initially because she was painting her house right before she took her BP. BP has been ok when she checks it.   The patient does not have  symptoms concerning for COVID-19 infection (fever, chills, cough, or new shortness of breath).    Past Medical History:  Diagnosis Date  . Atrial septal defect    Surgical repair in 1994  . Chest pain    Normal coronary angiography in 1994 and 2011  . Dizziness   . Essential thrombocythemia ([place]) 06/06/2016  . GERD (gastroesophageal reflux disease)   . Hyperlipidemia   . Hypertension   . LBBB (left bundle branch block) 2011  . Palpitations   . Syncope   . Ventricular tachycardia ([place])    Exercise induced   Past Surgical History:  Procedure Laterality Date  . ASD [place] EXCISIONAL BIOPSY     Right and left  . CATARACT EXTRACTION W/PHACO Left 03/11/2013   Procedure: CATARACT EXTRACTION PHACO AND INTRAOCULAR LENS PLACEMENT (IOC);  Surgeon: [name], MD;  Location: [facility];  Service: Ophthalmology;  Laterality: Left;  CDE:  12.20  . TONSILLECTOMY    . TUBAL LIGATION     Bilateral     Current Meds  Medication Sig  . aspirin EC 81 MG tablet Take 81 mg by mouth daily.  . Fenofibric Acid 105 MG TABS Take 105 mg by mouth at bedtime.   . furosemide (LASIX) 20 MG tablet TAKE 1 TABLET (20 MG TOTAL) BY MOUTH DAILY.  . hydroxyurea (HYDREA) 500 MG capsule Take 500 mg by mouth daily. May take with food to minimize GI side effects.  Marland Kitchen LORazepam (ATIVAN) 0.5 MG tablet  Take 0.5 mg by mouth every 8 (eight) hours.  Marland Kitchen losartan (COZAAR) 50 MG tablet TAKE 1 TABLET (50 MG TOTAL) BY MOUTH DAILY.  . metoprolol succinate (TOPROL-XL) 50 MG 24 hr tablet Take 1 tablet (50 mg total) by mouth 2 (two) times a day. Take with or immediately following a meal.  . naproxen sodium (ALEVE) 220 MG tablet Take 440 mg by mouth 2 (two) times daily as needed (pain/headache).  Marland Kitchen omeprazole (PRILOSEC) 20 MG capsule Take 20 mg by mouth daily.    . [DISCONTINUED] hydroxyurea (HYDREA) 500 MG capsule TAKE ONE CAPSULE TWICE A DAY . MAY TAKE WITH FOOD TO MINIMIZE GI SIDE EFFECTS      Allergies:   Codeine and Morphine   Social History   Tobacco Use  . Smoking status: Never Smoker  . Smokeless tobacco: Never Used  Substance Use Topics  . Alcohol use: No  . Drug use: No     Family Hx: The patient's family history includes Cancer in her mother and sister; Heart failure in her father; Stroke in her sister. There is no history of Coronary artery disease.  ROS:   Please see the history of present illness.      All other systems reviewed and are negative.   Prior CV studies:   The following studies were reviewed today:  Echo with bubble study 2/2019Study Conclusions   - Left ventricle: Abnormal septal motion Systolic function was   normal. The estimated ejection fraction was in the range of 50%   to 55%. - Mitral valve: Restricted posterior leaflet motion. Severely   calcified annulus. Moderately thickened leaflets . There was   moderate regurgitation. - Left atrium: The atrium was moderately dilated. - Atrial septum: No defect or patent foramen ovale was identified.   Echo contrast study showed no right-to-left atrial level shunt,   at baseline or with provocation.   7-day monitor 6/30/20207 day monitor  Min HR 65, Max HR 169, Avg HR 96  Rare ventricular ectopy all as isolated PVCs  Occasional supraventricular ectopy in the form of PACs, couplets. Fairly frequent runs of SVT up to 160 bpm  No symptoms reported     Labs/Other Tests and Data Reviewed:    EKG:  holter monitor reviewed as above  Recent Labs: 04/08/2019: ALT 20; BUN 14; Creatinine, Ser 0.79; Hemoglobin 12.4; Platelets 288; Potassium 4.1; Sodium 141   Recent Lipid Panel Lab Results  Component Value Date/Time   CHOL 238 (H) 12/08/2017 02:02 AM   TRIG 366 (H) 12/08/2017 02:02 AM   HDL 36 (L) 12/08/2017 02:02 AM   CHOLHDL 6.6 12/08/2017 02:02 AM   LDLCALC 129 (H) 12/08/2017 02:02 AM    Wt Readings from Last 3 Encounters:  05/26/19 166 lb (75.3 kg)  04/08/19 166 lb (75.3 kg)   03/25/19 160 lb (72.6 kg)     Objective:    Vital Signs:  BP (!) 166/86   Pulse 92   Ht 5\' 4"  (1.626 m)   Wt 166 lb (75.3 kg)   BMI 28.49 kg/m    VITAL SIGNS:  reviewed  ASSESSMENT & PLAN:    1. SVT up to 160 bpm with frequent runs on recent Holter monitor 04/29/2019 treated with increased Toprol-doing much better 2. History of ventricular tachycardia remote with normal cardiac cath in 1994 in 2011 3. History of ASD repair 4. Essential hypertension BP and Pulse up today but normally 130/80 and pulse in 80's 5. Hyperlipidemia LDL 129 11/2017 6. Chronic LBBB  COVID-19 Education: The signs and symptoms of COVID-19 were discussed with the patient and how to seek care for testing (follow up with PCP or arrange E-visit).   The importance of social distancing was discussed today.  Time:   Today, I have spent 10  minutes with the patient with telehealth technology discussing the above problems.     Medication Adjustments/Labs and Tests Ordered: Current medicines are reviewed at length with the patient today.  Concerns regarding medicines are outlined above.   Tests Ordered: No orders of the defined types were placed in this encounter.   Medication Changes: No orders of the defined types were placed in this encounter.   Follow Up:  In Person in 4 month(s) Dr. Harl Bowie  Signed, Ermalinda Barrios, PA-C  05/26/2019 11:11 AM    [place]

## 2019-05-26 ENCOUNTER — Telehealth (INDEPENDENT_AMBULATORY_CARE_PROVIDER_SITE_OTHER): Payer: Medicare Other | Admitting: Physician Assistant

## 2019-05-26 ENCOUNTER — Encounter: Payer: Self-pay | Admitting: Physician Assistant

## 2019-05-26 VITALS — BP 150/89 | HR 92 | Ht 64.0 in | Wt 166.0 lb

## 2019-05-26 DIAGNOSIS — I472 Ventricular tachycardia, unspecified: Secondary | ICD-10-CM

## 2019-05-26 DIAGNOSIS — I471 Supraventricular tachycardia, unspecified: Secondary | ICD-10-CM

## 2019-05-26 DIAGNOSIS — E782 Mixed hyperlipidemia: Secondary | ICD-10-CM

## 2019-05-26 DIAGNOSIS — Q211 Atrial septal defect, unspecified: Secondary | ICD-10-CM

## 2019-05-26 DIAGNOSIS — I10 Essential (primary) hypertension: Secondary | ICD-10-CM

## 2019-05-26 DIAGNOSIS — I447 Left bundle-branch block, unspecified: Secondary | ICD-10-CM

## 2019-05-26 NOTE — Patient Instructions (Signed)
Medication Instructions:  Your physician recommends that you continue on your current medications as directed. Please refer to the Current Medication list given to you today.  If you need a refill on your cardiac medications before your next appointment, please call your pharmacy.   Lab work: NONE  If you have labs (blood work) drawn today and your tests are completely normal, you will receive your results only by: . MyChart Message (if you have MyChart) OR . A paper copy in the mail If you have any lab test that is abnormal or we need to change your treatment, we will call you to review the results.  Testing/Procedures: NONE   Follow-Up: At [facility], you and your health needs are our priority.  As part of our continuing mission to provide you with exceptional heart care, we have created designated Provider Care Teams.  These Care Teams include your primary Cardiologist (physician) and Advanced Practice Providers (APPs -  Physician Assistants and Nurse Practitioners) who all work together to provide you with the care you need, when you need it. You will need a follow up appointment in 4 months.  Please call our office 2 months in advance to schedule this appointment.  You may see Branch, Jonathan, MD or one of the following Advanced Practice Providers on your designated Care Team:   Brittany Strader, PA-C ([place] Office) . Michele Lenze, PA-C ([place] Office)  Any Other Special Instructions Will Be Listed Below (If Applicable). Thank you for choosing [facility]!     

## 2019-06-30 ENCOUNTER — Other Ambulatory Visit: Payer: Self-pay | Admitting: Cardiology

## 2019-06-30 MED ORDER — FUROSEMIDE 20 MG PO TABS: ORAL_TABLET | ORAL | 1 refills | Status: DC

## 2019-06-30 NOTE — Telephone Encounter (Signed)
Medication sent to pharmacy  

## 2019-07-17 ENCOUNTER — Other Ambulatory Visit: Payer: Self-pay

## 2019-07-18 ENCOUNTER — Ambulatory Visit (INDEPENDENT_AMBULATORY_CARE_PROVIDER_SITE_OTHER): Payer: Medicare Other | Admitting: Family Medicine

## 2019-07-18 ENCOUNTER — Encounter: Payer: Self-pay | Admitting: Family Medicine

## 2019-07-18 VITALS — BP 124/74 | HR 91 | Temp 98.0°F | Resp 16 | Ht 64.0 in | Wt 166.8 lb

## 2019-07-18 DIAGNOSIS — E782 Mixed hyperlipidemia: Secondary | ICD-10-CM

## 2019-07-18 DIAGNOSIS — Z23 Encounter for immunization: Secondary | ICD-10-CM

## 2019-07-18 DIAGNOSIS — I10 Essential (primary) hypertension: Secondary | ICD-10-CM | POA: Diagnosis not present

## 2019-07-18 DIAGNOSIS — Z1159 Encounter for screening for other viral diseases: Secondary | ICD-10-CM

## 2019-07-18 DIAGNOSIS — E1169 Type 2 diabetes mellitus with other specified complication: Secondary | ICD-10-CM

## 2019-07-18 DIAGNOSIS — K219 Gastro-esophageal reflux disease without esophagitis: Secondary | ICD-10-CM

## 2019-07-18 HISTORY — DX: Type 2 diabetes mellitus with other specified complication: E11.69

## 2019-07-18 LAB — BAYER DCA HB A1C WAIVED: HB A1C (BAYER DCA - WAIVED): 6.2 % (ref <7.0)

## 2019-07-18 MED ORDER — OMEPRAZOLE 20 MG PO CPDR: 20.0000 mg | DELAYED_RELEASE_CAPSULE | Freq: Every day | ORAL | 2 refills | Status: DC

## 2019-07-18 MED ORDER — LOSARTAN POTASSIUM 50 MG PO TABS: 50.0000 mg | ORAL_TABLET | Freq: Every day | ORAL | 3 refills | Status: DC

## 2019-07-18 NOTE — Progress Notes (Signed)
BP 124/74   Pulse 91   Temp 98 F (36.7 C) (Temporal)   Resp 16   Ht '5\' 4"'  (1.626 m)   Wt 166 lb 12.8 oz (75.7 kg)   SpO2 96%   BMI 28.63 kg/m    Subjective:    [name], female    DOB: [birth year], 73 y.o.   MRN: [id number]  HPI: Heather Barber is a 73 y.o. female presenting on 07/18/2019 for New Patient (Initial Visit)   HPI Type 2 diabetes mellitus Patient comes in as a new patient to our office today from Dr. Murrell Redden office.  Patient comes in today for recheck of his diabetes. Patient has been currently taking diet controlled it looks like her last A1c in March was 6.6. Patient is currently on an ACE inhibitor/ARB. Patient has not seen an ophthalmologist this year. Patient denies any issues with their feet.   Hypertension Patient is currently on losartan and metoprolol, and their blood pressure today is 124/74. Patient denies any lightheadedness or dizziness. Patient denies headaches, blurred vision, chest pains, shortness of breath, or weakness. Denies any side effects from medication and is content with current medication.   Hyperlipidemia Patient is coming in for recheck of his hyperlipidemia. The patient is currently taking no medication currently will monitor, will discuss in the future. They deny any issues with myalgias or history of liver damage from it. They deny any focal numbness or weakness or chest pain.   GERD Patient is currently on omeprazole.  She denies any major symptoms or abdominal pain or belching or burping. She denies any blood in her stool or lightheadedness or dizziness.   Relevant past medical, surgical, family and social history reviewed and updated as indicated. Interim medical history since our last visit reviewed. Allergies and medications reviewed and updated.  Review of Systems  Constitutional: Negative for chills and fever.  HENT: Negative for congestion, ear discharge and ear pain.   Eyes: Negative for redness and visual  disturbance.  Respiratory: Negative for chest tightness and shortness of breath.   Cardiovascular: Negative for chest pain and leg swelling.  Genitourinary: Negative for difficulty urinating and dysuria.  Musculoskeletal: Negative for back pain and gait problem.  Skin: Negative for rash.  Neurological: Negative for light-headedness and headaches.  Psychiatric/Behavioral: Negative for agitation and behavioral problems.  All other systems reviewed and are negative.   Per HPI unless specifically indicated above  Social History   Socioeconomic History  . Marital status: Widowed    Spouse name: widowed  . Number of children: 3  . Years of education: Not on file  . Highest education level: Not on file  Occupational History  . Occupation: Psychologist, prison and probation services  Social Needs  . Financial resource strain: Not on file  . Food insecurity    Worry: Not on file    Inability: Not on file  . Transportation needs    Medical: Not on file    Non-medical: Not on file  Tobacco Use  . Smoking status: Never Smoker  . Smokeless tobacco: Never Used  Substance and Sexual Activity  . Alcohol use: No  . Drug use: No  . Sexual activity: Never    Birth control/protection: Post-menopausal  Lifestyle  . Physical activity    Days per week: Not on file    Minutes per session: Not on file  . Stress: Not on file  Relationships  . Social connections    Talks on phone: Not on  file    Gets together: Not on file    Attends religious service: Not on file    Active member of club or organization: Not on file    Attends meetings of clubs or organizations: Not on file    Relationship status: Not on file  . Intimate partner violence    Fear of current or ex partner: Not on file    Emotionally abused: Not on file    Physically abused: Not on file    Forced sexual activity: Not on file  Other Topics Concern  . Not on file  Social History Narrative   Married and resides in [place] with husband and 3  children   Sedentary lifestyle    Past Surgical History:  Procedure Laterality Date  . ASD [place] EXCISIONAL BIOPSY     Right and left  . CATARACT EXTRACTION W/PHACO Left 03/11/2013   Procedure: CATARACT EXTRACTION PHACO AND INTRAOCULAR LENS PLACEMENT (IOC);  Surgeon: [name], MD;  Location: [facility];  Service: Ophthalmology;  Laterality: Left;  CDE:  12.20  . TONSILLECTOMY    . TUBAL LIGATION     Bilateral    Family History  Problem Relation Age of Onset  . Cancer Mother        female  . Stroke Sister   . Cancer Sister        breast  . Heart failure Father   . Coronary artery disease Neg Hx     Allergies as of 07/18/2019      Reactions   Codeine Nausea And Vomiting   Morphine Other (See Comments)   GI symptoms      Medication List       Accurate as of July 18, 2019 10:19 AM. If you have any questions, ask your nurse or doctor.        STOP taking these medications   Fenofibric Acid 105 MG Tabs Stopped by: Fransisca Kaufmann Verena Shawgo, MD     TAKE these medications   aspirin EC 81 MG tablet Take 81 mg by mouth daily.   furosemide 20 MG tablet Commonly known as: LASIX TAKE 1 TABLET (20 MG TOTAL) BY MOUTH DAILY.   hydroxyurea 500 MG capsule Commonly known as: HYDREA Take 500 mg by mouth daily. May take with food to minimize GI side effects.   LORazepam 0.5 MG tablet Commonly known as: ATIVAN Take 0.5 mg by mouth every 8 (eight) hours.   losartan 50 MG tablet Commonly known as: COZAAR Take 1 tablet (50 mg total) by mouth daily.   metoprolol succinate 50 MG 24 hr tablet Commonly known as: TOPROL-XL Take 1 tablet (50 mg total) by mouth 2 (two) times a day. Take with or immediately following a meal.   naproxen sodium 220 MG tablet Commonly known as: ALEVE Take 440 mg by mouth 2 (two) times daily as needed (pain/headache).   omeprazole 20 MG capsule Commonly known as: PRILOSEC Take 1 capsule (20 mg total) by  mouth daily.          Objective:    BP 124/74   Pulse 91   Temp 98 F (36.7 C) (Temporal)   Resp 16   Ht '5\' 4"'  (1.626 m)   Wt 166 lb 12.8 oz (75.7 kg)   SpO2 96%   BMI 28.63 kg/m   Wt Readings from Last 3 Encounters:  07/18/19 166 lb 12.8 oz (75.7 kg)  05/26/19 166 lb (75.3  kg)  04/08/19 166 lb (75.3 kg)    Physical Exam Vitals signs and nursing note reviewed.  Constitutional:      General: She is not in acute distress.    Appearance: She is well-developed. She is not diaphoretic.  Eyes:     Conjunctiva/sclera: Conjunctivae normal.  Cardiovascular:     Rate and Rhythm: Normal rate and regular rhythm.     Heart sounds: Normal heart sounds. No murmur.  Pulmonary:     Effort: Pulmonary effort is normal. No respiratory distress.     Breath sounds: Normal breath sounds. No wheezing.  Musculoskeletal: Normal range of motion.        General: No tenderness.  Skin:    General: Skin is warm and dry.     Findings: No rash.  Neurological:     Mental Status: She is alert and oriented to person, place, and time.     Coordination: Coordination normal.  Psychiatric:        Behavior: Behavior normal.         Assessment & Plan:   Problem List Items Addressed This Visit      Cardiovascular and Mediastinum   Hypertension - Primary   Relevant Medications   losartan (COZAAR) 50 MG tablet   Other Relevant Orders   CMP14+EGFR     Digestive   Gastroesophageal reflux disease   Relevant Medications   omeprazole (PRILOSEC) 20 MG capsule   Other Relevant Orders   CBC with Differential/Platelet     Endocrine   Type 2 diabetes mellitus with other specified complication ([name])   Relevant Medications   losartan (COZAAR) 50 MG tablet   Other Relevant Orders   Bayer DCA Hb A1c Waived   CMP14+EGFR     Other   Hyperlipidemia   Relevant Medications   losartan (COZAAR) 50 MG tablet   Other Relevant Orders   Lipid panel    Other Visit Diagnoses    Need for hepatitis C  screening test       Relevant Orders   Hepatitis C antibody    Will continue current medication, no changes, looks like she is doing good blood pressure and will continue diet control will check blood work today and see where its at.  Return in 6 months for physical  Follow up plan: Return in about 6 months (around 01/15/2020), or if symptoms worsen or fail to improve, for Physical and htn and dm.  Caryl Pina, MD [facility] 07/18/2019, 10:19 AM

## 2019-07-19 LAB — CMP14+EGFR
ALT: 15 IU/L (ref 0–32)
AST: 11 IU/L (ref 0–40)
Albumin/Globulin Ratio: 2 (ref 1.2–2.2)
Albumin: 4.5 g/dL (ref 3.7–4.7)
Alkaline Phosphatase: 113 IU/L (ref 39–117)
BUN/Creatinine Ratio: 17 (ref 12–28)
BUN: 13 mg/dL (ref 8–27)
Bilirubin Total: 0.3 mg/dL (ref 0.0–1.2)
CO2: 25 mmol/L (ref 20–29)
Calcium: 10.3 mg/dL (ref 8.7–10.3)
Chloride: 100 mmol/L (ref 96–106)
Creatinine, Ser: 0.75 mg/dL (ref 0.57–1.00)
GFR calc Af Amer: 91 mL/min/{1.73_m2} (ref >59)
GFR calc non Af Amer: 79 mL/min/{1.73_m2} (ref >59)
Globulin, Total: 2.3 g/dL (ref 1.5–4.5)
Glucose: 98 mg/dL (ref 65–99)
Potassium: 4.8 mmol/L (ref 3.5–5.2)
Sodium: 140 mmol/L (ref 134–144)
Total Protein: 6.8 g/dL (ref 6.0–8.5)

## 2019-07-19 LAB — CBC WITH DIFFERENTIAL/PLATELET
Basophils Absolute: 0.1 10*3/uL (ref 0.0–0.2)
Basos: 1 %
EOS (ABSOLUTE): 0.1 10*3/uL (ref 0.0–0.4)
Eos: 2 %
Hematocrit: 36.8 % (ref 34.0–46.6)
Hemoglobin: 13.5 g/dL (ref 11.1–15.9)
Immature Grans (Abs): 0 10*3/uL (ref 0.0–0.1)
Immature Granulocytes: 1 %
Lymphocytes Absolute: 2.2 10*3/uL (ref 0.7–3.1)
Lymphs: 40 %
MCH: 40.9 pg — ABNORMAL HIGH (ref 26.6–33.0)
MCHC: 36.7 g/dL — ABNORMAL HIGH (ref 31.5–35.7)
MCV: 112 fL — ABNORMAL HIGH (ref 79–97)
Monocytes Absolute: 0.5 10*3/uL (ref 0.1–0.9)
Monocytes: 9 %
Neutrophils Absolute: 2.7 10*3/uL (ref 1.4–7.0)
Neutrophils: 47 %
Platelets: 290 10*3/uL (ref 150–450)
RBC: 3.3 x10E6/uL — ABNORMAL LOW (ref 3.77–5.28)
RDW: 11.9 % (ref 11.7–15.4)
WBC: 5.6 10*3/uL (ref 3.4–10.8)

## 2019-07-19 LAB — HEPATITIS C ANTIBODY: Hep C Virus Ab: <0.1 s/co ratio (ref 0.0–0.9)

## 2019-07-19 LAB — LIPID PANEL
Chol/HDL Ratio: 6.7 ratio — ABNORMAL HIGH (ref 0.0–4.4)
Cholesterol, Total: 261 mg/dL — ABNORMAL HIGH (ref 100–199)
HDL: 39 mg/dL — ABNORMAL LOW (ref >39)
LDL Chol Calc (NIH): 136 mg/dL — ABNORMAL HIGH (ref 0–99)
Triglycerides: 468 mg/dL — ABNORMAL HIGH (ref 0–149)
VLDL Cholesterol Cal: 86 mg/dL — ABNORMAL HIGH (ref 5–40)

## 2019-07-22 ENCOUNTER — Other Ambulatory Visit: Payer: Self-pay | Admitting: *Deleted

## 2019-07-22 MED ORDER — ATORVASTATIN CALCIUM 20 MG PO TABS: 20.0000 mg | ORAL_TABLET | Freq: Every day | ORAL | 1 refills | Status: DC

## 2019-08-29 ENCOUNTER — Other Ambulatory Visit: Payer: Self-pay

## 2019-08-29 ENCOUNTER — Ambulatory Visit (INDEPENDENT_AMBULATORY_CARE_PROVIDER_SITE_OTHER): Payer: Medicare Other | Admitting: Family Medicine

## 2019-08-29 ENCOUNTER — Encounter: Payer: Self-pay | Admitting: Family Medicine

## 2019-08-29 VITALS — BP 140/79 | HR 86 | Temp 97.1°F | Ht 64.0 in | Wt 171.6 lb

## 2019-08-29 DIAGNOSIS — N3000 Acute cystitis without hematuria: Secondary | ICD-10-CM

## 2019-08-29 LAB — MICROSCOPIC EXAMINATION

## 2019-08-29 LAB — URINALYSIS, COMPLETE
Bilirubin, UA: NEGATIVE
Glucose, UA: NEGATIVE
Ketones, UA: NEGATIVE
Nitrite, UA: NEGATIVE
Protein,UA: NEGATIVE
Specific Gravity, UA: 1.025 (ref 1.005–1.030)
Urobilinogen, Ur: 0.2 mg/dL (ref 0.2–1.0)
pH, UA: 5 (ref 5.0–7.5)

## 2019-08-29 MED ORDER — CEPHALEXIN 500 MG PO CAPS: 500.0000 mg | ORAL_CAPSULE | Freq: Four times a day (QID) | ORAL | 0 refills | Status: DC

## 2019-08-29 NOTE — Progress Notes (Signed)
BP 140/79   Pulse 86   Temp (!) 97.1 F (36.2 C) (Temporal)   Ht 5\' 4"  (1.626 m)   Wt 171 lb 9.6 oz (77.8 kg)   SpO2 97%   BMI 29.46 kg/m    Subjective:   [name], female    DOB: 1946/06/06, 73 y.o.   MRN: [id number]  HPI: Heather Barber is a 73 y.o. female presenting on 08/29/2019 for Dysuria (x 1 day) and Hematuria (x 1 day)   HPI Dysuria and hematuria Patient comes in complaining of hematuria and dysuria that started yesterday in the evening and she is had 1 more episode similar today and she is concerned about it about whether or not it could be related to an infection or not.  She denies any fevers or chills but she did have some right lower flank pain that also started.  She does not get these recurrently and thinks it may be an infection but did not know for sure.  Relevant past medical, surgical, family and social history reviewed and updated as indicated. Interim medical history since our last visit reviewed. Allergies and medications reviewed and updated.  Review of Systems  Constitutional: Negative for chills and fever.  Eyes: Negative for visual disturbance.  Respiratory: Negative for chest tightness and shortness of breath.   Cardiovascular: Negative for chest pain and leg swelling.  Gastrointestinal: Negative for abdominal pain.  Genitourinary: Positive for dysuria, flank pain and hematuria. Negative for decreased urine volume, difficulty urinating, urgency, vaginal bleeding, vaginal discharge and vaginal pain.  Musculoskeletal: Negative for back pain and gait problem.  Skin: Negative for rash.  Neurological: Negative for light-headedness and headaches.  Psychiatric/Behavioral: Negative for agitation and behavioral problems.  All other systems reviewed and are negative.   Per HPI unless specifically indicated above   Allergies as of 08/29/2019      Reactions   Codeine Nausea And Vomiting   Morphine Other (See Comments)   GI symptoms     Medication List       Accurate as of August 29, 2019 12:44 PM. If you have any questions, ask your nurse or doctor.        aspirin EC 81 MG tablet Take 81 mg by mouth daily.   atorvastatin 20 MG tablet Commonly known as: LIPITOR Take 1 tablet (20 mg total) by mouth at bedtime.   furosemide 20 MG tablet Commonly known as: LASIX TAKE 1 TABLET (20 MG TOTAL) BY MOUTH DAILY.   hydroxyurea 500 MG capsule Commonly known as: HYDREA Take 500 mg by mouth daily. May take with food to minimize GI side effects.   LORazepam 0.5 MG tablet Commonly known as: ATIVAN Take 0.5 mg by mouth every 8 (eight) hours.   losartan 50 MG tablet Commonly known as: COZAAR Take 1 tablet (50 mg total) by mouth daily.   metoprolol succinate 50 MG 24 hr tablet Commonly known as: TOPROL-XL Take 1 tablet (50 mg total) by mouth 2 (two) times a day. Take with or immediately following a meal.   naproxen sodium 220 MG tablet Commonly known as: ALEVE Take 440 mg by mouth 2 (two) times daily as needed (pain/headache).   omeprazole 20 MG capsule Commonly known as: PRILOSEC Take 1 capsule (20 mg total) by mouth daily.        Objective:   BP 140/79   Pulse 86   Temp (!) 97.1 F (36.2 C) (Temporal)   Ht 5\' 4"  (1.626 m)  Wt 171 lb 9.6 oz (77.8 kg)   SpO2 97%   BMI 29.46 kg/m   Wt Readings from Last 3 Encounters:  08/29/19 171 lb 9.6 oz (77.8 kg)  07/18/19 166 lb 12.8 oz (75.7 kg)  05/26/19 166 lb (75.3 kg)    Physical Exam Vitals signs and nursing note reviewed.  Constitutional:      General: She is not in acute distress.    Appearance: She is well-developed. She is not diaphoretic.  Eyes:     Conjunctiva/sclera: Conjunctivae normal.  Abdominal:     General: Bowel sounds are normal. There is no distension.     Palpations: Abdomen is soft. Abdomen is not rigid. There is no mass.     Tenderness: There is no abdominal tenderness. There is no right CVA tenderness, left CVA tenderness,  guarding or rebound.  Skin:    General: Skin is warm and dry.     Findings: No rash.  Neurological:     Mental Status: She is alert and oriented to person, place, and time.     Coordination: Coordination normal.  Psychiatric:        Behavior: Behavior normal.     Urinalysis: wbc 11-30, RBC 3-10, 0-10 epithelial, mod bacteria, trace blood, leu 1+  Assessment & Plan:   Problem List Items Addressed This Visit    None    Visit Diagnoses    Acute cystitis without hematuria    -  Primary   Relevant Medications   cephALEXin (KEFLEX) 500 MG capsule   Other Relevant Orders   Urinalysis, Complete   Urine Culture      Likely new infection, will treat with Keflex Follow up plan: Return if symptoms worsen or fail to improve.  Counseling provided for all of the vaccine components Orders Placed This Encounter  Procedures  . Urinalysis, Complete    Caryl Pina, MD [facility] 08/29/2019, 12:44 PM

## 2019-09-01 LAB — URINE CULTURE

## 2019-09-05 ENCOUNTER — Telehealth: Payer: Self-pay | Admitting: Obstetrics and Gynecology

## 2019-09-05 NOTE — Telephone Encounter (Signed)

## 2019-09-08 ENCOUNTER — Ambulatory Visit (INDEPENDENT_AMBULATORY_CARE_PROVIDER_SITE_OTHER): Payer: Medicare Other | Admitting: Obstetrics and Gynecology

## 2019-09-08 ENCOUNTER — Encounter: Payer: Self-pay | Admitting: Obstetrics and Gynecology

## 2019-09-08 ENCOUNTER — Other Ambulatory Visit: Payer: Self-pay

## 2019-09-08 VITALS — BP 120/55 | HR 84 | Ht 64.0 in | Wt 171.8 lb

## 2019-09-08 DIAGNOSIS — N950 Postmenopausal bleeding: Secondary | ICD-10-CM | POA: Diagnosis not present

## 2019-09-08 NOTE — Progress Notes (Signed)
[facility] Visit  @DATE @            Patient name: Heather Barber MRN [id number]  Date of birth: 02/17/1946  CC & HPI:  Heather Barber is a 73 y.o. female present intermittent postmenopausal bleeding she has been having some light bleeding ing.  No bleeding today  ROS:  ROS recently widowed husband died after multiple strokes.  No hormone therapy   Pertinent History Reviewed:   Reviewed: Significant for  Medical         Past Medical History:  Diagnosis Date  . Atrial septal defect    Surgical repair in 1994  . Chest pain    Normal coronary angiography in 1994 and 2011  . Dizziness   . Essential thrombocythemia ([place]) 06/06/2016  . GERD (gastroesophageal reflux disease)   . Hyperlipidemia   . Hypertension   . LBBB (left bundle branch block) 2011  . Palpitations   . Syncope   . Type 2 diabetes mellitus with other specified complication ([place]) 123456  . Ventricular tachycardia ([place])    Exercise induced                              Surgical Hx:    Past Surgical History:  Procedure Laterality Date  . ASD [place] EXCISIONAL BIOPSY     Right and left  . CATARACT EXTRACTION W/PHACO Left 03/11/2013   Procedure: CATARACT EXTRACTION PHACO AND INTRAOCULAR LENS PLACEMENT (IOC);  Surgeon: [name], MD;  Location: [facility];  Service: Ophthalmology;  Laterality: Left;  CDE:  12.20  . TONSILLECTOMY    . TUBAL LIGATION     Bilateral   Medications: Reviewed & Updated - see associated section                       Current Outpatient Medications:  .  aspirin EC 81 MG tablet, Take 81 mg by mouth daily., Disp: , Rfl:  .  atorvastatin (LIPITOR) 20 MG tablet, Take 1 tablet (20 mg total) by mouth at bedtime., Disp: 90 tablet, Rfl: 1 .  cephALEXin (KEFLEX) 500 MG capsule, Take 1 capsule (500 mg total) by mouth 4 (four) times daily., Disp: 28 capsule, Rfl: 0 .  furosemide (LASIX) 20 MG tablet,  TAKE 1 TABLET (20 MG TOTAL) BY MOUTH DAILY., Disp: 90 tablet, Rfl: 1 .  hydroxyurea (HYDREA) 500 MG capsule, Take 500 mg by mouth daily. May take with food to minimize GI side effects., Disp: , Rfl:  .  LORazepam (ATIVAN) 0.5 MG tablet, Take 0.5 mg by mouth every 8 (eight) hours., Disp: , Rfl:  .  losartan (COZAAR) 50 MG tablet, Take 1 tablet (50 mg total) by mouth daily., Disp: 90 tablet, Rfl: 3 .  metoprolol succinate (TOPROL-XL) 50 MG 24 hr tablet, Take 1 tablet (50 mg total) by mouth 2 (two) times a day. Take with or immediately following a meal., Disp: 180 tablet, Rfl: 3 .  naproxen sodium (ALEVE) 220 MG tablet, Take 440 mg by mouth 2 (two) times daily as needed (pain/headache)., Disp: , Rfl:  .  omeprazole (PRILOSEC) 20 MG capsule, Take 1 capsule (20 mg total) by mouth daily., Disp: 90 capsule, Rfl: 2   Social  History: Reviewed -  reports that she has never smoked. She has never used smokeless tobacco.  Objective Findings:  Vitals: Blood pressure (!) 120/55, pulse 84, height 5\' 4"  (1.626 m), weight 171 lb 12.8 oz (77.9 kg).  PHYSICAL EXAMINATION General appearance - alert, well appearing, and in no distress Mental status - alert, oriented to person, place, and time, normal mood, behavior, speech, dress, motor activity, and thought processes  PELVIC External genitalia -good support Vulva -normal coloration- Vagina -good support Cervix -tiny no lesions  Uterus -anterior mobile nontender  Adnexa -nontender without masses [place] -  Rectal - s, not done    Assessment & Plan:   A:  1. Postmenopausal bleeding in a 73 year old not sexually active female  P:  1. Transvaginal ultrasound, probable endometrial biopsy after results

## 2019-09-09 ENCOUNTER — Other Ambulatory Visit (HOSPITAL_COMMUNITY)
Admission: RE | Admit: 2019-09-09 | Discharge: 2019-09-09 | Disposition: A | Discharge status: Home / Self Care | Payer: Medicare Other | Source: Ambulatory Visit | Attending: Obstetrics and Gynecology | Admitting: Obstetrics and Gynecology

## 2019-09-09 DIAGNOSIS — N950 Postmenopausal bleeding: Secondary | ICD-10-CM | POA: Diagnosis present

## 2019-09-09 DIAGNOSIS — R87810 Cervical high risk human papillomavirus (HPV) DNA test positive: Secondary | ICD-10-CM | POA: Insufficient documentation

## 2019-09-09 DIAGNOSIS — Z780 Asymptomatic menopausal state: Secondary | ICD-10-CM | POA: Insufficient documentation

## 2019-09-09 DIAGNOSIS — Z1151 Encounter for screening for human papillomavirus (HPV): Secondary | ICD-10-CM | POA: Diagnosis not present

## 2019-09-09 NOTE — Addendum Note (Signed)
Addended by: Christiana Pellant A on: 09/09/2019 01:44 PM   Modules accepted: Orders

## 2019-09-11 ENCOUNTER — Telehealth: Payer: Self-pay | Admitting: *Deleted

## 2019-09-11 LAB — CYTOLOGY - PAP
Comment: NEGATIVE
Comment: NEGATIVE
Comment: NEGATIVE
Diagnosis: NEGATIVE
Diagnosis: REACTIVE
HPV 16: NEGATIVE
HPV 18 / 45: NEGATIVE
High risk HPV: POSITIVE — AB

## 2019-09-11 NOTE — Telephone Encounter (Signed)
Called patient and left message to call back for results.

## 2019-09-11 NOTE — Telephone Encounter (Signed)
-----   Message from [name], MD sent at 09/11/2019  2:13 PM EST ----- HPV positive (!), otherwise benign. Per [facility] , recommendation is repeat pap in a year. Will ask that pt be called.

## 2019-09-12 ENCOUNTER — Telehealth: Payer: Self-pay | Admitting: *Deleted

## 2019-09-12 NOTE — Telephone Encounter (Signed)
Telephoned patient at home number and advised patient of negative pap smear results and positive HPV result. Recommendation is repeat pap smear in one year. Patient voiced understanding.

## 2019-09-12 NOTE — Telephone Encounter (Signed)
Patient called for results °

## 2019-09-12 NOTE — Telephone Encounter (Signed)
Called and left message that I am calling with results.  

## 2019-09-15 ENCOUNTER — Telehealth: Payer: Self-pay | Admitting: *Deleted

## 2019-09-15 ENCOUNTER — Telehealth: Payer: Self-pay | Admitting: Obstetrics & Gynecology

## 2019-09-15 ENCOUNTER — Other Ambulatory Visit: Payer: Self-pay | Admitting: Obstetrics and Gynecology

## 2019-09-15 DIAGNOSIS — N950 Postmenopausal bleeding: Secondary | ICD-10-CM

## 2019-09-15 NOTE — Telephone Encounter (Signed)
Erroneous encounter

## 2019-09-15 NOTE — Telephone Encounter (Signed)

## 2019-09-16 ENCOUNTER — Other Ambulatory Visit: Payer: Self-pay

## 2019-09-16 ENCOUNTER — Ambulatory Visit (INDEPENDENT_AMBULATORY_CARE_PROVIDER_SITE_OTHER): Payer: Medicare Other

## 2019-09-16 DIAGNOSIS — N950 Postmenopausal bleeding: Secondary | ICD-10-CM

## 2019-09-16 NOTE — Progress Notes (Signed)
PELVIC US TA/TV:homogeneous anteflexed uterus,wnl,complex thickened endometrium with color flow mid/fundal area,EEC 16 mm,normal ovaries bilat,ovaries appear mobile,no pain during ultrasound  Chaperone Peggy

## 2019-09-19 ENCOUNTER — Telehealth: Payer: Self-pay | Admitting: Obstetrics and Gynecology

## 2019-09-19 NOTE — Telephone Encounter (Signed)

## 2019-09-22 ENCOUNTER — Encounter: Payer: Self-pay | Admitting: Obstetrics and Gynecology

## 2019-09-22 ENCOUNTER — Other Ambulatory Visit: Payer: Self-pay

## 2019-09-22 ENCOUNTER — Ambulatory Visit (INDEPENDENT_AMBULATORY_CARE_PROVIDER_SITE_OTHER): Payer: Medicare Other | Admitting: Obstetrics and Gynecology

## 2019-09-22 VITALS — BP 113/59 | HR 78 | Ht 63.0 in | Wt 173.8 lb

## 2019-09-22 DIAGNOSIS — N950 Postmenopausal bleeding: Secondary | ICD-10-CM

## 2019-09-22 NOTE — Progress Notes (Signed)
[name], female   DOB: 06/26/1946, 73 y.o.   MRN: [id number]    [facility] Visit  @DATE @            Patient name: Heather Barber MRN [id number]  Date of birth: 07-16-1946  CC & HPI:  Heather Barber is a 73 y.o. female presenting today for gyn f/u of u/s results. Hasn't had anymore postmenopausal bleeding.   ROS:  ROS +endometrial polyp  -postmenopausal bleeding -fever  All systems are negative except as noted in the HPI and PMH.   Pertinent History Reviewed:   Reviewed:  Medical         Past Medical History:  Diagnosis Date   Atrial septal defect    Surgical repair in 1994   Chest pain    Normal coronary angiography in 1994 and 2011   Dizziness    Essential thrombocythemia ([place]) 06/06/2016   GERD (gastroesophageal reflux disease)    Hyperlipidemia    Hypertension    LBBB (left bundle branch block) 2011   Palpitations    Syncope    Type 2 diabetes mellitus with other specified complication ([place]) 123456   Ventricular tachycardia ([place])    Exercise induced                              Surgical Hx:    Past Surgical History:  Procedure Laterality Date   ASD [place] EXCISIONAL BIOPSY     Right and left   CATARACT EXTRACTION W/PHACO Left 03/11/2013   Procedure: CATARACT EXTRACTION PHACO AND INTRAOCULAR LENS PLACEMENT ([place]);  Surgeon: [name], MD;  Location: [facility];  Service: Ophthalmology;  Laterality: Left;  CDE:  12.20   TONSILLECTOMY     TUBAL LIGATION     Bilateral   Medications: Reviewed & Updated - see associated section                       Current Outpatient Medications:    aspirin EC 81 MG tablet, Take 81 mg by mouth daily., Disp: , Rfl:    atorvastatin (LIPITOR) 20 MG tablet, Take 1 tablet (20 mg total) by mouth at bedtime., Disp: 90 tablet, Rfl: 1   cephALEXin (KEFLEX) 500 MG capsule, Take 1 capsule (500 mg total) by mouth 4 (four) times daily., Disp: 28 capsule,  Rfl: 0   furosemide (LASIX) 20 MG tablet, TAKE 1 TABLET (20 MG TOTAL) BY MOUTH DAILY., Disp: 90 tablet, Rfl: 1   hydroxyurea (HYDREA) 500 MG capsule, Take 500 mg by mouth daily. May take with food to minimize GI side effects., Disp: , Rfl:    LORazepam (ATIVAN) 0.5 MG tablet, Take 0.5 mg by mouth every 8 (eight) hours., Disp: , Rfl:    losartan (COZAAR) 50 MG tablet, Take 1 tablet (50 mg total) by mouth daily., Disp: 90 tablet, Rfl: 3   metoprolol succinate (TOPROL-XL) 50 MG 24 hr tablet, Take 1 tablet (50 mg total) by mouth 2 (two) times a day. Take with or immediately following a meal., Disp: 180 tablet, Rfl: 3   naproxen sodium (ALEVE) 220 MG tablet, Take 440 mg by mouth 2 (two) times daily as needed (pain/headache)., Disp: , Rfl:    omeprazole (PRILOSEC) 20 MG capsule, Take 1 capsule (20 mg total) by mouth daily., Disp: 90 capsule, Rfl: 2   Social History:  Reviewed -  reports that she has never smoked. She has never used smokeless tobacco.  Objective Findings:  Vitals: Blood pressure (!) 113/59, pulse 78, height 5\' 3"  (1.6 m), weight 173 lb 12.8 oz (78.8 kg).  PHYSICAL EXAMINATION General appearance - alert, well appearing, and in no distress Mental status - alert, oriented to person, place, and time, normal mood, behavior, speech, dress, motor activity, and thought processes  PELVIC Not done GYNECOLOGIC SONOGRAM   Heather Barber is a 73 y.o. [id number] she is here for a pelvic sonogram for postmenopausal bleeding .  Uterus                      5.3 x 3.7 x 4.3 cm, Total uterine volume 44 cc,homogeneous anteflexed uterus,wnl  Endometrium          16 mm, symmetrical, complex thickened endometrium with color flow mid/fundal area  Right ovary             1.7 x 1.1 x 1.9 cm, wnl  Left ovary                2.5 x 2.2 x 2.1 cm, wnl  No free fluid   Technician Comments:  PELVIC US TA/TV:homogeneous anteflexed uterus,wnl,complex thickened endometrium with color flow  mid/fundal area,EEC 16 mm,normal ovaries bilat,ovaries appear mobile,no pain during ultrasound,pt has f/u appt.scheduled with Dr Glo Herring.  Chaperone Peggy    Silver Huguenin 09/16/2019 5:23 PM  Clinical Impression and recommendations:  I have reviewed the sonogram results above. Postmenopausal bleeding in 73 yr female.   Combined with the patient's current clinical course, below are my impressions and any appropriate recommendations for management based on the sonographic findings:   FINDINGS:  Tiny uterus with heterogenous myometrium.  Thickened endometrium with flow within endometrium suggestive of feeder vessel such as with a polyp Normal ovaries, no pelvic fluid  Endometrial sampling indicated, may require hysteroscopy to remove suspected polyp.    Heather Barber 09/21/2019   Result History  US PELVIS TRANSVANGINAL NON-OB (TV ONLY) (Order [id number]) on 09/21/2019 - Order Result History Report  Encounter-Level Documents - 09/16/2019:  Document on 09/16/2019 4:11 PM by Gloris Manchester: After Visit Summary Electronic signature on 09/16/2019 3:13 PM - E-signed  Assessment & Plan:   A:  1.  Endometrial polyp  P:  1.  D&C for January 2. Pre-op 1 month before    By signing my name below, I, Samul Dada, attest that this documentation has been prepared under the direction and in the presence of [name], MD. Electronically Signed: [place]. 09/22/19. 3:53 PM.  I personally performed the services described in this documentation, which was SCRIBED in my presence. The recorded information has been reviewed and considered accurate. It has been edited as necessary during review. [name], MD

## 2019-09-23 ENCOUNTER — Ambulatory Visit: Payer: Medicare Other

## 2019-10-07 ENCOUNTER — Inpatient Hospital Stay (HOSPITAL_COMMUNITY): Payer: Medicare Other | Attending: Hematology | Admitting: Hematology

## 2019-10-07 ENCOUNTER — Telehealth: Payer: Self-pay | Admitting: Obstetrics and Gynecology

## 2019-10-07 ENCOUNTER — Inpatient Hospital Stay (HOSPITAL_COMMUNITY): Payer: Medicare Other

## 2019-10-07 ENCOUNTER — Other Ambulatory Visit: Payer: Self-pay

## 2019-10-07 DIAGNOSIS — D473 Essential (hemorrhagic) thrombocythemia: Secondary | ICD-10-CM | POA: Insufficient documentation

## 2019-10-07 DIAGNOSIS — I10 Essential (primary) hypertension: Secondary | ICD-10-CM | POA: Insufficient documentation

## 2019-10-07 DIAGNOSIS — D75839 Thrombocytosis, unspecified: Secondary | ICD-10-CM

## 2019-10-07 DIAGNOSIS — E119 Type 2 diabetes mellitus without complications: Secondary | ICD-10-CM | POA: Insufficient documentation

## 2019-10-07 DIAGNOSIS — Z79899 Other long term (current) drug therapy: Secondary | ICD-10-CM | POA: Insufficient documentation

## 2019-10-07 LAB — CBC WITH DIFFERENTIAL/PLATELET
Abs Immature Granulocytes: 0.02 10*3/uL (ref 0.00–0.07)
Basophils Absolute: 0 10*3/uL (ref 0.0–0.1)
Basophils Relative: 0 %
Eosinophils Absolute: 0.1 10*3/uL (ref 0.0–0.5)
Eosinophils Relative: 2 %
HCT: 34.3 % — ABNORMAL LOW (ref 36.0–46.0)
Hemoglobin: 11.8 g/dL — ABNORMAL LOW (ref 12.0–15.0)
Immature Granulocytes: 0 %
Lymphocytes Relative: 44 %
Lymphs Abs: 2.1 10*3/uL (ref 0.7–4.0)
MCH: 40.4 pg — ABNORMAL HIGH (ref 26.0–34.0)
MCHC: 34.4 g/dL (ref 30.0–36.0)
MCV: 117.5 fL — ABNORMAL HIGH (ref 80.0–100.0)
Monocytes Absolute: 0.4 10*3/uL (ref 0.1–1.0)
Monocytes Relative: 9 %
Neutro Abs: 2.2 10*3/uL (ref 1.7–7.7)
Neutrophils Relative %: 45 %
Platelets: 258 10*3/uL (ref 150–400)
RBC: 2.92 MIL/uL — ABNORMAL LOW (ref 3.87–5.11)
RDW: 12.2 % (ref 11.5–15.5)
WBC: 4.8 10*3/uL (ref 4.0–10.5)
nRBC: 0 % (ref 0.0–0.2)

## 2019-10-07 LAB — COMPREHENSIVE METABOLIC PANEL
ALT: 18 U/L (ref 0–44)
AST: 16 U/L (ref 15–41)
Albumin: 4 g/dL (ref 3.5–5.0)
Alkaline Phosphatase: 86 U/L (ref 38–126)
Anion gap: 9 (ref 5–15)
BUN: 19 mg/dL (ref 8–23)
CO2: 26 mmol/L (ref 22–32)
Calcium: 9.2 mg/dL (ref 8.9–10.3)
Chloride: 106 mmol/L (ref 98–111)
Creatinine, Ser: 0.63 mg/dL (ref 0.44–1.00)
GFR calc Af Amer: >60 mL/min (ref >60)
GFR calc non Af Amer: >60 mL/min (ref >60)
Glucose, Bld: 128 mg/dL — ABNORMAL HIGH (ref 70–99)
Potassium: 4.5 mmol/L (ref 3.5–5.1)
Sodium: 141 mmol/L (ref 135–145)
Total Bilirubin: 0.6 mg/dL (ref 0.3–1.2)
Total Protein: 6.9 g/dL (ref 6.5–8.1)

## 2019-10-07 NOTE — Telephone Encounter (Signed)

## 2019-10-07 NOTE — Progress Notes (Signed)
[address] [place], [place] [address]   CLINIC:  Medical Oncology/Hematology  PCP:  Dettinger, Fransisca Kaufmann, MD [place] [id number] [phone]   REASON FOR VISIT:  Follow-up for thrombocytosis  CURRENT THERAPY: Hydrea  BRIEF ONCOLOGIC HISTORY:  Oncology History  Thrombocytosis ([place])  10/05/2015 Initial Diagnosis   Essential thrombocythemia ([place])   10/06/2015 -  Chemotherapy   Hydrea         INTERVAL HISTORY:  Heather Barber 73 y.o. female presents today for follow-up.  She reports overall doing well.  She denies any significant fatigue.  Denies any obvious signs of bleeding.  She is currently on Hydrea 500 mg twice daily, tolerating well.  No change in bowel habits.  Appetite is stable.  No weight loss.  Denies any fevers, chills, night sweats.  She is here for repeat labs and office visit.   REVIEW OF SYSTEMS:  Review of Systems  All other systems reviewed and are negative.    PAST MEDICAL/SURGICAL HISTORY:  Past Medical History:  Diagnosis Date  . Atrial septal defect    Surgical repair in 1994  . Chest pain    Normal coronary angiography in 1994 and 2011  . Dizziness   . Essential thrombocythemia ([place]) 06/06/2016  . GERD (gastroesophageal reflux disease)   . Hyperlipidemia   . Hypertension   . LBBB (left bundle branch block) 2011  . Palpitations   . Syncope   . Type 2 diabetes mellitus with other specified complication ([place]) 123456  . Ventricular tachycardia ([place])    Exercise induced   Past Surgical History:  Procedure Laterality Date  . ASD [place] EXCISIONAL BIOPSY     Right and left  . CATARACT EXTRACTION W/PHACO Left 03/11/2013   Procedure: CATARACT EXTRACTION PHACO AND INTRAOCULAR LENS PLACEMENT (IOC);  Surgeon: [name], MD;  Location: [facility];  Service: Ophthalmology;  Laterality: Left;  CDE:  12.20  . TONSILLECTOMY    . TUBAL LIGATION     Bilateral      SOCIAL HISTORY:  Social History   Socioeconomic History  . Marital status: Widowed    Spouse name: widowed  . Number of children: 3  . Years of education: Not on file  . Highest education level: Not on file  Occupational History  . Occupation: Psychologist, prison and probation services  Social Needs  . Financial resource strain: Not on file  . Food insecurity    Worry: Not on file    Inability: Not on file  . Transportation needs    Medical: Not on file    Non-medical: Not on file  Tobacco Use  . Smoking status: Never Smoker  . Smokeless tobacco: Never Used  Substance and Sexual Activity  . Alcohol use: No  . Drug use: No  . Sexual activity: Not Currently    Birth control/protection: Post-menopausal  Lifestyle  . Physical activity    Days per week: Not on file    Minutes per session: Not on file  . Stress: Not on file  Relationships  . Social Herbalist on phone: Not on file    Gets together: Not on file    Attends religious service: Not on file    Active member of club or organization: Not on file    Attends meetings of clubs or organizations: Not on file    Relationship status: Not on file  .  Intimate partner violence    Fear of current or ex partner: Not on file    Emotionally abused: Not on file    Physically abused: Not on file    Forced sexual activity: Not on file  Other Topics Concern  . Not on file  Social History Narrative   Married and resides in [place] with husband and 3 children   Sedentary lifestyle    FAMILY HISTORY:  Family History  Problem Relation Age of Onset  . Cancer Mother        female  . Stroke Sister   . Cancer Sister        breast  . Heart failure Father   . Coronary artery disease Neg Hx     CURRENT MEDICATIONS:  Outpatient Encounter Medications as of 10/07/2019  Medication Sig  . aspirin EC 81 MG tablet Take 81 mg by mouth daily.  Marland Kitchen atorvastatin (LIPITOR) 20 MG tablet Take 1 tablet (20 mg total) by mouth at bedtime.  .  furosemide (LASIX) 20 MG tablet TAKE 1 TABLET (20 MG TOTAL) BY MOUTH DAILY.  . hydroxyurea (HYDREA) 500 MG capsule Take 500 mg by mouth daily. May take with food to minimize GI side effects.  Marland Kitchen LORazepam (ATIVAN) 0.5 MG tablet Take 0.5 mg by mouth every 8 (eight) hours.  Marland Kitchen losartan (COZAAR) 50 MG tablet Take 1 tablet (50 mg total) by mouth daily.  . metoprolol succinate (TOPROL-XL) 50 MG 24 hr tablet Take 1 tablet (50 mg total) by mouth 2 (two) times a day. Take with or immediately following a meal.  . naproxen sodium (ALEVE) 220 MG tablet Take 440 mg by mouth 2 (two) times daily as needed (pain/headache).  Marland Kitchen omeprazole (PRILOSEC) 20 MG capsule Take 1 capsule (20 mg total) by mouth daily.  . [DISCONTINUED] cephALEXin (KEFLEX) 500 MG capsule Take 1 capsule (500 mg total) by mouth 4 (four) times daily.   No facility-administered encounter medications on file as of 10/07/2019.     ALLERGIES:  Allergies  Allergen Reactions  . Codeine Nausea And Vomiting  . Morphine Other (See Comments)    GI symptoms     PHYSICAL EXAM:  ECOG Performance status: 1  Vitals:   10/07/19 1300  BP: (!) 146/72  Pulse: 70  Resp: 16  Temp: (!) 97.3 F (36.3 C)  SpO2: 97%   Filed Weights   10/07/19 1300  Weight: 175 lb 6 oz (79.5 kg)    Physical Exam Constitutional:      Appearance: Normal appearance. She is obese.  HENT:     Head: Normocephalic.  Eyes:     Conjunctiva/sclera: Conjunctivae normal.  Neck:     Musculoskeletal: Normal range of motion.  Cardiovascular:     Rate and Rhythm: Normal rate and regular rhythm.     Pulses: Normal pulses.     Heart sounds: Normal heart sounds.  Pulmonary:     Effort: Pulmonary effort is normal.     Breath sounds: Normal breath sounds.  Abdominal:     General: Bowel sounds are normal.  Musculoskeletal:     Comments: Decreased range of motion  Skin:    General: Skin is warm.  Neurological:     General: No focal deficit present.     Mental Status: She  is alert and oriented to person, place, and time.  Psychiatric:        Mood and Affect: Mood normal.        Behavior: Behavior normal.  LABORATORY DATA:  I have reviewed the labs as listed.  CBC    Component Value Date/Time   WBC 4.8 10/07/2019 1301   RBC 2.92 (L) 10/07/2019 1301   HGB 11.8 (L) 10/07/2019 1301   HGB 13.5 07/18/2019 1026   HCT 34.3 (L) 10/07/2019 1301   HCT 36.8 07/18/2019 1026   PLT 258 10/07/2019 1301   PLT 290 07/18/2019 1026   MCV 117.5 (H) 10/07/2019 1301   MCV 112 (H) 07/18/2019 1026   MCH 40.4 (H) 10/07/2019 1301   MCHC 34.4 10/07/2019 1301   RDW 12.2 10/07/2019 1301   RDW 11.9 07/18/2019 1026   LYMPHSABS 2.1 10/07/2019 1301   LYMPHSABS 2.2 07/18/2019 1026   MONOABS 0.4 10/07/2019 1301   EOSABS 0.1 10/07/2019 1301   EOSABS 0.1 07/18/2019 1026   BASOSABS 0.0 10/07/2019 1301   BASOSABS 0.1 07/18/2019 1026   CMP Latest Ref Rng & Units 10/07/2019 07/18/2019 04/08/2019  Glucose 70 - 99 mg/dL 128(H) 98 109(H)  BUN 8 - 23 mg/dL 19 13 14   Creatinine 0.44 - 1.00 mg/dL 0.63 0.75 0.79  Sodium 135 - 145 mmol/L 141 140 141  Potassium 3.5 - 5.1 mmol/L 4.5 4.8 4.1  Chloride 98 - 111 mmol/L 106 100 101  CO2 22 - 32 mmol/L 26 25 26   Calcium 8.9 - 10.3 mg/dL 9.2 10.3 9.4  Total Protein 6.5 - 8.1 g/dL 6.9 6.8 7.4  Total Bilirubin 0.3 - 1.2 mg/dL 0.6 0.3 0.5  Alkaline Phos 38 - 126 U/L 86 113 88  AST 15 - 41 U/L 16 11 15   ALT 0 - 44 U/L 18 15 20           ASSESSMENT & PLAN:   Thrombocytosis ([name]) 1.  [name]: -Did not have any prior history of thrombosis.  Denies any vasomotor symptoms. -Because of her advanced age, she was started on hydroxyurea.  She is currently taking hydroxyurea 500 mg twice daily. -She is continuing aspirin 81 mg daily. -She does have occasional nausea from hydroxyurea.  Denies any vomiting.  Denies any fevers or infections. - We discussed the blood work today.  Hematocrit is 34.3.  Platelet count is  258. Recommend continue Hydrea 500mg  BID.  Physical examination did not reveal any splenomegaly. -We will see her back in 6 months for follow-up with labs.        [place] [phone]

## 2019-10-07 NOTE — Assessment & Plan Note (Signed)
1.  [name]: -Did not have any prior history of thrombosis.  Denies any vasomotor symptoms. -Because of her advanced age, she was started on hydroxyurea.  She is currently taking hydroxyurea 500 mg twice daily. -She is continuing aspirin 81 mg daily. -She does have occasional nausea from hydroxyurea.  Denies any vomiting.  Denies any fevers or infections. - We discussed the blood work today.  Hematocrit is 34.3.  Platelet count is 258. Recommend continue Hydrea 500mg  BID.  Physical examination did not reveal any splenomegaly. -We will see her back in 6 months for follow-up with labs.

## 2019-10-08 ENCOUNTER — Encounter: Payer: Self-pay | Admitting: Obstetrics and Gynecology

## 2019-10-08 ENCOUNTER — Ambulatory Visit (INDEPENDENT_AMBULATORY_CARE_PROVIDER_SITE_OTHER): Payer: Medicare Other | Admitting: Obstetrics and Gynecology

## 2019-10-08 ENCOUNTER — Other Ambulatory Visit: Payer: Self-pay

## 2019-10-08 VITALS — BP 148/81 | HR 85 | Ht 64.0 in | Wt 173.0 lb

## 2019-10-08 DIAGNOSIS — N950 Postmenopausal bleeding: Secondary | ICD-10-CM

## 2019-10-08 DIAGNOSIS — N840 Polyp of corpus uteri: Secondary | ICD-10-CM

## 2019-10-08 MED ORDER — ESTRADIOL 10 MCG VA TABS: 1.0000 | ORAL_TABLET | VAGINAL | 1 refills | Status: DC

## 2019-10-08 NOTE — Progress Notes (Addendum)
[name], female   DOB: 1945-12-06, 73 y.o.   MRN: [id number]  Preoperative History and Physical  Heather Barber is a 73 y.o. [phone] here for surgical management of PMB and endometrial polyp.   No significant preoperative concerns. Pt is wondering if she can have the surgery before the end of the year instead of January because of her insurance.copays  Proposed surgery: D&C/HYSTEROSCOPY, removal of endometial polyp scheduled for 11/10/2018,will try to change to Dec 29  Past Medical History:  Diagnosis Date  . Atrial septal defect    Surgical repair in 1994  . Chest pain    Normal coronary angiography in 1994 and 2011  . Dizziness   . Essential thrombocythemia ([place]) 06/06/2016  . GERD (gastroesophageal reflux disease)   . Hyperlipidemia   . Hypertension   . LBBB (left bundle branch block) 2011  . Palpitations   . Syncope   . Type 2 diabetes mellitus with other specified complication ([place]) 123456  . Ventricular tachycardia ([place])    Exercise induced   Past Surgical History:  Procedure Laterality Date  . ASD [place] EXCISIONAL BIOPSY     Right and left  . CATARACT EXTRACTION W/PHACO Left 03/11/2013   Procedure: CATARACT EXTRACTION PHACO AND INTRAOCULAR LENS PLACEMENT (IOC);  Surgeon: [name], MD;  Location: [facility];  Service: Ophthalmology;  Laterality: Left;  CDE:  12.20  . TONSILLECTOMY    . TUBAL LIGATION     Bilateral   OB History  Gravida Para Term Preterm AB Living  4 4 4     3   SAB TAB Ectopic Multiple Live Births               # Outcome Date GA Lbr Len/2nd Weight Sex Delivery Anes PTL Lv  4 Term           3 Term           2 Term           1 Term           Patient denies any other pertinent gynecologic issues.   Current Outpatient Medications on File Prior to Visit  Medication Sig Dispense Refill  . aspirin EC 81 MG tablet Take 81 mg by mouth daily.    Marland Kitchen atorvastatin (LIPITOR) 20 MG tablet Take 1  tablet (20 mg total) by mouth at bedtime. 90 tablet 1  . furosemide (LASIX) 20 MG tablet TAKE 1 TABLET (20 MG TOTAL) BY MOUTH DAILY. 90 tablet 1  . hydroxyurea (HYDREA) 500 MG capsule Take 500 mg by mouth daily. May take with food to minimize GI side effects.    Marland Kitchen LORazepam (ATIVAN) 0.5 MG tablet Take 0.5 mg by mouth every 8 (eight) hours.    Marland Kitchen losartan (COZAAR) 50 MG tablet Take 1 tablet (50 mg total) by mouth daily. 90 tablet 3  . metoprolol succinate (TOPROL-XL) 50 MG 24 hr tablet Take 1 tablet (50 mg total) by mouth 2 (two) times a day. Take with or immediately following a meal. 180 tablet 3  . naproxen sodium (ALEVE) 220 MG tablet Take 440 mg by mouth 2 (two) times daily as needed (pain/headache).    Marland Kitchen omeprazole (PRILOSEC) 20 MG capsule Take 1 capsule (20 mg total) by mouth daily. 90 capsule 2   No current facility-administered medications on file prior to visit.    Allergies  Allergen Reactions  . Codeine Nausea  And Vomiting  . Morphine Other (See Comments)    GI symptoms    Social History:   reports that she has never smoked. She has never used smokeless tobacco. She reports that she does not drink alcohol or use drugs.  Family History  Problem Relation Age of Onset  . Cancer Mother        female  . Stroke Sister   . Cancer Sister        breast  . Heart failure Father   . Coronary artery disease Neg Hx     Review of Systems: Noncontributory  PHYSICAL EXAM: Blood pressure (!) 148/81, pulse 85, height 5\' 4"  (1.626 m), weight 173 lb (78.5 kg). General appearance - alert, well appearing, and in no distress Chest - clear to auscultation, no wheezes, rales or rhonchi, symmetric air entry Heart - normal rate and regular rhythm Abdomen - soft, nontender, nondistended, no masses or organomegaly Pelvic -  External genitalia: several benign appearing skin tags Cervix: light bleeding Uterus: anterior Vagina: atrophic tissues Extremities - peripheral pulses normal, no pedal  edema, no clubbing or cyanosis  Labs: Results for orders placed or performed in visit on 10/07/19 (from the past 336 hour(s))  CBC with Differential   Collection Time: 10/07/19  1:01 PM  Result Value Ref Range   WBC 4.8 4.0 - 10.5 K/uL   RBC 2.92 (L) 3.87 - 5.11 MIL/uL   Hemoglobin 11.8 (L) 12.0 - 15.0 g/dL   HCT 34.3 (L) 36.0 - 46.0 %   MCV 117.5 (H) 80.0 - 100.0 fL   MCH 40.4 (H) 26.0 - 34.0 pg   MCHC 34.4 30.0 - 36.0 g/dL   RDW 12.2 11.5 - 15.5 %   Platelets 258 150 - 400 K/uL   nRBC 0.0 0.0 - 0.2 %   Neutrophils Relative % 45 %   Neutro Abs 2.2 1.7 - 7.7 K/uL   Lymphocytes Relative 44 %   Lymphs Abs 2.1 0.7 - 4.0 K/uL   Monocytes Relative 9 %   Monocytes Absolute 0.4 0.1 - 1.0 K/uL   Eosinophils Relative 2 %   Eosinophils Absolute 0.1 0.0 - 0.5 K/uL   Basophils Relative 0 %   Basophils Absolute 0.0 0.0 - 0.1 K/uL   RBC Morphology MACROCYTOSIS    Immature Granulocytes 0 %   Abs Immature Granulocytes 0.02 0.00 - 0.07 K/uL  Comprehensive metabolic panel   Collection Time: 10/07/19  1:01 PM  Result Value Ref Range   Sodium 141 135 - 145 mmol/L   Potassium 4.5 3.5 - 5.1 mmol/L   Chloride 106 98 - 111 mmol/L   CO2 26 22 - 32 mmol/L   Glucose, Bld 128 (H) 70 - 99 mg/dL   BUN 19 8 - 23 mg/dL   Creatinine, Ser 0.63 0.44 - 1.00 mg/dL   Calcium 9.2 8.9 - 10.3 mg/dL   Total Protein 6.9 6.5 - 8.1 g/dL   Albumin 4.0 3.5 - 5.0 g/dL   AST 16 15 - 41 U/L   ALT 18 0 - 44 U/L   Alkaline Phosphatase 86 38 - 126 U/L   Total Bilirubin 0.6 0.3 - 1.2 mg/dL   GFR calc non Af Amer >60 >60 mL/min   GFR calc Af Amer >60 >60 mL/min   Anion gap 9 5 - 15    Imaging Studies: US Pelvis Transvanginal Non-ob (tv Only)  Result Date: 09/21/2019 GYNECOLOGIC SONOGRAM Heather Barber is a 73 y.o. [id number] she is here for a  pelvic sonogram for postmenopausal bleeding . Uterus                      5.3 x 3.7 x 4.3 cm, Total uterine volume 44 cc,homogeneous anteflexed uterus,wnl Endometrium          16  mm, symmetrical, complex thickened endometrium with color flow mid/fundal area Right ovary             1.7 x 1.1 x 1.9 cm, wnl Left ovary                2.5 x 2.2 x 2.1 cm, wnl No free fluid Technician Comments: PELVIC US TA/TV:homogeneous anteflexed uterus,wnl,complex thickened endometrium with color flow mid/fundal area,EEC 16 mm,normal ovaries bilat,ovaries appear mobile,no pain during ultrasound,pt has f/u appt.scheduled with Dr Glo Herring. Chaperone Peggy Silver [place] 09/16/2019 5:23 PM Clinical Impression and recommendations: I have reviewed the sonogram results above. Postmenopausal bleeding in 73 yr female. Combined with the patient's current clinical course, below are my impressions and any appropriate recommendations for management based on the sonographic findings: FINDINGS: Tiny uterus with heterogenous myometrium. Thickened endometrium with flow within endometrium suggestive of feeder vessel such as with a polyp Normal ovaries, no pelvic fluid Endometrial sampling indicated, may require hysteroscopy to remove suspected polyp. Margy Clarks 09/21/2019   US Pelvis Complete  Result Date: 09/21/2019 GYNECOLOGIC SONOGRAM Heather Barber is a 72 y.o. [id number] she is here for a pelvic sonogram for postmenopausal bleeding . Uterus                      5.3 x 3.7 x 4.3 cm, Total uterine volume 44 cc,homogeneous anteflexed uterus,wnl Endometrium          16 mm, symmetrical, complex thickened endometrium with color flow mid/fundal area Right ovary             1.7 x 1.1 x 1.9 cm, wnl Left ovary                2.5 x 2.2 x 2.1 cm, wnl No free fluid Technician Comments: PELVIC US TA/TV:homogeneous anteflexed uterus,wnl,complex thickened endometrium with color flow mid/fundal area,EEC 16 mm,normal ovaries bilat,ovaries appear mobile,no pain during ultrasound,pt has f/u appt.scheduled with Dr Glo Herring. Chaperone Peggy Silver [place]enin 09/16/2019 5:23 PM Clinical Impression and recommendations: I have reviewed the  sonogram results above. Postmenopausal bleeding in 74 yr female. Combined with the patient's current clinical course, below are my impressions and any appropriate recommendations for management based on the sonographic findings: FINDINGS: Tiny uterus with heterogenous myometrium. Thickened endometrium with flow within endometrium suggestive of feeder vessel such as with a polyp Normal ovaries, no pelvic fluid Endometrial sampling indicated, may require hysteroscopy to remove suspected polyp. Margy Clarks 09/21/2019    Assessment: Patient Active Problem List   Diagnosis Date Noted  . Type 2 diabetes mellitus with other specified complication ([place]) 123456  . SVT (supraventricular tachycardia) ([place]) 05/21/2019  . Chest pain 12/07/2017  . Gastroesophageal reflux disease 07/24/2016  . Thrombocytosis ([place]) 06/06/2016  . Breast thickening right , with negative mammogram. 09/27/2015  . LBBB (left bundle branch block)   . Ventricular tachycardia ([place])   . ASD (atrial septal defect)   . Hypertension   . Hyperlipidemia 04/27/2010    Plan: 1. Explained the treatment plan to the pt's daughter by phone  2. Patient will undergo surgical management with D&C/HYSTEROSCOPY scheduled for 11/10/2018. Try to reschedule for the last Tuesday of  the year  By signing my name below, I, De Burrs, attest that this documentation has been prepared under the direction and in the presence of [name], MD. Electronically Signed: De Burrs, Medical Scribe. 10/08/19. 2:33 PM.  I personally performed the services described in this documentation, which was SCRIBED in my presence. The recorded information has been reviewed and considered accurate. It has been edited as necessary during review. [name], MD

## 2019-10-09 DIAGNOSIS — C541 Malignant neoplasm of endometrium: Secondary | ICD-10-CM

## 2019-10-09 DIAGNOSIS — N950 Postmenopausal bleeding: Secondary | ICD-10-CM | POA: Insufficient documentation

## 2019-10-09 DIAGNOSIS — N840 Polyp of corpus uteri: Secondary | ICD-10-CM | POA: Insufficient documentation

## 2019-10-09 HISTORY — DX: Malignant neoplasm of endometrium: C54.1

## 2019-10-13 ENCOUNTER — Other Ambulatory Visit: Payer: Self-pay

## 2019-10-13 ENCOUNTER — Ambulatory Visit: Payer: Medicare Other

## 2019-11-03 ENCOUNTER — Other Ambulatory Visit: Payer: Self-pay | Admitting: Obstetrics and Gynecology

## 2019-11-03 NOTE — Progress Notes (Signed)
Preoperative History and Physical  Heather Barber is a 74 y.o. [phone] here for surgical management of PMB and endometrial polyp. No significant preoperative concerns. Pt is wondering if she can have the surgery before the end of the year instead of January because of her insurance.copays  Proposed surgery: D&C/HYSTEROSCOPY, removal of endometial polyp scheduled for 11/10/2018,will try to change to Dec 29      Past Medical History:  Diagnosis Date  . Atrial septal defect    Surgical repair in 1994  . Chest pain    Normal coronary angiography in 1994 and 2011  . Dizziness   . Essential thrombocythemia ([place]) 06/06/2016  . GERD (gastroesophageal reflux disease)   . Hyperlipidemia   . Hypertension   . LBBB (left bundle branch block) 2011  . Palpitations   . Syncope   . Type 2 diabetes mellitus with other specified complication ([place]) 123456  . Ventricular tachycardia ([place])    Exercise induced        Past Surgical History:  Procedure Laterality Date  . ASD [place] EXCISIONAL BIOPSY     Right and left  . CATARACT EXTRACTION W/PHACO Left 03/11/2013   Procedure: CATARACT EXTRACTION PHACO AND INTRAOCULAR LENS PLACEMENT (IOC); Surgeon: [name], MD; Location: [facility]; Service: Ophthalmology; Laterality: Left; CDE: 12.20  . TONSILLECTOMY    . TUBAL LIGATION     Bilateral                   OB History  Gravida Para Term Preterm AB Living  4 4 4   3   SAB TAB Ectopic Multiple Live Births                # Outcome Date GA Lbr Len/2nd Weight Sex Delivery Anes PTL Lv  4 Term           3 Term           2 Term           1 Term           Patient denies any other pertinent gynecologic issues.        Current Outpatient Medications on File Prior to Visit  Medication Sig Dispense Refill  . aspirin EC 81 MG tablet Take 81 mg by mouth daily.    Marland Kitchen atorvastatin (LIPITOR) 20 MG tablet Take 1 tablet (20 mg total) by mouth at bedtime. 90 tablet 1  .  furosemide (LASIX) 20 MG tablet TAKE 1 TABLET (20 MG TOTAL) BY MOUTH DAILY. 90 tablet 1  . hydroxyurea (HYDREA) 500 MG capsule Take 500 mg by mouth daily. May take with food to minimize GI side effects.    Marland Kitchen LORazepam (ATIVAN) 0.5 MG tablet Take 0.5 mg by mouth every 8 (eight) hours.    Marland Kitchen losartan (COZAAR) 50 MG tablet Take 1 tablet (50 mg total) by mouth daily. 90 tablet 3  . metoprolol succinate (TOPROL-XL) 50 MG 24 hr tablet Take 1 tablet (50 mg total) by mouth 2 (two) times a day. Take with or immediately following a meal. 180 tablet 3  . naproxen sodium (ALEVE) 220 MG tablet Take 440 mg by mouth 2 (two) times daily as needed (pain/headache).    Marland Kitchen omeprazole (PRILOSEC) 20 MG capsule Take 1 capsule (20 mg total) by mouth daily. 90 capsule 2   No current facility-administered medications on file prior to visit.  Allergies  Allergen Reactions  . Codeine Nausea And Vomiting  . Morphine Other (See Comments)    GI symptoms   Social History: reports that she has never smoked. She has never used smokeless tobacco. She reports that she does not drink alcohol or use drugs.       Family History  Problem Relation Age of Onset  . Cancer Mother    female  . Stroke Sister   . Cancer Sister    breast  . Heart failure Father   . Coronary artery disease Neg Hx    Review of Systems: Noncontributory  PHYSICAL EXAM:  Blood pressure (!) 148/81, pulse 85, height 5\' 4"  (1.626 m), weight 173 lb (78.5 kg).  General appearance - alert, well appearing, and in no distress  Chest - clear to auscultation, no wheezes, rales or rhonchi, symmetric air entry  Heart - normal rate and regular rhythm  Abdomen - soft, nontender, nondistended, no masses or organomegaly  Pelvic -  External genitalia: several benign appearing skin tags  Cervix: light bleeding  Uterus: anterior  Vagina: atrophic tissues  Extremities - peripheral pulses normal, no pedal edema, no clubbing or cyanosis  Labs:       Results  for orders placed or performed in visit on 10/07/19 (from the past 336 hour(s))  CBC with Differential   Collection Time: 10/07/19 1:01 PM  Result Value Ref Range   WBC 4.8 4.0 - 10.5 K/uL   RBC 2.92 (L) 3.87 - 5.11 MIL/uL   Hemoglobin 11.8 (L) 12.0 - 15.0 g/dL   HCT 34.3 (L) 36.0 - 46.0 %   MCV 117.5 (H) 80.0 - 100.0 fL   MCH 40.4 (H) 26.0 - 34.0 pg   MCHC 34.4 30.0 - 36.0 g/dL   RDW 12.2 11.5 - 15.5 %   Platelets 258 150 - 400 K/uL   nRBC 0.0 0.0 - 0.2 %   Neutrophils Relative % 45 %   Neutro Abs 2.2 1.7 - 7.7 K/uL   Lymphocytes Relative 44 %   Lymphs Abs 2.1 0.7 - 4.0 K/uL   Monocytes Relative 9 %   Monocytes Absolute 0.4 0.1 - 1.0 K/uL   Eosinophils Relative 2 %   Eosinophils Absolute 0.1 0.0 - 0.5 K/uL   Basophils Relative 0 %   Basophils Absolute 0.0 0.0 - 0.1 K/uL   RBC Morphology MACROCYTOSIS    Immature Granulocytes 0 %   Abs Immature Granulocytes 0.02 0.00 - 0.07 K/uL  Comprehensive metabolic panel   Collection Time: 10/07/19 1:01 PM  Result Value Ref Range   Sodium 141 135 - 145 mmol/L   Potassium 4.5 3.5 - 5.1 mmol/L   Chloride 106 98 - 111 mmol/L   CO2 26 22 - 32 mmol/L   Glucose, Bld 128 (H) 70 - 99 mg/dL   BUN 19 8 - 23 mg/dL   Creatinine, Ser 0.63 0.44 - 1.00 mg/dL   Calcium 9.2 8.9 - 10.3 mg/dL   Total Protein 6.9 6.5 - 8.1 g/dL   Albumin 4.0 3.5 - 5.0 g/dL   AST 16 15 - 41 U/L   ALT 18 0 - 44 U/L   Alkaline Phosphatase 86 38 - 126 U/L   Total Bilirubin 0.6 0.3 - 1.2 mg/dL   GFR calc non Af Amer >60 >60 mL/min   GFR calc Af Amer >60 >60 mL/min   Anion gap 9 5 - 15   Imaging Studies:  Imaging Results    Assessment:  Patient Active Problem List   Diagnosis Date Noted  . Type 2 diabetes mellitus with other specified complication ([place]) 123456  . SVT (supraventricular tachycardia) ([place]) 05/21/2019  . Chest pain 12/07/2017  . Gastroesophageal reflux disease 07/24/2016  . Thrombocytosis ([place]) 06/06/2016  . Breast thickening right , with  negative mammogram. 09/27/2015  . LBBB (left bundle branch block)   . Ventricular tachycardia ([place])   . ASD (atrial septal defect)   . Hypertension   . Hyperlipidemia 04/27/2010   Plan:  1. Explained the treatment plan to the pt's daughter by phone  2. Patient will undergo surgical management with D&C/HYSTEROSCOPY scheduled for 11/10/2018. Try to reschedule for the last Tuesday of the year  By signing my name below, I, De Burrs, attest that this documentation has been prepared under the direction and in the presence of [name], MD.  Electronically Signed: De Burrs, Medical Scribe. 10/08/19. 2:33 PM.  I personally performed the services described in this documentation, which was SCRIBED in my presence. The recorded information has been reviewed and considered accurate. It has been edited as necessary during review.  [name], MD

## 2019-11-05 NOTE — Patient Instructions (Signed)
Heather Barber  11/05/2019     @PREFPERIOPPHARMACY @   Your procedure is scheduled on  11/11/2019 .  Report to [facility] at  0830  A.M.  Call this number if you have problems the morning of surgery:  [phone]   Remember:  Do not eat or drink after midnight.                         Take these medicines the morning of surgery with A SIP OF WATER  Hydroyurea, metoprolol.    Do not wear jewelry, make-up or nail polish.  Do not wear lotions, powders, or perfumes. Please wear deodorant and brush your teeth.  Do not shave 48 hours prior to surgery.  Men may shave face and neck.  Do not bring valuables to the hospital.  [facility] is not responsible for any belongings or valuables.  Contacts, dentures or bridgework may not be worn into surgery.  Leave your suitcase in the car.  After surgery it may be brought to your room.  For patients admitted to the hospital, discharge time will be determined by your treatment team.  Patients discharged the day of surgery will not be allowed to drive home.   Name and phone number of your driver:   family Special instructions:  DO NOT take any medications for diabetes the morning of your surgery.  Please read over the following fact sheets that you were given. Anesthesia Post-op Instructions and Care and Recovery After Surgery       Dilation and Curettage or Vacuum Curettage, Care After These instructions give you information about caring for yourself after your procedure. Your doctor may also give you more specific instructions. Call your doctor if you have any problems or questions after your procedure. Follow these instructions at home: Activity  Do not drive or use heavy machinery while taking prescription pain medicine.  For 24 hours after your procedure, avoid driving.  Take short walks often, followed by rest periods. Ask your doctor what activities are safe for you. After one or two days, you may be able to  return to your normal activities.  Do not lift anything that is heavier than 10 lb (4.5 kg) until your doctor approves.  For at least 2 weeks, or as long as told by your doctor: ? Do not douche. ? Do not use tampons. ? Do not have sex. General instructions   Take over-the-counter and prescription medicines only as told by your doctor. This is very important if you take blood thinning medicine.  Do not take baths, swim, or use a hot tub until your doctor approves. Take showers instead of baths.  Wear compression stockings as told by your doctor.  It is up to you to get the results of your procedure. Ask your doctor when your results will be ready.  Keep all follow-up visits as told by your doctor. This is important. Contact a doctor if:  You have very bad cramps that get worse or do not get better with medicine.  You have very bad pain in your belly (abdomen).  You cannot drink fluids without throwing up (vomiting).  You get pain in a different part of the area between your belly and thighs (pelvis).  You have bad-smelling discharge from your vagina.  You have a rash. Get help right away if:  You are bleeding a lot from your vagina. A lot  of bleeding means soaking more than one sanitary pad in an hour, for 2 hours in a row.  You have clumps of blood (blood clots) coming from your vagina.  You have a fever or chills.  Your belly feels very tender or hard.  You have chest pain.  You have trouble breathing.  You cough up blood.  You feel dizzy.  You feel light-headed.  You pass out (faint).  You have pain in your neck or shoulder area. Summary  Take short walks often, followed by rest periods. Ask your doctor what activities are safe for you. After one or two days, you may be able to return to your normal activities.  Do not lift anything that is heavier than 10 lb (4.5 kg) until your doctor approves.  Do not take baths, swim, or use a hot tub until your  doctor approves. Take showers instead of baths.  Contact your doctor if you have any symptoms of infection, like bad-smelling discharge from your vagina. This information is not intended to replace advice given to you by your health care provider. Make sure you discuss any questions you have with your health care provider. Document Revised: 09/28/2017 Document Reviewed: 07/03/2016 Elsevier Patient Education  [place].  Hysteroscopy, Care After This sheet gives you information about how to care for yourself after your procedure. Your health care provider may also give you more specific instructions. If you have problems or questions, contact your health care provider. What can I expect after the procedure? After the procedure, it is common to have:  Cramping.  Bleeding. This can vary from light spotting to menstrual-like bleeding. Follow these instructions at home: Activity  Rest for 1-2 days after the procedure.  Do not douche, use tampons, or have sex for 2 weeks after the procedure, or until your health care provider approves.  Do not drive for 24 hours after the procedure, or for as long as told by your health care provider.  Do not drive, use heavy machinery, or drink alcohol while taking prescription pain medicines. Medicines   Take over-the-counter and prescription medicines only as told by your health care provider.  Do not take aspirin during recovery. It can increase the risk of bleeding. General instructions  Do not take baths, swim, or use a hot tub until your health care provider approves. Take showers instead of baths for 2 weeks, or for as long as told by your health care provider.  To prevent or treat constipation while you are taking prescription pain medicine, your health care provider may recommend that you: ? Drink enough fluid to keep your urine clear or pale yellow. ? Take over-the-counter or prescription medicines. ? Eat foods that are high in  fiber, such as fresh fruits and vegetables, whole grains, and beans. ? Limit foods that are high in fat and processed sugars, such as fried and sweet foods.  Keep all follow-up visits as told by your health care provider. This is important. Contact a health care provider if:  You feel dizzy or lightheaded.  You feel nauseous.  You have abnormal vaginal discharge.  You have a rash.  You have pain that does not get better with medicine.  You have chills. Get help right away if:  You have bleeding that is heavier than a normal menstrual period.  You have a fever.  You have pain or cramps that get worse.  You develop new abdominal pain.  You faint.  You have pain in your  shoulders.  You have shortness of breath. Summary  After the procedure, you may have cramping and some vaginal bleeding.  Do not douche, use tampons, or have sex for 2 weeks after the procedure, or until your health care provider approves.  Do not take baths, swim, or use a hot tub until your health care provider approves. Take showers instead of baths for 2 weeks, or for as long as told by your health care provider.  Report any unusual symptoms to your health care provider.  Keep all follow-up visits as told by your health care provider. This is important. This information is not intended to replace advice given to you by your health care provider. Make sure you discuss any questions you have with your health care provider. Document Revised: 09/28/2017 Document Reviewed: 11/14/2016 Elsevier Patient Education  2020 [place] Anesthesia, Adult, Care After This sheet gives you information about how to care for yourself after your procedure. Your health care provider may also give you more specific instructions. If you have problems or questions, contact your health care provider. What can I expect after the procedure? After the procedure, the following side effects are common:  Pain or  discomfort at the IV site.  Nausea.  Vomiting.  Sore throat.  Trouble concentrating.  Feeling cold or chills.  Weak or tired.  Sleepiness and fatigue.  Soreness and body aches. These side effects can affect parts of the body that were not involved in surgery. Follow these instructions at home:  For at least 24 hours after the procedure:  Have a responsible adult stay with you. It is important to have someone help care for you until you are awake and alert.  Rest as needed.  Do not: ? Participate in activities in which you could fall or become injured. ? Drive. ? Use heavy machinery. ? Drink alcohol. ? Take sleeping pills or medicines that cause drowsiness. ? Make important decisions or sign legal documents. ? Take care of children on your own. Eating and drinking  Follow any instructions from your health care provider about eating or drinking restrictions.  When you feel hungry, start by eating small amounts of foods that are soft and easy to digest (bland), such as toast. Gradually return to your regular diet.  Drink enough fluid to keep your urine pale yellow.  If you vomit, rehydrate by drinking water, juice, or clear broth. General instructions  If you have sleep apnea, surgery and certain medicines can increase your risk for breathing problems. Follow instructions from your health care provider about wearing your sleep device: ? Anytime you are sleeping, including during daytime naps. ? While taking prescription pain medicines, sleeping medicines, or medicines that make you drowsy.  Return to your normal activities as told by your health care provider. Ask your health care provider what activities are safe for you.  Take over-the-counter and prescription medicines only as told by your health care provider.  If you smoke, do not smoke without supervision.  Keep all follow-up visits as told by your health care provider. This is important. Contact a health  care provider if:  You have nausea or vomiting that does not get better with medicine.  You cannot eat or drink without vomiting.  You have pain that does not get better with medicine.  You are unable to pass urine.  You develop a skin rash.  You have a fever.  You have redness around your IV site that gets worse. Get help  right away if:  You have difficulty breathing.  You have chest pain.  You have blood in your urine or stool, or you vomit blood. Summary  After the procedure, it is common to have a sore throat or nausea. It is also common to feel tired.  Have a responsible adult stay with you for the first 24 hours after general anesthesia. It is important to have someone help care for you until you are awake and alert.  When you feel hungry, start by eating small amounts of foods that are soft and easy to digest (bland), such as toast. Gradually return to your regular diet.  Drink enough fluid to keep your urine pale yellow.  Return to your normal activities as told by your health care provider. Ask your health care provider what activities are safe for you. This information is not intended to replace advice given to you by your health care provider. Make sure you discuss any questions you have with your health care provider. Document Revised: 10/19/2017 Document Reviewed: 06/01/2017 Elsevier Patient Education  [place]. How to Use Chlorhexidine for Bathing Chlorhexidine gluconate (CHG) is a germ-killing (antiseptic) solution that is used to clean the skin. It can get rid of the bacteria that normally live on the skin and can keep them away for about 24 hours. To clean your skin with CHG, you may be given:  A CHG solution to use in the shower or as part of a sponge bath.  A prepackaged cloth that contains CHG. Cleaning your skin with CHG may help lower the risk for infection:  While you are staying in the intensive care unit of the hospital.  If you have a  vascular access, such as a central line, to provide short-term or long-term access to your veins.  If you have a catheter to drain urine from your bladder.  If you are on a ventilator. A ventilator is a machine that helps you breathe by moving air in and out of your lungs.  After surgery. What are the risks? Risks of using CHG include:  A skin reaction.  Hearing loss, if CHG gets in your ears.  Eye injury, if CHG gets in your eyes and is not rinsed out.  The CHG product catching fire. Make sure that you avoid smoking and flames after applying CHG to your skin. Do not use CHG:  If you have a chlorhexidine allergy or have previously reacted to chlorhexidine.  On babies younger than 43 months of age. How to use CHG solution  Use CHG only as told by your health care provider, and follow the instructions on the label.  Use the full amount of CHG as directed. Usually, this is one bottle. During a shower Follow these steps when using CHG solution during a shower (unless your health care provider gives you different instructions): 1. Start the shower. 2. Use your normal soap and shampoo to wash your face and hair. 3. Turn off the shower or move out of the shower stream. 4. Pour the CHG onto a clean washcloth. Do not use any type of brush or rough-edged sponge. 5. Starting at your neck, lather your body down to your toes. Make sure you follow these instructions: ? If you will be having surgery, pay special attention to the part of your body where you will be having surgery. Scrub this area for at least 1 minute. ? Do not use CHG on your head or face. If the solution gets into your  ears or eyes, rinse them well with water. ? Avoid your genital area. ? Avoid any areas of skin that have broken skin, cuts, or scrapes. ? Scrub your back and under your arms. Make sure to wash skin folds. 6. Let the lather sit on your skin for 1-2 minutes or as long as told by your health care  provider. 7. Thoroughly rinse your entire body in the shower. Make sure that all body creases and crevices are rinsed well. 8. Dry off with a clean towel. Do not put any substances on your body afterward--such as powder, lotion, or perfume--unless you are told to do so by your health care provider. Only use lotions that are recommended by the manufacturer. 9. Put on clean clothes or pajamas. 10. If it is the night before your surgery, sleep in clean sheets.  During a sponge bath Follow these steps when using CHG solution during a sponge bath (unless your health care provider gives you different instructions): 1. Use your normal soap and shampoo to wash your face and hair. 2. Pour the CHG onto a clean washcloth. 3. Starting at your neck, lather your body down to your toes. Make sure you follow these instructions: ? If you will be having surgery, pay special attention to the part of your body where you will be having surgery. Scrub this area for at least 1 minute. ? Do not use CHG on your head or face. If the solution gets into your ears or eyes, rinse them well with water. ? Avoid your genital area. ? Avoid any areas of skin that have broken skin, cuts, or scrapes. ? Scrub your back and under your arms. Make sure to wash skin folds. 4. Let the lather sit on your skin for 1-2 minutes or as long as told by your health care provider. 5. Using a different clean, wet washcloth, thoroughly rinse your entire body. Make sure that all body creases and crevices are rinsed well. 6. Dry off with a clean towel. Do not put any substances on your body afterward--such as powder, lotion, or perfume--unless you are told to do so by your health care provider. Only use lotions that are recommended by the manufacturer. 7. Put on clean clothes or pajamas. 8. If it is the night before your surgery, sleep in clean sheets. How to use CHG prepackaged cloths  Only use CHG cloths as told by your health care provider, and  follow the instructions on the label.  Use the CHG cloth on clean, dry skin.  Do not use the CHG cloth on your head or face unless your health care provider tells you to.  When washing with the CHG cloth: ? Avoid your genital area. ? Avoid any areas of skin that have broken skin, cuts, or scrapes. Before surgery Follow these steps when using a CHG cloth to clean before surgery (unless your health care provider gives you different instructions): 1. Using the CHG cloth, vigorously scrub the part of your body where you will be having surgery. Scrub using a back-and-forth motion for 3 minutes. The area on your body should be completely wet with CHG when you are done scrubbing. 2. Do not rinse. Discard the cloth and let the area air-dry. Do not put any substances on the area afterward, such as powder, lotion, or perfume. 3. Put on clean clothes or pajamas. 4. If it is the night before your surgery, sleep in clean sheets.  For general bathing Follow these steps when using CHG  cloths for general bathing (unless your health care provider gives you different instructions). 1. Use a separate CHG cloth for each area of your body. Make sure you wash between any folds of skin and between your fingers and toes. Wash your body in the following order, switching to a new cloth after each step: ? The front of your neck, shoulders, and chest. ? Both of your arms, under your arms, and your hands. ? Your stomach and groin area, avoiding the genitals. ? Your right leg and foot. ? Your left leg and foot. ? The back of your neck, your back, and your buttocks. 2. Do not rinse. Discard the cloth and let the area air-dry. Do not put any substances on your body afterward--such as powder, lotion, or perfume--unless you are told to do so by your health care provider. Only use lotions that are recommended by the manufacturer. 3. Put on clean clothes or pajamas. Contact a health care provider if:  Your skin gets  irritated after scrubbing.  You have questions about using your solution or cloth. Get help right away if:  Your eyes become very red or swollen.  Your eyes itch badly.  Your skin itches badly and is red or swollen.  Your hearing changes.  You have trouble seeing.  You have swelling or tingling in your mouth or throat.  You have trouble breathing.  You swallow any chlorhexidine. Summary  Chlorhexidine gluconate (CHG) is a germ-killing (antiseptic) solution that is used to clean the skin. Cleaning your skin with CHG may help to lower your risk for infection.  You may be given CHG to use for bathing. It may be in a bottle or in a prepackaged cloth to use on your skin. Carefully follow your health care provider's instructions and the instructions on the product label.  Do not use CHG if you have a chlorhexidine allergy.  Contact your health care provider if your skin gets irritated after scrubbing. This information is not intended to replace advice given to you by your health care provider. Make sure you discuss any questions you have with your health care provider. Document Revised: 01/02/2019 Document Reviewed: 09/13/2017 Elsevier Patient Education  [place].

## 2019-11-06 ENCOUNTER — Other Ambulatory Visit: Payer: Self-pay

## 2019-11-06 ENCOUNTER — Encounter (HOSPITAL_COMMUNITY): Payer: Self-pay

## 2019-11-06 ENCOUNTER — Encounter (HOSPITAL_COMMUNITY)
Admission: RE | Admit: 2019-11-06 | Discharge: 2019-11-06 | Disposition: A | Discharge status: Home / Self Care | Payer: Medicare Other | Source: Ambulatory Visit | Attending: Obstetrics and Gynecology | Admitting: Obstetrics and Gynecology

## 2019-11-06 DIAGNOSIS — Z01810 Encounter for preprocedural cardiovascular examination: Secondary | ICD-10-CM | POA: Insufficient documentation

## 2019-11-06 DIAGNOSIS — Z01812 Encounter for preprocedural laboratory examination: Secondary | ICD-10-CM | POA: Diagnosis not present

## 2019-11-06 HISTORY — DX: Other specified postprocedural states: Z98.890

## 2019-11-06 HISTORY — DX: Personal history of urinary calculi: Z87.442

## 2019-11-06 HISTORY — DX: Nausea with vomiting, unspecified: R11.2

## 2019-11-06 HISTORY — DX: Nausea with vomiting, unspecified: Z98.890

## 2019-11-06 LAB — URINALYSIS, ROUTINE W REFLEX MICROSCOPIC
Bilirubin Urine: NEGATIVE
Glucose, UA: NEGATIVE mg/dL
Hgb urine dipstick: NEGATIVE
Ketones, ur: NEGATIVE mg/dL
Nitrite: NEGATIVE
Protein, ur: NEGATIVE mg/dL
Specific Gravity, Urine: 1.009 (ref 1.005–1.030)
pH: 6 (ref 5.0–8.0)

## 2019-11-06 LAB — CBC
HCT: 35.6 % — ABNORMAL LOW (ref 36.0–46.0)
Hemoglobin: 12.4 g/dL (ref 12.0–15.0)
MCH: 40.4 pg — ABNORMAL HIGH (ref 26.0–34.0)
MCHC: 34.8 g/dL (ref 30.0–36.0)
MCV: 116 fL — ABNORMAL HIGH (ref 80.0–100.0)
Platelets: 282 10*3/uL (ref 150–400)
RBC: 3.07 MIL/uL — ABNORMAL LOW (ref 3.87–5.11)
RDW: 12 % (ref 11.5–15.5)
WBC: 5.4 10*3/uL (ref 4.0–10.5)
nRBC: 0 % (ref 0.0–0.2)

## 2019-11-06 LAB — COMPREHENSIVE METABOLIC PANEL
ALT: 16 U/L (ref 0–44)
AST: 14 U/L — ABNORMAL LOW (ref 15–41)
Albumin: 4.1 g/dL (ref 3.5–5.0)
Alkaline Phosphatase: 96 U/L (ref 38–126)
Anion gap: 10 (ref 5–15)
BUN: 18 mg/dL (ref 8–23)
CO2: 28 mmol/L (ref 22–32)
Calcium: 9.2 mg/dL (ref 8.9–10.3)
Chloride: 101 mmol/L (ref 98–111)
Creatinine, Ser: 0.84 mg/dL (ref 0.44–1.00)
GFR calc Af Amer: >60 mL/min (ref >60)
GFR calc non Af Amer: >60 mL/min (ref >60)
Glucose, Bld: 124 mg/dL — ABNORMAL HIGH (ref 70–99)
Potassium: 4.5 mmol/L (ref 3.5–5.1)
Sodium: 139 mmol/L (ref 135–145)
Total Bilirubin: 0.7 mg/dL (ref 0.3–1.2)
Total Protein: 6.9 g/dL (ref 6.5–8.1)

## 2019-11-09 ENCOUNTER — Other Ambulatory Visit: Payer: Self-pay | Admitting: Obstetrics and Gynecology

## 2019-11-10 ENCOUNTER — Other Ambulatory Visit (HOSPITAL_COMMUNITY)
Admission: RE | Admit: 2019-11-10 | Discharge: 2019-11-10 | Disposition: A | Discharge status: Home / Self Care | Payer: Medicare Other | Source: Ambulatory Visit | Attending: Obstetrics and Gynecology | Admitting: Obstetrics and Gynecology

## 2019-11-10 ENCOUNTER — Other Ambulatory Visit: Payer: Self-pay

## 2019-11-10 DIAGNOSIS — Z01812 Encounter for preprocedural laboratory examination: Secondary | ICD-10-CM | POA: Diagnosis present

## 2019-11-10 DIAGNOSIS — Z20822 Contact with and (suspected) exposure to covid-19: Secondary | ICD-10-CM | POA: Diagnosis not present

## 2019-11-10 LAB — SARS CORONAVIRUS 2 (TAT 6-24 HRS): SARS Coronavirus 2: NEGATIVE

## 2019-11-10 MED ORDER — CEFAZOLIN SODIUM-DEXTROSE 2-4 GM/100ML-% IV SOLN: 2.0000 g | Freq: Once | INTRAVENOUS | Status: DC

## 2019-11-11 ENCOUNTER — Ambulatory Visit (HOSPITAL_COMMUNITY)
Admission: RE | Admit: 2019-11-11 | Discharge: 2019-11-11 | Disposition: A | Discharge status: Home / Self Care | Payer: Medicare Other | Attending: Obstetrics and Gynecology | Admitting: Obstetrics and Gynecology

## 2019-11-11 ENCOUNTER — Ambulatory Visit (HOSPITAL_COMMUNITY): Payer: Medicare Other | Admitting: Anesthesiology

## 2019-11-11 ENCOUNTER — Other Ambulatory Visit: Payer: Self-pay

## 2019-11-11 ENCOUNTER — Encounter (HOSPITAL_COMMUNITY)
Admission: RE | Disposition: A | Discharge status: Home / Self Care | Payer: Self-pay | Source: Home / Self Care | Attending: Obstetrics and Gynecology

## 2019-11-11 ENCOUNTER — Encounter: Payer: Medicare Other | Admitting: Obstetrics and Gynecology

## 2019-11-11 ENCOUNTER — Encounter (HOSPITAL_COMMUNITY): Payer: Self-pay | Admitting: Obstetrics and Gynecology

## 2019-11-11 DIAGNOSIS — I10 Essential (primary) hypertension: Secondary | ICD-10-CM | POA: Insufficient documentation

## 2019-11-11 DIAGNOSIS — N950 Postmenopausal bleeding: Secondary | ICD-10-CM | POA: Insufficient documentation

## 2019-11-11 DIAGNOSIS — E119 Type 2 diabetes mellitus without complications: Secondary | ICD-10-CM | POA: Diagnosis not present

## 2019-11-11 DIAGNOSIS — C541 Malignant neoplasm of endometrium: Secondary | ICD-10-CM | POA: Diagnosis not present

## 2019-11-11 DIAGNOSIS — E785 Hyperlipidemia, unspecified: Secondary | ICD-10-CM | POA: Diagnosis not present

## 2019-11-11 DIAGNOSIS — K219 Gastro-esophageal reflux disease without esophagitis: Secondary | ICD-10-CM | POA: Insufficient documentation

## 2019-11-11 DIAGNOSIS — I471 Supraventricular tachycardia: Secondary | ICD-10-CM | POA: Diagnosis not present

## 2019-11-11 DIAGNOSIS — N840 Polyp of corpus uteri: Secondary | ICD-10-CM | POA: Diagnosis not present

## 2019-11-11 DIAGNOSIS — Z79899 Other long term (current) drug therapy: Secondary | ICD-10-CM | POA: Diagnosis not present

## 2019-11-11 DIAGNOSIS — Z7982 Long term (current) use of aspirin: Secondary | ICD-10-CM | POA: Insufficient documentation

## 2019-11-11 HISTORY — PX: HYSTEROSCOPY WITH D & C: SHX1775

## 2019-11-11 HISTORY — PX: POLYPECTOMY: SHX5525

## 2019-11-11 SURGERY — DILATATION AND CURETTAGE /HYSTEROSCOPY
Anesthesia: General

## 2019-11-11 MED ORDER — MIDAZOLAM HCL 2 MG/2ML IJ SOLN: 0.5000 mg | Freq: Once | INTRAMUSCULAR | Status: DC | PRN

## 2019-11-11 MED ORDER — LACTATED RINGERS IV SOLN
INTRAVENOUS | Status: DC
  Administered 2019-11-11: 1000 mL via INTRAVENOUS

## 2019-11-11 MED ORDER — CEFAZOLIN SODIUM-DEXTROSE 2-4 GM/100ML-% IV SOLN
INTRAVENOUS | Status: AC
  Filled 2019-11-11: qty 100

## 2019-11-11 MED ORDER — LIDOCAINE HCL (CARDIAC) PF 100 MG/5ML IV SOSY
PREFILLED_SYRINGE | INTRAVENOUS | Status: DC | PRN
  Administered 2019-11-11: 50 mg via INTRAVENOUS

## 2019-11-11 MED ORDER — BUPIVACAINE-EPINEPHRINE (PF) 0.5% -1:200000 IJ SOLN
INTRAMUSCULAR | Status: DC | PRN
  Administered 2019-11-11: 20 mL

## 2019-11-11 MED ORDER — DEXAMETHASONE SODIUM PHOSPHATE 4 MG/ML IJ SOLN
INTRAMUSCULAR | Status: DC | PRN
  Administered 2019-11-11: 8 mg via INTRAVENOUS

## 2019-11-11 MED ORDER — PROMETHAZINE HCL 25 MG/ML IJ SOLN: 6.2500 mg | INTRAMUSCULAR | Status: DC | PRN

## 2019-11-11 MED ORDER — HYDROMORPHONE HCL 1 MG/ML IJ SOLN: 0.2500 mg | INTRAMUSCULAR | Status: DC | PRN

## 2019-11-11 MED ORDER — LACTATED RINGERS IV SOLN: INTRAVENOUS | Status: DC

## 2019-11-11 MED ORDER — PROPOFOL 10 MG/ML IV BOLUS
INTRAVENOUS | Status: AC
  Filled 2019-11-11: qty 20

## 2019-11-11 MED ORDER — CEFAZOLIN SODIUM-DEXTROSE 2-4 GM/100ML-% IV SOLN
2.0000 g | Freq: Once | INTRAVENOUS | Status: AC
  Administered 2019-11-11: 2 g via INTRAVENOUS

## 2019-11-11 MED ORDER — ONDANSETRON HCL 4 MG/2ML IJ SOLN
INTRAMUSCULAR | Status: DC | PRN
  Administered 2019-11-11: 4 mg via INTRAVENOUS

## 2019-11-11 MED ORDER — BUPIVACAINE-EPINEPHRINE (PF) 0.5% -1:200000 IJ SOLN
INTRAMUSCULAR | Status: AC
  Filled 2019-11-11: qty 30

## 2019-11-11 MED ORDER — LIDOCAINE 2% (20 MG/ML) 5 ML SYRINGE
INTRAMUSCULAR | Status: AC
  Filled 2019-11-11: qty 5

## 2019-11-11 MED ORDER — SODIUM CHLORIDE 0.9 % IR SOLN
Status: DC | PRN
  Administered 2019-11-11: 3000 mL

## 2019-11-11 MED ORDER — SEVOFLURANE IN SOLN
RESPIRATORY_TRACT | Status: AC
  Filled 2019-11-11: qty 250

## 2019-11-11 MED ORDER — PROPOFOL 10 MG/ML IV BOLUS
INTRAVENOUS | Status: DC | PRN
  Administered 2019-11-11: 130 mg via INTRAVENOUS

## 2019-11-11 MED ORDER — FENTANYL CITRATE (PF) 100 MCG/2ML IJ SOLN
INTRAMUSCULAR | Status: DC | PRN
  Administered 2019-11-11 (×4): 25 ug via INTRAVENOUS

## 2019-11-11 MED ORDER — FENTANYL CITRATE (PF) 250 MCG/5ML IJ SOLN
INTRAMUSCULAR | Status: AC
  Filled 2019-11-11: qty 5

## 2019-11-11 SURGICAL SUPPLY — 23 items
CLOTH BEACON ORANGE TIMEOUT ST (SAFETY) ×2 IMPLANT
COVER LIGHT HANDLE STERIS (MISCELLANEOUS) ×4 IMPLANT
COVER WAND RF STERILE (DRAPES) ×2 IMPLANT
DECANTER SPIKE VIAL GLASS SM (MISCELLANEOUS) ×2 IMPLANT
GAUZE 4X4 16PLY RFD (DISPOSABLE) ×2 IMPLANT
GLOVE BIOGEL PI IND STRL 7.0 (GLOVE) ×2 IMPLANT
GLOVE BIOGEL PI IND STRL 9 (GLOVE) ×1 IMPLANT
GLOVE BIOGEL PI INDICATOR 7.0 (GLOVE) ×2
GLOVE BIOGEL PI INDICATOR 9 (GLOVE) ×1
GLOVE ECLIPSE 9.0 STRL (GLOVE) ×2 IMPLANT
GOWN SPEC L3 XXLG W/TWL (GOWN DISPOSABLE) ×2 IMPLANT
GOWN STRL REUS W/TWL LRG LVL3 (GOWN DISPOSABLE) ×2 IMPLANT
INST SET HYSTEROSCOPY (KITS) ×2 IMPLANT
IV NS IRRIG 3000ML ARTHROMATIC (IV SOLUTION) ×2 IMPLANT
KIT TURNOVER KIT A (KITS) ×2 IMPLANT
MANIFOLD NEPTUNE II (INSTRUMENTS) ×2 IMPLANT
NS IRRIG 1000ML POUR BTL (IV SOLUTION) ×2 IMPLANT
PACK PERI GYN (CUSTOM PROCEDURE TRAY) ×2 IMPLANT
PAD ARMBOARD 7.5X6 YLW CONV (MISCELLANEOUS) ×2 IMPLANT
PAD TELFA 3X4 1S STER (GAUZE/BANDAGES/DRESSINGS) ×2 IMPLANT
SET BASIN LINEN APH (SET/KITS/TRAYS/PACK) ×2 IMPLANT
SET CYSTO W/LG BORE CLAMP LF (SET/KITS/TRAYS/PACK) ×2 IMPLANT
SYR CONTROL 10ML LL (SYRINGE) ×2 IMPLANT

## 2019-11-11 NOTE — Anesthesia Procedure Notes (Signed)
Procedure Name: LMA Insertion Date/Time: 11/11/2019 9:33 AM Performed by: Georgeanne Nim, CRNA Pre-anesthesia Checklist: Patient identified, Emergency Drugs available, Suction available, Patient being monitored and Timeout performed Patient Re-evaluated:Patient Re-evaluated prior to induction Oxygen Delivery Method: Circle system utilized Preoxygenation: Pre-oxygenation with 100% oxygen Induction Type: IV induction LMA: LMA inserted LMA Size: 4.0 Number of attempts: 1 Placement Confirmation: positive ETCO2,  CO2 detector and breath sounds checked- equal and bilateral Tube secured with: Tape Dental Injury: Teeth and Oropharynx as per pre-operative assessment

## 2019-11-11 NOTE — Transfer of Care (Signed)
Immediate Anesthesia Transfer of Care Note  Patient: Glendean Oneill Linderman  Procedure(s) Performed: DILATATION AND CURETTAGE /HYSTEROSCOPY (N/A ) POLYPECTOMY(ENDOMETRIAL POLYP) (N/A )  Patient Location: PACU  Anesthesia Type:General  Level of Consciousness: awake, alert , oriented and patient cooperative  Airway & Oxygen Therapy: Patient Spontanous Breathing  Post-op Assessment: Report given to RN and Post -op Vital signs reviewed and stable  Post vital signs: Reviewed and stable  Last Vitals:  Vitals Value Taken Time  BP 134/54 11/11/19 1016  Temp    Pulse 85 11/11/19 1018  Resp 14 11/11/19 1018  SpO2 92 % 11/11/19 1018  Vitals shown include unvalidated device data.  Last Pain:  Vitals:   11/11/19 0901  PainSc: 0-No pain      Patients Stated Pain Goal: 8 ([id number])  Complications: No apparent anesthesia complications

## 2019-11-11 NOTE — Op Note (Signed)
11/11/2019  10:13 AM  PATIENT:  Heather Barber  74 y.o. female  PRE-OPERATIVE DIAGNOSIS:  Postmenopausal bleeding; Endometrial Polyp  POST-OPERATIVE DIAGNOSIS:  Postmenopausal bleeding; Endometrial Polyp  PROCEDURE:  Procedure(s): DILATATION AND CURETTAGE /HYSTEROSCOPY (N/A) POLYPECTOMY(ENDOMETRIAL POLYP) (N/A)  SURGEON:  Surgeon(s) and Role:    [name], MD - Primary  PHYSICIAN ASSISTANT:   ASSISTANTS: Zoila Shutter, CST  ANESTHESIA:   general and paracervical block  EBL:  10 mL   BLOOD ADMINISTERED:none  DRAINS: none   LOCAL MEDICATIONS USED:  MARCAINE    and Amount: 20 ml  SPECIMEN:  Source of Specimen:  Polyp, endometrial curettings  DISPOSITION OF SPECIMEN:  PATHOLOGY  COUNTS:  YES  TOURNIQUET:  * No tourniquets in log *  DICTATION: .Dragon Dictation  PLAN OF CARE: Discharge to home after PACU  PATIENT DISPOSITION:  PACU - hemodynamically stable.   Delay start of Pharmacological VTE agent (>24hrs) due to surgical blood loss or risk of bleeding: not applicable Details of procedure: Patient was taken the operating room prepped and draped for vaginal procedure with timeout conducted.  Ancef was administered.  Speculum was inserted, cervix grasped with single-tooth tenaculum and paracervical block applied with 20 cc of Marcaine 1% with epinephrine.  The uterus was sounded to 9 cm in the mid plane, dilated to 23 [place] allowing introduction of the 30 degree operative hysteroscope which was difficult to get the usual level of clarity with the pictures. We were able to see sufficiently to identify a polyp and shaggy bit of endometrial tissue.  Randall stone forceps were used to probe the uterine cavity and fragments of the polyp were pulled out.  It was a fragile polyp and that came out in several small pieces.  Repeat hysteroscopy was performed and the endometrial cavity was smooth.  This process was repeated a couple of times to ensure adequacy of evacuation, and  all the small tissue fragments that could be identified were collected.  There was no sign of complication during the procedure. Patient was allowed to go to recovery room in stable condition sponge and needle counts correct, And confirmed.

## 2019-11-11 NOTE — Op Note (Signed)
Procedure(s): DILATATION AND CURETTAGE /HYSTEROSCOPY (N/A) POLYPECTOMY(ENDOMETRIAL POLYP) (N/A)

## 2019-11-11 NOTE — Anesthesia Preprocedure Evaluation (Signed)
Anesthesia Evaluation  Patient identified by MRN, date of birth, ID band Patient awake    Reviewed: Allergy & Precautions, NPO status , Patient's Chart, lab work & pertinent test results  History of Anesthesia Complications (+) PONV and history of anesthetic complications  Airway Mallampati: II  TM Distance: >3 FB Neck ROM: Full    Dental no notable dental hx. (+) Edentulous Upper   Pulmonary neg pulmonary ROS,    Pulmonary exam normal breath sounds clear to auscultation       Cardiovascular Exercise Tolerance: Good hypertension, Pt. on medications and Pt. on home beta blockers Normal cardiovascular exam+ dysrhythmias Supra Ventricular Tachycardia I Rhythm:Regular Rate:Normal  Reports 1.5 mile ET Denies CP H/o SVT improved with increased toprol  S/p ASD repair remotely  Last Echo normal EF Mod MR Last ECG old LBBB, primary AVB   Neuro/Psych negative neurological ROS  negative psych ROS   GI/Hepatic Neg liver ROS, GERD  Medicated and Controlled,  Endo/Other  negative endocrine ROSdiabetes, Type 2  Renal/GU negative Renal ROS  negative genitourinary   Musculoskeletal negative musculoskeletal ROS (+)   Abdominal   Peds negative pediatric ROS (+)  Hematology negative hematology ROS (+) On hydroxyurea to decrease plt stickyness   Anesthesia Other Findings   Reproductive/Obstetrics negative OB ROS                             Anesthesia Physical Anesthesia Plan  ASA: III  Anesthesia Plan: General   Post-op Pain Management:    Induction: Intravenous  PONV Risk Score and Plan: 4 or greater and Ondansetron, Dexamethasone, Promethazine, Treatment may vary due to age or medical condition and Midazolam  Airway Management Planned: LMA  Additional Equipment:   Intra-op Plan:   Post-operative Plan:   Informed Consent: I have reviewed the patients History and Physical, chart, labs and  discussed the procedure including the risks, benefits and alternatives for the proposed anesthesia with the patient or authorized representative who has indicated his/her understanding and acceptance.     Dental advisory given  Plan Discussed with: CRNA  Anesthesia Plan Comments: (Plan Full PPE use Plan GA(LMA) vs. GETA D/W PT -WTP with same after Q&A)        Anesthesia Quick Evaluation

## 2019-11-11 NOTE — Anesthesia Postprocedure Evaluation (Signed)
Anesthesia Post Note  Patient: Heather Barber  Procedure(s) Performed: DILATATION AND CURETTAGE /HYSTEROSCOPY (N/A ) POLYPECTOMY(ENDOMETRIAL POLYP) (N/A )  Patient location during evaluation: PACU Anesthesia Type: General Level of consciousness: awake and alert Pain management: pain level controlled Vital Signs Assessment: post-procedure vital signs reviewed and stable Respiratory status: spontaneous breathing Cardiovascular status: stable Postop Assessment: no apparent nausea or vomiting Anesthetic complications: no     Last Vitals:  Vitals:   11/11/19 1043 11/11/19 1045  BP: (!) 124/55 (!) 124/55  Pulse: 78 79  Resp: 12 13  Temp:    SpO2: 93% 91%    Last Pain:  Vitals:   11/11/19 1045  PainSc: 0-No pain                 Everette Rank

## 2019-11-11 NOTE — H&P (Signed)
Preoperative History and Physical  Heather Barber is a 74 y.o. [phone] here for surgical management of PMB and endometrial polyp. No significant preoperative concerns. Pt is wondering if she can have the surgery before the end of the year instead of January because of her insurance.copays  Proposed surgery: D&C/HYSTEROSCOPY, removal of endometial polyp scheduled for 11/10/2018,will try to change to Dec 29      Past Medical History:  Diagnosis Date  . Atrial septal defect    Surgical repair in 1994  . Chest pain    Normal coronary angiography in 1994 and 2011  . Dizziness   . Essential thrombocythemia ([place]) 06/06/2016  . GERD (gastroesophageal reflux disease)   . Hyperlipidemia   . Hypertension   . LBBB (left bundle branch block) 2011  . Palpitations   . Syncope   . Type 2 diabetes mellitus with other specified complication ([place]) 123456  . Ventricular tachycardia ([place])    Exercise induced        Past Surgical History:  Procedure Laterality Date  . ASD [place] EXCISIONAL BIOPSY     Right and left  . CATARACT EXTRACTION W/PHACO Left 03/11/2013   Procedure: CATARACT EXTRACTION PHACO AND INTRAOCULAR LENS PLACEMENT (IOC); Surgeon: [name], MD; Location: [facility]; Service: Ophthalmology; Laterality: Left; CDE: 12.20  . TONSILLECTOMY    . TUBAL LIGATION     Bilateral                   OB History  Gravida Para Term Preterm AB Living  4 4 4   3   SAB TAB Ectopic Multiple Live Births                # Outcome Date GA Lbr Len/2nd Weight Sex Delivery Anes PTL Lv  4 Term           3 Term           2 Term           1 Term           Patient denies any other pertinent gynecologic issues.        Current Outpatient Medications on File Prior to Visit  Medication Sig Dispense Refill  . aspirin EC 81 MG tablet Take 81 mg by mouth daily.    Marland Kitchen atorvastatin (LIPITOR) 20 MG tablet Take 1 tablet (20 mg total) by mouth at bedtime. 90 tablet 1  .  furosemide (LASIX) 20 MG tablet TAKE 1 TABLET (20 MG TOTAL) BY MOUTH DAILY. 90 tablet 1  . hydroxyurea (HYDREA) 500 MG capsule Take 500 mg by mouth daily. May take with food to minimize GI side effects.    Marland Kitchen LORazepam (ATIVAN) 0.5 MG tablet Take 0.5 mg by mouth every 8 (eight) hours.    Marland Kitchen losartan (COZAAR) 50 MG tablet Take 1 tablet (50 mg total) by mouth daily. 90 tablet 3  . metoprolol succinate (TOPROL-XL) 50 MG 24 hr tablet Take 1 tablet (50 mg total) by mouth 2 (two) times a day. Take with or immediately following a meal. 180 tablet 3  . naproxen sodium (ALEVE) 220 MG tablet Take 440 mg by mouth 2 (two) times daily as needed (pain/headache).    Marland Kitchen omeprazole (PRILOSEC) 20 MG capsule Take 1 capsule (20 mg total) by mouth daily. 90 capsule 2   No current facility-administered medications on file prior to visit.  Allergies  Allergen Reactions  . Codeine Nausea And Vomiting  . Morphine Other (See Comments)    GI symptoms   Social History: reports that she has never smoked. She has never used smokeless tobacco. She reports that she does not drink alcohol or use drugs.       Family History  Problem Relation Age of Onset  . Cancer Mother    female  . Stroke Sister   . Cancer Sister    breast  . Heart failure Father   . Coronary artery disease Neg Hx    Review of Systems: Noncontributory  PHYSICAL EXAM:  Blood pressure (!) 148/81, pulse 85, height 5\' 4"  (1.626 m), weight 173 lb (78.5 kg).  General appearance - alert, well appearing, and in no distress  Chest - clear to auscultation, no wheezes, rales or rhonchi, symmetric air entry  Heart - normal rate and regular rhythm  Abdomen - soft, nontender, nondistended, no masses or organomegaly  Pelvic -  External genitalia: several benign appearing skin tags  Cervix: light bleeding  Uterus: anterior  Vagina: atrophic tissues  Extremities - peripheral pulses normal, no pedal edema, no clubbing or cyanosis  Labs:       Results  for orders placed or performed in visit on 10/07/19 (from the past 336 hour(s))  CBC with Differential   Collection Time: 10/07/19 1:01 PM  Result Value Ref Range   WBC 4.8 4.0 - 10.5 K/uL   RBC 2.92 (L) 3.87 - 5.11 MIL/uL   Hemoglobin 11.8 (L) 12.0 - 15.0 g/dL   HCT 34.3 (L) 36.0 - 46.0 %   MCV 117.5 (H) 80.0 - 100.0 fL   MCH 40.4 (H) 26.0 - 34.0 pg   MCHC 34.4 30.0 - 36.0 g/dL   RDW 12.2 11.5 - 15.5 %   Platelets 258 150 - 400 K/uL   nRBC 0.0 0.0 - 0.2 %   Neutrophils Relative % 45 %   Neutro Abs 2.2 1.7 - 7.7 K/uL   Lymphocytes Relative 44 %   Lymphs Abs 2.1 0.7 - 4.0 K/uL   Monocytes Relative 9 %   Monocytes Absolute 0.4 0.1 - 1.0 K/uL   Eosinophils Relative 2 %   Eosinophils Absolute 0.1 0.0 - 0.5 K/uL   Basophils Relative 0 %   Basophils Absolute 0.0 0.0 - 0.1 K/uL   RBC Morphology MACROCYTOSIS    Immature Granulocytes 0 %   Abs Immature Granulocytes 0.02 0.00 - 0.07 K/uL  Comprehensive metabolic panel   Collection Time: 10/07/19 1:01 PM  Result Value Ref Range   Sodium 141 135 - 145 mmol/L   Potassium 4.5 3.5 - 5.1 mmol/L   Chloride 106 98 - 111 mmol/L   CO2 26 22 - 32 mmol/L   Glucose, Bld 128 (H) 70 - 99 mg/dL   BUN 19 8 - 23 mg/dL   Creatinine, Ser 0.63 0.44 - 1.00 mg/dL   Calcium 9.2 8.9 - 10.3 mg/dL   Total Protein 6.9 6.5 - 8.1 g/dL   Albumin 4.0 3.5 - 5.0 g/dL   AST 16 15 - 41 U/L   ALT 18 0 - 44 U/L   Alkaline Phosphatase 86 38 - 126 U/L   Total Bilirubin 0.6 0.3 - 1.2 mg/dL   GFR calc non Af Amer >60 >60 mL/min   GFR calc Af Amer >60 >60 mL/min   Anion gap 9 5 - 15   CBC Latest Ref Rng & Units 11/06/2019 10/07/2019 07/18/2019  WBC 4.0 -  10.5 K/uL 5.4 4.8 5.6  Hemoglobin 12.0 - 15.0 g/dL 12.4 11.8(L) 13.5  Hematocrit 36.0 - 46.0 % 35.6(L) 34.3(L) 36.8  Platelets 150 - 400 K/uL 282 258 290   CMP Latest Ref Rng & Units 11/06/2019 10/07/2019 07/18/2019  Glucose 70 - 99 mg/dL 124(H) 128(H) 98  BUN 8 - 23 mg/dL 18 19 13   Creatinine 0.44 - 1.00 mg/dL 0.84  0.63 0.75  Sodium 135 - 145 mmol/L 139 141 140  Potassium 3.5 - 5.1 mmol/L 4.5 4.5 4.8  Chloride 98 - 111 mmol/L 101 106 100  CO2 22 - 32 mmol/L 28 26 25   Calcium 8.9 - 10.3 mg/dL 9.2 9.2 10.3  Total Protein 6.5 - 8.1 g/dL 6.9 6.9 6.8  Total Bilirubin 0.3 - 1.2 mg/dL 0.7 0.6 0.3  Alkaline Phos 38 - 126 U/L 96 86 113  AST 15 - 41 U/L 14(L) 16 11  ALT 0 - 44 U/L 16 18 15     Imaging Studies:  Imaging Results    Assessment:      Patient Active Problem List   Diagnosis Date Noted  . Type 2 diabetes mellitus with other specified complication ([place]) 123456  . SVT (supraventricular tachycardia) ([place]) 05/21/2019  . Chest pain 12/07/2017  . Gastroesophageal reflux disease 07/24/2016  . Thrombocytosis ([place]) 06/06/2016  . Breast thickening right , with negative mammogram. 09/27/2015  . LBBB (left bundle branch block)   . Ventricular tachycardia ([place])   . ASD (atrial septal defect)   . Hypertension   . Hyperlipidemia 04/27/2010   Plan:  1. Explained the treatment plan to the pt's daughter by phone  2. Patient will undergo surgical management with D&C/HYSTEROSCOPY scheduled for 11/10/2018.

## 2019-11-12 ENCOUNTER — Telehealth: Payer: Self-pay | Admitting: Obstetrics and Gynecology

## 2019-11-12 ENCOUNTER — Encounter: Payer: Self-pay | Admitting: Obstetrics and Gynecology

## 2019-11-12 ENCOUNTER — Telehealth: Payer: Self-pay | Admitting: *Deleted

## 2019-11-12 LAB — SURGICAL PATHOLOGY

## 2019-11-12 NOTE — Telephone Encounter (Signed)
Patient informed of pathology results : Well Differentiated endometrial cancer. Pt aware of plans to refer to dr Denman George.and associates. Pt asks that I call her Daughter. Message and number left with Daughter's Cell number., and she has called back. Gyn Oncology has received the patient's information.

## 2019-11-12 NOTE — Telephone Encounter (Signed)
Called and left the patient a message to call the office back. Need to scheduled the patient for a new patient appt

## 2019-11-13 ENCOUNTER — Telehealth: Payer: Self-pay | Admitting: *Deleted

## 2019-11-13 ENCOUNTER — Ambulatory Visit: Payer: Medicare Other | Admitting: Cardiology

## 2019-11-13 NOTE — Telephone Encounter (Signed)
Called and spoke with the patient, scheduled the new patient appt for 1/19.

## 2019-11-17 ENCOUNTER — Telehealth: Payer: Self-pay | Admitting: Cardiology

## 2019-11-17 ENCOUNTER — Other Ambulatory Visit: Payer: Self-pay | Admitting: Obstetrics and Gynecology

## 2019-11-17 ENCOUNTER — Telehealth (INDEPENDENT_AMBULATORY_CARE_PROVIDER_SITE_OTHER): Payer: Medicare Other | Admitting: Cardiology

## 2019-11-17 ENCOUNTER — Encounter: Payer: Self-pay | Admitting: Cardiology

## 2019-11-17 VITALS — BP 143/60 | HR 72 | Ht 74.0 in | Wt 175.0 lb

## 2019-11-17 DIAGNOSIS — R002 Palpitations: Secondary | ICD-10-CM

## 2019-11-17 NOTE — Progress Notes (Signed)
Virtual Visit via Telephone Note   This visit type was conducted due to national recommendations for restrictions regarding the COVID-19 Pandemic (e.g. social distancing) in an effort to limit this patient's exposure and mitigate transmission in our community.  Due to her co-morbid illnesses, this patient is at least at moderate risk for complications without adequate follow up.  This format is felt to be most appropriate for this patient at this time.  The patient did not have access to video technology/had technical difficulties with video requiring transitioning to audio format only (telephone).  All issues noted in this document were discussed and addressed.  No physical exam could be performed with this format.  Please refer to the patient's chart for her  consent to telehealth for [facility].   Date:  11/17/2019   ID:  Heather Barber, DOB [birth year], MRN [id number]  Patient Location: Home Provider Location: Office  PCP:  Dettinger, Fransisca Kaufmann, MD  Cardiologist:  Carlyle Dolly, MD  Electrophysiologist:  None   Evaluation Performed:  Follow-Up Visit  Chief Complaint:  Follow up  History of Present Illness:    Heather Barber is a 74 y.o. female seen today for follow up of the following medical problems.   1. Palpitations  - 03/2019 event monitor with PSVT - no recent palpitations. Has done well with metoprolol    3. Ventricular tachycardia - remote history per notes, has had no recurrence - normal cath perviously, 1994 and 2011  - no recent symptoms  4. Chronic LBBB - no evidence of underlying heart disease by caths or echos   5. Uterine cancer - followed by ob/gyn  6. Hyperlipidemia  - on statin started by pcp   7. HTN - home bp's vary, recheck today 138/68 p 85    SH: works at [employer] place for [employer]      The patient does not have symptoms concerning for COVID-19 infection (fever, chills, cough, or new shortness of breath).     Past Medical History:  Diagnosis Date  . Atrial septal defect    Surgical repair in 1994  . Chest pain    Normal coronary angiography in 1994 and 2011  . Dizziness   . Endometrial cancer, grade I ([place]) 10/09/2019  . Essential thrombocythemia ([place]) 06/06/2016  . GERD (gastroesophageal reflux disease)   . History of kidney stones   . Hyperlipidemia   . Hypertension   . LBBB (left bundle [place] block) 2011  . Palpitations   . PONV (postoperative nausea and vomiting)   . Syncope   . Type 2 diabetes mellitus with other specified complication ([place]) 123456  . Ventricular tachycardia ([place])    Exercise induced   Past Surgical History:  Procedure Laterality Date  . ASD [place] EXCISIONAL BIOPSY     Right and left  . CATARACT EXTRACTION W/PHACO Left 03/11/2013   Procedure: CATARACT EXTRACTION PHACO AND INTRAOCULAR LENS PLACEMENT (IOC);  Surgeon: [name], MD;  Location: [facility];  Service: Ophthalmology;  Laterality: Left;  CDE:  12.20  . HYSTEROSCOPY WITH D & C N/A 11/11/2019   Procedure: DILATATION AND CURETTAGE /HYSTEROSCOPY;  Surgeon: [name], MD;  Location: [facility];  Service: Gynecology;  Laterality: N/A;  . POLYPECTOMY N/A 11/11/2019   Procedure: POLYPECTOMY(ENDOMETRIAL POLYP);  Surgeon: [name], MD;  Location: [facility];  Service: Gynecology;  Laterality: N/A;  . TONSILLECTOMY    . TUBAL LIGATION  Bilateral     Current Meds  Medication Sig  . aspirin EC 81 MG tablet Take 81 mg by mouth daily.  Marland Kitchen atorvastatin (LIPITOR) 20 MG tablet Take 1 tablet (20 mg total) by mouth at bedtime.  . furosemide (LASIX) 20 MG tablet TAKE 1 TABLET (20 MG TOTAL) BY MOUTH DAILY. (Patient taking differently: Take 20 mg by mouth 2 (two) times daily. )  . hydroxyurea (HYDREA) 500 MG capsule Take 500 mg by mouth daily. May take with food to minimize GI side effects.  Marland Kitchen losartan (COZAAR) 50 MG tablet Take 1 tablet (50 mg total) by mouth  daily.  . metoprolol succinate (TOPROL-XL) 50 MG 24 hr tablet Take 1 tablet (50 mg total) by mouth 2 (two) times a day. Take with or immediately following a meal.  . naproxen sodium (ALEVE) 220 MG tablet Take 440 mg by mouth 2 (two) times daily as needed (pain/headache).  Marland Kitchen omeprazole (PRILOSEC) 20 MG capsule Take 1 capsule (20 mg total) by mouth daily. (Patient taking differently: Take 20 mg by mouth every evening. )  . YUVAFEM 10 MCG TABS vaginal tablet PLACE 1 TABLET (10 MCG TOTAL) VAGINALLY 2 (TWO) TIMES A WEEK.     Allergies:   Codeine and Morphine   Social History   Tobacco Use  . Smoking status: Never Smoker  . Smokeless tobacco: Never Used  Substance Use Topics  . Alcohol use: No  . Drug use: No     Family Hx: The patient's family history includes Cancer in her mother and sister; Heart failure in her father; Stroke in her sister. There is no history of Coronary artery disease.  ROS:   Please see the history of present illness.     All other systems reviewed and are negative.   Prior CV studies:   The following studies were reviewed today:  01/2012 Echo Left ventricle: The cavity size was normal. Wall thickness was normal. Systolic function was mildly to moderately reduced. The estimated ejection fraction was in the range of 40% to 45%. - Ventricular septum: Septal motion showed paradox. - Aortic valve: Mildly calcified annulus. Trileaflet; mildly calcified leaflets. - Mitral valve: Mild to moderate regurgitation. - Left atrium: The atrium was mildly dilated. - Right ventricle: The cavity size was mildly dilated. Wall thickness was normal. - Right atrium: The atrium was mildly dilated. - Atrial septum: No defect or patent foramen ovale was identified. - Pulmonary arteries: PA peak pressure: 85mm Hg (S). - Pericardium, extracardiac: Minimal ascites was noted.   03/2019 event monitor  7 day monitor  Min HR 65, Max HR 169, Avg HR 96  Rare ventricular ectopy  all as isolated PVCs  Occasional supraventricular ectopy in the form of PACs, couplets. Fairly frequent runs of SVT up to 160 bpm  No symptoms reported  Labs/Other Tests and Data Reviewed:    EKG:  No ECG reviewed.  Recent Labs: 11/06/2019: ALT 16; BUN 18; Creatinine, Ser 0.84; Hemoglobin 12.4; Platelets 282; Potassium 4.5; Sodium 139   Recent Lipid Panel Lab Results  Component Value Date/Time   CHOL 261 (H) 07/18/2019 10:26 AM   TRIG 468 (H) 07/18/2019 10:26 AM   HDL 39 (L) 07/18/2019 10:26 AM   CHOLHDL 6.7 (H) 07/18/2019 10:26 AM   CHOLHDL 6.6 12/08/2017 02:02 AM   LDLCALC 136 (H) 07/18/2019 10:26 AM    Wt Readings from Last 3 Encounters:  11/17/19 175 lb (79.4 kg)  11/06/19 175 lb (79.4 kg)  10/08/19 173 lb (78.5 kg)  Objective:    Vital Signs:  BP (!) 143/60   Pulse 72   Ht 6\' 2"  (1.88 m)   Wt 175 lb (79.4 kg)   BMI 22.47 kg/m    Normal affect. Normal speech pattern and tone. Comfortable, no apparent distress. No audible signs of SOB or wheezing.   ASSESSMENT & PLAN:     1. Palpitations/PSVT - doing well on beta blocker, continue current meds  COVID-19 Education: The signs and symptoms of COVID-19 were discussed with the patient and how to seek care for testing (follow up with PCP or arrange E-visit).  The importance of social distancing was discussed today.  Time:   Today, I have spent 15 minutes with the patient with telehealth technology discussing the above problems.     Medication Adjustments/Labs and Tests Ordered: Current medicines are reviewed at length with the patient today.  Concerns regarding medicines are outlined above.   Tests Ordered: No orders of the defined types were placed in this encounter.   Medication Changes: No orders of the defined types were placed in this encounter.   Follow Up:  Either In Person or Virtual in 6 month(s)  Signed, Carlyle Dolly, MD  11/17/2019 4:08 PM    [place]

## 2019-11-17 NOTE — Telephone Encounter (Signed)
Virtual Visit Pre-Appointment Phone Call  "(Name), I am calling you today to discuss your upcoming appointment. We are currently trying to limit exposure to the virus that causes COVID-19 by seeing patients at home rather than in the office."  "What is the BEST phone number to call the day of the visit?" -[phone]  1. "Do you have or have access to (through a family member/friend) a smartphone with video capability that we can use for your visit?" a. If yes - list this number in appt notes as "cell" (if different from BEST phone #) and list the appointment type as a VIDEO visit in appointment notes b. If no - list the appointment type as a PHONE visit in appointment notes  2. Confirm consent - "In the setting of the current Covid19 crisis, you are scheduled for a (phone or video) visit with your provider on (date) at (time).  Just as we do with many in-office visits, in order for you to participate in this visit, we must obtain consent.  If you'd like, I can send this to your mychart (if signed up) or email for you to review.  Otherwise, I can obtain your verbal consent now.  All virtual visits are billed to your insurance company just like a normal visit would be.  By agreeing to a virtual visit, we'd like you to understand that the technology does not allow for your provider to perform an examination, and thus may limit your provider's ability to fully assess your condition. If your provider identifies any concerns that need to be evaluated in person, we will make arrangements to do so.  Finally, though the technology is pretty good, we cannot assure that it will always work on either your or our end, and in the setting of a video visit, we may have to convert it to a phone-only visit.  In either situation, we cannot ensure that we have a secure connection.  Are you willing to proceed?" STAFF: Did the patient verbally acknowledge consent to telehealth visit? Document YES/NO here:  yes 3.   4. Advise patient to be prepared - "Two hours prior to your appointment, go ahead and check your blood pressure, pulse, oxygen saturation, and your weight (if you have the equipment to check those) and write them all down. When your visit starts, your provider will ask you for this information. If you have an Apple Watch or Kardia device, please plan to have heart rate information ready on the day of your appointment. Please have a pen and paper handy nearby the day of the visit as well."  5. Give patient instructions for MyChart download to smartphone OR Doximity/Doxy.me as below if video visit (depending on what platform provider is using)  6. Inform patient they will receive a phone call 15 minutes prior to their appointment time (may be from unknown caller ID) so they should be prepared to answer    TELEPHONE CALL NOTE  RIDHIMA DEMERATH has been deemed a candidate for a follow-up tele-health visit to limit community exposure during the Covid-19 pandemic. I spoke with the patient via phone to ensure availability of phone/video source, confirm preferred email & phone number, and discuss instructions and expectations.  I reminded Heather Barber to be prepared with any vital sign and/or heart rhythm information that could potentially be obtained via home monitoring, at the time of her visit. I reminded Heather Barber to expect a phone call prior to her visit.  Vicky  T Slaughter 11/17/2019 12:10 PM   INSTRUCTIONS FOR DOWNLOADING THE MYCHART APP TO SMARTPHONE  - The patient must first make sure to have activated MyChart and know their login information - If Apple, go to [employer] and type in MyChart in the search bar and download the app. If Android, ask patient to go to [employer] and type in [facility] in the search bar and download the app. The app is free but as with any other app downloads, their phone may require them to verify saved payment information or Apple/Android password.   - The patient will need to then log into the app with their MyChart username and password, and select [facility] as their healthcare provider to link the account. When it is time for your visit, go to the MyChart app, find appointments, and click Begin Video Visit. Be sure to Select Allow for your device to access the Microphone and Camera for your visit. You will then be connected, and your provider will be with you shortly.  **If they have any issues connecting, or need assistance please contact MyChart service desk [phone])**  **If using a computer, in order to ensure the best quality for their visit they will need to use either of the following Internet Browsers: [employer], or Google Chrome**  IF USING DOXIMITY or DOXY.ME - The patient will receive a link just prior to their visit by text.     FULL LENGTH CONSENT FOR TELE-HEALTH VISIT   I hereby voluntarily request, consent and authorize [facility] and its employed or contracted physicians, physician assistants, nurse practitioners or other licensed health care professionals (the Practitioner), to provide me with telemedicine health care services (the "Services") as deemed necessary by the treating Practitioner. I acknowledge and consent to receive the Services by the Practitioner via telemedicine. I understand that the telemedicine visit will involve communicating with the Practitioner through live audiovisual communication technology and the disclosure of certain medical information by electronic transmission. I acknowledge that I have been given the opportunity to request an in-person assessment or other available alternative prior to the telemedicine visit and am voluntarily participating in the telemedicine visit.  I understand that I have the right to withhold or withdraw my consent to the use of telemedicine in the course of my care at any time, without affecting my right to future care or treatment, and that  the Practitioner or I may terminate the telemedicine visit at any time. I understand that I have the right to inspect all information obtained and/or recorded in the course of the telemedicine visit and may receive copies of available information for a reasonable fee.  I understand that some of the potential risks of receiving the Services via telemedicine include:  Marland Kitchen Delay or interruption in medical evaluation due to technological equipment failure or disruption; . Information transmitted may not be sufficient (e.g. poor resolution of images) to allow for appropriate medical decision making by the Practitioner; and/or  . In rare instances, security protocols could fail, causing a breach of personal health information.  Furthermore, I acknowledge that it is my responsibility to provide information about my medical history, conditions and care that is complete and accurate to the best of my ability. I acknowledge that Practitioner's advice, recommendations, and/or decision may be based on factors not within their control, such as incomplete or inaccurate data provided by me or distortions of diagnostic images or specimens that may result from electronic transmissions. I understand that the practice  of medicine is not an Chief Strategy Officer and that Practitioner makes no warranties or guarantees regarding treatment outcomes. I acknowledge that I will receive a copy of this consent concurrently upon execution via email to the email address I last provided but may also request a printed copy by calling the office of [facility].    I understand that my insurance will be billed for this visit.   I have read or had this consent read to me. . I understand the contents of this consent, which adequately explains the benefits and risks of the Services being provided via telemedicine.  . I have been provided ample opportunity to ask questions regarding this consent and the Services and have had my questions answered to  my satisfaction. . I give my informed consent for the services to be provided through the use of telemedicine in my medical care  By participating in this telemedicine visit I agree to the above.

## 2019-11-17 NOTE — Patient Instructions (Signed)

## 2019-11-18 ENCOUNTER — Inpatient Hospital Stay: Payer: Medicare Other | Attending: Gynecologic Oncology | Admitting: Gynecologic Oncology

## 2019-11-18 ENCOUNTER — Other Ambulatory Visit: Payer: Self-pay | Admitting: Gynecologic Oncology

## 2019-11-18 ENCOUNTER — Inpatient Hospital Stay: Payer: Medicare Other

## 2019-11-18 ENCOUNTER — Encounter: Payer: Self-pay | Admitting: Gynecologic Oncology

## 2019-11-18 ENCOUNTER — Other Ambulatory Visit: Payer: Self-pay

## 2019-11-18 VITALS — BP 132/56 | HR 94 | Temp 98.3°F | Resp 20 | Ht 64.0 in | Wt 171.5 lb

## 2019-11-18 DIAGNOSIS — D693 Immune thrombocytopenic purpura: Secondary | ICD-10-CM | POA: Insufficient documentation

## 2019-11-18 DIAGNOSIS — I10 Essential (primary) hypertension: Secondary | ICD-10-CM | POA: Diagnosis not present

## 2019-11-18 DIAGNOSIS — C541 Malignant neoplasm of endometrium: Secondary | ICD-10-CM

## 2019-11-18 DIAGNOSIS — I472 Ventricular tachycardia: Secondary | ICD-10-CM | POA: Diagnosis not present

## 2019-11-18 DIAGNOSIS — E119 Type 2 diabetes mellitus without complications: Secondary | ICD-10-CM

## 2019-11-18 DIAGNOSIS — E1169 Type 2 diabetes mellitus with other specified complication: Secondary | ICD-10-CM

## 2019-11-18 DIAGNOSIS — Z7982 Long term (current) use of aspirin: Secondary | ICD-10-CM | POA: Diagnosis not present

## 2019-11-18 DIAGNOSIS — K219 Gastro-esophageal reflux disease without esophagitis: Secondary | ICD-10-CM | POA: Diagnosis not present

## 2019-11-18 DIAGNOSIS — E785 Hyperlipidemia, unspecified: Secondary | ICD-10-CM | POA: Insufficient documentation

## 2019-11-18 DIAGNOSIS — Z79899 Other long term (current) drug therapy: Secondary | ICD-10-CM | POA: Insufficient documentation

## 2019-11-18 LAB — HEMOGLOBIN A1C
Hgb A1c MFr Bld: 6.5 % — ABNORMAL HIGH (ref 4.8–5.6)
Mean Plasma Glucose: 139.85 mg/dL

## 2019-11-18 MED ORDER — SENNOSIDES-DOCUSATE SODIUM 8.6-50 MG PO TABS: 2.0000 | ORAL_TABLET | Freq: Every day | ORAL | 1 refills | Status: DC

## 2019-11-18 MED ORDER — TRAMADOL HCL 50 MG PO TABS: 50.0000 mg | ORAL_TABLET | Freq: Four times a day (QID) | ORAL | 0 refills | Status: DC | PRN

## 2019-11-18 MED ORDER — IBUPROFEN 600 MG PO TABS: 600.0000 mg | ORAL_TABLET | Freq: Four times a day (QID) | ORAL | 0 refills | Status: DC | PRN

## 2019-11-18 NOTE — Progress Notes (Signed)
Consult Note: Gyn-Onc  Consult was requested by Dr. Glo Herring for the evaluation of Heather Barber 74 y.o. female  CC:  Chief Complaint  Patient presents with  . Endometrial cancer, grade I [facility])    Assessment/Plan:  [name]  is a 74 y.o.  year old with grade 1 endometrioid endometrial cancer.   A detailed discussion was held with the patient and her family with regard to to her endometrial cancer diagnosis. We discussed the standard management options for uterine cancer which includes surgery followed possibly by adjuvant therapy depending on the results of surgery. The options for surgical management include a hysterectomy and removal of the tubes and ovaries possibly with removal of pelvic and para-aortic lymph nodes.If feasible, a minimally invasive approach including a robotic hysterectomy or laparoscopic hysterectomy have benefits including shorter hospital stay, recovery time and better wound healing than with open surgery. The patient has been counseled about these surgical options and the risks of surgery in general including infection, bleeding, damage to surrounding structures (including bowel, bladder, ureters, nerves or vessels), and the postoperative risks of PE/ DVT, and lymphedema. I extensively reviewed the additional risks of robotic hysterectomy including possible need for conversion to open laparotomy.  I discussed positioning during surgery of trendelenberg and risks of minor facial swelling and care we take in preoperative positioning.  After counseling and consideration of her options, she desires to proceed with robotic assisted total hysterectomy with bilateral sapingo-oophorectomy and SLN biopsy.   She will be seen by anesthesia for preoperative clearance and discussion of postoperative pain management.  She was given the opportunity to ask questions, which were answered to her satisfaction, and she is agreement with the above mentioned plan of care.  HPI: 74  year old P4 who was seen in consultation at the request of Dr Glo Herring for evaluation of grade 1 endometrioid endometrial cancer.  The patient reported postmenopausal bleeding that began in February 2020.  This stopped a few months after began and she did not seek medical care for in 2020 until reemerged in December 2020.  She then saw Dr. Glo Herring who performed a transvaginal ultrasound scan which showed a uterus measuring 5.3 x 3.7 x 4.3 cm with an endometrial stripe of 16 mm which was enlarged.  The ovaries were grossly normal.  Due to the endometrial thickening she went to the operating room on November 11, 2019 where a D&C was performed and endometrial polypectomy.  Final pathology from that procedure revealed FIGO grade 1 endometrioid endometrial adenocarcinoma.  The patient's medical history significant for an atrial septal defect repair in 1994.  She takes aspirin 81 mg daily.  She has ventricular tachycardia for which she takes Toprol-XL and losartan.  She has hypertension hyperlipidemia type 2 diabetes mellitus controlled with diet, and essential thrombocytopenia for which she takes hydroxyurea 500 mg daily.  Her last platelet count was greater than 200 in December 2020.  Patient surgical history is significant for an ASD repair and a tonsillectomy and tubal ligation.  She has had no other prior abdominal surgeries.  She has had 4 prior vaginal births.  She has nausea and vomiting with codeine and morphine but no true allergy.  Her family history significant for a sister with breast cancer in her 37s.  Current Meds:  Outpatient Encounter Medications as of 11/18/2019  Medication Sig  . aspirin EC 81 MG tablet Take 81 mg by mouth daily.  Marland Kitchen atorvastatin (LIPITOR) 20 MG tablet Take 1 tablet (20  mg total) by mouth at bedtime.  . furosemide (LASIX) 20 MG tablet TAKE 1 TABLET (20 MG TOTAL) BY MOUTH DAILY. (Patient taking differently: Take 20 mg by mouth 2 (two) times daily. )  . hydroxyurea (HYDREA)  500 MG capsule Take 500 mg by mouth daily. May take with food to minimize GI side effects.  Marland Kitchen losartan (COZAAR) 50 MG tablet Take 1 tablet (50 mg total) by mouth daily.  . metoprolol succinate (TOPROL-XL) 50 MG 24 hr tablet Take 1 tablet (50 mg total) by mouth 2 (two) times a day. Take with or immediately following a meal.  . naproxen sodium (ALEVE) 220 MG tablet Take 440 mg by mouth 2 (two) times daily as needed (pain/headache).  Marland Kitchen omeprazole (PRILOSEC) 20 MG capsule Take 1 capsule (20 mg total) by mouth daily. (Patient taking differently: Take 20 mg by mouth every evening. )  . YUVAFEM 10 MCG TABS vaginal tablet PLACE 1 TABLET (10 MCG TOTAL) VAGINALLY 2 (TWO) TIMES A WEEK.   No facility-administered encounter medications on file as of 11/18/2019.    Allergy:  Allergies  Allergen Reactions  . Codeine Nausea And Vomiting  . Morphine Other (See Comments)    GI symptoms    Social Hx:   Social History   Socioeconomic History  . Marital status: Widowed    Spouse name: widowed  . Number of children: 3  . Years of education: Not on file  . Highest education level: Not on file  Occupational History  . Occupation: Psychologist, prison and probation services  Tobacco Use  . Smoking status: Never Smoker  . Smokeless tobacco: Never Used  Substance and Sexual Activity  . Alcohol use: No  . Drug use: No  . Sexual activity: Not Currently    Birth control/protection: Post-menopausal  Other Topics Concern  . Not on file  Social History Narrative   Married and resides in [place] with husband and 3 children   Sedentary lifestyle   Social Determinants of Health   Financial Resource Strain:   . Difficulty of Paying Living Expenses: Not on file  Food Insecurity:   . Worried About Charity fundraiser in the Last Year: Not on file  . Ran Out of Food in the Last Year: Not on file  Transportation Needs:   . Lack of Transportation (Medical): Not on file  . Lack of Transportation (Non-Medical): Not on file   Physical Activity:   . Days of Exercise per Week: Not on file  . Minutes of Exercise per Session: Not on file  Stress:   . Feeling of Stress : Not on file  Social Connections:   . Frequency of Communication with Friends and Family: Not on file  . Frequency of Social Gatherings with Friends and Family: Not on file  . Attends Religious Services: Not on file  . Active Member of Clubs or Organizations: Not on file  . Attends Archivist Meetings: Not on file  . Marital Status: Not on file  Intimate Partner Violence:   . Fear of Current or Ex-Partner: Not on file  . Emotionally Abused: Not on file  . Physically Abused: Not on file  . Sexually Abused: Not on file    Past Surgical Hx:  Past Surgical History:  Procedure Laterality Date  . ASD [place] EXCISIONAL BIOPSY     Right and left  . CATARACT EXTRACTION W/PHACO Left 03/11/2013   Procedure: CATARACT EXTRACTION PHACO AND INTRAOCULAR  LENS PLACEMENT (IOC);  Surgeon: [name], MD;  Location: [facility];  Service: Ophthalmology;  Laterality: Left;  CDE:  12.20  . HYSTEROSCOPY WITH D & C N/A 11/11/2019   Procedure: DILATATION AND CURETTAGE /HYSTEROSCOPY;  Surgeon: [name], MD;  Location: [facility];  Service: Gynecology;  Laterality: N/A;  . POLYPECTOMY N/A 11/11/2019   Procedure: POLYPECTOMY(ENDOMETRIAL POLYP);  Surgeon: [name], MD;  Location: [facility];  Service: Gynecology;  Laterality: N/A;  . TONSILLECTOMY    . TUBAL LIGATION     Bilateral    Past Medical Hx:  Past Medical History:  Diagnosis Date  . Atrial septal defect    Surgical repair in 1994  . Chest pain    Normal coronary angiography in 1994 and 2011  . Dizziness   . Endometrial cancer, grade I ([place]) 10/09/2019  . Essential thrombocythemia ([facility]) 06/06/2016  . GERD (gastroesophageal reflux disease)   . History of kidney stones   . Hyperlipidemia   . Hypertension   . LBBB (left bundle branch block) 2011  .  Palpitations   . PONV (postoperative nausea and vomiting)   . Syncope   . Type 2 diabetes mellitus with other specified complication ([place]) 123456  . Ventricular tachycardia ([name])    Exercise induced    Past Gynecological History:  SVD x 4 No LMP recorded. Patient is postmenopausal.  Family Hx:  Family History  Problem Relation Age of Onset  . Cancer Mother        female  . Stroke Sister   . Cancer Sister        breast  . Heart failure Father   . Coronary artery disease Neg Hx     Review of Systems:  Constitutional  Feels well,    ENT Normal appearing ears and nares bilaterally Skin/Breast  No rash, sores, jaundice, itching, dryness Cardiovascular  No chest pain, shortness of breath, or edema  Pulmonary  No cough or wheeze.  Gastro Intestinal  No nausea, vomitting, or diarrhoea. No bright red blood per rectum, no abdominal pain, change in bowel movement, or constipation.  Genito Urinary  No frequency, urgency, dysuria, + postmenopausal bleeding Musculo Skeletal  No myalgia, arthralgia, joint swelling or pain  Neurologic  No weakness, numbness, change in gait,  Psychology  No depression, anxiety, insomnia.   Vitals:  Blood pressure (!) 132/56, pulse 94, temperature 98.3 F (36.8 C), temperature source Temporal, resp. rate 20, height 5\' 4"  (1.626 m), weight 171 lb 8 oz (77.8 kg), SpO2 97 %.  Physical Exam: WD in NAD Neck  Supple NROM, without any enlargements.  Lymph Node Survey No cervical supraclavicular or inguinal adenopathy Cardiovascular  Pulse normal rate, regularity and rhythm. S1 and S2 normal.  Lungs  Clear to auscultation bilateraly, without wheezes/crackles/rhonchi. Good air movement.  Skin  No rash/lesions/breakdown  Psychiatry  Alert and oriented to person, place, and time  Abdomen  Normoactive bowel sounds, abdomen soft, non-tender and obese without evidence of hernia. Back No CVA tenderness Genito Urinary  Vulva/vagina: Normal  external female genitalia.  No lesions. No discharge or bleeding.  Bladder/urethra:  No lesions or masses, well supported bladder  Vagina: normal  Cervix: Normal appearing, no lesions.  Uterus:  Small, mobile, no parametrial involvement or nodularity.  Adnexa: no palpable masses. Rectal  deferred Extremities  No bilateral cyanosis, clubbing or edema.   Thereasa Solo, MD  11/18/2019, 12:35 PM

## 2019-11-18 NOTE — Patient Instructions (Addendum)
Preparing for your Surgery  Plan for surgery on December 09, 2019 with Dr. [name] at [facility] will be scheduled for a robotic assisted total laparoscopic hysterectomy, bilateral salpingo-oophorectomy, sentinel lymph node biopsy.  STOP TAKING YOUR ASPIRIN 10 DAYS BEFORE SURGERY. We will also find out when you need to stop taking hydroxyurea and contact you with that information.  We will be checking a hemoglobin A1C today and will contact you with the results.  Pre-operative Testing -You will receive a phone call from presurgical testing at [facility] if you have not received a call already to arrange for a pre-operative testing appointment before your surgery.  This appointment normally occurs one to two weeks before your scheduled surgery.   -Bring your insurance card, copy of an advanced directive if applicable, medication list  -At that visit, you will be asked to sign a consent for a possible blood transfusion in case a transfusion becomes necessary during surgery.  The need for a blood transfusion is rare but having consent is a necessary part of your care.     -You should not be taking blood thinners or aspirin at least ten days prior to surgery unless instructed by your surgeon.  -Do not take supplements such as fish oil (omega 3), red yeast rice, tumeric before your surgery.   Day Before Surgery at [place] will be asked to take in a light diet the day before surgery.  Avoid carbonated beverages.  You will be advised to have nothing to eat or drink after midnight the evening before.    Eat a light diet the day before surgery.  Examples including soups, broths, toast, yogurt, mashed potatoes.  Things to avoid include carbonated beverages (fizzy beverages), raw fruits and raw vegetables, or beans.   If your bowels are filled with gas, your surgeon will have difficulty visualizing your pelvic organs which increases your surgical risks.  Your role in  recovery Your role is to become active as soon as directed by your doctor, while still giving yourself time to heal.  Rest when you feel tired. You will be asked to do the following in order to speed your recovery:  - Cough and breathe deeply. This helps to clear and expand your lungs and can prevent pneumonia after surgery.  - [place]. Do mild physical activity. Walking or moving your legs help your circulation and body functions return to normal. Do not try to get up or walk alone the first time after surgery.   -If you develop swelling on one leg or the other, pain in the back of your leg, redness/warmth in one of your legs, please call the office or go to the Emergency Room to have a doppler to rule out a blood clot. For shortness of breath, chest pain-seek care in the Emergency Room as soon as possible. - Actively manage your pain. Managing your pain lets you move in comfort. We will ask you to rate your pain on a scale of zero to 10. It is your responsibility to tell your doctor or nurse where and how much you hurt so your pain can be treated.  Special Considerations -If you are diabetic, you may be placed on insulin after surgery to have closer control over your blood sugars to promote healing and recovery.  This does not mean that you will be discharged on insulin.  If applicable, your oral antidiabetics will be resumed when you are tolerating a  solid diet.  -Your final pathology results from surgery should be available around one week after surgery and the results will be relayed to you when available.  -Dr. Lahoma Crocker is the surgeon that assists your GYN Oncologist with surgery.  If you end up staying the night, the next day after your surgery you will either see Dr. Denman George, Dr. Berline Lopes, or Dr. Lahoma Crocker.  -FMLA forms can be faxed to [phone] and please allow 5-7 business days for completion.  Pain Management After Surgery -You have been prescribed  your pain medication and bowel regimen medications before surgery so that you can have these available when you are discharged from the hospital. The pain medication is for use ONLY AFTER surgery and a new prescription will not be given.   -Make sure that you have Tylenol and Ibuprofen at home to use on a regular basis after surgery for pain control. We recommend alternating the medications every hour to six hours since they work differently and are processed in the body differently for pain relief.  -Review the attached handout on narcotic use and their risks and side effects.   Bowel Regimen -You have been prescribed Sennakot-S to take nightly to prevent constipation especially if you are taking the narcotic pain medication intermittently.  It is important to prevent constipation and drink adequate amounts of liquids. You can stop taking this medication when you are not taking pain medication and you are back on your normal bowel routine.   Blood Transfusion Information (For the consent to be signed before surgery)  We will be checking your blood type before surgery so in case of emergencies, we will know what type of blood you would need.                                            WHAT IS A BLOOD TRANSFUSION?  A transfusion is the replacement of blood or some of its parts. Blood is made up of multiple cells which provide different functions.  Red blood cells carry oxygen and are used for blood loss replacement.  White blood cells fight against infection.  Platelets control bleeding.  Plasma helps clot blood.  Other blood products are available for specialized needs, such as hemophilia or other clotting disorders. BEFORE THE TRANSFUSION  Who gives blood for transfusions?   You may be able to donate blood to be used at a later date on yourself (autologous donation).  Relatives can be asked to donate blood. This is generally not any safer than if you have received blood from a  stranger. The same precautions are taken to ensure safety when a relative's blood is donated.  Healthy volunteers who are fully evaluated to make sure their blood is safe. This is blood bank blood. Transfusion therapy is the safest it has ever been in the practice of medicine. Before blood is taken from a donor, a complete history is taken to make sure that person has no history of diseases nor engages in risky social behavior (examples are intravenous drug use or sexual activity with multiple partners). The donor's travel history is screened to minimize risk of transmitting infections, such as malaria. The donated blood is tested for signs of infectious diseases, such as HIV and hepatitis. The blood is then tested to be sure it is compatible with you in order to minimize the chance of a transfusion  reaction. If you or a relative donates blood, this is often done in anticipation of surgery and is not appropriate for emergency situations. It takes many days to process the donated blood. RISKS AND COMPLICATIONS Although transfusion therapy is very safe and saves many lives, the main dangers of transfusion include:   Getting an infectious disease.  Developing a transfusion reaction. This is an allergic reaction to something in the blood you were given. Every precaution is taken to prevent this. The decision to have a blood transfusion has been considered carefully by your caregiver before blood is given. Blood is not given unless the benefits outweigh the risks.  AFTER SURGERY INSTRUCTIONS  Return to work: 4-6 weeks if applicable  Activity: 1. Be up and out of the bed during the day.  Take a nap if needed.  You may walk up steps but be careful and use the hand rail.  Stair climbing will tire you more than you think, you may need to stop part way and rest.   2. No lifting or straining for 6 weeks over 10 pounds. No pushing, pulling, straining for 6 weeks.  3. No driving for 1 week(s) if you were  cleared to drive before surgery.  Do not drive if you are taking narcotic pain medicine.   4. You can shower as soon as the next day after surgery. Shower daily.  Use soap and water on your incision and pat dry; don't rub.  No tub baths or submerging your body in water until cleared by your surgeon. If you have the soap that was given to you by pre-surgical testing that was used before surgery, you do not need to use it afterwards because this can irritate your incisions.   5. No sexual activity and nothing in the vagina for 8 weeks.  6. You may experience a small amount of clear drainage from your incisions, which is normal.  If the drainage persists, increases, or changes color please call the office.  7. Do not use creams, lotions, or ointments such as neosporin on your incisions after surgery until advised by your surgeon because they can cause removal of the dermabond glue on your incisions.    8. You may experience vaginal spotting after surgery or around the 6-8 week mark from surgery when the stitches at the top of the vagina begin to dissolve.  The spotting is normal but if you experience heavy bleeding, call our office.  9. Take Tylenol or ibuprofen first for pain and only use narcotic pain medication for severe pain not relieved by the Tylenol or Ibuprofen.  Monitor your Tylenol intake to a max of 4,000 mg.  Diet: 1. Low sodium Heart Healthy Diet is recommended.  2. It is safe to use a laxative, such as Miralax or Colace, if you have difficulty moving your bowels. You can take Sennakot at bedtime every evening to keep bowel movements regular and to prevent constipation.    Wound Care: 1. Keep clean and dry.  Shower daily.  Reasons to call the Doctor:  Fever - Oral temperature greater than 100.4 degrees Fahrenheit  Foul-smelling vaginal discharge  Difficulty urinating  Nausea and vomiting  Increased pain at the site of the incision that is unrelieved with pain  medicine.  Difficulty breathing with or without chest pain  New calf pain especially if only on one side  Sudden, continuing increased vaginal bleeding with or without clots.   Contacts: For questions or concerns you should contact:  Dr.  [name] at [phone]  Joylene John, NP at [phone]  After Hours: call [phone] and have the GYN Oncologist paged/contacted

## 2019-11-18 NOTE — H&P (View-Only) (Signed)
Consult Note: Gyn-Onc  Consult was requested by Dr. Glo Herring for the evaluation of Heather Barber 74 y.o. female  CC:  Chief Complaint  Patient presents with  . Endometrial cancer, grade I [facility])    Assessment/Plan:  [name]  is a 74 y.o.  year old with grade 1 endometrioid endometrial cancer.   A detailed discussion was held with the patient and her family with regard to to her endometrial cancer diagnosis. We discussed the standard management options for uterine cancer which includes surgery followed possibly by adjuvant therapy depending on the results of surgery. The options for surgical management include a hysterectomy and removal of the tubes and ovaries possibly with removal of pelvic and para-aortic lymph nodes.If feasible, a minimally invasive approach including a robotic hysterectomy or laparoscopic hysterectomy have benefits including shorter hospital stay, recovery time and better wound healing than with open surgery. The patient has been counseled about these surgical options and the risks of surgery in general including infection, bleeding, damage to surrounding structures (including bowel, bladder, ureters, nerves or vessels), and the postoperative risks of PE/ DVT, and lymphedema. I extensively reviewed the additional risks of robotic hysterectomy including possible need for conversion to open laparotomy.  I discussed positioning during surgery of trendelenberg and risks of minor facial swelling and care we take in preoperative positioning.  After counseling and consideration of her options, she desires to proceed with robotic assisted total hysterectomy with bilateral sapingo-oophorectomy and SLN biopsy.   She will be seen by anesthesia for preoperative clearance and discussion of postoperative pain management.  She was given the opportunity to ask questions, which were answered to her satisfaction, and she is agreement with the above mentioned plan of care.  HPI: 74  year old P4 who was seen in consultation at the request of Dr Glo Herring for evaluation of grade 1 endometrioid endometrial cancer.  The patient reported postmenopausal bleeding that began in February 2020.  This stopped a few months after began and she did not seek medical care for in 2020 until reemerged in December 2020.  She then saw Dr. Glo Herring who performed a transvaginal ultrasound scan which showed a uterus measuring 5.3 x 3.7 x 4.3 cm with an endometrial stripe of 16 mm which was enlarged.  The ovaries were grossly normal.  Due to the endometrial thickening she went to the operating room on November 11, 2019 where a D&C was performed and endometrial polypectomy.  Final pathology from that procedure revealed FIGO grade 1 endometrioid endometrial adenocarcinoma.  The patient's medical history significant for an atrial septal defect repair in 1994.  She takes aspirin 81 mg daily.  She has ventricular tachycardia for which she takes Toprol-XL and losartan.  She has hypertension hyperlipidemia type 2 diabetes mellitus controlled with diet, and essential thrombocytopenia for which she takes hydroxyurea 500 mg daily.  Her last platelet count was greater than 200 in December 2020.  Patient surgical history is significant for an ASD repair and a tonsillectomy and tubal ligation.  She has had no other prior abdominal surgeries.  She has had 4 prior vaginal births.  She has nausea and vomiting with codeine and morphine but no true allergy.  Her family history significant for a sister with breast cancer in her 35s.  Current Meds:  Outpatient Encounter Medications as of 11/18/2019  Medication Sig  . aspirin EC 81 MG tablet Take 81 mg by mouth daily.  Marland Kitchen atorvastatin (LIPITOR) 20 MG tablet Take 1 tablet (20  mg total) by mouth at bedtime.  . furosemide (LASIX) 20 MG tablet TAKE 1 TABLET (20 MG TOTAL) BY MOUTH DAILY. (Patient taking differently: Take 20 mg by mouth 2 (two) times daily. )  . hydroxyurea (HYDREA)  500 MG capsule Take 500 mg by mouth daily. May take with food to minimize GI side effects.  Marland Kitchen losartan (COZAAR) 50 MG tablet Take 1 tablet (50 mg total) by mouth daily.  . metoprolol succinate (TOPROL-XL) 50 MG 24 hr tablet Take 1 tablet (50 mg total) by mouth 2 (two) times a day. Take with or immediately following a meal.  . naproxen sodium (ALEVE) 220 MG tablet Take 440 mg by mouth 2 (two) times daily as needed (pain/headache).  Marland Kitchen omeprazole (PRILOSEC) 20 MG capsule Take 1 capsule (20 mg total) by mouth daily. (Patient taking differently: Take 20 mg by mouth every evening. )  . YUVAFEM 10 MCG TABS vaginal tablet PLACE 1 TABLET (10 MCG TOTAL) VAGINALLY 2 (TWO) TIMES A WEEK.   No facility-administered encounter medications on file as of 11/18/2019.    Allergy:  Allergies  Allergen Reactions  . Codeine Nausea And Vomiting  . Morphine Other (See Comments)    GI symptoms    Social Hx:   Social History   Socioeconomic History  . Marital status: Widowed    Spouse name: widowed  . Number of children: 3  . Years of education: Not on file  . Highest education level: Not on file  Occupational History  . Occupation: Psychologist, prison and probation services  Tobacco Use  . Smoking status: Never Smoker  . Smokeless tobacco: Never Used  Substance and Sexual Activity  . Alcohol use: No  . Drug use: No  . Sexual activity: Not Currently    Birth control/protection: Post-menopausal  Other Topics Concern  . Not on file  Social History Narrative   Married and resides in [place] with husband and 3 children   Sedentary lifestyle   Social Determinants of Health   Financial Resource Strain:   . Difficulty of Paying Living Expenses: Not on file  Food Insecurity:   . Worried About Charity fundraiser in the Last Year: Not on file  . Ran Out of Food in the Last Year: Not on file  Transportation Needs:   . Lack of Transportation (Medical): Not on file  . Lack of Transportation (Non-Medical): Not on file   Physical Activity:   . Days of Exercise per Week: Not on file  . Minutes of Exercise per Session: Not on file  Stress:   . Feeling of Stress : Not on file  Social Connections:   . Frequency of Communication with Friends and Family: Not on file  . Frequency of Social Gatherings with Friends and Family: Not on file  . Attends Religious Services: Not on file  . Active Member of Clubs or Organizations: Not on file  . Attends Archivist Meetings: Not on file  . Marital Status: Not on file  Intimate Partner Violence:   . Fear of Current or Ex-Partner: Not on file  . Emotionally Abused: Not on file  . Physically Abused: Not on file  . Sexually Abused: Not on file    Past Surgical Hx:  Past Surgical History:  Procedure Laterality Date  . ASD [place] EXCISIONAL BIOPSY     Right and left  . CATARACT EXTRACTION W/PHACO Left 03/11/2013   Procedure: CATARACT EXTRACTION PHACO AND INTRAOCULAR  LENS PLACEMENT (IOC);  Surgeon: [name], MD;  Location: [facility];  Service: Ophthalmology;  Laterality: Left;  CDE:  12.20  . HYSTEROSCOPY WITH D & C N/A 11/11/2019   Procedure: DILATATION AND CURETTAGE /HYSTEROSCOPY;  Surgeon: [name], MD;  Location: [facility];  Service: Gynecology;  Laterality: N/A;  . POLYPECTOMY N/A 11/11/2019   Procedure: POLYPECTOMY(ENDOMETRIAL POLYP);  Surgeon: [name], MD;  Location: [facility];  Service: Gynecology;  Laterality: N/A;  . TONSILLECTOMY    . TUBAL LIGATION     Bilateral    Past Medical Hx:  Past Medical History:  Diagnosis Date  . Atrial septal defect    Surgical repair in 1994  . Chest pain    Normal coronary angiography in 1994 and 2011  . Dizziness   . Endometrial cancer, grade I ([place]) 10/09/2019  . Essential thrombocythemia ([place]) 06/06/2016  . GERD (gastroesophageal reflux disease)   . History of kidney stones   . Hyperlipidemia   . Hypertension   . LBBB (left bundle branch block) 2011  .  Palpitations   . PONV (postoperative nausea and vomiting)   . Syncope   . Type 2 diabetes mellitus with other specified complication ([place]) 123456  . Ventricular tachycardia ([name])    Exercise induced    Past Gynecological History:  SVD x 4 No LMP recorded. Patient is postmenopausal.  Family Hx:  Family History  Problem Relation Age of Onset  . Cancer Mother        female  . Stroke Sister   . Cancer Sister        breast  . Heart failure Father   . Coronary artery disease Neg Hx     Review of Systems:  Constitutional  Feels well,    ENT Normal appearing ears and nares bilaterally Skin/Breast  No rash, sores, jaundice, itching, dryness Cardiovascular  No chest pain, shortness of breath, or edema  Pulmonary  No cough or wheeze.  Gastro Intestinal  No nausea, vomitting, or diarrhoea. No bright red blood per rectum, no abdominal pain, change in bowel movement, or constipation.  Genito Urinary  No frequency, urgency, dysuria, + postmenopausal bleeding Musculo Skeletal  No myalgia, arthralgia, joint swelling or pain  Neurologic  No weakness, numbness, change in gait,  Psychology  No depression, anxiety, insomnia.   Vitals:  Blood pressure (!) 132/56, pulse 94, temperature 98.3 F (36.8 C), temperature source Temporal, resp. rate 20, height 5\' 4"  (1.626 m), weight 171 lb 8 oz (77.8 kg), SpO2 97 %.  Physical Exam: WD in NAD Neck  Supple NROM, without any enlargements.  Lymph Node Survey No cervical supraclavicular or inguinal adenopathy Cardiovascular  Pulse normal rate, regularity and rhythm. S1 and S2 normal.  Lungs  Clear to auscultation bilateraly, without wheezes/crackles/rhonchi. Good air movement.  Skin  No rash/lesions/breakdown  Psychiatry  Alert and oriented to person, place, and time  Abdomen  Normoactive bowel sounds, abdomen soft, non-tender and obese without evidence of hernia. Back No CVA tenderness Genito Urinary  Vulva/vagina: Normal  external female genitalia.  No lesions. No discharge or bleeding.  Bladder/urethra:  No lesions or masses, well supported bladder  Vagina: normal  Cervix: Normal appearing, no lesions.  Uterus:  Small, mobile, no parametrial involvement or nodularity.  Adnexa: no palpable masses. Rectal  deferred Extremities  No bilateral cyanosis, clubbing or edema.   Thereasa Solo, MD  11/18/2019, 12:35 PM

## 2019-11-19 ENCOUNTER — Telehealth: Payer: Self-pay | Admitting: *Deleted

## 2019-11-19 ENCOUNTER — Encounter: Payer: Medicare Other | Admitting: Obstetrics and Gynecology

## 2019-11-19 NOTE — Telephone Encounter (Signed)
Left message for pt to call office in regards to change in medication prior to surgery.

## 2019-11-20 ENCOUNTER — Telehealth: Payer: Self-pay

## 2019-11-20 NOTE — Telephone Encounter (Signed)
Ms Adamek returned our call from yesterday.  I let her know that per Joylene John, NP she is to hold her hydroxyurea the day before surgery and will continue to hold for 1 to 2 weeks after surgery.  I told her that after her surgery instructions will be provided as to when to restart hydroxyurea.  She verbalized understanding.

## 2019-11-21 ENCOUNTER — Telehealth: Payer: Self-pay | Admitting: Obstetrics and Gynecology

## 2019-11-21 ENCOUNTER — Encounter: Payer: Medicare Other | Admitting: Obstetrics and Gynecology

## 2019-11-21 ENCOUNTER — Other Ambulatory Visit (HOSPITAL_COMMUNITY): Payer: Self-pay | Admitting: Hematology

## 2019-11-21 DIAGNOSIS — D473 Essential (hemorrhagic) thrombocythemia: Secondary | ICD-10-CM

## 2019-11-21 NOTE — Telephone Encounter (Signed)
Heather Barber was referred to Dr Denman George for Ca Endomtrium.  She has been scheduled for Robotic Hysterectomy 9 February in [place]. I left a message that her Office visit today is optional. Pt Asked to call.

## 2019-11-25 ENCOUNTER — Telehealth: Payer: Self-pay | Admitting: *Deleted

## 2019-11-25 NOTE — Telephone Encounter (Signed)
Pt notified of Hgb A1C level. Pt educated on the importance of limiting carb/sugar intake. Pt reports that she is trying to watch her diet and limit sugar intake.

## 2019-12-05 ENCOUNTER — Other Ambulatory Visit: Payer: Self-pay

## 2019-12-05 ENCOUNTER — Encounter (HOSPITAL_COMMUNITY): Payer: Self-pay

## 2019-12-05 ENCOUNTER — Other Ambulatory Visit (HOSPITAL_COMMUNITY)
Admission: RE | Admit: 2019-12-05 | Discharge: 2019-12-05 | Disposition: A | Discharge status: Home / Self Care | Payer: Medicare Other | Source: Ambulatory Visit | Attending: Gynecologic Oncology | Admitting: Gynecologic Oncology

## 2019-12-05 ENCOUNTER — Encounter (HOSPITAL_COMMUNITY)
Admission: RE | Admit: 2019-12-05 | Discharge: 2019-12-05 | Disposition: A | Discharge status: Home / Self Care | Payer: Medicare Other | Source: Ambulatory Visit | Attending: Gynecologic Oncology | Admitting: Gynecologic Oncology

## 2019-12-05 DIAGNOSIS — Z20822 Contact with and (suspected) exposure to covid-19: Secondary | ICD-10-CM | POA: Insufficient documentation

## 2019-12-05 DIAGNOSIS — Z01812 Encounter for preprocedural laboratory examination: Secondary | ICD-10-CM | POA: Diagnosis not present

## 2019-12-05 HISTORY — DX: Anxiety disorder, unspecified: F41.9

## 2019-12-05 LAB — COMPREHENSIVE METABOLIC PANEL
ALT: 14 U/L (ref 0–44)
AST: 21 U/L (ref 15–41)
Albumin: 4.4 g/dL (ref 3.5–5.0)
Alkaline Phosphatase: 95 U/L (ref 38–126)
Anion gap: 11 (ref 5–15)
BUN: 13 mg/dL (ref 8–23)
CO2: 25 mmol/L (ref 22–32)
Calcium: 9.6 mg/dL (ref 8.9–10.3)
Chloride: 104 mmol/L (ref 98–111)
Creatinine, Ser: 0.8 mg/dL (ref 0.44–1.00)
GFR calc Af Amer: >60 mL/min (ref >60)
GFR calc non Af Amer: >60 mL/min (ref >60)
Glucose, Bld: 128 mg/dL — ABNORMAL HIGH (ref 70–99)
Potassium: 4.6 mmol/L (ref 3.5–5.1)
Sodium: 140 mmol/L (ref 135–145)
Total Bilirubin: 0.9 mg/dL (ref 0.3–1.2)
Total Protein: 7.3 g/dL (ref 6.5–8.1)

## 2019-12-05 LAB — URINALYSIS, ROUTINE W REFLEX MICROSCOPIC
Bacteria, UA: NONE SEEN
Bilirubin Urine: NEGATIVE
Glucose, UA: NEGATIVE mg/dL
Hgb urine dipstick: NEGATIVE
Ketones, ur: NEGATIVE mg/dL
Nitrite: NEGATIVE
Protein, ur: NEGATIVE mg/dL
Specific Gravity, Urine: 1.01 (ref 1.005–1.030)
pH: 6 (ref 5.0–8.0)

## 2019-12-05 LAB — ABO/RH: ABO/RH(D): A POS

## 2019-12-05 LAB — CBC
HCT: 37.1 % (ref 36.0–46.0)
Hemoglobin: 12.7 g/dL (ref 12.0–15.0)
MCH: 39.1 pg — ABNORMAL HIGH (ref 26.0–34.0)
MCHC: 34.2 g/dL (ref 30.0–36.0)
MCV: 114.2 fL — ABNORMAL HIGH (ref 80.0–100.0)
Platelets: 313 10*3/uL (ref 150–400)
RBC: 3.25 MIL/uL — ABNORMAL LOW (ref 3.87–5.11)
RDW: 11.9 % (ref 11.5–15.5)
WBC: 5.8 10*3/uL (ref 4.0–10.5)
nRBC: 0 % (ref 0.0–0.2)

## 2019-12-05 LAB — SARS CORONAVIRUS 2 (TAT 6-24 HRS): SARS Coronavirus 2: NEGATIVE

## 2019-12-05 LAB — GLUCOSE, CAPILLARY: Glucose-Capillary: 135 mg/dL — ABNORMAL HIGH (ref 70–99)

## 2019-12-05 NOTE — Patient Instructions (Addendum)
DUE TO COVID-19 ONLY ONE VISITOR IS ALLOWED TO COME WITH YOU AND STAY IN THE WAITING ROOM ONLY DURING PRE OP AND PROCEDURE DAY OF SURGERY. THE 1 VISITOR MAY VISIT WITH YOU AFTER SURGERY IN YOUR PRIVATE ROOM DURING VISITING HOURS ONLY!  YOU NEED TO HAVE A COVID 19 TEST ON_Friday 02/05/2021______ @___1 :35 pm____, THIS TEST MUST BE DONE BEFORE SURGERY, COME  [address] , [address].  ([place]) ONCE YOUR COVID TEST IS COMPLETED, PLEASE BEGIN THE QUARANTINE INSTRUCTIONS AS OUTLINED IN YOUR HANDOUT.                Heather Barber     Your procedure is scheduled on: Tuesday 12/09/2019   Report to [facility] Main  Entrance    Report to admitting at  0800 AM     Call this number if you have problems the morning of surgery [phone]         Eat a light diet the day before surgery.  Examples including soups, broths, toast, yogurt, mashed potatoes.  Things to avoid include carbonated beverages (fizzy beverages), raw fruits and raw vegetables, or beans.    If your bowels are filled with gas, your surgeon will have difficulty visualizing your pelvic organs which increases your surgical risks.    Remember: Do not eat food  :After Midnight. May have clear liquids from midnight up until 0700 am then nothing until after surgery!     CLEAR LIQUID DIET   Foods Allowed                                                                     Foods Excluded  Coffee and tea, regular and decaf                             liquids that you cannot  Plain Jell-O any favor except red or purple                                           see through such as: Fruit ices (not with fruit pulp)                                     milk, soups, orange juice  Iced Popsicles                                    All solid food                                Cranberry, grape and apple juices Sports drinks like Gatorade Lightly seasoned clear broth or consume(fat free) Sugar,  honey syrup  Sample Menu Breakfast                                Lunch  Supper Cranberry juice                    Beef broth                            Chicken broth Jell-O                                     Grape juice                           Apple juice Coffee or tea                        Jell-O                                      Popsicle                                                Coffee or tea                        Coffee or tea  _____________________________________________________________________      BRUSH YOUR TEETH MORNING OF SURGERY AND RINSE YOUR MOUTH OUT, NO CHEWING GUM CANDY OR MINTS.     Take these medicines the morning of surgery with A SIP OF WATER: Metoprolol succinate (Toprol-XL)   DO NOT TAKE ANY DIABETIC MEDICATIONS DAY OF YOUR SURGERY!                               You may not have any metal on your body including hair pins and              piercings  Do not wear jewelry, make-up, lotions, powders or perfumes, deodorant             Do not wear nail polish on your fingernails.  Do not shave  48 hours prior to surgery.                Do not bring valuables to the hospital. [place].  Contacts, dentures or bridgework may not be worn into surgery.  Leave suitcase in the car. After surgery it may be brought to your room.     Patients discharged the day of surgery will not be allowed to drive home. IF YOU ARE HAVING SURGERY AND GOING HOME THE SAME DAY, YOU MUST HAVE AN ADULT TO DRIVE YOU HOME AND  BE WITH YOU FOR 24 HOURS. YOU MAY GO HOME BY TAXI OR UBER OR ORTHERWISE, BUT AN ADULT MUST ACCOMPANY YOU HOME AND STAY WITH YOU FOR 24 HOURS.  Name and phone number of your driver:daughter- Heather Barber                Please read over the following fact sheets you were given: _____________________________________________________________________  [facility] -  Preparing for Surgery Before surgery, you can play an important role.  Because skin is not sterile, your skin needs to be as free of germs as possible.  You can reduce the number of germs on your skin by washing with CHG (chlorahexidine gluconate) soap before surgery.  CHG is an antiseptic cleaner which kills germs and bonds with the skin to continue killing germs even after washing. Please DO NOT use if you have an allergy to CHG or antibacterial soaps.  If your skin becomes reddened/irritated stop using the CHG and inform your nurse when you arrive at Short Stay. Do not shave (including legs and underarms) for at least 48 hours prior to the first CHG shower.  You may shave your face/neck. Please follow these instructions carefully:  1.  Shower with CHG Soap the night before surgery and the  morning of Surgery.  2.  If you choose to wash your hair, wash your hair first as usual with your  normal  shampoo.  3.  After you shampoo, rinse your hair and body thoroughly to remove the  shampoo.                           4.  Use CHG as you would any other liquid soap.  You can apply chg directly  to the skin and wash                       Gently with a scrungie or clean washcloth.  5.  Apply the CHG Soap to your body ONLY FROM THE NECK DOWN.   Do not use on face/ open                           Wound or open sores. Avoid contact with eyes, ears mouth and genitals (private parts).                       Wash face,  Genitals (private parts) with your normal soap.             6.  Wash thoroughly, paying special attention to the area where your surgery  will be performed.  7.  Thoroughly rinse your body with warm water from the neck down.  8.  DO NOT shower/wash with your normal soap after using and rinsing off  the CHG Soap.                9.  Pat yourself dry with a clean towel.            10.  Wear clean pajamas.            11.  Place clean sheets on your bed the night of your first shower and do not  sleep  with pets. Day of Surgery : Do not apply any lotions/deodorants the morning of surgery.  Please wear clean clothes to the hospital/surgery center.  FAILURE TO FOLLOW THESE INSTRUCTIONS MAY RESULT IN THE CANCELLATION OF YOUR SURGERY PATIENT SIGNATURE_________________________________  NURSE SIGNATURE__________________________________  ________________________________________________________________________   Adam Phenix  An incentive spirometer is a tool that can help keep your lungs clear and active. This tool measures how well you are filling your lungs with each breath. Taking long deep breaths may help reverse or decrease the chance of developing breathing (pulmonary) problems (especially infection) following:  A long period of  time when you are unable to move or be active. BEFORE THE PROCEDURE   If the spirometer includes an indicator to show your best effort, your nurse or respiratory therapist will set it to a desired goal.  If possible, sit up straight or lean slightly forward. Try not to slouch.  Hold the incentive spirometer in an upright position. INSTRUCTIONS FOR USE  1. Sit on the edge of your bed if possible, or sit up as far as you can in bed or on a chair. 2. Hold the incentive spirometer in an upright position. 3. Breathe out normally. 4. Place the mouthpiece in your mouth and seal your lips tightly around it. 5. Breathe in slowly and as deeply as possible, raising the piston or the ball toward the top of the column. 6. Hold your breath for 3-5 seconds or for as long as possible. Allow the piston or ball to fall to the bottom of the column. 7. Remove the mouthpiece from your mouth and breathe out normally. 8. Rest for a few seconds and repeat Steps 1 through 7 at least 10 times every 1-2 hours when you are awake. Take your time and take a few normal breaths between deep breaths. 9. The spirometer may include an indicator to show your best effort. Use the  indicator as a goal to work toward during each repetition. 10. After each set of 10 deep breaths, practice coughing to be sure your lungs are clear. If you have an incision (the cut made at the time of surgery), support your incision when coughing by placing a pillow or rolled up towels firmly against it. Once you are able to get out of bed, walk around indoors and cough well. You may stop using the incentive spirometer when instructed by your caregiver.  RISKS AND COMPLICATIONS  Take your time so you do not get dizzy or light-headed.  If you are in pain, you may need to take or ask for pain medication before doing incentive spirometry. It is harder to take a deep breath if you are having pain. AFTER USE  Rest and breathe slowly and easily.  It can be helpful to keep track of a log of your progress. Your caregiver can provide you with a simple table to help with this. If you are using the spirometer at home, follow these instructions: [place] IF:   You are having difficultly using the spirometer.  You have trouble using the spirometer as often as instructed.  Your pain medication is not giving enough relief while using the spirometer.  You develop fever of 100.5 F (38.1 C) or higher. SEEK IMMEDIATE MEDICAL CARE IF:   You cough up bloody sputum that had not been present before.  You develop fever of 102 F (38.9 C) or greater.  You develop worsening pain at or near the incision site. MAKE SURE YOU:   Understand these instructions.  Will watch your condition.  Will get help right away if you are not doing well or get worse. Document Released: 02/26/2007 Document Revised: 01/08/2012 Document Reviewed: 04/29/2007 ExitCare Patient Information 2014 ExitCare, [place].   ________________________________________________________________________  WHAT IS A BLOOD TRANSFUSION? Blood Transfusion Information  A transfusion is the replacement of blood or some of its parts.  Blood is made up of multiple cells which provide different functions.  Red blood cells carry oxygen and are used for blood loss replacement.  White blood cells fight against infection.  Platelets control bleeding.  Plasma helps clot blood.  Other blood products are available for specialized needs, such as hemophilia or other clotting disorders. BEFORE THE TRANSFUSION  Who gives blood for transfusions?   Healthy volunteers who are fully evaluated to make sure their blood is safe. This is blood bank blood. Transfusion therapy is the safest it has ever been in the practice of medicine. Before blood is taken from a donor, a complete history is taken to make sure that person has no history of diseases nor engages in risky social behavior (examples are intravenous drug use or sexual activity with multiple partners). The donor's travel history is screened to minimize risk of transmitting infections, such as malaria. The donated blood is tested for signs of infectious diseases, such as HIV and hepatitis. The blood is then tested to be sure it is compatible with you in order to minimize the chance of a transfusion reaction. If you or a relative donates blood, this is often done in anticipation of surgery and is not appropriate for emergency situations. It takes many days to process the donated blood. RISKS AND COMPLICATIONS Although transfusion therapy is very safe and saves many lives, the main dangers of transfusion include:   Getting an infectious disease.  Developing a transfusion reaction. This is an allergic reaction to something in the blood you were given. Every precaution is taken to prevent this. The decision to have a blood transfusion has been considered carefully by your caregiver before blood is given. Blood is not given unless the benefits outweigh the risks. AFTER THE TRANSFUSION  Right after receiving a blood transfusion, you will usually feel much better and more energetic. This is  especially true if your red blood cells have gotten low (anemic). The transfusion raises the level of the red blood cells which carry oxygen, and this usually causes an energy increase.  The nurse administering the transfusion will monitor you carefully for complications. HOME CARE INSTRUCTIONS  No special instructions are needed after a transfusion. You may find your energy is better. Speak with your caregiver about any limitations on activity for underlying diseases you may have. SEEK MEDICAL CARE IF:   Your condition is not improving after your transfusion.  You develop redness or irritation at the intravenous (IV) site. SEEK IMMEDIATE MEDICAL CARE IF:  Any of the following symptoms occur over the next 12 hours:  Shaking chills.  You have a temperature by mouth above 102 F (38.9 C), not controlled by medicine.  Chest, back, or muscle pain.  People around you feel you are not acting correctly or are confused.  Shortness of breath or difficulty breathing.  Dizziness and fainting.  You get a rash or develop hives.  You have a decrease in urine output.  Your urine turns a dark color or changes to pink, red, or brown. Any of the following symptoms occur over the next 10 days:  You have a temperature by mouth above 102 F (38.9 C), not controlled by medicine.  Shortness of breath.  Weakness after normal activity.  The white part of the eye turns yellow (jaundice).  You have a decrease in the amount of urine or are urinating less often.  Your urine turns a dark color or changes to pink, red, or brown. Document Released: 10/13/2000 Document Revised: 01/08/2012 Document Reviewed: 06/01/2008 [employer] Patient Information 2014 [place], [place].  _______________________________________________________________________

## 2019-12-05 NOTE — Progress Notes (Addendum)
PCP - Dr. Vonna Kotyk [place]ting[place] Cardiologist -  Dr. Carlyle Dolly  televisit 11/17/2019 epic Hematology- Dr. Bosie Helper [place]  LOV-11/18/2019  Chest x-ray - n/a EKG - 11/06/2019 epic Stress Test - 04/10/2002 epic ECHO - 12/08/2017 epic Cardiac Cath -12/24/2009 epic   Sleep Study - n/a CPAP - n/a  Fasting Blood Sugar - does not check blood glucose Checks Blood Sugar _0____ times a day  Blood Thinner Instructions:n/a Aspirin Instructions:Aspirin EC 81 mg for preventative treatment.   Instructed by Dr. Harl Bowie to stop on 11/30/2019. Last Dose:11/30/2019 Also stopped Naproxen on 11/30/2019. Also was instructed to hold Hydrea from Dr. Denman George Kem Parkinson) the day before surgery!   Anesthesia review:   Chart to be reviewed by Konrad Felix, PA for medical history and labs.  Patient has a history of HTN, LBBB, Diabetes Mellitus Type 2 , thrombocythemia, endometrial cancer, had atrial septal defect repair in 1994.  Patient denies shortness of breath, fever, cough and chest pain at PAT appointment   Patient verbalized understanding of instructions that were given to them at the PAT appointment. Patient was also instructed that they will need to review over the PAT instructions again at home before surgery.

## 2019-12-05 NOTE — Progress Notes (Signed)
LM on VM [phone]  To call back to go over instructions for surgery.

## 2019-12-08 ENCOUNTER — Telehealth: Payer: Self-pay | Admitting: *Deleted

## 2019-12-08 NOTE — Telephone Encounter (Signed)
Pt verbalizes understanding of pre op instructions.  Pt states she has held her hydroxyurea dose today and that she stopped taking aspirin "over a week ago."  No questions or concerns voiced at this time.

## 2019-12-09 ENCOUNTER — Ambulatory Visit (HOSPITAL_COMMUNITY): Payer: Medicare Other | Admitting: Physician Assistant

## 2019-12-09 ENCOUNTER — Other Ambulatory Visit: Payer: Self-pay

## 2019-12-09 ENCOUNTER — Ambulatory Visit (HOSPITAL_COMMUNITY)
Admission: RE | Admit: 2019-12-09 | Discharge: 2019-12-10 | Disposition: A | Discharge status: Home / Self Care | Payer: Medicare Other | Source: Other Acute Inpatient Hospital | Attending: Gynecologic Oncology | Admitting: Gynecologic Oncology

## 2019-12-09 ENCOUNTER — Encounter (HOSPITAL_COMMUNITY)
Admission: RE | Disposition: A | Discharge status: Home / Self Care | Payer: Self-pay | Source: Other Acute Inpatient Hospital | Attending: Gynecologic Oncology

## 2019-12-09 ENCOUNTER — Encounter (HOSPITAL_COMMUNITY): Payer: Self-pay | Admitting: Gynecologic Oncology

## 2019-12-09 DIAGNOSIS — Z9842 Cataract extraction status, left eye: Secondary | ICD-10-CM | POA: Diagnosis not present

## 2019-12-09 DIAGNOSIS — Z7982 Long term (current) use of aspirin: Secondary | ICD-10-CM | POA: Insufficient documentation

## 2019-12-09 DIAGNOSIS — Z961 Presence of intraocular lens: Secondary | ICD-10-CM | POA: Insufficient documentation

## 2019-12-09 DIAGNOSIS — Z79899 Other long term (current) drug therapy: Secondary | ICD-10-CM | POA: Diagnosis not present

## 2019-12-09 DIAGNOSIS — Z885 Allergy status to narcotic agent status: Secondary | ICD-10-CM | POA: Insufficient documentation

## 2019-12-09 DIAGNOSIS — E785 Hyperlipidemia, unspecified: Secondary | ICD-10-CM | POA: Insufficient documentation

## 2019-12-09 DIAGNOSIS — I472 Ventricular tachycardia: Secondary | ICD-10-CM | POA: Insufficient documentation

## 2019-12-09 DIAGNOSIS — E119 Type 2 diabetes mellitus without complications: Secondary | ICD-10-CM | POA: Insufficient documentation

## 2019-12-09 DIAGNOSIS — Z803 Family history of malignant neoplasm of breast: Secondary | ICD-10-CM | POA: Insufficient documentation

## 2019-12-09 DIAGNOSIS — D693 Immune thrombocytopenic purpura: Secondary | ICD-10-CM | POA: Insufficient documentation

## 2019-12-09 DIAGNOSIS — K219 Gastro-esophageal reflux disease without esophagitis: Secondary | ICD-10-CM | POA: Insufficient documentation

## 2019-12-09 DIAGNOSIS — N800 Endometriosis of uterus: Secondary | ICD-10-CM | POA: Insufficient documentation

## 2019-12-09 DIAGNOSIS — Z809 Family history of malignant neoplasm, unspecified: Secondary | ICD-10-CM | POA: Insufficient documentation

## 2019-12-09 DIAGNOSIS — E1169 Type 2 diabetes mellitus with other specified complication: Secondary | ICD-10-CM

## 2019-12-09 DIAGNOSIS — I509 Heart failure, unspecified: Secondary | ICD-10-CM | POA: Insufficient documentation

## 2019-12-09 DIAGNOSIS — D251 Intramural leiomyoma of uterus: Secondary | ICD-10-CM | POA: Diagnosis not present

## 2019-12-09 DIAGNOSIS — Z823 Family history of stroke: Secondary | ICD-10-CM | POA: Insufficient documentation

## 2019-12-09 DIAGNOSIS — Y838 Other surgical procedures as the cause of abnormal reaction of the patient, or of later complication, without mention of misadventure at the time of the procedure: Secondary | ICD-10-CM | POA: Insufficient documentation

## 2019-12-09 DIAGNOSIS — J95821 Acute postprocedural respiratory failure: Secondary | ICD-10-CM | POA: Diagnosis not present

## 2019-12-09 DIAGNOSIS — I110 Hypertensive heart disease with heart failure: Secondary | ICD-10-CM | POA: Diagnosis not present

## 2019-12-09 DIAGNOSIS — Z791 Long term (current) use of non-steroidal anti-inflammatories (NSAID): Secondary | ICD-10-CM | POA: Insufficient documentation

## 2019-12-09 DIAGNOSIS — Z86010 Personal history of colonic polyps: Secondary | ICD-10-CM | POA: Diagnosis not present

## 2019-12-09 DIAGNOSIS — Z87442 Personal history of urinary calculi: Secondary | ICD-10-CM | POA: Insufficient documentation

## 2019-12-09 DIAGNOSIS — I447 Left bundle-branch block, unspecified: Secondary | ICD-10-CM | POA: Insufficient documentation

## 2019-12-09 DIAGNOSIS — F419 Anxiety disorder, unspecified: Secondary | ICD-10-CM | POA: Insufficient documentation

## 2019-12-09 DIAGNOSIS — Q211 Atrial septal defect: Secondary | ICD-10-CM | POA: Diagnosis not present

## 2019-12-09 DIAGNOSIS — C541 Malignant neoplasm of endometrium: Secondary | ICD-10-CM | POA: Diagnosis present

## 2019-12-09 DIAGNOSIS — Z8249 Family history of ischemic heart disease and other diseases of the circulatory system: Secondary | ICD-10-CM | POA: Diagnosis not present

## 2019-12-09 HISTORY — PX: ROBOTIC ASSISTED LAPAROSCOPIC HYSTERECTOMY AND SALPINGECTOMY: SHX6379

## 2019-12-09 HISTORY — PX: SENTINEL NODE BIOPSY: SHX6608

## 2019-12-09 LAB — BLOOD GAS, ARTERIAL
Acid-Base Excess: 0.4 mmol/L (ref 0.0–2.0)
Acid-base deficit: 0.9 mmol/L (ref 0.0–2.0)
Allens test (pass/fail): NEGATIVE — AB
Allens test (pass/fail): POSITIVE — AB
Bicarbonate: 25.8 mmol/L (ref 20.0–28.0)
Bicarbonate: 25.5 mmol/L (ref 20.0–28.0)
Drawn by: 25770
Drawn by: 25770
Expiratory PAP: 8
FIO2: 30
Inspiratory PAP: 18
O2 Content: 7 L/min
O2 Saturation: 98.2 %
O2 Saturation: 96.8 %
Patient temperature: 98.7
Patient temperature: 98.7
RATE: 18 resp/min
pCO2 arterial: 50 mmHg — ABNORMAL HIGH (ref 32.0–48.0)
pCO2 arterial: 44.9 mmHg (ref 32.0–48.0)
pH, Arterial: 7.332 — ABNORMAL LOW (ref 7.350–7.450)
pH, Arterial: 7.372 (ref 7.350–7.450)
pO2, Arterial: 142 mmHg — ABNORMAL HIGH (ref 83.0–108.0)
pO2, Arterial: 96 mmHg (ref 83.0–108.0)

## 2019-12-09 LAB — GLUCOSE, CAPILLARY
Glucose-Capillary: 130 mg/dL — ABNORMAL HIGH (ref 70–99)
Glucose-Capillary: 182 mg/dL — ABNORMAL HIGH (ref 70–99)
Glucose-Capillary: 153 mg/dL — ABNORMAL HIGH (ref 70–99)
Glucose-Capillary: 213 mg/dL — ABNORMAL HIGH (ref 70–99)

## 2019-12-09 LAB — TYPE AND SCREEN
ABO/RH(D): A POS
Antibody Screen: NEGATIVE

## 2019-12-09 LAB — MRSA PCR SCREENING: MRSA by PCR: NEGATIVE

## 2019-12-09 SURGERY — XI ROBOTIC ASSISTED LAPAROSCOPIC HYSTERECTOMY AND SALPINGECTOMY
Anesthesia: General

## 2019-12-09 MED ORDER — PROPOFOL 10 MG/ML IV BOLUS
INTRAVENOUS | Status: AC
  Filled 2019-12-09: qty 20

## 2019-12-09 MED ORDER — ONDANSETRON HCL 4 MG/2ML IJ SOLN
INTRAMUSCULAR | Status: AC
  Filled 2019-12-09: qty 2

## 2019-12-09 MED ORDER — DEXAMETHASONE SODIUM PHOSPHATE 4 MG/ML IJ SOLN: 4.0000 mg | INTRAMUSCULAR | Status: DC

## 2019-12-09 MED ORDER — SODIUM CHLORIDE 0.9% FLUSH
3.0000 mL | Freq: Two times a day (BID) | INTRAVENOUS | Status: DC
  Administered 2019-12-09: 3 mL via INTRAVENOUS

## 2019-12-09 MED ORDER — ENOXAPARIN SODIUM 40 MG/0.4ML ~~LOC~~ SOLN
40.0000 mg | SUBCUTANEOUS | Status: AC
  Administered 2019-12-09: 09:00:00 40 mg via SUBCUTANEOUS
  Filled 2019-12-09: qty 0.4

## 2019-12-09 MED ORDER — KCL IN DEXTROSE-NACL 20-5-0.45 MEQ/L-%-% IV SOLN
INTRAVENOUS | Status: DC
  Filled 2019-12-09 (×2): qty 1000

## 2019-12-09 MED ORDER — LIDOCAINE 2% (20 MG/ML) 5 ML SYRINGE
INTRAMUSCULAR | Status: AC
  Filled 2019-12-09: qty 5

## 2019-12-09 MED ORDER — ROCURONIUM BROMIDE 10 MG/ML (PF) SYRINGE
PREFILLED_SYRINGE | INTRAVENOUS | Status: DC | PRN
  Administered 2019-12-09: 20 mg via INTRAVENOUS
  Administered 2019-12-09: 50 mg via INTRAVENOUS
  Administered 2019-12-09: 20 mg via INTRAVENOUS

## 2019-12-09 MED ORDER — EPHEDRINE 5 MG/ML INJ
INTRAVENOUS | Status: AC
  Filled 2019-12-09: qty 10

## 2019-12-09 MED ORDER — STERILE WATER FOR INJECTION IJ SOLN
INTRAMUSCULAR | Status: DC | PRN
  Administered 2019-12-09: 11:00:00 4 mL

## 2019-12-09 MED ORDER — OXYCODONE HCL 5 MG PO TABS: 5.0000 mg | ORAL_TABLET | Freq: Once | ORAL | Status: DC | PRN

## 2019-12-09 MED ORDER — HYDROMORPHONE HCL 1 MG/ML IJ SOLN: 0.5000 mg | INTRAMUSCULAR | Status: DC | PRN

## 2019-12-09 MED ORDER — NALOXONE HCL 0.4 MG/ML IJ SOLN
INTRAMUSCULAR | Status: AC
  Filled 2019-12-09: qty 1

## 2019-12-09 MED ORDER — EPHEDRINE SULFATE-NACL 50-0.9 MG/10ML-% IV SOSY
PREFILLED_SYRINGE | INTRAVENOUS | Status: DC | PRN
  Administered 2019-12-09 (×5): 5 mg via INTRAVENOUS

## 2019-12-09 MED ORDER — ORAL CARE MOUTH RINSE
15.0000 mL | Freq: Two times a day (BID) | OROMUCOSAL | Status: DC
  Administered 2019-12-10: 15 mL via OROMUCOSAL

## 2019-12-09 MED ORDER — ROCURONIUM BROMIDE 10 MG/ML (PF) SYRINGE
PREFILLED_SYRINGE | INTRAVENOUS | Status: AC
  Filled 2019-12-09: qty 10

## 2019-12-09 MED ORDER — LORAZEPAM 2 MG/ML IJ SOLN: 0.5000 mg | Freq: Four times a day (QID) | INTRAMUSCULAR | Status: DC | PRN

## 2019-12-09 MED ORDER — DEXAMETHASONE SODIUM PHOSPHATE 10 MG/ML IJ SOLN
INTRAMUSCULAR | Status: DC | PRN
  Administered 2019-12-09: 4 mg via INTRAVENOUS

## 2019-12-09 MED ORDER — ACETAMINOPHEN 10 MG/ML IV SOLN
1000.0000 mg | Freq: Two times a day (BID) | INTRAVENOUS | Status: AC
  Administered 2019-12-09 – 2019-12-10 (×2): 1000 mg via INTRAVENOUS
  Filled 2019-12-09 (×2): qty 100

## 2019-12-09 MED ORDER — SODIUM CHLORIDE 0.9% FLUSH: 3.0000 mL | INTRAVENOUS | Status: DC | PRN

## 2019-12-09 MED ORDER — FENTANYL CITRATE (PF) 100 MCG/2ML IJ SOLN
INTRAMUSCULAR | Status: DC | PRN
  Administered 2019-12-09 (×3): 50 ug via INTRAVENOUS

## 2019-12-09 MED ORDER — ONDANSETRON HCL 4 MG PO TABS: 4.0000 mg | ORAL_TABLET | Freq: Four times a day (QID) | ORAL | Status: DC | PRN

## 2019-12-09 MED ORDER — LACTATED RINGERS IR SOLN
Status: DC | PRN
  Administered 2019-12-09: 1

## 2019-12-09 MED ORDER — ACETAMINOPHEN 500 MG PO TABS
1000.0000 mg | ORAL_TABLET | ORAL | Status: AC
  Administered 2019-12-09: 09:00:00 1000 mg via ORAL
  Filled 2019-12-09: qty 2

## 2019-12-09 MED ORDER — PROPOFOL 10 MG/ML IV BOLUS
INTRAVENOUS | Status: DC | PRN
  Administered 2019-12-09: 120 mg via INTRAVENOUS

## 2019-12-09 MED ORDER — OXYCODONE HCL 5 MG/5ML PO SOLN: 5.0000 mg | Freq: Once | ORAL | Status: DC | PRN

## 2019-12-09 MED ORDER — LIDOCAINE 20MG/ML (2%) 15 ML SYRINGE OPTIME
INTRAMUSCULAR | Status: DC | PRN
  Administered 2019-12-09: 1.5 mg/kg/h via INTRAVENOUS

## 2019-12-09 MED ORDER — CHLORHEXIDINE GLUCONATE CLOTH 2 % EX PADS
6.0000 | MEDICATED_PAD | Freq: Every day | CUTANEOUS | Status: DC
  Administered 2019-12-09: 6 via TOPICAL

## 2019-12-09 MED ORDER — ONDANSETRON HCL 4 MG/2ML IJ SOLN
INTRAMUSCULAR | Status: DC | PRN
  Administered 2019-12-09: 4 mg via INTRAVENOUS

## 2019-12-09 MED ORDER — FENTANYL CITRATE (PF) 100 MCG/2ML IJ SOLN: 25.0000 ug | INTRAMUSCULAR | Status: DC | PRN

## 2019-12-09 MED ORDER — ACETAMINOPHEN 650 MG RE SUPP: 650.0000 mg | RECTAL | Status: DC | PRN

## 2019-12-09 MED ORDER — CEFAZOLIN SODIUM-DEXTROSE 2-4 GM/100ML-% IV SOLN
2.0000 g | INTRAVENOUS | Status: AC
  Administered 2019-12-09: 2 g via INTRAVENOUS
  Filled 2019-12-09: qty 100

## 2019-12-09 MED ORDER — ONDANSETRON HCL 4 MG/2ML IJ SOLN: 4.0000 mg | Freq: Four times a day (QID) | INTRAMUSCULAR | Status: DC | PRN

## 2019-12-09 MED ORDER — SODIUM CHLORIDE 0.9 % IV SOLN: 250.0000 mL | INTRAVENOUS | Status: DC | PRN

## 2019-12-09 MED ORDER — PANTOPRAZOLE SODIUM 40 MG IV SOLR
40.0000 mg | INTRAVENOUS | Status: DC
  Administered 2019-12-10: 40 mg via INTRAVENOUS
  Filled 2019-12-09: qty 40

## 2019-12-09 MED ORDER — HYDRALAZINE HCL 20 MG/ML IJ SOLN: 10.0000 mg | Freq: Four times a day (QID) | INTRAMUSCULAR | Status: DC | PRN

## 2019-12-09 MED ORDER — FENTANYL CITRATE (PF) 100 MCG/2ML IJ SOLN
INTRAMUSCULAR | Status: AC
  Filled 2019-12-09: qty 2

## 2019-12-09 MED ORDER — SUGAMMADEX SODIUM 200 MG/2ML IV SOLN
INTRAVENOUS | Status: DC | PRN
  Administered 2019-12-09: 200 mg via INTRAVENOUS

## 2019-12-09 MED ORDER — HYDROMORPHONE HCL 2 MG PO TABS: 2.0000 mg | ORAL_TABLET | ORAL | Status: DC | PRN

## 2019-12-09 MED ORDER — ACETAMINOPHEN 325 MG PO TABS
650.0000 mg | ORAL_TABLET | ORAL | Status: DC | PRN
  Administered 2019-12-10: 650 mg via ORAL
  Filled 2019-12-09: qty 2

## 2019-12-09 MED ORDER — DEXAMETHASONE SODIUM PHOSPHATE 10 MG/ML IJ SOLN
INTRAMUSCULAR | Status: AC
  Filled 2019-12-09: qty 1

## 2019-12-09 MED ORDER — INSULIN ASPART 100 UNIT/ML ~~LOC~~ SOLN
0.0000 [IU] | Freq: Four times a day (QID) | SUBCUTANEOUS | Status: DC
  Administered 2019-12-09: 3 [IU] via SUBCUTANEOUS
  Administered 2019-12-09: 2 [IU] via SUBCUTANEOUS

## 2019-12-09 MED ORDER — METOPROLOL TARTRATE 5 MG/5ML IV SOLN
5.0000 mg | Freq: Four times a day (QID) | INTRAVENOUS | Status: DC
  Administered 2019-12-09 – 2019-12-10 (×3): 5 mg via INTRAVENOUS
  Filled 2019-12-09 (×3): qty 5

## 2019-12-09 MED ORDER — BUPIVACAINE HCL 0.25 % IJ SOLN
INTRAMUSCULAR | Status: AC
  Filled 2019-12-09: qty 1

## 2019-12-09 MED ORDER — ENOXAPARIN SODIUM 40 MG/0.4ML ~~LOC~~ SOLN: 40.0000 mg | SUBCUTANEOUS | Status: DC

## 2019-12-09 MED ORDER — LIDOCAINE 2% (20 MG/ML) 5 ML SYRINGE
INTRAMUSCULAR | Status: DC | PRN
  Administered 2019-12-09: 60 mg via INTRAVENOUS

## 2019-12-09 MED ORDER — ONDANSETRON HCL 4 MG/2ML IJ SOLN: 4.0000 mg | Freq: Once | INTRAMUSCULAR | Status: DC | PRN

## 2019-12-09 MED ORDER — LACTATED RINGERS IV SOLN: INTRAVENOUS | Status: DC

## 2019-12-09 MED ORDER — BUPIVACAINE HCL 0.25 % IJ SOLN
INTRAMUSCULAR | Status: DC | PRN
  Administered 2019-12-09: 16 mL

## 2019-12-09 MED ORDER — STERILE WATER FOR INJECTION IJ SOLN
INTRAMUSCULAR | Status: AC
  Filled 2019-12-09: qty 10

## 2019-12-09 MED ORDER — LIDOCAINE HCL 2 % IJ SOLN
INTRAMUSCULAR | Status: AC
  Filled 2019-12-09: qty 20

## 2019-12-09 MED ORDER — GABAPENTIN 300 MG PO CAPS
300.0000 mg | ORAL_CAPSULE | ORAL | Status: AC
  Administered 2019-12-09: 300 mg via ORAL
  Filled 2019-12-09: qty 1

## 2019-12-09 SURGICAL SUPPLY — 68 items
APPLICATOR SURGIFLO ENDO (HEMOSTASIS) IMPLANT
BACTOSHIELD CHG 4% 4OZ (MISCELLANEOUS) ×1
BAG LAPAROSCOPIC 12 15 PORT 16 (BASKET) IMPLANT
BAG RETRIEVAL 12/15 (BASKET)
BLADE SURG SZ10 CARB STEEL (BLADE) IMPLANT
COVER BACK TABLE 60X90IN (DRAPES) ×3 IMPLANT
COVER SURGICAL LIGHT HANDLE (MISCELLANEOUS) ×3 IMPLANT
COVER TIP SHEARS 8 DVNC (MISCELLANEOUS) ×2 IMPLANT
COVER TIP SHEARS 8MM DA VINCI (MISCELLANEOUS) ×1
COVER WAND RF STERILE (DRAPES) IMPLANT
DECANTER SPIKE VIAL GLASS SM (MISCELLANEOUS) ×3 IMPLANT
DERMABOND ADVANCED (GAUZE/BANDAGES/DRESSINGS) ×2
DERMABOND ADVANCED .7 DNX12 (GAUZE/BANDAGES/DRESSINGS) ×4 IMPLANT
DRAPE ARM DVNC X/XI (DISPOSABLE) ×8 IMPLANT
DRAPE COLUMN DVNC XI (DISPOSABLE) ×2 IMPLANT
DRAPE DA VINCI XI ARM (DISPOSABLE) ×4
DRAPE DA VINCI XI COLUMN (DISPOSABLE) ×1
DRAPE SHEET LG 3/4 BI-LAMINATE (DRAPES) ×3 IMPLANT
DRAPE SURG IRRIG POUCH 19X23 (DRAPES) ×3 IMPLANT
DRSG OPSITE POSTOP 4X6 (GAUZE/BANDAGES/DRESSINGS) IMPLANT
DRSG OPSITE POSTOP 4X8 (GAUZE/BANDAGES/DRESSINGS) IMPLANT
DRSG TEGADERM 2-3/8X2-3/4 SM (GAUZE/BANDAGES/DRESSINGS) ×3 IMPLANT
ELECT REM PT RETURN 15FT ADLT (MISCELLANEOUS) ×3 IMPLANT
GLOVE BIO SURGEON STRL SZ 6 (GLOVE) ×12 IMPLANT
GLOVE BIO SURGEON STRL SZ 6.5 (GLOVE) ×6 IMPLANT
GOWN STRL REUS W/ TWL LRG LVL3 (GOWN DISPOSABLE) ×8 IMPLANT
GOWN STRL REUS W/TWL LRG LVL3 (GOWN DISPOSABLE) ×4
HOLDER FOLEY CATH W/STRAP (MISCELLANEOUS) IMPLANT
IRRIG SUCT STRYKERFLOW 2 WTIP (MISCELLANEOUS) ×3
IRRIGATION SUCT STRKRFLW 2 WTP (MISCELLANEOUS) ×2 IMPLANT
KIT PROCEDURE DA VINCI SI (MISCELLANEOUS) ×1
KIT PROCEDURE DVNC SI (MISCELLANEOUS) ×2 IMPLANT
KIT TURNOVER KIT A (KITS) ×3 IMPLANT
MANIPULATOR UTERINE 4.5 ZUMI (MISCELLANEOUS) ×3 IMPLANT
NEEDLE HYPO 22GX1.5 SAFETY (NEEDLE) ×3 IMPLANT
NEEDLE SPNL 18GX3.5 QUINCKE PK (NEEDLE) ×6 IMPLANT
OBTURATOR OPTICAL STANDARD 8MM (TROCAR) ×1
OBTURATOR OPTICAL STND 8 DVNC (TROCAR) ×2
OBTURATOR OPTICALSTD 8 DVNC (TROCAR) ×2 IMPLANT
PACK ROBOT GYN CUSTOM WL (TRAY / TRAY PROCEDURE) ×3 IMPLANT
PAD POSITIONING PINK XL (MISCELLANEOUS) ×3 IMPLANT
PENCIL SMOKE EVACUATOR (MISCELLANEOUS) IMPLANT
PORT ACCESS TROCAR AIRSEAL 12 (TROCAR) ×2 IMPLANT
PORT ACCESS TROCAR AIRSEAL 5M (TROCAR) ×1
POUCH SPECIMEN RETRIEVAL 10MM (ENDOMECHANICALS) IMPLANT
SCRUB CHG 4% DYNA-HEX 4OZ (MISCELLANEOUS) ×2 IMPLANT
SEAL CANN UNIV 5-8 DVNC XI (MISCELLANEOUS) ×8 IMPLANT
SEAL XI 5MM-8MM UNIVERSAL (MISCELLANEOUS) ×4
SET TRI-LUMEN FLTR TB AIRSEAL (TUBING) ×3 IMPLANT
SPONGE LAP 18X18 RF (DISPOSABLE) IMPLANT
SURGIFLO W/THROMBIN 8M KIT (HEMOSTASIS) IMPLANT
SUT MNCRL AB 4-0 PS2 18 (SUTURE) IMPLANT
SUT PDS AB 1 TP1 96 (SUTURE) IMPLANT
SUT VIC AB 0 CT1 27 (SUTURE)
SUT VIC AB 0 CT1 27XBRD ANTBC (SUTURE) IMPLANT
SUT VIC AB 2-0 CT1 27 (SUTURE)
SUT VIC AB 2-0 CT1 TAPERPNT 27 (SUTURE) IMPLANT
SUT VIC AB 3-0 SH 27 (SUTURE) ×1
SUT VIC AB 3-0 SH 27XBRD (SUTURE) ×2 IMPLANT
SUT VICRYL 4-0 PS2 18IN ABS (SUTURE) ×6 IMPLANT
SYR 10ML LL (SYRINGE) ×6 IMPLANT
TOWEL OR NON WOVEN STRL DISP B (DISPOSABLE) ×3 IMPLANT
TRAP SPECIMEN MUCOUS 40CC (MISCELLANEOUS) IMPLANT
TRAY FOLEY MTR SLVR 16FR STAT (SET/KITS/TRAYS/PACK) ×3 IMPLANT
TROCAR XCEL NON-BLD 5MMX100MML (ENDOMECHANICALS) IMPLANT
UNDERPAD 30X36 HEAVY ABSORB (UNDERPADS AND DIAPERS) ×3 IMPLANT
WATER STERILE IRR 1000ML POUR (IV SOLUTION) ×3 IMPLANT
YANKAUER SUCT BULB TIP 10FT TU (MISCELLANEOUS) IMPLANT

## 2019-12-09 NOTE — Addendum Note (Signed)
Addendum  created 12/09/19 1517 by Pervis Hocking, DO   Clinical Note Signed, Order list changed

## 2019-12-09 NOTE — Op Note (Signed)
OPERATIVE NOTE 12/09/19  Surgeon: Donaciano Eva   Assistants: Dr Lahoma Crocker (an MD assistant was necessary for tissue manipulation, management of robotic instrumentation, retraction and positioning due to the complexity of the case and hospital policies).   Anesthesia: General endotracheal anesthesia  ASA Class: 3   Pre-operative Diagnosis: endometrial cancer grade 1  Post-operative Diagnosis: same,   Operation: Robotic-assisted laparoscopic total hysterectomy with bilateral salpingoophorectomy, SLN biopsy   Surgeon: Donaciano Eva  Assistant Surgeon: Lahoma Crocker MD  Anesthesia: GET  Urine Output: 300cc  Operative Findings:  : 6cm uterus, normal tubes and ovaries, no suspicious nodes  Estimated Blood Loss:  20cc      Total IV Fluids: 900 ml         Specimens: uterus, cervix, bilateral tubes and ovaries, left and right external iliac SLNs         Complications:  None; patient tolerated the procedure well.         Disposition: PACU - hemodynamically stable.  Procedure Details  The patient was seen in the Holding Room. The risks, benefits, complications, treatment options, and expected outcomes were discussed with the patient.  The patient concurred with the proposed plan, giving informed consent.  The site of surgery properly noted/marked. The patient was identified as Heather Barber and the procedure verified as a Robotic-assisted hysterectomy with bilateral salpingo oophorectomy with SLN biopsy. A Time Out was held and the above information confirmed.  After induction of anesthesia, the patient was draped and prepped in the usual sterile manner. Pt was placed in supine position after anesthesia and draped and prepped in the usual sterile manner. The abdominal drape was placed after the CholoraPrep had been allowed to dry for 3 minutes.  Her arms were tucked to her side with all appropriate precautions.  The shoulders were stabilized with padded  shoulder blocks applied to the acromium processes.  The patient was placed in the semi-lithotomy position in [place].  The perineum was prepped with Betadine. The patient was then prepped. Foley catheter was placed.  A sterile speculum was placed in the vagina.  The cervix was grasped with a single-tooth tenaculum. 2mg  total of ICG was injected into the cervical stroma at 2 and 9 o'clock with 1cc injected at a 1cm and 50mm depth (concentration 0.5mg /ml) in all locations. The cervix was dilated with Kennon Rounds dilators.  The ZUMI uterine manipulator with a medium colpotomizer ring was placed without difficulty.  A pneum occluder balloon was placed over the manipulator.  OG tube placement was confirmed and to suction.   Next, a 5 mm skin incision was made 1 cm below the subcostal margin in the midclavicular line.  The 5 mm Optiview port and scope was used for direct entry.  Opening pressure was under 10 mm CO2.  The abdomen was insufflated and the findings were noted as above.   At this point and all points during the procedure, the patient's intra-abdominal pressure did not exceed 15 mmHg. Next, a 10 mm skin incision was made in the umbilicus and a right and left port was placed about 10 cm lateral to the robot port on the right and left side.  A fourth arm was placed in the left lower quadrant 2 cm above and superior and medial to the anterior superior iliac spine.  All ports were placed under direct visualization.  The patient was placed in steep Trendelenburg.  Bowel was folded away into the upper abdomen.  The robot was docked  in the normal manner.  The right and left peritoneum were opened parallel to the IP ligament to open the retroperitoneal spaces bilaterally. The SLN mapping was performed in bilateral pelvic basins. The para rectal and paravesical spaces were opened up entirely with careful dissection below the level of the ureters bilaterally and to the depth of the uterine artery origin in order to  skeletonize the uterine "web" and ensure visualization of all parametrial channels. The para-aortic basins were carefully exposed and evaluated for isolated para-aortic SLN's. Lymphatic channels were identified travelling to the following visualized sentinel lymph node's: left and right external iliac SLNs. These SLN's were separated from their surrounding lymphatic tissue, removed and sent for permanent pathology.  The hysterectomy was started after the round ligament on the right side was incised and the retroperitoneum was entered and the pararectal space was developed.  The ureter was noted to be on the medial leaf of the broad ligament.  The peritoneum above the ureter was incised and stretched and the infundibulopelvic ligament was skeletonized, cauterized and cut.  The posterior peritoneum was taken down to the level of the KOH ring.  The anterior peritoneum was also taken down.  The bladder flap was created to the level of the KOH ring.  The uterine artery on the right side was skeletonized, cauterized and cut in the normal manner.  A similar procedure was performed on the left.  The colpotomy was made and the uterus, cervix, bilateral ovaries and tubes were amputated and delivered through the vagina.  Pedicles were inspected and excellent hemostasis was achieved.    The colpotomy at the vaginal cuff was closed with Vicryl on a CT1 needle in a running manner.  Irrigation was used and excellent hemostasis was achieved.  At this point in the procedure was completed.  Robotic instruments were removed under direct visulaization.  The robot was undocked. The 10 mm ports were closed with Vicryl on a UR-5 needle and the fascia was closed with 0 Vicryl on a UR-5 needle.  The skin was closed with 4-0 Vicryl in a subcuticular manner.  Dermabond was applied.  Sponge, lap and needle counts correct x 2.  The patient was taken to the recovery room in stable condition.  The vagina was swabbed with  minimal bleeding  noted.   All instrument and needle counts were correct x  3.   The patient was transferred to the recovery room in a stable condition.  Donaciano Eva, MD

## 2019-12-09 NOTE — Interval H&P Note (Signed)
History and Physical Interval Note:  12/09/2019 9:46 AM  [name]  has presented today for surgery, with the diagnosis of ENDOMETRIAL CANCER.  The various methods of treatment have been discussed with the patient and family. After consideration of risks, benefits and other options for treatment, the patient has consented to  Procedure(s): XI ROBOTIC ASSISTED LAPAROSCOPIC HYSTERECTOMY BILATERAL SALPINGOOOPHORECTOMY (Bilateral) SENTINEL LYMPH  NODE BIOPSY (N/A) as a surgical intervention.  The patient's history has been reviewed, patient examined, no change in status, stable for surgery.  I have reviewed the patient's chart and labs.  Questions were answered to the patient's satisfaction.     Heather Barber

## 2019-12-09 NOTE — Progress Notes (Signed)
Assisted with transporting PT from [facility] PACU to [facility] ICU while on BiPAP- uneventful. Once in ICU PT was removed from BiPAP for mouth care and placed on 2 lpm nasal cannula (no 02 usage at home). PT appears to be in no respiratory distress and is alert and oriented at this time. BiPAP remains at bedside and encouraged RN to notify RT if PT needs to resume usage. CCM MD at bedside and evaluating PT.

## 2019-12-09 NOTE — Anesthesia Procedure Notes (Signed)
Procedure Name: Intubation Date/Time: 12/09/2019 10:22 AM Performed by: Eben Burow, CRNA Pre-anesthesia Checklist: Patient identified, Emergency Drugs available, Suction available, Patient being monitored and Timeout performed Patient Re-evaluated:Patient Re-evaluated prior to induction Oxygen Delivery Method: Circle system utilized Preoxygenation: Pre-oxygenation with 100% oxygen Induction Type: IV induction Ventilation: Mask ventilation without difficulty Laryngoscope Size: Mac and 4 Grade View: Grade I Tube type: Oral Tube size: 7.0 mm Number of attempts: 1 Airway Equipment and Method: Stylet Placement Confirmation: ETT inserted through vocal cords under direct vision,  positive ETCO2 and breath sounds checked- equal and bilateral Secured at: 21 cm Tube secured with: Tape Dental Injury: Teeth and Oropharynx as per pre-operative assessment

## 2019-12-09 NOTE — Transfer of Care (Signed)
Immediate Anesthesia Transfer of Care Note  Patient: Heather Barber  Procedure(s) Performed: XI ROBOTIC ASSISTED LAPAROSCOPIC HYSTERECTOMY BILATERAL SALPINGOOOPHORECTOMY (Bilateral ) SENTINEL LYMPH  NODE BIOPSY (N/A )  Patient Location: PACU  Anesthesia Type:General  Level of Consciousness: awake, drowsy and patient cooperative  Airway & Oxygen Therapy: Patient Spontanous Breathing and Patient connected to face mask oxygen  Post-op Assessment: Report given to RN and Post -op Vital signs reviewed and stable  Post vital signs: Reviewed and stable  Last Vitals:  Vitals Value Taken Time  BP 164/93 12/09/19 1219  Temp    Pulse 77 12/09/19 1220  Resp 16 12/09/19 1220  SpO2 98 % 12/09/19 1220  Vitals shown include unvalidated device data.  Last Pain:  Vitals:   12/09/19 0847  TempSrc:   PainSc: 0-No pain         Complications: No apparent anesthesia complications

## 2019-12-09 NOTE — Anesthesia Preprocedure Evaluation (Signed)
Anesthesia Evaluation  Patient identified by MRN, date of birth, ID band Patient awake    Reviewed: Allergy & Precautions, NPO status , Patient's Chart, lab work & pertinent test results, reviewed documented beta blocker date and time   History of Anesthesia Complications (+) PONVNegative for: history of anesthetic complications  Airway Mallampati: III  TM Distance: >3 FB Neck ROM: Full    Dental  (+) Edentulous Upper   Pulmonary neg pulmonary ROS,    Pulmonary exam normal        Cardiovascular hypertension, Pt. on medications and Pt. on home beta blockers +CHF  Normal cardiovascular exam+ dysrhythmias Supra Ventricular Tachycardia   H/o palpitations/SVT which resolved with increasing metoprolol. Surgically repaired ASD (1994). Most recent EF 50-55% (2015).   Neuro/Psych negative neurological ROS  negative psych ROS   GI/Hepatic Neg liver ROS, GERD  Medicated,  Endo/Other  diabetes  Renal/GU negative Renal ROS  negative genitourinary   Musculoskeletal negative musculoskeletal ROS (+)   Abdominal   Peds  Hematology negative hematology ROS (+)   Anesthesia Other Findings Echo 2015: EF 50-55%, gr1 dd, mild MR, PASP 31  03/2019 event monitor  7 day monitor  Min HR 65, Max HR 169, Avg HR 96  Rare ventricular ectopy all as isolated PVCs  Occasional supraventricular ectopy in the form of PACs, couplets. Fairly frequent runs of SVT up to 160 bpm  Reproductive/Obstetrics Endometrial cancer                             Anesthesia Physical Anesthesia Plan  ASA: III  Anesthesia Plan: General   Post-op Pain Management:    Induction: Intravenous  PONV Risk Score and Plan: 4 or greater and Ondansetron, Dexamethasone, Treatment may vary due to age or medical condition and Midazolam  Airway Management Planned: Oral ETT  Additional Equipment: None  Intra-op Plan:   Post-operative Plan:  Extubation in OR  Informed Consent: I have reviewed the patients History and Physical, chart, labs and discussed the procedure including the risks, benefits and alternatives for the proposed anesthesia with the patient or authorized representative who has indicated his/her understanding and acceptance.     Dental advisory given  Plan Discussed with:   Anesthesia Plan Comments:         Anesthesia Quick Evaluation

## 2019-12-09 NOTE — Progress Notes (Signed)
Called to assess patient in PACU, minimally responsiveness to voice. Responds to touch and follows commands. EtCO2 off of facemask in the 40s, so I suspect the patient is very hypercarbic after robotic case with CO2 insufflation. Will draw ABG and start BiPAP in the hopes of decreasing CO2. Will recheck ABG in 1 hour- if no improvement in mental status, suggest admitting patient overnight for monitoring and potentially BiPAP overnight.

## 2019-12-09 NOTE — Consult Note (Signed)
NAME:  Heather Barber, MRN:  [id number], DOB:  1945-12-30, LOS: 0 ADMISSION DATE:  12/09/2019, CONSULTATION DATE: 2/9 REFERRING MD: Dr. Denman George, CHIEF COMPLAINT: Placed back on BiPAP due to hypercarbia  Brief History   74 year old female with a history of grade 1 endometrioid endometrial cancer who presented originally for elective laparoscopic hysterectomy and bilateral salpingo-oophorectomy.  PCCM consulted for hypercapnic respiratory failure postanesthesia  History of present illness   Heather Barber is a 74 year old female with a past medical history significant for grade 1 endometrioid endometrial adenocarcinoma, type 2 diabetes, left bundle branch block, hypertension, atrial septal defect repaired 1994, hyperlipidemia, GERD, and anxiety who presented for elective hysterectomy 2/8.  Endometrial cancer was diagnosed November 11, 2019.  Operative report indicates no complications during procedure.  Patient seen minimally responsive to area for which ABG was obtained.  ABG at time as follows 7.33/50/142/25.8.  Patient was placed on BiPAP therapy at this time and PCCM was consulted.  ABG post BiPAP therapy with improved CO2 to 44.9  Past Medical History  Ventricular tachycardia Type 2 diabetes Left bundle branch block Hypertension Atrial septal defect repair 1994 Hyperlipidemia GERD Anxiety  Significant Hospital Events   Admitted 2/9 for elective hysterectomy  Consults:    Procedures:    Significant Diagnostic Tests:    Micro Data:    Antimicrobials:    Interim history/subjective:    Objective   Blood pressure (!) 148/59, pulse 70, temperature 97.8 F (36.6 C), resp. rate 17, height 5\' 4"  (1.626 m), weight 78.9 kg, SpO2 99 %.        Intake/Output Summary (Last 24 hours) at 12/09/2019 1732 Last data filed at 12/09/2019 1400 Gross per 24 hour  Intake 1912 ml  Output 225 ml  Net 1687 ml   Filed Weights   12/09/19 0821  Weight: 78.9 kg    Examination: General:  Elderly woman, off BiPAP, on nasal cannula, no distress HENT: Mild pallor, no icterus, no JVD Lungs: Decreased breath sounds bilateral, shallow breaths, no rhonchi, no accessory muscle use, able to speak in full sentences Cardiovascular: S1-S2 regular, sinus on monitor Abdomen: Soft nontender Extremities: No deformity, no edema Neuro: Alert, interactive, nonfocal   Resolved Hospital Problem list     Assessment & Plan:  Acute hypercapnic respiratory failure post robotic case requiring CO2 insufflation -Patient seen with decreased responsiveness in PACU prompting ABG that revealed elevated CO2 of 50 P: Appears to have improved, can use BiPAP as needed only   Hypertension with history of ventricular tachycardia -Post procedure blood pressure borderline elevated -Home medications include Toprol-XL and Lasix P: Pain control As needed IV hydralazine Provide IV beta blocker therapy while NPO  Once able to tolerate oral diet resume home medications Close monitoring of vital signs in the ICU setting  Stage I endometrioid endometrial cancer status post hysterectomy P: Management per gynecology   Rest of management per primary team  Best practice:  Diet: NPO while on BIPAP Pain/Anxiety/Delirium protocol (if indicated): PRNs VAP protocol (if indicated): N/A DVT prophylaxis: PPI GI prophylaxis: PPI Glucose control: Monitor  Mobility: Bedrest  Code Status: Full  Family Communication: Per primary  Disposition: Stepdown   Labs   CBC: Recent Labs  Lab 12/05/19 1318  WBC 5.8  HGB 12.7  HCT 37.1  MCV 114.2*  PLT [id number]    Basic Metabolic Panel: Recent Labs  Lab 12/05/19 1318  NA 140  K 4.6  CL 104  CO2 25  GLUCOSE 128*  BUN 13  CREATININE  0.80  CALCIUM 9.6   GFR: Estimated Creatinine Clearance: 63.7 mL/min (by C-G formula based on SCr of 0.8 mg/dL). Recent Labs  Lab 12/05/19 1318  WBC 5.8    Liver Function Tests: Recent Labs  Lab 12/05/19 1318  AST 21   ALT 14  ALKPHOS 95  BILITOT 0.9  PROT 7.3  ALBUMIN 4.4   No results for input(s): LIPASE, AMYLASE in the last 168 hours. No results for input(s): AMMONIA in the last 168 hours.  ABG    Component Value Date/Time   PHART 7.372 12/09/2019 1625   PCO2ART 44.9 12/09/2019 1625   PO2ART 96.0 12/09/2019 1625   HCO3 25.5 12/09/2019 1625   ACIDBASEDEF 0.9 12/09/2019 1503   O2SAT 96.8 12/09/2019 1625     Coagulation Profile: No results for input(s): INR, PROTIME in the last 168 hours.  Cardiac Enzymes: No results for input(s): CKTOTAL, CKMB, CKMBINDEX, TROPONINI in the last 168 hours.  HbA1C: HB A1C (BAYER DCA - WAIVED)  Date/Time Value Ref Range Status  07/18/2019 10:26 AM 6.2 <7.0 % Final    Comment:                                          Diabetic Adult            <7.0                                       Healthy Adult        4.3 - 5.7                                                           (DCCT/NGSP) American Diabetes Association's Summary of Glycemic Recommendations for Adults with Diabetes: Hemoglobin A1c <7.0%. More stringent glycemic goals (A1c <6.0%) may further reduce complications at the cost of increased risk of hypoglycemia.    Hgb A1c MFr Bld  Date/Time Value Ref Range Status  11/18/2019 01:21 PM 6.5 (H) 4.8 - 5.6 % Final    Comment:    (NOTE) Pre diabetes:          5.7%-6.4% Diabetes:              >6.4% Glycemic control for   <7.0% adults with diabetes   12/08/2017 02:02 AM 6.1 (H) 4.8 - 5.6 % Final    Comment:    (NOTE) Pre diabetes:          5.7%-6.4% Diabetes:              >6.4% Glycemic control for   <7.0% adults with diabetes     CBG: Recent Labs  Lab 12/05/19 1315 12/09/19 0817  GLUCAP 135* 130*    Review of Systems:   Unable to obtain secondary to decreased mentation   Past Medical History  She,  has a past medical history of Anxiety, Atrial septal defect, Chest pain, Dizziness, Endometrial cancer, grade I ([place]) (10/09/2019),  Essential thrombocythemia ([place]) (06/06/2016), GERD (gastroesophageal reflux disease), History of kidney stones, Hyperlipidemia, Hypertension, LBBB (left bundle branch block) (2011), Palpitations, PONV (postoperative nausea and vomiting), Syncope, Type 2 diabetes mellitus with other  specified complication ([place]) (123456), and Ventricular tachycardia ([place]).   Surgical History    Past Surgical History:  Procedure Laterality Date  . APPENDECTOMY    . ASD [place] EXCISIONAL BIOPSY     Right and left  . BREAST SURGERY     right and left breast-benign  . CARDIAC CATHETERIZATION  12/24/2009   [name]   . CATARACT EXTRACTION W/PHACO Left 03/11/2013   Procedure: CATARACT EXTRACTION PHACO AND INTRAOCULAR LENS PLACEMENT (IOC);  Surgeon: [name], MD;  Location: [facility];  Service: Ophthalmology;  Laterality: Left;  CDE:  12.20  . HYSTEROSCOPY WITH D & C N/A 11/11/2019   Procedure: DILATATION AND CURETTAGE /HYSTEROSCOPY;  Surgeon: [name], MD;  Location: [facility];  Service: Gynecology;  Laterality: N/A;  . POLYPECTOMY N/A 11/11/2019   Procedure: POLYPECTOMY(ENDOMETRIAL POLYP);  Surgeon: [name], MD;  Location: [facility];  Service: Gynecology;  Laterality: N/A;  . TONSILLECTOMY    . TUBAL LIGATION     Bilateral     Social History   reports that she has never smoked. She has never used smokeless tobacco. She reports that she does not drink alcohol or use drugs.   Family History   Her family history includes Cancer in her mother and sister; Heart failure in her father; Stroke in her sister. There is no history of Coronary artery disease.   Allergies Allergies  Allergen Reactions  . Codeine Nausea And Vomiting  . Morphine Other (See Comments)    GI symptoms     Home Medications  Prior to Admission medications   Medication Sig Start Date End Date Taking? Authorizing Provider  aspirin EC 81 MG tablet Take 81 mg by mouth daily.   Yes  [provider]  atorvastatin (LIPITOR) 20 MG tablet Take 1 tablet (20 mg total) by mouth at bedtime. 07/22/19  Yes Dettinger, Fransisca Kaufmann, MD  furosemide (LASIX) 20 MG tablet TAKE 1 TABLET (20 MG TOTAL) BY MOUTH DAILY. Patient taking differently: Take 20 mg by mouth daily.  06/30/19  Yes Branch, Alphonse Guild, MD  hydroxyurea (HYDREA) 500 MG capsule TAKE ONE CAPSULE TWICE A DAY . MAY TAKE WITH FOOD TO MINIMIZE GI SIDE EFFECTS Patient taking differently: Take 500 mg by mouth 2 (two) times daily.  11/21/19  Yes Lockamy, Randi L, NP-C  ibuprofen (ADVIL) 600 MG tablet Take 1 tablet (600 mg total) by mouth every 6 (six) hours as needed for moderate pain. For AFTER surgery, use sparingly 11/18/19  Yes Cross, Melissa D, NP  losartan (COZAAR) 50 MG tablet Take 1 tablet (50 mg total) by mouth daily. 07/18/19  Yes Dettinger, Fransisca Kaufmann, MD  metoprolol succinate (TOPROL-XL) 50 MG 24 hr tablet Take 1 tablet (50 mg total) by mouth 2 (two) times a day. Take with or immediately following a meal. 04/30/19  Yes Branch, Alphonse Guild, MD  naproxen sodium (ALEVE) 220 MG tablet Take 440 mg by mouth 2 (two) times daily as needed (pain/headache).   Yes [provider]  omeprazole (PRILOSEC) 20 MG capsule Take 1 capsule (20 mg total) by mouth daily. Patient taking differently: Take 20 mg by mouth every evening.  07/18/19  Yes Dettinger, Fransisca Kaufmann, MD  LORazepam (ATIVAN) 0.5 MG tablet Take 0.5 mg by mouth daily as needed for anxiety.    [provider]  senna-docusate (SENOKOT-S) 8.6-50 MG tablet Take 2 tablets by mouth at bedtime. For AFTER surgery, do not  take if having diarrhea 11/18/19   Joylene John D, NP  traMADol (ULTRAM) 50 MG tablet Take 1 tablet (50 mg total) by mouth every 6 (six) hours as needed for severe pain. For AFTER surgery only, do not take and drive [id number]   Cross, Melissa D, NP  YUVAFEM 10 MCG TABS vaginal tablet PLACE 1 TABLET (10 MCG TOTAL) VAGINALLY 2 (TWO) TIMES A WEEK. Patient not taking:  Reported on 11/28/2019 11/17/19   [name], MD     Kara Mead MD. FCCP. [facility]  If no response to pager , please call [phone]   12/09/2019

## 2019-12-09 NOTE — Anesthesia Postprocedure Evaluation (Signed)
Anesthesia Post Note  Patient: Heather Barber  Procedure(s) Performed: XI ROBOTIC ASSISTED LAPAROSCOPIC HYSTERECTOMY BILATERAL SALPINGOOOPHORECTOMY (Bilateral ) SENTINEL LYMPH  NODE BIOPSY (N/A )     Patient location during evaluation: PACU Anesthesia Type: General Level of consciousness: awake and alert Pain management: pain level controlled Vital Signs Assessment: post-procedure vital signs reviewed and stable Respiratory status: spontaneous breathing, nonlabored ventilation and respiratory function stable Cardiovascular status: blood pressure returned to baseline and stable Postop Assessment: no apparent nausea or vomiting Anesthetic complications: no    Last Vitals:  Vitals:   12/09/19 1400 12/09/19 1415  BP: (!) 161/64 (!) 160/77  Pulse: 70 67  Resp: 12 13  Temp:    SpO2: 96% 96%    Last Pain:  Vitals:   12/09/19 1415  TempSrc:   PainSc: Asleep                 Lidia Collum

## 2019-12-10 ENCOUNTER — Telehealth: Payer: Self-pay | Admitting: *Deleted

## 2019-12-10 DIAGNOSIS — J95821 Acute postprocedural respiratory failure: Secondary | ICD-10-CM | POA: Diagnosis not present

## 2019-12-10 DIAGNOSIS — C541 Malignant neoplasm of endometrium: Secondary | ICD-10-CM

## 2019-12-10 LAB — BASIC METABOLIC PANEL
Anion gap: 10 (ref 5–15)
BUN: 12 mg/dL (ref 8–23)
CO2: 25 mmol/L (ref 22–32)
Calcium: 9.1 mg/dL (ref 8.9–10.3)
Chloride: 105 mmol/L (ref 98–111)
Creatinine, Ser: 0.56 mg/dL (ref 0.44–1.00)
GFR calc Af Amer: >60 mL/min (ref >60)
GFR calc non Af Amer: >60 mL/min (ref >60)
Glucose, Bld: 147 mg/dL — ABNORMAL HIGH (ref 70–99)
Potassium: 5 mmol/L (ref 3.5–5.1)
Sodium: 140 mmol/L (ref 135–145)

## 2019-12-10 LAB — CBC
HCT: 38.5 % (ref 36.0–46.0)
Hemoglobin: 13 g/dL (ref 12.0–15.0)
MCH: 38.8 pg — ABNORMAL HIGH (ref 26.0–34.0)
MCHC: 33.8 g/dL (ref 30.0–36.0)
MCV: 114.9 fL — ABNORMAL HIGH (ref 80.0–100.0)
Platelets: 349 10*3/uL (ref 150–400)
RBC: 3.35 MIL/uL — ABNORMAL LOW (ref 3.87–5.11)
RDW: 11.8 % (ref 11.5–15.5)
WBC: 12.2 10*3/uL — ABNORMAL HIGH (ref 4.0–10.5)
nRBC: 0 % (ref 0.0–0.2)

## 2019-12-10 LAB — GLUCOSE, CAPILLARY: Glucose-Capillary: 116 mg/dL — ABNORMAL HIGH (ref 70–99)

## 2019-12-10 NOTE — Progress Notes (Signed)
NAME:  Heather Barber, MRN:  [id number], DOB:  [birth year], LOS: 0 ADMISSION DATE:  12/09/2019, CONSULTATION DATE: 2/9 REFERRING MD: Dr. Denman George, CHIEF COMPLAINT: Placed back on BiPAP due to hypercarbia  Brief History   74 year old female never smoker  with a history of grade 1 endometrioid endometrial cancer who presented originally for elective laparoscopic hysterectomy and bilateral salpingo-oophorectomy.  PCCM consulted for hypercapnic respiratory failure postanesthesia  History of present illness   Heather Barber is a 74 year old female with a past medical history significant for grade 1 endometrioid endometrial adenocarcinoma, type 2 diabetes, left bundle branch block, hypertension, atrial septal defect repaired 1994, hyperlipidemia, GERD, and anxiety who presented for elective hysterectomy 2/8.  Endometrial cancer was diagnosed November 11, 2019.  Operative report indicates no complications during procedure.  Patient seen minimally responsive to area for which ABG was obtained.  ABG at time as follows 7.33/50/142/25.8.  Patient was placed on BiPAP therapy at this time and PCCM was consulted.  ABG post BiPAP therapy with improved CO2 to 44.9  Past Medical History  Ventricular tachycardia Type 2 diabetes Left bundle branch block Hypertension Atrial septal defect repair 1994 Hyperlipidemia GERD Anxiety  Significant Hospital Events   Admitted 2/9 for elective hysterectomy  Consults:  PCCM 2/9    Interim history/subjective:  Feels great, no sob or am HA  On Room air   Objective   Blood pressure (!) 135/54, pulse 78, temperature 98.3 F (36.8 C), temperature source Oral, resp. rate 17, height 5\' 4"  (1.626 m), weight 78.9 kg, SpO2 95 %.        Intake/Output Summary (Last 24 hours) at 12/10/2019 [id number] Last data filed at 12/10/2019 0700 Gross per 24 hour  Intake 2925.02 ml  Output 1975 ml  Net 950.02 ml   Filed Weights   12/09/19 [id number]  Weight: 78.9 kg    Examination: Pt alert,  approp nad @ 60 degrees hob  No jvd Oropharynx clear,  mucosa nl Neck supple Lungs  Clear bilaterally RRR no s3 or or sign murmur Abd obese with nl excursion  Extr warm with no edema or clubbing noted Neuro  Sensorium intact ,  no apparent motor deficits    cxr 12/08/19 reviewed: wnl   Resolved Hospital Problem list     Assessment & Plan:  Acute hypercapnic respiratory failure post robotic case requiring CO2 insufflation -Patient seen with decreased responsiveness in PACU prompting ABG that revealed elevated CO2 of 50 with nl hc03 preop (so not chronic)  >>> resolved tol room air and ready for d/c   Hypertension with history of ventricular tachycardia -Post procedure blood pressure borderline elevated -Home medications include Toprol-XL and Lasix P: Adequate control on present rx > no change in rx needed    Stage I endometrioid endometrial cancer status post hysterectomy P: Management per gynecology    Does not appear to have ohs/ osa or resp dz > f/u pulmonary prn   Labs   CBC: Recent Labs  Lab 12/05/19 1318 12/10/19 0435  WBC 5.8 12.2*  HGB 12.7 13.0  HCT 37.1 38.5  MCV 114.2* 114.9*  PLT 313 0000000    Basic Metabolic Panel: Recent Labs  Lab 12/05/19 1318 12/10/19 0435  NA 140 140  K 4.6 5.0  CL 104 105  CO2 25 25  GLUCOSE 128* 147*  BUN 13 12  CREATININE 0.80 0.56  CALCIUM 9.6 9.1   GFR: Estimated Creatinine Clearance: 63.7 mL/min (by C-G formula based on SCr of 0.56 mg/dL). Recent Labs  Lab 12/05/19 1318 12/10/19 0435  WBC 5.8 12.2*    Liver Function Tests: Recent Labs  Lab 12/05/19 1318  AST 21  ALT 14  ALKPHOS 95  BILITOT 0.9  PROT 7.3  ALBUMIN 4.4   No results for input(s): LIPASE, AMYLASE in the last 168 hours. No results for input(s): AMMONIA in the last 168 hours.  ABG    Component Value Date/Time   PHART 7.372 12/09/2019 1625   PCO2ART 44.9 12/09/2019 1625   PO2ART 96.0 12/09/2019 1625   HCO3 25.5 12/09/2019 1625    ACIDBASEDEF 0.9 12/09/2019 1503   O2SAT 96.8 12/09/2019 1625     Coagulation Profile: No results for input(s): INR, PROTIME in the last 168 hours.  Cardiac Enzymes: No results for input(s): CKTOTAL, CKMB, CKMBINDEX, TROPONINI in the last 168 hours.  HbA1C: HB A1C (BAYER DCA - WAIVED)  Date/Time Value Ref Range Status  07/18/2019 10:26 AM 6.2 <7.0 % Final    Comment:                                          Diabetic Adult            <7.0                                       Healthy Adult        4.3 - 5.7                                                           (DCCT/NGSP) American Diabetes Association's Summary of Glycemic Recommendations for Adults with Diabetes: Hemoglobin A1c <7.0%. More stringent glycemic goals (A1c <6.0%) may further reduce complications at the cost of increased risk of hypoglycemia.    Hgb A1c MFr Bld  Date/Time Value Ref Range Status  11/18/2019 01:21 PM 6.5 (H) 4.8 - 5.6 % Final    Comment:    (NOTE) Pre diabetes:          5.7%-6.4% Diabetes:              >6.4% Glycemic control for   <7.0% adults with diabetes   12/08/2017 02:02 AM 6.1 (H) 4.8 - 5.6 % Final    Comment:    (NOTE) Pre diabetes:          5.7%-6.4% Diabetes:              >6.4% Glycemic control for   <7.0% adults with diabetes     CBG: Recent Labs  Lab 12/09/19 0817 12/09/19 1441 12/09/19 1814 12/09/19 2313 12/10/19 0628  GLUCAP 130* 182* 153* 213* 116*      Christinia Gully, MD [facility] [place] [id number] After 5:30 PM or weekends, use [facility] [id number]

## 2019-12-10 NOTE — Discharge Instructions (Addendum)
12/09/2019   Activity: 1. Be up and out of the bed during the day.  Take a nap if needed.  You may walk up steps but be careful and use the hand rail.  Stair climbing will tire you more than you think, you may need to stop part way and rest.   2. No lifting or straining for 6 weeks.  3. No driving for 1 weeks.  Do Not drive if you are taking narcotic pain medicine.  4. Shower daily.  Use soap and water on your incision and pat dry; don't rub.   5. No sexual activity and nothing in the vagina for 8 weeks.  Medications:  - Take ibuprofen and tylenol first line for pain control. Take these regularly (every 6 hours) to decrease the build up of pain.  - If necessary, for severe pain not relieved by ibuprofen, take tramadol.  - While taking tramadol you should take sennakot every night to reduce the likelihood of constipation. If this causes diarrhea, stop its use.  Diet: 1. Low sodium Heart Healthy Diet is recommended.  2. It is safe to use a laxative if you have difficulty moving your bowels.   Wound Care: 1. Keep clean and dry.  Shower daily. 2. You can get the dressing wet in the shower, but avoid tub baths. 3. Remove the dressing between 5 and 7 days after your surgery (1 week after your operation). 4. After the dressing is removed you can get the incision wet in the shower, however continue to avoid tub baths until advised otherwise by your surgeon at follow-up.   Reasons to call the Doctor:   Fever - Oral temperature greater than 100.4 degrees Fahrenheit  Foul-smelling vaginal discharge  Difficulty urinating  Nausea and vomiting  Increased pain at the site of the incision that is unrelieved with pain medicine.  Difficulty breathing with or without chest pain  New calf pain especially if only on one side  Sudden, continuing increased vaginal bleeding with or without clots.   Follow-up: 1. See [name] in 3 weeks.  Contacts: For questions or concerns you should  contact:  Dr. [name] at [phone] After hours and on week-ends call [phone] and ask to speak to the physician on call for Gynecologic Oncology   After Your Surgery  The information in this section will tell you what to expect after your surgery, both during your stay and after you leave. You will learn how to safely recover from your surgery. Write down any questions you have and be sure to ask your doctor or nurse.  What to Expect When you wake up after your surgery, you will be in the [facility] (PACU) or your recovery room. A nurse will be monitoring your body temperature, blood pressure, pulse, and oxygen levels. You may have a urinary catheter in your bladder to help monitor the amount of urine you are making. It should come out before you go home. You will also have compression boots on your lower legs to help your circulation. Your pain medication will be given through an IV line or in tablet form. If you are having pain, tell your nurse. Your nurse will tell you how to recover from your surgery. Below are examples of ways you can help yourself recover safely. . You will be encouraged to walk with the help of your nurse or physical therapist. We will give you medication to relieve pain. Walking helps reduce the risk for blood  clots and pneumonia. It also helps to stimulate your bowels so they begin working again. . Use your incentive spirometer. This will help your lungs expand, which prevents pneumonia.   Commonly Asked Questions  Will I have pain after surgery? Yes, you will have some pain after your surgery, especially in the first few days. Your doctor and nurse will ask you about your pain often. You will be given medication to manage your pain as needed. If your pain is not relieved, please tell your doctor or nurse. It is important to control your pain so you can cough, breathe deeply, use your incentive spirometer, and get out of bed and walk.  Will  I be able to eat? Yes, you will be able to eat a regular diet or eat as tolerated. You should start with foods that are soft and easy to digest such as apple sauce and chicken noodle soup. Eat small meals frequently, and then advance to regular foods. If you experience bloating, gas, or cramps, limit high-fiber foods, including whole grain breads and cereal, nuts, seeds, salads, fresh fruit, broccoli, cabbage, and cauliflower. Will I have pain when I am home? The length of time each person has pain or discomfort varies. You may still have some pain when you go home and will probably be taking pain medication. Follow the guidelines below. . Take your medications as directed and as needed. . Call your doctor if the medication prescribed for you doesn't relieve your pain. . Don't drive or drink alcohol while you're taking prescription pain medication. . As your incision heals, you will have less pain and need less pain medication. A mild pain reliever such as acetaminophen (Tylenol) or ibuprofen (Advil) will relieve aches and discomfort. However, large quantities of acetaminophen may be harmful to your liver. Don't take more acetaminophen than the amount directed on the bottle or as instructed by your doctor or nurse. . Pain medication should help you as you resume your normal activities. Take enough medication to do your exercises comfortably. Pain medication is most effective 30 to 45 minutes after taking it. Marland Kitchen Keep track of when you take your pain medication. Taking it when your pain first begins is more effective than waiting for the pain to get worse. Pain medication may cause constipation (having fewer bowel movements than what is normal for you).  How can I prevent constipation? . Go to the bathroom at the same time every day. Your body will get used to going at that time. . If you feel the urge to go, don't put it off. Try to use the bathroom 5 to 15 minutes after meals. . After breakfast is a  good time to move your bowels. The reflexes in your colon are strongest at this time. . Exercise, if you can. Walking is an excellent form of exercise. . Drink 8 (8-ounce) glasses (2 liters) of liquids daily, if you can. Drink water, juices, soups, ice cream shakes, and other drinks that don't have caffeine. Drinks with caffeine, such as coffee and soda, pull fluid out of the body. . Slowly increase the fiber in your diet to 25 to 35 grams per day. Fruits, vegetables, whole grains, and cereals contain fiber. If you have an ostomy or have had recent bowel surgery, check with your doctor or nurse before making any changes in your diet. . Both over-the-counter and prescription medications are available to treat constipation. Start with 1 of the following over-the-counter medications first: o Docusate sodium (Colace) 100 mg.  Take ___1__ capsules _2____ times a day. This is a stool softener that causes few side effects. Don't take it with mineral oil. o Polyethylene glycol (MiraLAX) 17 grams daily. o Senna (Senokot) 2 tablets at bedtime. This is a stimulant laxative, which can cause cramping. . If you haven't had a bowel movement in 2 days, call your doctor or nurse.  Can I shower? Yes, you should shower 24 hours after your surgery. Be sure to shower every day. Taking a warm shower is relaxing and can help decrease muscle aches. Use soap when you shower and gently wash your incision. Pat the areas dry with a towel after showering, and leave your incision uncovered (unless there is drainage). Call your doctor if you see any redness or drainage from your incision. Don't take tub baths until you discuss it with your doctor at the first appointment after your surgery. How do I care for my incisions? You will have several small incisions on your abdomen. The incisions are closed with Steri-Strips or Dermabond. You may also have square white dressings on your incisions (Primapore). You can remove these in  the shower 24 hours after your surgery. You should clean your incisions with soap and water. If you go home with Steri-Strips on your incision, they will loosen and may fall off by themselves. If they haven't fallen off within 10 days, you can remove them. If you go home with Dermabond over your sutures (stitches), it will also loosen and peel off.  What are the most common symptoms after a hysterectomy? It's common for you to have some vaginal spotting or light bleeding. You should monitor this with a pad or a panty liner. If you have having heavy bleeding (bleeding through a pad or liner every 1 to 2 hours), call your doctor right away. It's also common to have some discomfort after surgery from the air that was pumped into your abdomen during surgery. To help with this, walk, drink plenty of liquids and make sure to take the stool softeners you received.  When is it safe for me to drive? You may resume driving 2 weeks after surgery, as long as you are not taking pain medication that may make you drowsy.  When can I resume sexual activity? Do not place anything in your vagina or have vaginal intercourse for 8 weeks after your surgery. Some people will need to wait longer than 8 weeks, so speak with your doctor before resuming sexual intercourse.  Will I be able to travel? Yes, you can travel. If you are traveling by plane within a few weeks after your surgery, make sure you get up and walk every hour. Be sure to stretch your legs, drink plenty of liquids, and keep your feet elevated when possible.  Will I need any supplies? Most people do not need any supplies after the surgery. In the rare case that you do need supplies, such as tubes or drains, your nurse will order them for you.  When can I return to work? The time it takes to return to work depends on the type of work you do, the type of surgery you had, and how fast your body heals. Most people can return to work about 2 to 4 weeks after  the surgery.  What exercises can I do? Exercise will help you gain strength and feel better. Walking and stair climbing are excellent forms of exercise. Gradually increase the distance you walk. Climb stairs slowly, resting or stopping as needed. Ask  your doctor or nurse before starting more strenuous exercises.  When can I lift heavy objects? Most people should not lift anything heavier than 10 pounds (4.5 kilograms) for at least 4 weeks after surgery. Speak with your doctor about when you can do heavy lifting.  How can I cope with my feelings? After surgery for a serious illness, you may have new and upsetting feelings. Many people say they felt weepy, sad, worried, nervous, irritable, and angry at one time or another. You may find that you can't control some of these feelings. If this happens, it's a good idea to seek emotional support. The first step in coping is to talk about how you feel. Family and friends can help. Your nurse, doctor, and social worker can reassure, support, and guide you. It's always a good idea to let these professionals know how you, your family, and your friends are feeling emotionally. Many resources are available to patients and their families. Whether you're in the hospital or at home, the nurses, doctors, and social workers are here to help you and your family and friends handle the emotional aspects of your illness.  When is my first appointment after surgery? Your first appointment after surgery will be 2 to 4 weeks after surgery. Your nurse will give you instructions on how to make this appointment, including the phone number to call.  What if I have other questions? If you have any questions or concerns, please talk with your doctor or nurse. You can reach them Monday through Friday from 9:00 am to 5:00 pm. After 5:00 pm, during the weekend, and on holidays, call [phone] and ask for the doctor on call for your doctor.  . Have a temperature of 101 F (38.3  C) or higher . Have pain that does not get better with pain medication . Have redness, drainage, or swelling from your incisions

## 2019-12-10 NOTE — Telephone Encounter (Signed)
Per Lenna Sciara APP called  patient's daughter and gave the new patient room number

## 2019-12-10 NOTE — Progress Notes (Signed)
Patient transferred to room 1607 stable with all belongings.

## 2019-12-10 NOTE — Consult Note (Deleted)
NAME:  Heather Barber, MRN:  [id number], DOB:  February 17, 1946, LOS: 0 ADMISSION DATE:  12/09/2019, CONSULTATION DATE: 2/9 REFERRING MD: Dr. Denman George, CHIEF COMPLAINT: Placed back on BiPAP due to hypercarbia  Brief History   74 year old female never smoker  with a history of grade 1 endometrioid endometrial cancer who presented originally for elective laparoscopic hysterectomy and bilateral salpingo-oophorectomy.  PCCM consulted for hypercapnic respiratory failure postanesthesia  History of present illness   Heather Barber is a 74 year old female with a past medical history significant for grade 1 endometrioid endometrial adenocarcinoma, type 2 diabetes, left bundle branch block, hypertension, atrial septal defect repaired 1994, hyperlipidemia, GERD, and anxiety who presented for elective hysterectomy 2/8.  Endometrial cancer was diagnosed November 11, 2019.  Operative report indicates no complications during procedure.  Patient seen minimally responsive to area for which ABG was obtained.  ABG at time as follows 7.33/50/142/25.8.  Patient was placed on BiPAP therapy at this time and PCCM was consulted.  ABG post BiPAP therapy with improved CO2 to 44.9  Past Medical History  Ventricular tachycardia Type 2 diabetes Left bundle branch block Hypertension Atrial septal defect repair 1994 Hyperlipidemia GERD Anxiety  Significant Hospital Events   Admitted 2/9 for elective hysterectomy  Consults:  PCCM 2/9    Interim history/subjective:  Feels great, no sob or am HA  On Room air   Objective   Blood pressure (!) 135/54, pulse 78, temperature 98.3 F (36.8 C), temperature source Oral, resp. rate 17, height 5\' 4"  (1.626 m), weight 78.9 kg, SpO2 95 %.        Intake/Output Summary (Last 24 hours) at 12/10/2019 [id number] Last data filed at 12/10/2019 0700 Gross per 24 hour  Intake 2925.02 ml  Output 1975 ml  Net 950.02 ml   Filed Weights   12/09/19 [id number]  Weight: 78.9 kg    Examination: Pt alert,  approp nad @ 60 degrees hob  No jvd Oropharynx clear,  mucosa nl Neck supple Lungs  Clear bilaterally RRR no s3 or or sign murmur Abd obese with nl excursion  Extr warm with no edema or clubbing noted Neuro  Sensorium intact ,  no apparent motor deficits    cxr 12/08/19 reviewed: wnl   Resolved Hospital Problem list     Assessment & Plan:  Acute hypercapnic respiratory failure post robotic case requiring CO2 insufflation -Patient seen with decreased responsiveness in PACU prompting ABG that revealed elevated CO2 of 50 with nl hc03 preop (so not chronic)  >>> resolved tol room air and ready for d/c   Hypertension with history of ventricular tachycardia -Post procedure blood pressure borderline elevated -Home medications include Toprol-XL and Lasix P: Adequate control on present rx > no change in rx needed    Stage I endometrioid endometrial cancer status post hysterectomy P: Management per gynecology    Does not appear to have ohs/ osa or resp dz > f/u pulmonary prn   Labs   CBC: Recent Labs  Lab 12/05/19 1318 12/10/19 0435  WBC 5.8 12.2*  HGB 12.7 13.0  HCT 37.1 38.5  MCV 114.2* 114.9*  PLT 313 0000000    Basic Metabolic Panel: Recent Labs  Lab 12/05/19 1318 12/10/19 0435  NA 140 140  K 4.6 5.0  CL 104 105  CO2 25 25  GLUCOSE 128* 147*  BUN 13 12  CREATININE 0.80 0.56  CALCIUM 9.6 9.1   GFR: Estimated Creatinine Clearance: 63.7 mL/min (by C-G formula based on SCr of 0.56 mg/dL). Recent Labs  Lab 12/05/19 1318 12/10/19 0435  WBC 5.8 12.2*    Liver Function Tests: Recent Labs  Lab 12/05/19 1318  AST 21  ALT 14  ALKPHOS 95  BILITOT 0.9  PROT 7.3  ALBUMIN 4.4   No results for input(s): LIPASE, AMYLASE in the last 168 hours. No results for input(s): AMMONIA in the last 168 hours.  ABG    Component Value Date/Time   PHART 7.372 12/09/2019 1625   PCO2ART 44.9 12/09/2019 1625   PO2ART 96.0 12/09/2019 1625   HCO3 25.5 12/09/2019 1625    ACIDBASEDEF 0.9 12/09/2019 1503   O2SAT 96.8 12/09/2019 1625     Coagulation Profile: No results for input(s): INR, PROTIME in the last 168 hours.  Cardiac Enzymes: No results for input(s): CKTOTAL, CKMB, CKMBINDEX, TROPONINI in the last 168 hours.  HbA1C: HB A1C (BAYER DCA - WAIVED)  Date/Time Value Ref Range Status  07/18/2019 10:26 AM 6.2 <7.0 % Final    Comment:                                          Diabetic Adult            <7.0                                       Healthy Adult        4.3 - 5.7                                                           (DCCT/NGSP) American Diabetes Association's Summary of Glycemic Recommendations for Adults with Diabetes: Hemoglobin A1c <7.0%. More stringent glycemic goals (A1c <6.0%) may further reduce complications at the cost of increased risk of hypoglycemia.    Hgb A1c MFr Bld  Date/Time Value Ref Range Status  11/18/2019 01:21 PM 6.5 (H) 4.8 - 5.6 % Final    Comment:    (NOTE) Pre diabetes:          5.7%-6.4% Diabetes:              >6.4% Glycemic control for   <7.0% adults with diabetes   12/08/2017 02:02 AM 6.1 (H) 4.8 - 5.6 % Final    Comment:    (NOTE) Pre diabetes:          5.7%-6.4% Diabetes:              >6.4% Glycemic control for   <7.0% adults with diabetes     CBG: Recent Labs  Lab 12/09/19 0817 12/09/19 1441 12/09/19 1814 12/09/19 2313 12/10/19 0628  GLUCAP 130* 182* 153* 213* 116*      Christinia Gully, MD [facility] [place] [phone] After 5:30 PM or weekends, use [facility] [phone]

## 2019-12-10 NOTE — Progress Notes (Signed)
Reviewed discharge paperwork with patient. Went over medication regimen, follow up appointments, & care instructions for after surgery. Patient discharged via wheelchair by NT.

## 2019-12-10 NOTE — Discharge Summary (Signed)
Physician Discharge Summary  [name] MRN: [id number] DOB/AGE: [birth year] 74 y.o.  Admit date: 12/09/2019 Discharge date: 12/10/2019  Admission Diagnoses: Endometrial cancer, grade I [facility])  Discharge Diagnoses:  Principal Problem:   Endometrial cancer, grade I ([place]) Active Problems:   Endometrial cancer ([place])   Acute postoperative respiratory failure [facility])   Discharged Condition: good  Hospital Course:  1/ patient was admitted on 12/10/19 for a robotic hysterectomy, BSO, SLN biopsy for endometrial cancer 2/ surgery was uncomplicated however in the PACU she was not able to be discharged as she could not maintain oxygenation and required BIPAP. She was admitted overnight for BIPAP support and hypoxia resolved.  3/ on postoperative day 1 the patient was meeting discharge criteria: tolerating PO, voiding urine, ambulating, pain well controlled on oral medications. She had a pulse ox of 98% on room air with no shortness of breath at rest or on ambulation. 4/ new medications on discharge include oxycodone and ibuprofen and senakot (prescribed as an outpatient).  Consults: None  Significant Diagnostic Studies: none  Treatments: BIPAP  Discharge Exam: Blood pressure (!) 135/54, pulse 78, temperature 98.3 F (36.8 C), temperature source Oral, resp. rate 17, height 5\' 4"  (1.626 m), weight 174 lb (78.9 kg), SpO2 95 %. General appearance: alert and cooperative GI: soft, non-tender; bowel sounds normal; no masses,  no organomegaly Incision/Wound: clean and dry x 5  Disposition: Discharge disposition: 01-Home or Self Care       Discharge Instructions    (HEART FAILURE PATIENTS) Call MD:  Anytime you have any of the following symptoms: 1) 3 pound weight gain in 24 hours or 5 pounds in 1 week 2) shortness of breath, with or without a dry hacking cough 3) swelling in the hands, feet or stomach 4) if you have to sleep on extra pillows at night in order to breathe.   Complete  by: As directed    (HEART FAILURE PATIENTS) Call MD:  Anytime you have any of the following symptoms: 1) 3 pound weight gain in 24 hours or 5 pounds in 1 week 2) shortness of breath, with or without a dry hacking cough 3) swelling in the hands, feet or stomach 4) if you have to sleep on extra pillows at night in order to breathe.   Complete by: As directed    Call MD for:  difficulty breathing, headache or visual disturbances   Complete by: As directed    Call MD for:  difficulty breathing, headache or visual disturbances   Complete by: As directed    Call MD for:  difficulty breathing, headache or visual disturbances   Complete by: As directed    Call MD for:  extreme fatigue   Complete by: As directed    Call MD for:  extreme fatigue   Complete by: As directed    Call MD for:  extreme fatigue   Complete by: As directed    Call MD for:  hives   Complete by: As directed    Call MD for:  hives   Complete by: As directed    Call MD for:  persistant dizziness or light-headedness   Complete by: As directed    Call MD for:  persistant dizziness or light-headedness   Complete by: As directed    Call MD for:  persistant dizziness or light-headedness   Complete by: As directed    Call MD for:  persistant nausea and vomiting   Complete by: As directed  Call MD for:  persistant nausea and vomiting   Complete by: As directed    Call MD for:  persistant nausea and vomiting   Complete by: As directed    Call MD for:  redness, tenderness, or signs of infection (pain, swelling, redness, odor or green/yellow discharge around incision site)   Complete by: As directed    Call MD for:  redness, tenderness, or signs of infection (pain, swelling, redness, odor or green/yellow discharge around incision site)   Complete by: As directed    Call MD for:  redness, tenderness, or signs of infection (pain, swelling, redness, odor or green/yellow discharge around incision site)   Complete by: As directed     Call MD for:  severe uncontrolled pain   Complete by: As directed    Call MD for:  severe uncontrolled pain   Complete by: As directed    Call MD for:  severe uncontrolled pain   Complete by: As directed    Call MD for:  temperature >100.4   Complete by: As directed    Call MD for:  temperature >100.4   Complete by: As directed    Call MD for:  temperature >100.4   Complete by: As directed    Diet - low sodium heart healthy   Complete by: As directed    Diet - low sodium heart healthy   Complete by: As directed    Diet general   Complete by: As directed    Diet general   Complete by: As directed    Diet general   Complete by: As directed    Driving Restrictions   Complete by: As directed    No driving x 72 hrs   Driving Restrictions   Complete by: As directed    No driving for 7 days or until off narcotic pain medication   Driving Restrictions   Complete by: As directed    No driving for 7 days or until off narcotic pain medication   Increase activity slowly   Complete by: As directed    Gradually increase over 2 - 7 days   Increase activity slowly   Complete by: As directed    Increase activity slowly   Complete by: As directed    Lifting restrictions   Complete by: As directed    Avoid lifting > 20 lbs x 2 weeks.   May shower / Bathe   Complete by: As directed    May walk up steps   Complete by: As directed    No dressing needed   Complete by: As directed    Other Restrictions   Complete by: As directed    Exercise in 5 - 10 days.  Swim in 7 days.  Douching is not recommended.   Remove dressing in 24 hours   Complete by: As directed    Remove dressing in 24 hours   Complete by: As directed    Sexual Activity Restrictions   Complete by: As directed    No intercourse x 6 weeks   Sexual Activity Restrictions   Complete by: As directed    No intercourse for 6 weeks   Sexual Activity Restrictions   Complete by: As directed    No intercourse for 6 weeks      Allergies as of 12/10/2019      Reactions   Codeine Nausea And Vomiting   Morphine Other (See Comments)   GI symptoms      Medication List  STOP taking these medications   naproxen sodium 220 MG tablet Commonly known as: ALEVE   Yuvafem 10 MCG Tabs vaginal tablet Generic drug: Estradiol     TAKE these medications   aspirin EC 81 MG tablet Take 81 mg by mouth daily.   atorvastatin 20 MG tablet Commonly known as: LIPITOR Take 1 tablet (20 mg total) by mouth at bedtime.   furosemide 20 MG tablet Commonly known as: LASIX TAKE 1 TABLET (20 MG TOTAL) BY MOUTH DAILY. What changed:   how much to take  how to take this  when to take this  additional instructions   hydroxyurea 500 MG capsule Commonly known as: HYDREA TAKE ONE CAPSULE TWICE A DAY . MAY TAKE WITH FOOD TO MINIMIZE GI SIDE EFFECTS What changed: See the new instructions.   ibuprofen 600 MG tablet Commonly known as: ADVIL Take 1 tablet (600 mg total) by mouth every 6 (six) hours as needed for moderate pain. For AFTER surgery, use sparingly   LORazepam 0.5 MG tablet Commonly known as: ATIVAN Take 0.5 mg by mouth daily as needed for anxiety.   losartan 50 MG tablet Commonly known as: COZAAR Take 1 tablet (50 mg total) by mouth daily.   metoprolol succinate 50 MG 24 hr tablet Commonly known as: TOPROL-XL Take 1 tablet (50 mg total) by mouth 2 (two) times a day. Take with or immediately following a meal.   omeprazole 20 MG capsule Commonly known as: PRILOSEC Take 1 capsule (20 mg total) by mouth daily. What changed: when to take this   senna-docusate 8.6-50 MG tablet Commonly known as: Senokot-S Take 2 tablets by mouth at bedtime. For AFTER surgery, do not take if having diarrhea   traMADol 50 MG tablet Commonly known as: ULTRAM Take 1 tablet (50 mg total) by mouth every 6 (six) hours as needed for severe pain. For AFTER surgery only, do not take and drive      Follow-up Information     [name], MD In 3 weeks.   Specialty: Gynecologic Oncology Contact information: [place] [address] [phone]           Signed: Thereasa Solo 12/10/2019, 10:16 AM

## 2019-12-11 ENCOUNTER — Telehealth: Payer: Self-pay | Admitting: *Deleted

## 2019-12-11 NOTE — Telephone Encounter (Signed)
Pt states that she is a little sore, but overall feeling well this morning. Pt denies fever or chills. Pt reports that her pain has been well controled with Ibuprofen and Tylenol. Pt up ambulating, eating and drinking, urinating without difficulty. Pt is passing gas, but no BM yet. Pt has Senokot Rx, but has not taken it. Pt encourage to take Senokot and increase water intake to 64 oz per day. Pt states that her incisions are clean/dry/intact. Pt is aware of office number and she can call with any questions or concerns. Her upcoming appointment date/time were confirmed

## 2019-12-12 LAB — SURGICAL PATHOLOGY

## 2019-12-15 ENCOUNTER — Telehealth: Payer: Self-pay | Admitting: *Deleted

## 2019-12-15 NOTE — Telephone Encounter (Signed)
Patient called stating she had received a phone call from this office.  Patient informed of phone visit with Dr. Denman George tomorrow 12/16/19 at 3:30 p.m.  Patient verbalizes understanding.

## 2019-12-16 ENCOUNTER — Inpatient Hospital Stay: Payer: Medicare Other | Attending: Gynecologic Oncology | Admitting: Gynecologic Oncology

## 2019-12-16 DIAGNOSIS — Z90710 Acquired absence of both cervix and uterus: Secondary | ICD-10-CM

## 2019-12-16 DIAGNOSIS — Z7189 Other specified counseling: Secondary | ICD-10-CM

## 2019-12-16 DIAGNOSIS — Z90722 Acquired absence of ovaries, bilateral: Secondary | ICD-10-CM

## 2019-12-16 DIAGNOSIS — C541 Malignant neoplasm of endometrium: Secondary | ICD-10-CM

## 2019-12-16 NOTE — Progress Notes (Signed)
Gynecologic Oncology Telehealth Consult Note: Gyn-Onc  I connected with Heather Barber on 12/16/19 at  3:30 PM EST by telephone and verified that I am speaking with the correct person using two identifiers.  I discussed the limitations, risks, security and privacy concerns of performing an evaluation and management service by telemedicine and the availability of in-person appointments. I also discussed with the patient that there may be a patient responsible charge related to this service. The patient expressed understanding and agreed to proceed.  Other persons participating in the visit and their role in the encounter: none.  Patient's location: home Provider's location: [place]  Chief Complaint:  Chief Complaint  Patient presents with  . endometrial cancer    counseling and coordination of care    Assessment and Plan:  Heather Barber is a 74 year old woman with a history of stage IA grade 1 endometrioid endometrial adenocarcinoma, s/p robotic staging on 12/09/19. MMR normal/MMR stable  Pathology revealed low risk factors for recurrence, therefore no adjuvant therapy is recommended according to NCCN guidelines.  I discussed risk for recurrence and typical symptoms encouraged her to notify us of these should they develop between visits.  I recommend she have follow-up every 6 months for 5 years in accordance with NCCN guidelines. Those visits should include symptom assessment, physical exam and pelvic examination. Pap smears are not indicated or recommended in the routine surveillance of endometrial cancer.  HPI: 74 year old P4 who was seen in consultation at the request of Dr Glo Herring for evaluation of grade 1 endometrioid endometrial cancer.  The patient reported postmenopausal bleeding that began in February 2020.  This stopped a few months after began and she did not seek medical care for in 2020 until reemerged in December 2020.  She then saw Dr. Glo Herring who performed a  transvaginal ultrasound scan which showed a uterus measuring 5.3 x 3.7 x 4.3 cm with an endometrial stripe of 16 mm which was enlarged.  The ovaries were grossly normal.  Due to the endometrial thickening she went to the operating room on November 11, 2019 where a D&C was performed and endometrial polypectomy.  Final pathology from that procedure revealed FIGO grade 1 endometrioid endometrial adenocarcinoma.  The patient's medical history significant for an atrial septal defect repair in 1994.  She takes aspirin 81 mg daily.  She has ventricular tachycardia for which she takes Toprol-XL and losartan.  She has hypertension hyperlipidemia type 2 diabetes mellitus controlled with diet, and essential thrombocytopenia for which she takes hydroxyurea 500 mg daily.  Her last platelet count was greater than 200 in December 2020.  Patient surgical history is significant for an ASD repair and a tonsillectomy and tubal ligation.  She has had no other prior abdominal surgeries.  She has had 4 prior vaginal births.  She has nausea and vomiting with codeine and morphine but no true allergy.  Her family history significant for a sister with breast cancer in her 23s.  Interval Hx:  On 12/09/19 she underwent a robotic assisted total hysterectomy with BSO and SLN biopsy. Intraoperative findings were significant for bilateral mapping, normal appearing uterus. Surgery was uncomplicated.  Final pathology revealed a FIGO grade 1 adenocarcinoma of the endometrium (endometrioid), 7 of 23m myo invasion (inner half), no LVSI, no cervical/adnexal/nodal involvement. MSI stable/MMR normal. It met low risk features for recurrence and therefore no adjuvant therapy was recommended in accordance with NCCN guidelines.  Since surgery she has done well with no complaints.  Current Meds:  Outpatient Encounter Medications as of 12/16/2019  Medication Sig  . aspirin EC 81 MG tablet Take 81 mg by mouth daily.  Marland Kitchen atorvastatin (LIPITOR) 20  MG tablet Take 1 tablet (20 mg total) by mouth at bedtime.  . furosemide (LASIX) 20 MG tablet TAKE 1 TABLET (20 MG TOTAL) BY MOUTH DAILY. (Patient taking differently: Take 20 mg by mouth daily. )  . hydroxyurea (HYDREA) 500 MG capsule TAKE ONE CAPSULE TWICE A DAY . MAY TAKE WITH FOOD TO MINIMIZE GI SIDE EFFECTS (Patient taking differently: Take 500 mg by mouth 2 (two) times daily. )  . ibuprofen (ADVIL) 600 MG tablet Take 1 tablet (600 mg total) by mouth every 6 (six) hours as needed for moderate pain. For AFTER surgery, use sparingly  . LORazepam (ATIVAN) 0.5 MG tablet Take 0.5 mg by mouth daily as needed for anxiety.  Marland Kitchen losartan (COZAAR) 50 MG tablet Take 1 tablet (50 mg total) by mouth daily.  . metoprolol succinate (TOPROL-XL) 50 MG 24 hr tablet Take 1 tablet (50 mg total) by mouth 2 (two) times a day. Take with or immediately following a meal.  . omeprazole (PRILOSEC) 20 MG capsule Take 1 capsule (20 mg total) by mouth daily. (Patient taking differently: Take 20 mg by mouth every evening. )  . senna-docusate (SENOKOT-S) 8.6-50 MG tablet Take 2 tablets by mouth at bedtime. For AFTER surgery, do not take if having diarrhea  . traMADol (ULTRAM) 50 MG tablet Take 1 tablet (50 mg total) by mouth every 6 (six) hours as needed for severe pain. For AFTER surgery only, do not take and drive   No facility-administered encounter medications on file as of 12/16/2019.    Allergy:  Allergies  Allergen Reactions  . Codeine Nausea And Vomiting  . Morphine Other (See Comments)    GI symptoms    Social Hx:   Social History   Socioeconomic History  . Marital status: Widowed    Spouse name: widowed  . Number of children: 3  . Years of education: Not on file  . Highest education level: Not on file  Occupational History  . Occupation: Psychologist, prison and probation services  Tobacco Use  . Smoking status: Never Smoker  . Smokeless tobacco: Never Used  Substance and Sexual Activity  . Alcohol use: No  . Drug use: No   . Sexual activity: Not Currently    Birth control/protection: Post-menopausal  Other Topics Concern  . Not on file  Social History Narrative   Married and resides in [place] with husband and 3 children   Sedentary lifestyle   Social Determinants of Health   Financial Resource Strain:   . Difficulty of Paying Living Expenses: Not on file  Food Insecurity:   . Worried About Charity fundraiser in the Last Year: Not on file  . Ran Out of Food in the Last Year: Not on file  Transportation Needs:   . Lack of Transportation (Medical): Not on file  . Lack of Transportation (Non-Medical): Not on file  Physical Activity:   . Days of Exercise per Week: Not on file  . Minutes of Exercise per Session: Not on file  Stress:   . Feeling of Stress : Not on file  Social Connections:   . Frequency of Communication with Friends and Family: Not on file  . Frequency of Social Gatherings with Friends and Family: Not on file  . Attends Religious Services: Not on file  . Active Member of Clubs or  Organizations: Not on file  . Attends Archivist Meetings: Not on file  . Marital Status: Not on file  Intimate Partner Violence:   . Fear of Current or Ex-Partner: Not on file  . Emotionally Abused: Not on file  . Physically Abused: Not on file  . Sexually Abused: Not on file    Past Surgical Hx:  Past Surgical History:  Procedure Laterality Date  . APPENDECTOMY    . ASD [place] EXCISIONAL BIOPSY     Right and left  . BREAST SURGERY     right and left breast-benign  . CARDIAC CATHETERIZATION  12/24/2009   [name]   . CATARACT EXTRACTION W/PHACO Left 03/11/2013   Procedure: CATARACT EXTRACTION PHACO AND INTRAOCULAR LENS PLACEMENT (IOC);  Surgeon: [name], MD;  Location: [facility];  Service: Ophthalmology;  Laterality: Left;  CDE:  12.20  . HYSTEROSCOPY WITH D & C N/A 11/11/2019   Procedure: DILATATION AND CURETTAGE /HYSTEROSCOPY;  Surgeon:  [name], MD;  Location: [facility];  Service: Gynecology;  Laterality: N/A;  . POLYPECTOMY N/A 11/11/2019   Procedure: POLYPECTOMY(ENDOMETRIAL POLYP);  Surgeon: [name], MD;  Location: [facility];  Service: Gynecology;  Laterality: N/A;  . ROBOTIC ASSISTED LAPAROSCOPIC HYSTERECTOMY AND SALPINGECTOMY Bilateral 12/09/2019   Procedure: XI ROBOTIC ASSISTED LAPAROSCOPIC HYSTERECTOMY BILATERAL SALPINGOOOPHORECTOMY;  Surgeon: [name], MD;  Location: [facility];  Service: Gynecology;  Laterality: Bilateral;  . SENTINEL NODE BIOPSY N/A 12/09/2019   Procedure: SENTINEL LYMPH  NODE BIOPSY;  Surgeon: [name], MD;  Location: [facility];  Service: Gynecology;  Laterality: N/A;  . TONSILLECTOMY    . TUBAL LIGATION     Bilateral    Past Medical Hx:  Past Medical History:  Diagnosis Date  . Anxiety   . Atrial septal defect    Surgical repair in 1994  . Chest pain    Normal coronary angiography in 1994 and 2011  . Dizziness   . Endometrial cancer, grade I ([place]) 10/09/2019  . Essential thrombocythemia ([place]) 06/06/2016  . GERD (gastroesophageal reflux disease)   . History of kidney stones   . Hyperlipidemia   . Hypertension   . LBBB (left bundle branch block) 2011  . Palpitations   . PONV (postoperative nausea and vomiting)   . Syncope   . Type 2 diabetes mellitus with other specified complication ([place]) 4/40/1027  . Ventricular tachycardia ([name])    Exercise induced    Past Gynecological History:  SVD x 4 No LMP recorded. Patient is postmenopausal.  Family Hx:  Family History  Problem Relation Age of Onset  . Cancer Mother        female  . Stroke Sister   . Cancer Sister        breast  . Heart failure Father   . Coronary artery disease Neg Hx     Review of Systems:  Constitutional  Feels well,    ENT Normal appearing ears and nares bilaterally Skin/Breast  No rash, sores, jaundice, itching, dryness Cardiovascular  No chest pain, shortness of breath, or edema  Pulmonary  No  cough or wheeze.  Gastro Intestinal  No nausea, vomitting, or diarrhoea. No bright red blood per rectum, no abdominal pain, change in bowel movement, or constipation.  Genito Urinary  No frequency, urgency, dysuria, + postmenopausal bleeding Musculo Skeletal  No myalgia, arthralgia, joint swelling or pain  Neurologic  No weakness, numbness, change in gait,  Psychology  No depression, anxiety, insomnia.   Vitals:  There were no vitals taken for this visit.  Physical Exam: Deferred  I discussed the assessment and treatment plan with the patient. The patient was provided with an opportunity to ask questions and all were answered. The patient agreed with the plan and demonstrated an understanding of the instructions.   The patient was advised to call back or see an in-person evaluation if the symptoms worsen or if the condition fails to improve as anticipated.   I provided 7 minutes of non face-to-face telephone visit time during this encounter, and > 50% was spent counseling as documented under my assessment & plan.    Thereasa Solo, MD  12/16/2019, 3:49 PM

## 2019-12-22 DIAGNOSIS — Z029 Encounter for administrative examinations, unspecified: Secondary | ICD-10-CM

## 2019-12-30 ENCOUNTER — Encounter: Payer: Self-pay | Admitting: Gynecologic Oncology

## 2019-12-30 ENCOUNTER — Inpatient Hospital Stay: Payer: Medicare Other | Attending: Gynecologic Oncology | Admitting: Gynecologic Oncology

## 2019-12-30 ENCOUNTER — Other Ambulatory Visit: Payer: Self-pay

## 2019-12-30 VITALS — BP 115/51 | HR 87 | Temp 97.9°F | Resp 17 | Ht 64.0 in | Wt 172.2 lb

## 2019-12-30 DIAGNOSIS — Z79899 Other long term (current) drug therapy: Secondary | ICD-10-CM | POA: Insufficient documentation

## 2019-12-30 DIAGNOSIS — I10 Essential (primary) hypertension: Secondary | ICD-10-CM | POA: Insufficient documentation

## 2019-12-30 DIAGNOSIS — Q211 Atrial septal defect: Secondary | ICD-10-CM | POA: Insufficient documentation

## 2019-12-30 DIAGNOSIS — Z90710 Acquired absence of both cervix and uterus: Secondary | ICD-10-CM | POA: Diagnosis not present

## 2019-12-30 DIAGNOSIS — D693 Immune thrombocytopenic purpura: Secondary | ICD-10-CM | POA: Diagnosis not present

## 2019-12-30 DIAGNOSIS — C541 Malignant neoplasm of endometrium: Secondary | ICD-10-CM | POA: Diagnosis not present

## 2019-12-30 DIAGNOSIS — E785 Hyperlipidemia, unspecified: Secondary | ICD-10-CM | POA: Diagnosis not present

## 2019-12-30 DIAGNOSIS — I447 Left bundle-branch block, unspecified: Secondary | ICD-10-CM | POA: Diagnosis not present

## 2019-12-30 DIAGNOSIS — Z803 Family history of malignant neoplasm of breast: Secondary | ICD-10-CM | POA: Insufficient documentation

## 2019-12-30 DIAGNOSIS — Z7982 Long term (current) use of aspirin: Secondary | ICD-10-CM | POA: Insufficient documentation

## 2019-12-30 DIAGNOSIS — I472 Ventricular tachycardia: Secondary | ICD-10-CM | POA: Insufficient documentation

## 2019-12-30 DIAGNOSIS — E119 Type 2 diabetes mellitus without complications: Secondary | ICD-10-CM | POA: Diagnosis not present

## 2019-12-30 DIAGNOSIS — F419 Anxiety disorder, unspecified: Secondary | ICD-10-CM | POA: Insufficient documentation

## 2019-12-30 DIAGNOSIS — Z90722 Acquired absence of ovaries, bilateral: Secondary | ICD-10-CM | POA: Diagnosis not present

## 2019-12-30 DIAGNOSIS — K219 Gastro-esophageal reflux disease without esophagitis: Secondary | ICD-10-CM | POA: Diagnosis not present

## 2019-12-30 DIAGNOSIS — Z7189 Other specified counseling: Secondary | ICD-10-CM

## 2019-12-30 NOTE — Progress Notes (Signed)
Gynecologic Oncology Follow-up  Chief Complaint:  Chief Complaint  Patient presents with  . endometrial cancer    counseling and coordination of care    Assessment and Plan:  Ms Fiallo is a 74 year old woman with a history of stage IA grade 1 endometrioid endometrial adenocarcinoma, s/p robotic staging on 12/09/19. MMR normal/MMR stable  Pathology revealed low risk factors for recurrence, therefore no adjuvant therapy is recommended according to NCCN guidelines.  I discussed risk for recurrence and typical symptoms encouraged her to notify us of these should they develop between visits.  I recommend she have follow-up every 6 months for 5 years in accordance with NCCN guidelines. Those visits should include symptom assessment, physical exam and pelvic examination. Pap smears are not indicated or recommended in the routine surveillance of endometrial cancer.  Care plan given.   HPI: 74 year old P4 who was seen in consultation at the request of Dr Glo Herring for evaluation of grade 1 endometrioid endometrial cancer.  The patient reported postmenopausal bleeding that began in February 2020.  This stopped a few months after began and she did not seek medical care for in 2020 until reemerged in December 2020.  She then saw Dr. Glo Herring who performed a transvaginal ultrasound scan which showed a uterus measuring 5.3 x 3.7 x 4.3 cm with an endometrial stripe of 16 mm which was enlarged.  The ovaries were grossly normal.  Due to the endometrial thickening she went to the operating room on November 11, 2019 where a D&C was performed and endometrial polypectomy.  Final pathology from that procedure revealed FIGO grade 1 endometrioid endometrial adenocarcinoma.  The patient's medical history significant for an atrial septal defect repair in 1994.  She takes aspirin 81 mg daily.  She has ventricular tachycardia for which she takes Toprol-XL and losartan.  She has hypertension hyperlipidemia type 2 diabetes  mellitus controlled with diet, and essential thrombocytopenia for which she takes hydroxyurea 500 mg daily.  Her last platelet count was greater than 200 in December 2020.  Patient surgical history is significant for an ASD repair and a tonsillectomy and tubal ligation.  She has had no other prior abdominal surgeries.  She has had 4 prior vaginal births.  She has nausea and vomiting with codeine and morphine but no true allergy.  Her family history significant for a sister with breast cancer in her 58s.  Interval Hx:  On 12/09/19 she underwent a robotic assisted total hysterectomy with BSO and SLN biopsy. Intraoperative findings were significant for bilateral mapping, normal appearing uterus. Surgery was uncomplicated.  Final pathology revealed a FIGO grade 1 adenocarcinoma of the endometrium (endometrioid), 7 of 38m myo invasion (inner half), no LVSI, no cervical/adnexal/nodal involvement. MSI stable/MMR normal. It met low risk features for recurrence and therefore no adjuvant therapy was recommended in accordance with NCCN guidelines.  Since surgery she has done well with no complaints.  Current Meds:  Outpatient Encounter Medications as of 12/30/2019  Medication Sig  . aspirin EC 81 MG tablet Take 81 mg by mouth daily.  .Marland Kitchenatorvastatin (LIPITOR) 20 MG tablet Take 1 tablet (20 mg total) by mouth at bedtime.  . furosemide (LASIX) 20 MG tablet TAKE 1 TABLET (20 MG TOTAL) BY MOUTH DAILY. (Patient taking differently: Take 20 mg by mouth daily. )  . hydroxyurea (HYDREA) 500 MG capsule TAKE ONE CAPSULE TWICE A DAY . MAY TAKE WITH FOOD TO MINIMIZE GI SIDE EFFECTS (Patient taking differently: Take 500 mg by mouth 2 (two)  times daily. )  . ibuprofen (ADVIL) 600 MG tablet Take 1 tablet (600 mg total) by mouth every 6 (six) hours as needed for moderate pain. For AFTER surgery, use sparingly  . LORazepam (ATIVAN) 0.5 MG tablet Take 0.5 mg by mouth daily as needed for anxiety.  Marland Kitchen losartan (COZAAR) 50 MG  tablet Take 1 tablet (50 mg total) by mouth daily.  . metoprolol succinate (TOPROL-XL) 50 MG 24 hr tablet Take 1 tablet (50 mg total) by mouth 2 (two) times a day. Take with or immediately following a meal.  . omeprazole (PRILOSEC) 20 MG capsule Take 1 capsule (20 mg total) by mouth daily. (Patient taking differently: Take 20 mg by mouth every evening. )  . senna-docusate (SENOKOT-S) 8.6-50 MG tablet Take 2 tablets by mouth at bedtime. For AFTER surgery, do not take if having diarrhea  . traMADol (ULTRAM) 50 MG tablet Take 1 tablet (50 mg total) by mouth every 6 (six) hours as needed for severe pain. For AFTER surgery only, do not take and drive   No facility-administered encounter medications on file as of 12/30/2019.    Allergy:  Allergies  Allergen Reactions  . Codeine Nausea And Vomiting  . Morphine Other (See Comments)    GI symptoms    Social Hx:   Social History   Socioeconomic History  . Marital status: Widowed    Spouse name: widowed  . Number of children: 3  . Years of education: Not on file  . Highest education level: Not on file  Occupational History  . Occupation: Psychologist, prison and probation services  Tobacco Use  . Smoking status: Never Smoker  . Smokeless tobacco: Never Used  Substance and Sexual Activity  . Alcohol use: No  . Drug use: No  . Sexual activity: Not Currently    Birth control/protection: Post-menopausal  Other Topics Concern  . Not on file  Social History Narrative   Married and resides in [place] with husband and 3 children   Sedentary lifestyle   Social Determinants of Health   Financial Resource Strain:   . Difficulty of Paying Living Expenses: Not on file  Food Insecurity:   . Worried About Charity fundraiser in the Last Year: Not on file  . Ran Out of Food in the Last Year: Not on file  Transportation Needs:   . Lack of Transportation (Medical): Not on file  . Lack of Transportation (Non-Medical): Not on file  Physical Activity:   . Days of  Exercise per Week: Not on file  . Minutes of Exercise per Session: Not on file  Stress:   . Feeling of Stress : Not on file  Social Connections:   . Frequency of Communication with Friends and Family: Not on file  . Frequency of Social Gatherings with Friends and Family: Not on file  . Attends Religious Services: Not on file  . Active Member of Clubs or Organizations: Not on file  . Attends Archivist Meetings: Not on file  . Marital Status: Not on file  Intimate Partner Violence:   . Fear of Current or Ex-Partner: Not on file  . Emotionally Abused: Not on file  . Physically Abused: Not on file  . Sexually Abused: Not on file    Past Surgical Hx:  Past Surgical History:  Procedure Laterality Date  . APPENDECTOMY    . ASD [place] EXCISIONAL BIOPSY     Right and left  .  BREAST SURGERY     right and left breast-benign  . CARDIAC CATHETERIZATION  12/24/2009   [name]   . CATARACT EXTRACTION W/PHACO Left 03/11/2013   Procedure: CATARACT EXTRACTION PHACO AND INTRAOCULAR LENS PLACEMENT (IOC);  Surgeon: [name], MD;  Location: [facility];  Service: Ophthalmology;  Laterality: Left;  CDE:  12.20  . HYSTEROSCOPY WITH D & C N/A 11/11/2019   Procedure: DILATATION AND CURETTAGE /HYSTEROSCOPY;  Surgeon: [name], MD;  Location: [facility];  Service: Gynecology;  Laterality: N/A;  . POLYPECTOMY N/A 11/11/2019   Procedure: POLYPECTOMY(ENDOMETRIAL POLYP);  Surgeon: [name], MD;  Location: [facility];  Service: Gynecology;  Laterality: N/A;  . ROBOTIC ASSISTED LAPAROSCOPIC HYSTERECTOMY AND SALPINGECTOMY Bilateral 12/09/2019   Procedure: XI ROBOTIC ASSISTED LAPAROSCOPIC HYSTERECTOMY BILATERAL SALPINGOOOPHORECTOMY;  Surgeon: [name], MD;  Location: [facility];  Service: Gynecology;  Laterality: Bilateral;  . SENTINEL NODE BIOPSY N/A 12/09/2019   Procedure: SENTINEL LYMPH  NODE BIOPSY;  Surgeon: [name], MD;  Location: [facility];  Service:  Gynecology;  Laterality: N/A;  . TONSILLECTOMY    . TUBAL LIGATION     Bilateral    Past Medical Hx:  Past Medical History:  Diagnosis Date  . Anxiety   . Atrial septal defect    Surgical repair in 1994  . Chest pain    Normal coronary angiography in 1994 and 2011  . Dizziness   . Endometrial cancer, grade I ([place]) 10/09/2019  . Essential thrombocythemia ([place]) 06/06/2016  . GERD (gastroesophageal reflux disease)   . History of kidney stones   . Hyperlipidemia   . Hypertension   . LBBB (left bundle branch block) 2011  . Palpitations   . PONV (postoperative nausea and vomiting)   . Syncope   . Type 2 diabetes mellitus with other specified complication ([place]) 1/61/0960  . Ventricular tachycardia ([name])    Exercise induced    Past Gynecological History:  SVD x 4 No LMP recorded. Patient is postmenopausal.  Family Hx:  Family History  Problem Relation Age of Onset  . Cancer Mother        female  . Stroke Sister   . Cancer Sister        breast  . Heart failure Father   . Coronary artery disease Neg Hx     Review of Systems:  Constitutional  Feels well,    ENT Normal appearing ears and nares bilaterally Skin/Breast  No rash, sores, jaundice, itching, dryness Cardiovascular  No chest pain, shortness of breath, or edema  Pulmonary  No cough or wheeze.  Gastro Intestinal  No nausea, vomitting, or diarrhoea. No bright red blood per rectum, no abdominal pain, change in bowel movement, or constipation.  Genito Urinary  No frequency, urgency, dysuria, no bleeding Musculo Skeletal  No myalgia, arthralgia, joint swelling or pain  Neurologic  No weakness, numbness, change in gait,  Psychology  No depression, anxiety, insomnia.   Vitals:  Blood pressure (!) 115/51, pulse 87, temperature 97.9 F (36.6 C), temperature source Temporal, resp. rate 17, height 5' 4" (1.626 m), weight 172 lb 3.2 oz (78.1 kg), SpO2 97 %.  Physical Exam: WD in NAD Neck  Supple NROM, without  any enlargements.  Lymph Node Survey No cervical supraclavicular or inguinal adenopathy Cardiovascular  Pulse normal rate, regularity and rhythm. S1 and S2 normal.  Lungs  Clear to auscultation bilateraly, without wheezes/crackles/rhonchi. Good air movement.  Skin  No rash/lesions/breakdown  Psychiatry  Alert and oriented to person,  place, and time  Abdomen  Normoactive bowel sounds, abdomen soft, non-tender and obese without evidence of hernia. Well healed incisions. Back No CVA tenderness Genito Urinary  Vulva/vagina: Normal external female genitalia.  No lesions. No discharge or bleeding.  Bladder/urethra:  No lesions or masses, well supported bladder  Vagina: well healed cuff.  Rectal  deferred Extremities  No bilateral cyanosis, clubbing or edema.  Thereasa Solo, MD  12/30/2019, 2:55 PM

## 2019-12-30 NOTE — Patient Instructions (Signed)
Please notify Dr Denman George at phone number [phone] if you notice vaginal bleeding, new pelvic or abdominal pains, bloating, feeling full easy, or a change in bladder or bowel function.   Please contact Dr Serita Grit office (at [phone]) in April to request an appointment with her for September, 2021. You will see Dr Glo Herring for a check up in March, 2022.

## 2020-01-01 ENCOUNTER — Other Ambulatory Visit: Payer: Self-pay | Admitting: Cardiology

## 2020-01-16 ENCOUNTER — Encounter: Payer: Self-pay | Admitting: Family Medicine

## 2020-01-16 ENCOUNTER — Other Ambulatory Visit: Payer: Self-pay

## 2020-01-16 ENCOUNTER — Ambulatory Visit (INDEPENDENT_AMBULATORY_CARE_PROVIDER_SITE_OTHER): Payer: Medicare Other | Admitting: Family Medicine

## 2020-01-16 VITALS — BP 131/72 | HR 83 | Temp 98.4°F | Ht 64.0 in | Wt 175.4 lb

## 2020-01-16 DIAGNOSIS — I10 Essential (primary) hypertension: Secondary | ICD-10-CM | POA: Diagnosis not present

## 2020-01-16 DIAGNOSIS — E1169 Type 2 diabetes mellitus with other specified complication: Secondary | ICD-10-CM | POA: Diagnosis not present

## 2020-01-16 DIAGNOSIS — K219 Gastro-esophageal reflux disease without esophagitis: Secondary | ICD-10-CM

## 2020-01-16 DIAGNOSIS — E782 Mixed hyperlipidemia: Secondary | ICD-10-CM

## 2020-01-16 LAB — BAYER DCA HB A1C WAIVED: HB A1C (BAYER DCA - WAIVED): 6.9 % (ref <7.0)

## 2020-01-16 MED ORDER — OMEPRAZOLE 20 MG PO CPDR: 20.0000 mg | DELAYED_RELEASE_CAPSULE | Freq: Every day | ORAL | 3 refills | Status: DC

## 2020-01-16 MED ORDER — ATORVASTATIN CALCIUM 20 MG PO TABS: 20.0000 mg | ORAL_TABLET | Freq: Every day | ORAL | 3 refills | Status: DC

## 2020-01-16 NOTE — Progress Notes (Signed)
BP 131/72   Pulse 83   Temp 98.4 F (36.9 C)   Ht '5\' 4"'  (1.626 m)   Wt 175 lb 6 oz (79.5 kg)   SpO2 97%   BMI 30.10 kg/m    Subjective:   [name], female    DOB: 1945-11-26, 74 y.o.   MRN: [id number]  HPI: Heather Barber is a 74 y.o. female presenting on 01/16/2020 for Medical Management of Chronic Issues (52M) and Hypertension   HPI Hypertension Patient is currently on losartan and metoprolol, and their blood pressure today is 131/72. Patient denies any lightheadedness or dizziness. Patient denies headaches, blurred vision, chest pains, shortness of breath, or weakness. Denies any side effects from medication and is content with current medication.   Hyperlipidemia Patient is coming in for recheck of his hyperlipidemia. The patient is currently taking atorvastatin. They deny any issues with myalgias or history of liver damage from it. They deny any focal numbness or weakness or chest pain.   Type 2 diabetes mellitus Patient comes in today for recheck of his diabetes. Patient has been currently taking no medication currently has been diet controlled, she says her blood sugars been running up since her hysterectomy this year.. Patient is currently on an ACE inhibitor/ARB. Patient has not seen an ophthalmologist this year. Patient denies any issues with their feet.   GERD Patient is currently on omeprazole.  She denies any major symptoms or abdominal pain or belching or burping. She denies any blood in her stool or lightheadedness or dizziness.   Relevant past medical, surgical, family and social history reviewed and updated as indicated. Interim medical history since our last visit reviewed. Allergies and medications reviewed and updated.  Review of Systems  Constitutional: Negative for chills and fever.  Eyes: Negative for visual disturbance.  Respiratory: Negative for chest tightness and shortness of breath.   Cardiovascular: Negative for chest pain and leg  swelling.  Musculoskeletal: Negative for back pain and gait problem.  Skin: Negative for rash.  Neurological: Negative for light-headedness and headaches.  Psychiatric/Behavioral: Negative for agitation and behavioral problems.  All other systems reviewed and are negative.   Per HPI unless specifically indicated above   Allergies as of 01/16/2020      Reactions   Codeine Nausea And Vomiting   Morphine Other (See Comments)   GI symptoms      Medication List       Accurate as of January 16, 2020  3:14 PM. If you have any questions, ask your nurse or doctor.        STOP taking these medications   LORazepam 0.5 MG tablet Commonly known as: ATIVAN Stopped by: Fransisca Kaufmann Vergie Zahm, MD     TAKE these medications   aspirin EC 81 MG tablet Take 81 mg by mouth daily.   atorvastatin 20 MG tablet Commonly known as: LIPITOR Take 1 tablet (20 mg total) by mouth at bedtime.   furosemide 20 MG tablet Commonly known as: LASIX TAKE 1 TABLET BY MOUTH EVERY DAY   hydroxyurea 500 MG capsule Commonly known as: HYDREA TAKE ONE CAPSULE TWICE A DAY . MAY TAKE WITH FOOD TO MINIMIZE GI SIDE EFFECTS What changed: See the new instructions.   ibuprofen 600 MG tablet Commonly known as: ADVIL Take 1 tablet (600 mg total) by mouth every 6 (six) hours as needed for moderate pain. For AFTER surgery, use sparingly   losartan 50 MG tablet Commonly known as: COZAAR Take 1 tablet (  50 mg total) by mouth daily.   metoprolol succinate 50 MG 24 hr tablet Commonly known as: TOPROL-XL Take 1 tablet (50 mg total) by mouth 2 (two) times a day. Take with or immediately following a meal.   omeprazole 20 MG capsule Commonly known as: PRILOSEC Take 1 capsule (20 mg total) by mouth daily. What changed: when to take this   senna-docusate 8.6-50 MG tablet Commonly known as: Senokot-S Take 2 tablets by mouth at bedtime. For AFTER surgery, do not take if having diarrhea   traMADol 50 MG tablet Commonly  known as: ULTRAM Take 1 tablet (50 mg total) by mouth every 6 (six) hours as needed for severe pain. For AFTER surgery only, do not take and drive        Objective:   BP 131/72   Pulse 83   Temp 98.4 F (36.9 C)   Ht '5\' 4"'  (1.626 m)   Wt 175 lb 6 oz (79.5 kg)   SpO2 97%   BMI 30.10 kg/m   Wt Readings from Last 3 Encounters:  01/16/20 175 lb 6 oz (79.5 kg)  12/30/19 172 lb 3.2 oz (78.1 kg)  12/09/19 174 lb (78.9 kg)    Physical Exam Vitals and nursing note reviewed.  Constitutional:      General: She is not in acute distress.    Appearance: She is well-developed. She is not diaphoretic.  Eyes:     Conjunctiva/sclera: Conjunctivae normal.  Cardiovascular:     Rate and Rhythm: Normal rate and regular rhythm.     Heart sounds: Normal heart sounds. No murmur.  Pulmonary:     Effort: Pulmonary effort is normal. No respiratory distress.     Breath sounds: Normal breath sounds. No wheezing.  Musculoskeletal:        General: No tenderness. Normal range of motion.  Skin:    General: Skin is warm and dry.     Findings: No rash.  Neurological:     Mental Status: She is alert and oriented to person, place, and time.     Coordination: Coordination normal.  Psychiatric:        Behavior: Behavior normal.     Assessment & Plan:   Problem List Items Addressed This Visit      Cardiovascular and Mediastinum   Hypertension   Relevant Medications   atorvastatin (LIPITOR) 20 MG tablet   Other Relevant Orders   CMP14+EGFR     Digestive   Gastroesophageal reflux disease   Relevant Medications   omeprazole (PRILOSEC) 20 MG capsule   Other Relevant Orders   CBC with Differential/Platelet     Endocrine   Type 2 diabetes mellitus with other specified complication ([name]) - Primary   Relevant Medications   atorvastatin (LIPITOR) 20 MG tablet   Other Relevant Orders   Bayer DCA Hb A1c Waived   CBC with Differential/Platelet   CMP14+EGFR     Other   Hyperlipidemia    Relevant Medications   atorvastatin (LIPITOR) 20 MG tablet   Other Relevant Orders   Lipid panel      Continue current medication, no change, will check A1c, sounds like blood sugars are up and we may have to start Metformin.  Follow up plan: Return in about 3 months (around 04/17/2020), or if symptoms worsen or fail to improve, for htn hld dm2.  Counseling provided for all of the vaccine components No orders of the defined types were placed in this encounter.   Caryl Pina, MD [facility] 01/16/2020, 3:14 PM

## 2020-01-17 LAB — CMP14+EGFR
ALT: 9 IU/L (ref 0–32)
AST: 13 IU/L (ref 0–40)
Albumin/Globulin Ratio: 1.8 (ref 1.2–2.2)
Albumin: 4.4 g/dL (ref 3.7–4.7)
Alkaline Phosphatase: 126 IU/L — ABNORMAL HIGH (ref 39–117)
BUN/Creatinine Ratio: 24 (ref 12–28)
BUN: 16 mg/dL (ref 8–27)
Bilirubin Total: 0.3 mg/dL (ref 0.0–1.2)
CO2: 25 mmol/L (ref 20–29)
Calcium: 9.9 mg/dL (ref 8.7–10.3)
Chloride: 100 mmol/L (ref 96–106)
Creatinine, Ser: 0.67 mg/dL (ref 0.57–1.00)
GFR calc Af Amer: 101 mL/min/{1.73_m2} (ref >59)
GFR calc non Af Amer: 87 mL/min/{1.73_m2} (ref >59)
Globulin, Total: 2.4 g/dL (ref 1.5–4.5)
Glucose: 142 mg/dL — ABNORMAL HIGH (ref 65–99)
Potassium: 4.8 mmol/L (ref 3.5–5.2)
Sodium: 140 mmol/L (ref 134–144)
Total Protein: 6.8 g/dL (ref 6.0–8.5)

## 2020-01-17 LAB — LIPID PANEL
Chol/HDL Ratio: 5.1 ratio — ABNORMAL HIGH (ref 0.0–4.4)
Cholesterol, Total: 185 mg/dL (ref 100–199)
HDL: 36 mg/dL — ABNORMAL LOW (ref >39)
LDL Chol Calc (NIH): 55 mg/dL (ref 0–99)
Triglycerides: 642 mg/dL (ref 0–149)
VLDL Cholesterol Cal: 94 mg/dL — ABNORMAL HIGH (ref 5–40)

## 2020-01-17 LAB — CBC WITH DIFFERENTIAL/PLATELET
Basophils Absolute: 0.1 10*3/uL (ref 0.0–0.2)
Basos: 1 %
EOS (ABSOLUTE): 0.3 10*3/uL (ref 0.0–0.4)
Eos: 5 %
Hematocrit: 33.5 % — ABNORMAL LOW (ref 34.0–46.6)
Hemoglobin: 12.6 g/dL (ref 11.1–15.9)
Immature Grans (Abs): 0 10*3/uL (ref 0.0–0.1)
Immature Granulocytes: 0 %
Lymphocytes Absolute: 2.6 10*3/uL (ref 0.7–3.1)
Lymphs: 45 %
MCH: 39 pg — ABNORMAL HIGH (ref 26.6–33.0)
MCHC: 37.6 g/dL — ABNORMAL HIGH (ref 31.5–35.7)
MCV: 104 fL — ABNORMAL HIGH (ref 79–97)
Monocytes Absolute: 0.4 10*3/uL (ref 0.1–0.9)
Monocytes: 7 %
Neutrophils Absolute: 2.5 10*3/uL (ref 1.4–7.0)
Neutrophils: 42 %
Platelets: 341 10*3/uL (ref 150–450)
RBC: 3.23 x10E6/uL — ABNORMAL LOW (ref 3.77–5.28)
RDW: 13 % (ref 11.7–15.4)
WBC: 5.8 10*3/uL (ref 3.4–10.8)

## 2020-04-05 ENCOUNTER — Other Ambulatory Visit (HOSPITAL_COMMUNITY): Payer: Self-pay | Admitting: *Deleted

## 2020-04-05 DIAGNOSIS — D75839 Thrombocytosis, unspecified: Secondary | ICD-10-CM

## 2020-04-06 ENCOUNTER — Other Ambulatory Visit: Payer: Self-pay

## 2020-04-06 ENCOUNTER — Inpatient Hospital Stay (HOSPITAL_COMMUNITY): Payer: Medicare Other | Attending: Hematology

## 2020-04-06 DIAGNOSIS — E119 Type 2 diabetes mellitus without complications: Secondary | ICD-10-CM | POA: Diagnosis not present

## 2020-04-06 DIAGNOSIS — Z803 Family history of malignant neoplasm of breast: Secondary | ICD-10-CM | POA: Insufficient documentation

## 2020-04-06 DIAGNOSIS — Z8049 Family history of malignant neoplasm of other genital organs: Secondary | ICD-10-CM | POA: Insufficient documentation

## 2020-04-06 DIAGNOSIS — Z8249 Family history of ischemic heart disease and other diseases of the circulatory system: Secondary | ICD-10-CM | POA: Diagnosis not present

## 2020-04-06 DIAGNOSIS — Z79899 Other long term (current) drug therapy: Secondary | ICD-10-CM | POA: Diagnosis not present

## 2020-04-06 DIAGNOSIS — E785 Hyperlipidemia, unspecified: Secondary | ICD-10-CM | POA: Insufficient documentation

## 2020-04-06 DIAGNOSIS — Z7982 Long term (current) use of aspirin: Secondary | ICD-10-CM | POA: Diagnosis not present

## 2020-04-06 DIAGNOSIS — R7989 Other specified abnormal findings of blood chemistry: Secondary | ICD-10-CM | POA: Insufficient documentation

## 2020-04-06 DIAGNOSIS — I10 Essential (primary) hypertension: Secondary | ICD-10-CM | POA: Diagnosis not present

## 2020-04-06 DIAGNOSIS — D75839 Thrombocytosis, unspecified: Secondary | ICD-10-CM

## 2020-04-06 DIAGNOSIS — C541 Malignant neoplasm of endometrium: Secondary | ICD-10-CM | POA: Diagnosis present

## 2020-04-06 LAB — CBC WITH DIFFERENTIAL/PLATELET
Abs Immature Granulocytes: 0.02 10*3/uL (ref 0.00–0.07)
Basophils Absolute: 0.1 10*3/uL (ref 0.0–0.1)
Basophils Relative: 1 %
Eosinophils Absolute: 0.2 10*3/uL (ref 0.0–0.5)
Eosinophils Relative: 3 %
HCT: 33.9 % — ABNORMAL LOW (ref 36.0–46.0)
Hemoglobin: 12 g/dL (ref 12.0–15.0)
Immature Granulocytes: 0 %
Lymphocytes Relative: 42 %
Lymphs Abs: 2.3 10*3/uL (ref 0.7–4.0)
MCH: 39.9 pg — ABNORMAL HIGH (ref 26.0–34.0)
MCHC: 35.4 g/dL (ref 30.0–36.0)
MCV: 112.6 fL — ABNORMAL HIGH (ref 80.0–100.0)
Monocytes Absolute: 0.5 10*3/uL (ref 0.1–1.0)
Monocytes Relative: 8 %
Neutro Abs: 2.5 10*3/uL (ref 1.7–7.7)
Neutrophils Relative %: 46 %
Platelets: 292 10*3/uL (ref 150–400)
RBC: 3.01 MIL/uL — ABNORMAL LOW (ref 3.87–5.11)
RDW: 14.4 % (ref 11.5–15.5)
WBC: 5.5 10*3/uL (ref 4.0–10.5)
nRBC: 0.4 % — ABNORMAL HIGH (ref 0.0–0.2)

## 2020-04-06 LAB — COMPREHENSIVE METABOLIC PANEL
ALT: 16 U/L (ref 0–44)
AST: 15 U/L (ref 15–41)
Albumin: 4.6 g/dL (ref 3.5–5.0)
Alkaline Phosphatase: 98 U/L (ref 38–126)
Anion gap: 14 (ref 5–15)
BUN: 16 mg/dL (ref 8–23)
CO2: 26 mmol/L (ref 22–32)
Calcium: 9.3 mg/dL (ref 8.9–10.3)
Chloride: 100 mmol/L (ref 98–111)
Creatinine, Ser: 0.7 mg/dL (ref 0.44–1.00)
GFR calc Af Amer: >60 mL/min (ref >60)
GFR calc non Af Amer: >60 mL/min (ref >60)
Glucose, Bld: 113 mg/dL — ABNORMAL HIGH (ref 70–99)
Potassium: 4.3 mmol/L (ref 3.5–5.1)
Sodium: 140 mmol/L (ref 135–145)
Total Bilirubin: 0.5 mg/dL (ref 0.3–1.2)
Total Protein: 7.1 g/dL (ref 6.5–8.1)

## 2020-04-13 ENCOUNTER — Inpatient Hospital Stay (HOSPITAL_BASED_OUTPATIENT_CLINIC_OR_DEPARTMENT_OTHER): Payer: Medicare Other | Admitting: Hematology

## 2020-04-13 VITALS — BP 131/56 | HR 91 | Temp 97.1°F | Resp 17 | Wt 176.8 lb

## 2020-04-13 DIAGNOSIS — C541 Malignant neoplasm of endometrium: Secondary | ICD-10-CM | POA: Diagnosis not present

## 2020-04-13 DIAGNOSIS — D75839 Thrombocytosis, unspecified: Secondary | ICD-10-CM

## 2020-04-13 DIAGNOSIS — D473 Essential (hemorrhagic) thrombocythemia: Secondary | ICD-10-CM

## 2020-04-13 NOTE — Progress Notes (Signed)
[address] [place], [place] [address]   CLINIC:  Medical Oncology/Hematology  PCP:  Dettinger, Fransisca Kaufmann, MD [place] / [place] [address]  [phone]  REASON FOR VISIT:  Thrombocytosis and newly diagnosed endometrial cancer.  CURRENT THERAPY: Hydrea 500 mg BID  INTERVAL HISTORY:  [name], a 74 y.o. female, returns for routine follow-up for her thrombocytosis. Fanta was last seen on 09/30/2018.  Today she reports feeling well. She had a hysterectomy in 12/2019 for cancer. She denies having any issues. She takes Hydrea twice daily and a ASA 81 every day. She denies any itching after a hot shower, and denies any itching or redness of her skin. She reports occasional nausea which is tolerable.   REVIEW OF SYSTEMS:  Review of Systems  Constitutional: Negative for appetite change and fatigue.  Skin: Negative for itching, rash and wound.  All other systems reviewed and are negative.   PAST MEDICAL/SURGICAL HISTORY:  Past Medical History:  Diagnosis Date  . Anxiety   . Atrial septal defect    Surgical repair in 1994  . Chest pain    Normal coronary angiography in 1994 and 2011  . Dizziness   . Endometrial cancer, grade I ([place]) 10/09/2019  . Essential thrombocythemia ([place]) 06/06/2016  . GERD (gastroesophageal reflux disease)   . History of kidney stones   . Hyperlipidemia   . Hypertension   . LBBB (left bundle branch block) 2011  . Palpitations   . PONV (postoperative nausea and vomiting)   . Syncope   . Type 2 diabetes mellitus with other specified complication ([place]) 8/55/0158  . Ventricular tachycardia ([place])    Exercise induced   Past Surgical History:  Procedure Laterality Date  . APPENDECTOMY    . ASD [place] EXCISIONAL BIOPSY     Right and left  . BREAST SURGERY     right and left breast-benign  . CARDIAC CATHETERIZATION  12/24/2009   [name]   . CATARACT EXTRACTION  W/PHACO Left 03/11/2013   Procedure: CATARACT EXTRACTION PHACO AND INTRAOCULAR LENS PLACEMENT (IOC);  Surgeon: [name], MD;  Location: [facility];  Service: Ophthalmology;  Laterality: Left;  CDE:  12.20  . HYSTEROSCOPY WITH D & C N/A 11/11/2019   Procedure: DILATATION AND CURETTAGE /HYSTEROSCOPY;  Surgeon: [name], MD;  Location: [facility];  Service: Gynecology;  Laterality: N/A;  . POLYPECTOMY N/A 11/11/2019   Procedure: POLYPECTOMY(ENDOMETRIAL POLYP);  Surgeon: [name], MD;  Location: [facility];  Service: Gynecology;  Laterality: N/A;  . ROBOTIC ASSISTED LAPAROSCOPIC HYSTERECTOMY AND SALPINGECTOMY Bilateral 12/09/2019   Procedure: XI ROBOTIC ASSISTED LAPAROSCOPIC HYSTERECTOMY BILATERAL SALPINGOOOPHORECTOMY;  Surgeon: [name], MD;  Location: [facility];  Service: Gynecology;  Laterality: Bilateral;  . SENTINEL NODE BIOPSY N/A 12/09/2019   Procedure: SENTINEL LYMPH  NODE BIOPSY;  Surgeon: [name], MD;  Location: [facility];  Service: Gynecology;  Laterality: N/A;  . TONSILLECTOMY    . TUBAL LIGATION     Bilateral    SOCIAL HISTORY:  Social History   Socioeconomic History  . Marital status: Widowed    Spouse name: widowed  . Number of children: 3  . Years of education: Not on file  . Highest education level: Not on file  Occupational History  . Occupation: Psychologist, prison and probation services  Tobacco Use  . Smoking status: Never Smoker  . Smokeless tobacco: Never Used  Vaping  Use  . Vaping Use: Never used  Substance and Sexual Activity  . Alcohol use: No  . Drug use: No  . Sexual activity: Not Currently    Birth control/protection: Post-menopausal  Other Topics Concern  . Not on file  Social History Narrative   Married and resides in [place] with husband and 3 children   Sedentary lifestyle   Social Determinants of Health   Financial Resource Strain:   . Difficulty of Paying Living Expenses:   Food Insecurity:   . Worried About Charity fundraiser in the Last Year:   . Arts development officer in the Last Year:   Transportation Needs:   . Film/video editor (Medical):   Marland Kitchen Lack of Transportation (Non-Medical):   Physical Activity:   . Days of Exercise per Week:   . Minutes of Exercise per Session:   Stress:   . Feeling of Stress :   Social Connections:   . Frequency of Communication with Friends and Family:   . Frequency of Social Gatherings with Friends and Family:   . Attends Religious Services:   . Active Member of Clubs or Organizations:   . Attends Archivist Meetings:   Marland Kitchen Marital Status:   Intimate Partner Violence:   . Fear of Current or Ex-Partner:   . Emotionally Abused:   Marland Kitchen Physically Abused:   . Sexually Abused:     FAMILY HISTORY:  Family History  Problem Relation Age of Onset  . Cancer Mother        female  . Stroke Sister   . Cancer Sister        breast  . Heart failure Father   . Coronary artery disease Neg Hx     CURRENT MEDICATIONS:  Current Outpatient Medications  Medication Sig Dispense Refill  . aspirin EC 81 MG tablet Take 81 mg by mouth daily.    Marland Kitchen atorvastatin (LIPITOR) 20 MG tablet Take 1 tablet (20 mg total) by mouth at bedtime. 90 tablet 3  . furosemide (LASIX) 20 MG tablet TAKE 1 TABLET BY MOUTH EVERY DAY 90 tablet 1  . hydroxyurea (HYDREA) 500 MG capsule TAKE ONE CAPSULE TWICE A DAY . MAY TAKE WITH FOOD TO MINIMIZE GI SIDE EFFECTS (Patient taking differently: Take 500 mg by mouth 2 (two) times daily. ) 180 capsule 1  . ibuprofen (ADVIL) 600 MG tablet Take 1 tablet (600 mg total) by mouth every 6 (six) hours as needed for moderate pain. For AFTER surgery, use sparingly 30 tablet 0  . losartan (COZAAR) 50 MG tablet Take 1 tablet (50 mg total) by mouth daily. 90 tablet 3  . metoprolol succinate (TOPROL-XL) 50 MG 24 hr tablet Take 1 tablet (50 mg total) by mouth 2 (two) times a day. Take with or immediately following a meal. 180 tablet 3  . omeprazole (PRILOSEC) 20 MG capsule Take 1 capsule (20 mg total) by mouth  daily. 90 capsule 3  . senna-docusate (SENOKOT-S) 8.6-50 MG tablet Take 2 tablets by mouth at bedtime. For AFTER surgery, do not take if having diarrhea 30 tablet 1  . traMADol (ULTRAM) 50 MG tablet Take 1 tablet (50 mg total) by mouth every 6 (six) hours as needed for severe pain. For AFTER surgery only, do not take and drive 10 tablet 0   No current facility-administered medications for this visit.    ALLERGIES:  Allergies  Allergen Reactions  . Codeine Nausea And Vomiting  . Morphine Other (See Comments)  GI symptoms    PHYSICAL EXAM:  Performance status (ECOG): 1 - Symptomatic but completely ambulatory  There were no vitals filed for this visit. Wt Readings from Last 3 Encounters:  01/16/20 175 lb 6 oz (79.5 kg)  12/30/19 172 lb 3.2 oz (78.1 kg)  12/09/19 174 lb (78.9 kg)   Physical Exam Vitals reviewed.  Constitutional:      Appearance: Normal appearance.  Cardiovascular:     Rate and Rhythm: Normal rate and regular rhythm.     Pulses: Normal pulses.     Heart sounds: Normal heart sounds.  Pulmonary:     Effort: Pulmonary effort is normal.     Breath sounds: Normal breath sounds.  Abdominal:     Palpations: Abdomen is soft. There is no hepatomegaly, splenomegaly or mass.     Tenderness: There is no abdominal tenderness.     Hernia: No hernia is present.  Musculoskeletal:     Right lower leg: No edema.     Left lower leg: No edema.  Lymphadenopathy:     Upper Body:     Right upper body: No supraclavicular or axillary adenopathy.     Left upper body: No supraclavicular or axillary adenopathy.  Neurological:     General: No focal deficit present.     Mental Status: She is alert and oriented to person, place, and time.  Psychiatric:        Mood and Affect: Mood normal.        Behavior: Behavior normal.     LABORATORY DATA:  I have reviewed the labs as listed.  CBC Latest Ref Rng & Units 04/06/2020 01/16/2020 12/10/2019  WBC 4.0 - 10.5 K/uL 5.5 5.8 12.2(H)    Hemoglobin 12.0 - 15.0 g/dL 12.0 12.6 13.0  Hematocrit 36 - 46 % 33.9(L) 33.5(L) 38.5  Platelets 150 - 400 K/uL 292 341 349   CMP Latest Ref Rng & Units 04/06/2020 01/16/2020 12/10/2019  Glucose 70 - 99 mg/dL 113(H) 142(H) 147(H)  BUN 8 - 23 mg/dL _0 Creatinine 0.44 - 1.00 mg/dL 0.70 0.67 0.56  Sodium 135 - 145 mmol/L 140 140 140  Potassium 3.5 - 5.1 mmol/L 4.3 4.8 5.0  Chloride 98 - 111 mmol/L 100 100 105  CO2 22 - 32 mmol/L _1 Calcium 8.9 - 10.3 mg/dL 9.3 9.9 9.1  Total Protein 6.5 - 8.1 g/dL 7.1 6.8 -  Total Bilirubin 0.3 - 1.2 mg/dL 0.5 0.3 -  Alkaline Phos 38 - 126 U/L 98 126(H) -  AST 15 - 41 U/L 15 13 -  ALT 0 - 44 U/L 16 9 -      Component Value Date/Time   RBC 3.01 (L) 04/06/2020 1257   MCV 112.6 (H) 04/06/2020 1257   MCV 104 (H) 01/16/2020 1536   MCH 39.9 (H) 04/06/2020 1257   MCHC 35.4 04/06/2020 1257   RDW 14.4 04/06/2020 1257   RDW 13.0 01/16/2020 1536   LYMPHSABS 2.3 04/06/2020 1257   LYMPHSABS 2.6 01/16/2020 1536   MONOABS 0.5 04/06/2020 1257   EOSABS 0.2 04/06/2020 1257   EOSABS 0.3 01/16/2020 1536   BASOSABS 0.1 04/06/2020 1257   BASOSABS 0.1 01/16/2020 1536    DIAGNOSTIC IMAGING:  I have independently reviewed the scans and discussed with the patient. No results found.   ASSESSMENT:  1.  [name]: -No prior history of thrombosis.  No vasomotor symptoms. -High risk because of her advanced age. -Hydroxyurea 500 mg twice daily started.  2.  Stage Ia grade 1 endometrioid endometrial adenocarcinoma: -Status post robotic staging on 12/09/2019. -MMR normal.   PLAN:  1.  [name]: -We have reviewed CBC.  Platelet count is 292.  Hematocrit is 33.9. -He does not report any aquagenic pruritus or vasomotor symptoms. -She will continue same dose of hydroxyurea. -She will come back in 6 months for follow-up.  2.  Stage Ia grade 1 endometrioid adenocarcinoma: -Due to low risk of recurrence, no  adjuvant chemotherapy was recommended.  Continue follow-up visits every 6 months for 5 years. -Continue to follow-up with Dr. Denman George.  Orders placed this encounter:  No orders of the defined types were placed in this encounter.    Derek Jack, MD [place] [phone]   I, Milinda Antis, am acting as a scribe for Dr. Sanda Linger.  I, Derek Jack MD, have reviewed the above documentation for accuracy and completeness, and I agree with the above.

## 2020-04-13 NOTE — Patient Instructions (Signed)
[place] at [facility] Discharge Instructions  You were seen today by Dr. Delton Coombes. He went over your recent results. You will be seen by the NP in 6 months for labs and follow up.   Thank you for choosing [place] at [facility] to provide your oncology and hematology care.  To afford each patient quality time with our provider, please arrive at least 15 minutes before your scheduled appointment time.   If you have a lab appointment with the [facility] please come in thru the Main Entrance and check in at the main information desk  You need to re-schedule your appointment should you arrive 10 or more minutes late.  We strive to give you quality time with our providers, and arriving late affects you and other patients whose appointments are after yours.  Also, if you no show three or more times for appointments you may be dismissed from the clinic at the providers discretion.     Again, thank you for choosing [facility].  Our hope is that these requests will decrease the amount of time that you wait before being seen by our physicians.       _____________________________________________________________  Should you have questions after your visit to [facility], please contact our office at [phone] between the hours of 8:00 a.m. and 4:30 p.m.  Voicemails left after 4:00 p.m. will not be returned until the following business day.  For prescription refill requests, have your pharmacy contact our office and allow 72 hours.    Cancer Center Support Programs:   > Cancer Support Group  2nd Tuesday of the month 1pm-2pm, Journey Room

## 2020-04-28 ENCOUNTER — Ambulatory Visit: Payer: Medicare Other | Admitting: Family Medicine

## 2020-05-07 ENCOUNTER — Other Ambulatory Visit: Payer: Self-pay | Admitting: Cardiology

## 2020-05-13 ENCOUNTER — Other Ambulatory Visit (HOSPITAL_COMMUNITY): Payer: Self-pay | Admitting: Nurse Practitioner

## 2020-05-13 DIAGNOSIS — D473 Essential (hemorrhagic) thrombocythemia: Secondary | ICD-10-CM

## 2020-05-25 ENCOUNTER — Encounter: Payer: Self-pay | Admitting: *Deleted

## 2020-06-16 ENCOUNTER — Ambulatory Visit (INDEPENDENT_AMBULATORY_CARE_PROVIDER_SITE_OTHER): Payer: Medicare Other | Admitting: Family Medicine

## 2020-06-16 ENCOUNTER — Encounter: Payer: Self-pay | Admitting: Family Medicine

## 2020-06-16 ENCOUNTER — Other Ambulatory Visit: Payer: Self-pay

## 2020-06-16 VITALS — BP 118/65 | HR 87 | Temp 97.8°F | Ht 64.0 in | Wt 173.0 lb

## 2020-06-16 DIAGNOSIS — E1169 Type 2 diabetes mellitus with other specified complication: Secondary | ICD-10-CM

## 2020-06-16 DIAGNOSIS — E782 Mixed hyperlipidemia: Secondary | ICD-10-CM | POA: Diagnosis not present

## 2020-06-16 DIAGNOSIS — Z1211 Encounter for screening for malignant neoplasm of colon: Secondary | ICD-10-CM

## 2020-06-16 DIAGNOSIS — I10 Essential (primary) hypertension: Secondary | ICD-10-CM | POA: Diagnosis not present

## 2020-06-16 DIAGNOSIS — Z780 Asymptomatic menopausal state: Secondary | ICD-10-CM | POA: Diagnosis not present

## 2020-06-16 DIAGNOSIS — D229 Melanocytic nevi, unspecified: Secondary | ICD-10-CM

## 2020-06-16 LAB — BAYER DCA HB A1C WAIVED: HB A1C (BAYER DCA - WAIVED): 7 % — ABNORMAL HIGH (ref <7.0)

## 2020-06-16 MED ORDER — KETOCONAZOLE 2 % EX CREA: 1.0000 "application " | TOPICAL_CREAM | Freq: Two times a day (BID) | CUTANEOUS | 0 refills | Status: DC

## 2020-06-16 NOTE — Progress Notes (Signed)
BP 118/65   Pulse 87   Temp 97.8 F (36.6 C)   Ht '5\' 4"'  (1.626 m)   Wt 173 lb (78.5 kg)   SpO2 98%   BMI 29.70 kg/m    Subjective:   [name], female    DOB: 03/05/1946, 74 y.o.   MRN: [id number]  HPI: Heather Barber is a 74 y.o. female presenting on 06/16/2020 for Medical Management of Chronic Issues, Diabetes, and Hypertension   HPI Hypertension Patient is currently on losartan and metoprolol and furosemide, and their blood pressure today is 118/65. Patient denies any lightheadedness or dizziness. Patient denies headaches, blurred vision, chest pains, shortness of breath, or weakness. Denies any side effects from medication and is content with current medication.   Hyperlipidemia Patient is coming in for recheck of his hyperlipidemia. The patient is currently taking atorvastatin. They deny any issues with myalgias or history of liver damage from it. They deny any focal numbness or weakness or chest pain.   Type 2 diabetes mellitus Patient comes in today for recheck of his diabetes. Patient has been currently taking no medication. Patient is currently on an ACE inhibitor/ARB. Patient has not seen an ophthalmologist this year. Patient denies any issues with their feet. The symptom started onset as an adult hypertension and hyperlipidemia ARE RELATED TO DM   Relevant past medical, surgical, family and social history reviewed and updated as indicated. Interim medical history since our last visit reviewed. Allergies and medications reviewed and updated.  Review of Systems  Constitutional: Negative for chills and fever.  HENT: Negative for congestion, ear discharge and ear pain.   Eyes: Negative for redness and visual disturbance.  Respiratory: Negative for chest tightness and shortness of breath.   Cardiovascular: Negative for chest pain and leg swelling.  Genitourinary: Negative for difficulty urinating and dysuria.  Musculoskeletal: Negative for back pain and  gait problem.  Skin: Positive for rash.  Neurological: Negative for light-headedness and headaches.  Psychiatric/Behavioral: Negative for agitation and behavioral problems.  All other systems reviewed and are negative.   Per HPI unless specifically indicated above   Allergies as of 06/16/2020      Reactions   Codeine Nausea And Vomiting   Morphine Other (See Comments)   GI symptoms      Medication List       Accurate as of June 16, 2020  2:00 PM. If you have any questions, ask your nurse or doctor.        STOP taking these medications   senna-docusate 8.6-50 MG tablet Commonly known as: Senokot-S Stopped by: Worthy Rancher, MD   traMADol 50 MG tablet Commonly known as: ULTRAM Stopped by: Fransisca Kaufmann Wyolene Weimann, MD     TAKE these medications   aspirin EC 81 MG tablet Take 81 mg by mouth daily.   atorvastatin 20 MG tablet Commonly known as: LIPITOR Take 1 tablet (20 mg total) by mouth at bedtime.   furosemide 20 MG tablet Commonly known as: LASIX TAKE 1 TABLET BY MOUTH EVERY DAY   hydroxyurea 500 MG capsule Commonly known as: HYDREA Take 1 capsule (500 mg total) by mouth 2 (two) times daily.   ibuprofen 600 MG tablet Commonly known as: ADVIL Take 1 tablet (600 mg total) by mouth every 6 (six) hours as needed for moderate pain. For AFTER surgery, use sparingly   losartan 50 MG tablet Commonly known as: COZAAR Take 1 tablet (50 mg total) by mouth daily.   metoprolol  succinate 50 MG 24 hr tablet Commonly known as: TOPROL-XL TAKE 1 TABLET (50 MG TOTAL) BY MOUTH 2 (TWO) TIMES A DAY. TAKE WITH OR IMMEDIATELY FOLLOWING A MEAL.   omeprazole 20 MG capsule Commonly known as: PRILOSEC Take 1 capsule (20 mg total) by mouth daily.        Objective:   BP 118/65   Pulse 87   Temp 97.8 F (36.6 C)   Ht '5\' 4"'  (1.626 m)   Wt 173 lb (78.5 kg)   SpO2 98%   BMI 29.70 kg/m   Wt Readings from Last 3 Encounters:  06/16/20 173 lb (78.5 kg)  04/13/20 176 lb  12.8 oz (80.2 kg)  01/16/20 175 lb 6 oz (79.5 kg)    Physical Exam Vitals and nursing note reviewed.  Constitutional:      General: She is not in acute distress.    Appearance: She is well-developed. She is not diaphoretic.  Eyes:     Conjunctiva/sclera: Conjunctivae normal.  Cardiovascular:     Rate and Rhythm: Normal rate and regular rhythm.     Heart sounds: Normal heart sounds. No murmur heard.   Pulmonary:     Effort: Pulmonary effort is normal. No respiratory distress.     Breath sounds: Normal breath sounds. No wheezing.  Musculoskeletal:        General: No tenderness. Normal range of motion.  Skin:    General: Skin is warm and dry.     Findings: Lesion (Patient has small skin lesion just above her left upper lip that is been there a couple months, she said it was darker and would like a mole at first but now it is lighter.,  It still stayed about the same in size.  She is try to use some antibiotic cream ) present. No rash.  Neurological:     Mental Status: She is alert and oriented to person, place, and time.     Coordination: Coordination normal.  Psychiatric:        Behavior: Behavior normal.       Assessment & Plan:   Problem List Items Addressed This Visit      Cardiovascular and Mediastinum   Hypertension   Relevant Orders   Lipid panel     Endocrine   Type 2 diabetes mellitus with other specified complication ([place]) - Primary   Relevant Orders   Bayer DCA Hb A1c Waived   CMP14+EGFR   Lipid panel     Other   Hyperlipidemia   Relevant Orders   Lipid panel    Other Visit Diagnoses    Postmenopausal       Relevant Orders   DG WR[facility] DEXA   Colon cancer screening       Atypical nevus          Patient hemoglobin A1c is 7.0.  No change in medication but focus on diet.  Last one was 6.9.  Will order bone density scan. Follow up plan: Return in about 3 months (around 09/16/2020), or if symptoms worsen or fail to improve, for Diabetes and  hypertension and cholesterol.  Counseling provided for all of the vaccine components Orders Placed This Encounter  Procedures  . Bayer [facility] Hb A1c [place], MD [facility] 06/16/2020, 2:00 PM

## 2020-06-17 LAB — CMP14+EGFR
ALT: 14 IU/L (ref 0–32)
AST: 14 IU/L (ref 0–40)
Albumin/Globulin Ratio: 2.2 (ref 1.2–2.2)
Albumin: 4.6 g/dL (ref 3.7–4.7)
Alkaline Phosphatase: 115 IU/L (ref 48–121)
BUN/Creatinine Ratio: 22 (ref 12–28)
BUN: 14 mg/dL (ref 8–27)
Bilirubin Total: 0.3 mg/dL (ref 0.0–1.2)
CO2: 25 mmol/L (ref 20–29)
Calcium: 9.4 mg/dL (ref 8.7–10.3)
Chloride: 99 mmol/L (ref 96–106)
Creatinine, Ser: 0.65 mg/dL (ref 0.57–1.00)
GFR calc Af Amer: 101 mL/min/{1.73_m2} (ref >59)
GFR calc non Af Amer: 88 mL/min/{1.73_m2} (ref >59)
Globulin, Total: 2.1 g/dL (ref 1.5–4.5)
Glucose: 174 mg/dL — ABNORMAL HIGH (ref 65–99)
Potassium: 4.5 mmol/L (ref 3.5–5.2)
Sodium: 139 mmol/L (ref 134–144)
Total Protein: 6.7 g/dL (ref 6.0–8.5)

## 2020-06-17 LAB — LIPID PANEL
Chol/HDL Ratio: 6.1 ratio — ABNORMAL HIGH (ref 0.0–4.4)
Cholesterol, Total: 200 mg/dL — ABNORMAL HIGH (ref 100–199)
HDL: 33 mg/dL — ABNORMAL LOW (ref >39)
LDL Chol Calc (NIH): 68 mg/dL (ref 0–99)
Triglycerides: 646 mg/dL (ref 0–149)
VLDL Cholesterol Cal: 99 mg/dL — ABNORMAL HIGH (ref 5–40)

## 2020-08-02 ENCOUNTER — Other Ambulatory Visit: Payer: Self-pay | Admitting: Cardiology

## 2020-08-16 ENCOUNTER — Other Ambulatory Visit (HOSPITAL_COMMUNITY): Payer: Self-pay | Admitting: *Deleted

## 2020-08-16 DIAGNOSIS — D473 Essential (hemorrhagic) thrombocythemia: Secondary | ICD-10-CM

## 2020-08-16 MED ORDER — HYDROXYUREA 500 MG PO CAPS: 500.0000 mg | ORAL_CAPSULE | Freq: Two times a day (BID) | ORAL | 3 refills | Status: DC

## 2020-09-07 ENCOUNTER — Other Ambulatory Visit: Payer: Self-pay | Admitting: Family Medicine

## 2020-09-16 ENCOUNTER — Encounter: Payer: Self-pay | Admitting: Family Medicine

## 2020-09-16 ENCOUNTER — Other Ambulatory Visit: Payer: Self-pay

## 2020-09-16 ENCOUNTER — Ambulatory Visit (INDEPENDENT_AMBULATORY_CARE_PROVIDER_SITE_OTHER): Payer: Medicare Other | Admitting: Family Medicine

## 2020-09-16 ENCOUNTER — Ambulatory Visit (INDEPENDENT_AMBULATORY_CARE_PROVIDER_SITE_OTHER): Payer: Medicare Other

## 2020-09-16 VITALS — BP 121/72 | HR 94 | Temp 98.0°F | Ht 64.0 in | Wt 168.0 lb

## 2020-09-16 DIAGNOSIS — Z23 Encounter for immunization: Secondary | ICD-10-CM

## 2020-09-16 DIAGNOSIS — Z1211 Encounter for screening for malignant neoplasm of colon: Secondary | ICD-10-CM | POA: Diagnosis not present

## 2020-09-16 DIAGNOSIS — Z780 Asymptomatic menopausal state: Secondary | ICD-10-CM | POA: Diagnosis not present

## 2020-09-16 DIAGNOSIS — E1169 Type 2 diabetes mellitus with other specified complication: Secondary | ICD-10-CM | POA: Diagnosis not present

## 2020-09-16 DIAGNOSIS — E781 Pure hyperglyceridemia: Secondary | ICD-10-CM | POA: Diagnosis not present

## 2020-09-16 LAB — BAYER DCA HB A1C WAIVED: HB A1C (BAYER DCA - WAIVED): 7 % — ABNORMAL HIGH (ref <7.0)

## 2020-09-16 MED ORDER — FENOFIBRATE 145 MG PO TABS: 145.0000 mg | ORAL_TABLET | Freq: Every day | ORAL | 3 refills | Status: DC

## 2020-09-16 MED ORDER — ATORVASTATIN CALCIUM 20 MG PO TABS: 20.0000 mg | ORAL_TABLET | Freq: Every day | ORAL | 3 refills | Status: DC

## 2020-09-16 MED ORDER — LOSARTAN POTASSIUM 50 MG PO TABS: 50.0000 mg | ORAL_TABLET | Freq: Every day | ORAL | 3 refills | Status: DC

## 2020-09-16 MED ORDER — OMEPRAZOLE 20 MG PO CPDR: 20.0000 mg | DELAYED_RELEASE_CAPSULE | Freq: Every day | ORAL | 3 refills | Status: DC

## 2020-09-16 NOTE — Progress Notes (Signed)
BP 121/72   Pulse 94   Temp 98 F (36.7 C)   Ht 5\' 4"  (1.626 m)   Wt 168 lb (76.2 kg)   SpO2 96%   BMI 28.84 kg/m    Subjective:   [name], female    DOB: 08-23-1946, 74 y.o.   MRN: [id number]  HPI: Heather Barber is a 74 y.o. female presenting on 09/16/2020 for Medical Management of Chronic Issues, Diabetes, and Hypertension   HPI Type 2 diabetes mellitus Patient comes in today for recheck of his diabetes. Patient has been currently taking no medication has been diet controlled, A1c 7.0. Patient is currently on an ACE inhibitor/ARB. Patient has seen an ophthalmologist this year. Patient denies any issues with their feet. The symptom started onset as an adult hypertension and hyperlipidemia ARE RELATED TO DM   Hypertension Patient is currently on furosemide and losartan and metoprolol, and their blood pressure today is 121/72. Patient denies any lightheadedness or dizziness. Patient denies headaches, blurred vision, chest pains, shortness of breath, or weakness. Denies any side effects from medication and is content with current medication.   Hyperlipidemia and hypertriglyceridemia Patient is coming in for recheck of his hyperlipidemia. The patient is currently taking atorvastatin. They deny any issues with myalgias or history of liver damage from it. They deny any focal numbness or weakness or chest pain.   GERD Patient is currently on omeprazole.  She denies any major symptoms or abdominal pain or belching or burping. She denies any blood in her stool or lightheadedness or dizziness.   Relevant past medical, surgical, family and social history reviewed and updated as indicated. Interim medical history since our last visit reviewed. Allergies and medications reviewed and updated.  Review of Systems  Constitutional: Negative for chills and fever.  Eyes: Negative for visual disturbance.  Respiratory: Negative for chest tightness and shortness of breath.     Cardiovascular: Negative for chest pain and leg swelling.  Musculoskeletal: Negative for back pain and gait problem.  Skin: Negative for rash.  Neurological: Negative for light-headedness and headaches.  Psychiatric/Behavioral: Negative for agitation and behavioral problems.  All other systems reviewed and are negative.   Per HPI unless specifically indicated above   Allergies as of 09/16/2020      Reactions   Codeine Nausea And Vomiting   Morphine Other (See Comments)   GI symptoms      Medication List       Accurate as of September 16, 2020  3:01 PM. If you have any questions, ask your nurse or doctor.        aspirin EC 81 MG tablet Take 81 mg by mouth daily.   atorvastatin 20 MG tablet Commonly known as: LIPITOR Take 1 tablet (20 mg total) by mouth at bedtime.   fenofibrate 145 MG tablet Commonly known as: Tricor Take 1 tablet (145 mg total) by mouth daily. Started by: Worthy Rancher, MD   furosemide 20 MG tablet Commonly known as: LASIX TAKE 1 TABLET BY MOUTH EVERY DAY   hydroxyurea 500 MG capsule Commonly known as: HYDREA Take 1 capsule (500 mg total) by mouth 2 (two) times daily.   ibuprofen 600 MG tablet Commonly known as: ADVIL Take 1 tablet (600 mg total) by mouth every 6 (six) hours as needed for moderate pain. For AFTER surgery, use sparingly   ketoconazole 2 % cream Commonly known as: NIZORAL Apply 1 application topically 2 (two) times daily.   losartan 50 MG  tablet Commonly known as: COZAAR Take 1 tablet (50 mg total) by mouth daily.   metoprolol succinate 50 MG 24 hr tablet Commonly known as: TOPROL-XL TAKE 1 TABLET (50 MG TOTAL) BY MOUTH 2 (TWO) TIMES A DAY. TAKE WITH OR IMMEDIATELY FOLLOWING A MEAL.   omeprazole 20 MG capsule Commonly known as: PRILOSEC Take 1 capsule (20 mg total) by mouth daily.        Objective:   BP 121/72   Pulse 94   Temp 98 F (36.7 C)   Ht 5\' 4"  (1.626 m)   Wt 168 lb (76.2 kg)   SpO2 96%   BMI  28.84 kg/m   Wt Readings from Last 3 Encounters:  09/16/20 168 lb (76.2 kg)  06/16/20 173 lb (78.5 kg)  04/13/20 176 lb 12.8 oz (80.2 kg)    Physical Exam Vitals and nursing note reviewed.  Constitutional:      General: She is not in acute distress.    Appearance: She is well-developed. She is not diaphoretic.  Eyes:     Conjunctiva/sclera: Conjunctivae normal.  Cardiovascular:     Rate and Rhythm: Normal rate and regular rhythm.     Heart sounds: Normal heart sounds. No murmur heard.   Pulmonary:     Effort: Pulmonary effort is normal. No respiratory distress.     Breath sounds: Normal breath sounds. No wheezing.  Musculoskeletal:        General: No tenderness. Normal range of motion.  Skin:    General: Skin is warm and dry.     Findings: No rash.  Neurological:     Mental Status: She is alert and oriented to person, place, and time.     Coordination: Coordination normal.  Psychiatric:        Behavior: Behavior normal.       Assessment & Plan:   Problem List Items Addressed This Visit      Endocrine   Type 2 diabetes mellitus with other specified complication ([place]) - Primary   Relevant Medications   atorvastatin (LIPITOR) 20 MG tablet   losartan (COZAAR) 50 MG tablet   Other Relevant Orders   Bayer DCA Hb A1c Waived     Other   Hypertriglyceridemia   Relevant Medications   atorvastatin (LIPITOR) 20 MG tablet   losartan (COZAAR) 50 MG tablet   fenofibrate (TRICOR) 145 MG tablet    Other Visit Diagnoses    Need for immunization against influenza       Relevant Orders   Flu Vaccine QUAD High Dose(Fluad) (Completed)   Colon cancer screening       Relevant Orders   Ambulatory referral to Gastroenterology    A1c is 7.0, no medication change for that, did add a fenofibrate because her triglycerides been consistently in the 600s.  Follow up plan: Return in about 3 months (around 12/17/2020), or if symptoms worsen or fail to improve, for Diabetes and  cholesterol.  Counseling provided for all of the vaccine components Orders Placed This Encounter  Procedures  . Flu Vaccine QUAD High Dose(Fluad)  . Bayer DCA Hb A1c Waived  . Ambulatory referral to Gastroenterology    Caryl Pina, MD [facility] 09/16/2020, 3:01 PM

## 2020-09-27 ENCOUNTER — Encounter: Payer: Self-pay | Admitting: Internal Medicine

## 2020-10-11 ENCOUNTER — Ambulatory Visit: Payer: Medicare Other

## 2020-10-11 ENCOUNTER — Encounter: Payer: Self-pay | Admitting: Family Medicine

## 2020-10-11 ENCOUNTER — Ambulatory Visit (INDEPENDENT_AMBULATORY_CARE_PROVIDER_SITE_OTHER): Payer: Medicare Other | Admitting: Family Medicine

## 2020-10-11 VITALS — BP 133/82 | HR 116 | Temp 96.5°F | Resp 18

## 2020-10-11 DIAGNOSIS — R059 Cough, unspecified: Secondary | ICD-10-CM | POA: Diagnosis not present

## 2020-10-11 DIAGNOSIS — R0981 Nasal congestion: Secondary | ICD-10-CM | POA: Diagnosis not present

## 2020-10-11 MED ORDER — BETAMETHASONE SOD PHOS & ACET 6 (3-3) MG/ML IJ SUSP
12.0000 mg | Freq: Once | INTRAMUSCULAR | Status: AC
  Administered 2020-10-11: 12 mg via INTRAMUSCULAR

## 2020-10-11 MED ORDER — BENZONATATE 200 MG PO CAPS: 200.0000 mg | ORAL_CAPSULE | Freq: Three times a day (TID) | ORAL | 0 refills | Status: DC | PRN

## 2020-10-11 NOTE — Progress Notes (Signed)
Chief Complaint  Patient presents with  . Cough  . Nasal Congestion    HPI  Patient presents today for 1week cough and congestion no fever chills or sweats.  The cough has been nonproductive she does not have sinus pressure.  She is hoarse and has some sore throat and drainage.  She denies headache.  She is not dyspneic.  She has not gotten relief from over-the-counter remedies including DayQuil and NyQuil.  PMH: Smoking status noted ROS: Per HPI  Objective: BP 133/82   Pulse (!) 116   Temp (!) 96.5 F (35.8 C) (Temporal)   Resp 18   SpO2 96%  Gen: NAD, alert, cooperative with exam HEENT: NCAT, EOMI, PERRL pharynx has mild erythema.  There is noted significant hoarseness with phonation. CV: RRR, good S1/S2, no murmur Resp: CTABL, no wheezes, non-labored Ext: No edema, warm Neuro: Alert and oriented, No gross deficits  Assessment and plan:  1. Cough   2. Nasal congestion     Meds ordered this encounter  Medications  . betamethasone acetate-betamethasone sodium phosphate (CELESTONE) injection 12 mg  . benzonatate (TESSALON) 200 MG capsule    Sig: Take 1 capsule (200 mg total) by mouth 3 (three) times daily as needed for cough.    Dispense:  20 capsule    Refill:  0    Orders Placed This Encounter  Procedures  . Novel Coronavirus, NAA ([facility])    Order Specific Question:   Is this test for diagnosis or screening    Answer:   Diagnosis of ill patient    Order Specific Question:   Symptomatic for COVID-19 as defined by CDC    Answer:   Yes    Order Specific Question:   Date of Symptom Onset    Answer:   10/04/2020    Order Specific Question:   Hospitalized for COVID-19    Answer:   No    Order Specific Question:   Admitted to ICU for COVID-19    Answer:   No    Order Specific Question:   Previously tested for COVID-19    Answer:   Yes    Order Specific Question:   Resident in a congregate (group) care setting    Answer:   No    Order Specific Question:   Is the  patient student?    Answer:   No    Order Specific Question:   Employed in healthcare setting    Answer:   No    Order Specific Question:   Pregnant    Answer:   No    Order Specific Question:   Has patient completed COVID vaccination(s) (2 doses of Pfizer/Moderna 1 dose of [employer])    Answer:   Yes    Follow up as needed.  Claretta Fraise, MD

## 2020-10-12 LAB — NOVEL CORONAVIRUS, NAA: SARS-CoV-2, NAA: NOT DETECTED

## 2020-10-12 LAB — SARS-COV-2, NAA 2 DAY TAT

## 2020-10-13 ENCOUNTER — Telehealth: Payer: Self-pay

## 2020-10-13 ENCOUNTER — Ambulatory Visit (HOSPITAL_COMMUNITY): Payer: Medicare Other | Admitting: Nurse Practitioner

## 2020-10-13 ENCOUNTER — Other Ambulatory Visit (HOSPITAL_COMMUNITY): Payer: Medicare Other

## 2020-10-13 NOTE — Telephone Encounter (Signed)
Letter written. Left detailed message on patients voicemail that note up front and ready for pick up or can print off Mychart

## 2020-10-13 NOTE — Progress Notes (Signed)
Hello Betsie,  Your lab result is normal and/or stable.Some minor variations that are not significant are commonly marked abnormal, but do not represent any medical problem for you.  Best regards, Chaylee Ehrsam, M.D.

## 2020-10-13 NOTE — Telephone Encounter (Signed)
Pt called stating that she saw Dr Livia Snellen on 10/11/20 in our [facility] and needs work note. Wants to know if she can be written out of work until next Monday (10/18/20) because she is still sick and needs time to recover.  Please advise and call patient. [phone]

## 2020-11-01 ENCOUNTER — Other Ambulatory Visit: Payer: Self-pay

## 2020-11-01 ENCOUNTER — Ambulatory Visit (INDEPENDENT_AMBULATORY_CARE_PROVIDER_SITE_OTHER): Payer: Self-pay | Admitting: *Deleted

## 2020-11-01 DIAGNOSIS — Z1211 Encounter for screening for malignant neoplasm of colon: Secondary | ICD-10-CM

## 2020-11-01 NOTE — Progress Notes (Signed)
Pt wanted to see if it would be ok not to have colonoscopy since she is so close to being 75 years old.  She denies having any GI problems at the present time.

## 2020-11-02 ENCOUNTER — Ambulatory Visit: Payer: Medicare Other | Admitting: Family Medicine

## 2020-11-03 ENCOUNTER — Other Ambulatory Visit (HOSPITAL_COMMUNITY): Payer: Self-pay | Admitting: *Deleted

## 2020-11-03 DIAGNOSIS — D75839 Thrombocytosis, unspecified: Secondary | ICD-10-CM

## 2020-11-04 ENCOUNTER — Inpatient Hospital Stay (HOSPITAL_COMMUNITY): Payer: Medicare Other

## 2020-11-04 ENCOUNTER — Ambulatory Visit (HOSPITAL_COMMUNITY): Payer: Medicare Other | Admitting: Hematology

## 2020-11-07 NOTE — Progress Notes (Signed)
When it comes down to it, it's really her choice. I looked in her chart and don't see a previous colonoscopy in Epic. Has she had one before? If so, does she remember when/where? While she is so close to 75, best practice (assuming she's due and/or overdue for one) would be to get one more to try and get her as far "down the road" with minimal chance of colon cancer. Also, while I understand she feels well, unfortunately colon cancer doesn't usually have a lot of symptoms until it's advanced, which is the logic behind completing screenings.  Let me know what she says or decides. Thanks

## 2020-11-09 ENCOUNTER — Telehealth: Payer: Self-pay | Admitting: *Deleted

## 2020-11-09 NOTE — Telephone Encounter (Signed)
Tried to call pt but had to leave a voice mail.

## 2020-11-17 ENCOUNTER — Other Ambulatory Visit (HOSPITAL_COMMUNITY): Payer: Self-pay | Admitting: Hematology

## 2020-11-17 DIAGNOSIS — D473 Essential (hemorrhagic) thrombocythemia: Secondary | ICD-10-CM

## 2020-11-17 NOTE — Telephone Encounter (Signed)
Left another message for pt to call me back.

## 2020-11-22 NOTE — Progress Notes (Signed)
Spoke to pt and she decided to do one more colonoscopy after giving her [place], NP's recommendations.  She asked me to make her a nurse visit in March due to the Covid numbers being so high right now.  She scheduled nurse visit for 01/18/21 at 4:00.  She thinks her last procedure was done in [place].

## 2020-12-02 ENCOUNTER — Telehealth: Payer: Self-pay | Admitting: *Deleted

## 2020-12-02 NOTE — Telephone Encounter (Signed)
Patient called back and scheduled an appt for 2/10 at 3:15 pm

## 2020-12-02 NOTE — Telephone Encounter (Signed)
Attempted to return the patient's call to schedule a follow up appt for March. Left a message to call the office back.

## 2020-12-06 ENCOUNTER — Inpatient Hospital Stay (HOSPITAL_COMMUNITY): Payer: Medicare Other | Attending: Hematology

## 2020-12-06 ENCOUNTER — Other Ambulatory Visit: Payer: Self-pay

## 2020-12-06 ENCOUNTER — Inpatient Hospital Stay (HOSPITAL_BASED_OUTPATIENT_CLINIC_OR_DEPARTMENT_OTHER): Payer: Medicare Other | Admitting: Hematology

## 2020-12-06 DIAGNOSIS — Z79899 Other long term (current) drug therapy: Secondary | ICD-10-CM | POA: Diagnosis not present

## 2020-12-06 DIAGNOSIS — D75839 Thrombocytosis, unspecified: Secondary | ICD-10-CM

## 2020-12-06 DIAGNOSIS — Z8542 Personal history of malignant neoplasm of other parts of uterus: Secondary | ICD-10-CM | POA: Diagnosis not present

## 2020-12-06 LAB — COMPREHENSIVE METABOLIC PANEL
ALT: 13 U/L (ref 0–44)
AST: 17 U/L (ref 15–41)
Albumin: 4.1 g/dL (ref 3.5–5.0)
Alkaline Phosphatase: 92 U/L (ref 38–126)
Anion gap: 12 (ref 5–15)
BUN: 15 mg/dL (ref 8–23)
CO2: 23 mmol/L (ref 22–32)
Calcium: 9 mg/dL (ref 8.9–10.3)
Chloride: 99 mmol/L (ref 98–111)
Creatinine, Ser: 0.89 mg/dL (ref 0.44–1.00)
GFR, Estimated: >60 mL/min (ref >60)
Glucose, Bld: 141 mg/dL — ABNORMAL HIGH (ref 70–99)
Potassium: 4.2 mmol/L (ref 3.5–5.1)
Sodium: 134 mmol/L — ABNORMAL LOW (ref 135–145)
Total Bilirubin: 0.6 mg/dL (ref 0.3–1.2)
Total Protein: 7.1 g/dL (ref 6.5–8.1)

## 2020-12-06 LAB — CBC WITH DIFFERENTIAL/PLATELET
Abs Immature Granulocytes: 0.03 10*3/uL (ref 0.00–0.07)
Basophils Absolute: 0 10*3/uL (ref 0.0–0.1)
Basophils Relative: 1 %
Eosinophils Absolute: 0.1 10*3/uL (ref 0.0–0.5)
Eosinophils Relative: 1 %
HCT: 34.3 % — ABNORMAL LOW (ref 36.0–46.0)
Hemoglobin: 12.2 g/dL (ref 12.0–15.0)
Immature Granulocytes: 1 %
Lymphocytes Relative: 36 %
Lymphs Abs: 2.3 10*3/uL (ref 0.7–4.0)
MCH: 41.8 pg — ABNORMAL HIGH (ref 26.0–34.0)
MCHC: 35.6 g/dL (ref 30.0–36.0)
MCV: 117.5 fL — ABNORMAL HIGH (ref 80.0–100.0)
Monocytes Absolute: 0.6 10*3/uL (ref 0.1–1.0)
Monocytes Relative: 9 %
Neutro Abs: 3.3 10*3/uL (ref 1.7–7.7)
Neutrophils Relative %: 52 %
Platelets: 272 10*3/uL (ref 150–400)
RBC: 2.92 MIL/uL — ABNORMAL LOW (ref 3.87–5.11)
RDW: 13 % (ref 11.5–15.5)
WBC: 6.3 10*3/uL (ref 4.0–10.5)
nRBC: 0 % (ref 0.0–0.2)

## 2020-12-06 NOTE — Progress Notes (Signed)
[place] [place], [place] [address]   CLINIC:  Medical Oncology/Hematology  PCP:  Dettinger, Fransisca Kaufmann, MD [place] / [place] [address]  [phone]  REASON FOR VISIT:  Follow-up for thrombocytosis  PRIOR THERAPY: None  CURRENT THERAPY: Hydrea 500 mg BID  INTERVAL HISTORY:  [name], a 75 y.o. female, returns for routine follow-up for her thrombocytosis. Kyeisha was last seen on 04/13/2020.  Today she reports feeling well. She is taking Hydrea 500 mg BID, in the AM and PM. She continues having F/U with Dr. Denman George and will see her on 02/10. She denies having any itching, wounds, F/C, night sweats, Raynaud phenomenon.   REVIEW OF SYSTEMS:  Review of Systems  Constitutional: Positive for fatigue (75%). Negative for appetite change, chills, diaphoresis and fever.  Skin: Negative for itching and wound.  All other systems reviewed and are negative.   PAST MEDICAL/SURGICAL HISTORY:  Past Medical History:  Diagnosis Date  . Anxiety   . Atrial septal defect    Surgical repair in 1994  . Chest pain    Normal coronary angiography in 1994 and 2011  . Dizziness   . Endometrial cancer, grade I ([place]) 10/09/2019  . Essential thrombocythemia ([place]) 06/06/2016  . GERD (gastroesophageal reflux disease)   . History of kidney stones   . Hyperlipidemia   . Hypertension   . LBBB (left bundle branch block) 2011  . Palpitations   . PONV (postoperative nausea and vomiting)   . Syncope   . Type 2 diabetes mellitus with other specified complication ([place]) 8/41/6606  . Ventricular tachycardia ([place])    Exercise induced   Past Surgical History:  Procedure Laterality Date  . APPENDECTOMY    . ASD [place] EXCISIONAL BIOPSY     Right and left  . BREAST SURGERY     right and left breast-benign  . CARDIAC CATHETERIZATION  12/24/2009   [name]   . CATARACT EXTRACTION W/PHACO Left 03/11/2013   Procedure:  CATARACT EXTRACTION PHACO AND INTRAOCULAR LENS PLACEMENT (IOC);  Surgeon: [name], MD;  Location: [facility];  Service: Ophthalmology;  Laterality: Left;  CDE:  12.20  . HYSTEROSCOPY WITH D & C N/A 11/11/2019   Procedure: DILATATION AND CURETTAGE /HYSTEROSCOPY;  Surgeon: [name], MD;  Location: [facility];  Service: Gynecology;  Laterality: N/A;  . POLYPECTOMY N/A 11/11/2019   Procedure: POLYPECTOMY(ENDOMETRIAL POLYP);  Surgeon: [name], MD;  Location: [facility];  Service: Gynecology;  Laterality: N/A;  . ROBOTIC ASSISTED LAPAROSCOPIC HYSTERECTOMY AND SALPINGECTOMY Bilateral 12/09/2019   Procedure: XI ROBOTIC ASSISTED LAPAROSCOPIC HYSTERECTOMY BILATERAL SALPINGOOOPHORECTOMY;  Surgeon: [name], MD;  Location: [facility];  Service: Gynecology;  Laterality: Bilateral;  . SENTINEL NODE BIOPSY N/A 12/09/2019   Procedure: SENTINEL LYMPH  NODE BIOPSY;  Surgeon: [name], MD;  Location: [facility];  Service: Gynecology;  Laterality: N/A;  . TONSILLECTOMY    . TUBAL LIGATION     Bilateral    SOCIAL HISTORY:  Social History   Socioeconomic History  . Marital status: Widowed    Spouse name: widowed  . Number of children: 3  . Years of education: Not on file  . Highest education level: Not on file  Occupational History  . Occupation: Psychologist, prison and probation services  Tobacco Use  . Smoking status: Never Smoker  . Smokeless tobacco: Never Used  Vaping Use  . Vaping Use: Never  used  Substance and Sexual Activity  . Alcohol use: No  . Drug use: No  . Sexual activity: Not Currently    Birth control/protection: Post-menopausal  Other Topics Concern  . Not on file  Social History Narrative   Married and resides in [place] with husband and 3 children   Sedentary lifestyle   Social Determinants of Health   Financial Resource Strain: Not on file  Food Insecurity: Not on file  Transportation Needs: No Transportation Needs  . Lack of Transportation (Medical): No  . Lack of Transportation  (Non-Medical): No  Physical Activity: Inactive  . Days of Exercise per Week: 0 days  . Minutes of Exercise per Session: 0 min  Stress: Not on file  Social Connections: Not on file  Intimate Partner Violence: Not At Risk  . Fear of Current or Ex-Partner: No  . Emotionally Abused: No  . Physically Abused: No  . Sexually Abused: No    FAMILY HISTORY:  Family History  Problem Relation Age of Onset  . Cancer Mother        female  . Stroke Sister   . Cancer Sister        breast  . Heart failure Father   . Coronary artery disease Neg Hx     CURRENT MEDICATIONS:  Current Outpatient Medications  Medication Sig Dispense Refill  . aspirin EC 81 MG tablet Take 81 mg by mouth daily.    Marland Kitchen atorvastatin (LIPITOR) 20 MG tablet Take 1 tablet (20 mg total) by mouth at bedtime. 90 tablet 3  . fenofibrate (TRICOR) 145 MG tablet Take 1 tablet (145 mg total) by mouth daily. 90 tablet 3  . furosemide (LASIX) 20 MG tablet TAKE 1 TABLET BY MOUTH EVERY DAY 90 tablet 1  . hydroxyurea (HYDREA) 500 MG capsule TAKE 1 CAPSULE BY MOUTH TWICE A DAY 180 capsule 1  . ibuprofen (ADVIL) 600 MG tablet Take 1 tablet (600 mg total) by mouth every 6 (six) hours as needed for moderate pain. For AFTER surgery, use sparingly (Patient taking differently: Take 600 mg by mouth as needed for moderate pain. For AFTER surgery, use sparingly) 30 tablet 0  . ketoconazole (NIZORAL) 2 % cream Apply 1 application topically 2 (two) times daily. (Patient taking differently: Apply 1 application topically as needed.) 60 g 0  . losartan (COZAAR) 50 MG tablet Take 1 tablet (50 mg total) by mouth daily. 90 tablet 3  . metoprolol succinate (TOPROL-XL) 50 MG 24 hr tablet TAKE 1 TABLET (50 MG TOTAL) BY MOUTH 2 (TWO) TIMES A DAY. TAKE WITH OR IMMEDIATELY FOLLOWING A MEAL. 180 tablet 3  . omeprazole (PRILOSEC) 20 MG capsule Take 1 capsule (20 mg total) by mouth daily. 90 capsule 3   No current facility-administered medications for this visit.     ALLERGIES:  Allergies  Allergen Reactions  . Codeine Nausea And Vomiting  . Morphine Other (See Comments)    GI symptoms    PHYSICAL EXAM:  Performance status (ECOG): 1 - Symptomatic but completely ambulatory  Vitals:   12/06/20 1440  BP: 131/72  Pulse: 81  Resp: 17  Temp: 98.5 F (36.9 C)  SpO2: 98%   Wt Readings from Last 3 Encounters:  12/06/20 167 lb 3 oz (75.8 kg)  09/16/20 168 lb (76.2 kg)  06/16/20 173 lb (78.5 kg)   Physical Exam Vitals reviewed.  Constitutional:      Appearance: Normal appearance.  Cardiovascular:     Rate and Rhythm: Normal rate and  regular rhythm.     Pulses: Normal pulses.     Heart sounds: Normal heart sounds.  Pulmonary:     Effort: Pulmonary effort is normal.     Breath sounds: Normal breath sounds.  Abdominal:     Palpations: Abdomen is soft. There is no hepatomegaly, splenomegaly or mass.     Tenderness: There is no abdominal tenderness.     Hernia: No hernia is present.  Musculoskeletal:     Right lower leg: No edema.     Left lower leg: No edema.  Skin:    Findings: No wound.  Neurological:     General: No focal deficit present.     Mental Status: She is alert and oriented to person, place, and time.  Psychiatric:        Mood and Affect: Mood normal.        Behavior: Behavior normal.     LABORATORY DATA:  I have reviewed the labs as listed.  CBC Latest Ref Rng & Units 12/06/2020 04/06/2020 01/16/2020  WBC 4.0 - 10.5 K/uL 6.3 5.5 5.8  Hemoglobin 12.0 - 15.0 g/dL 12.2 12.0 12.6  Hematocrit 36.0 - 46.0 % 34.3(L) 33.9(L) 33.5(L)  Platelets 150 - 400 K/uL 272 292 341   CMP Latest Ref Rng & Units 12/06/2020 06/16/2020 04/06/2020  Glucose 70 - 99 mg/dL 141(H) 174(H) 113(H)  BUN 8 - 23 mg/dL '15 14 16  ' Creatinine 0.44 - 1.00 mg/dL 0.89 0.65 0.70  Sodium 135 - 145 mmol/L 134(L) 139 140  Potassium 3.5 - 5.1 mmol/L 4.2 4.5 4.3  Chloride 98 - 111 mmol/L 99 99 100  CO2 22 - 32 mmol/L '23 25 26  ' Calcium 8.9 - 10.3 mg/dL 9.0 9.4 9.3   Total Protein 6.5 - 8.1 g/dL 7.1 6.7 7.1  Total Bilirubin 0.3 - 1.2 mg/dL 0.6 0.3 0.5  Alkaline Phos 38 - 126 U/L 92 115 98  AST 15 - 41 U/L '17 14 15  ' ALT 0 - 44 U/L '13 14 16      ' Component Value Date/Time   RBC 2.92 (L) 12/06/2020 1341   MCV 117.5 (H) 12/06/2020 1341   MCV 104 (H) 01/16/2020 1536   MCH 41.8 (H) 12/06/2020 1341   MCHC 35.6 12/06/2020 1341   RDW 13.0 12/06/2020 1341   RDW 13.0 01/16/2020 1536   LYMPHSABS 2.3 12/06/2020 1341   LYMPHSABS 2.6 01/16/2020 1536   MONOABS 0.6 12/06/2020 1341   EOSABS 0.1 12/06/2020 1341   EOSABS 0.3 01/16/2020 1536   BASOSABS 0.0 12/06/2020 1341   BASOSABS 0.1 01/16/2020 1536    DIAGNOSTIC IMAGING:  I have independently reviewed the scans and discussed with the patient. No results found.   ASSESSMENT:  1. JAK-2+ essential thrombocytosis: -No prior history of thrombosis.  No vasomotor symptoms. -High risk because of her advanced age. -Hydroxyurea 500 mg twice daily started.  2.  Stage Ia grade 1 endometrioid endometrial adenocarcinoma: -Status post robotic staging on 12/09/2019. -MMR normal.   PLAN:  1. JAK-2+ essential thrombocytosis: -She is taking hydroxyurea 500 mg twice daily. -She does not report any aquagenic pruritus or vasomotor symptoms. -CBC on 12/06/2020 shows hematocrit 34 and platelet count 272 within the target range. -Continue same dose of hydroxyurea. -RTC 6 months for follow-up.  2.  Stage Ia grade 1 endometrioid adenocarcinoma: -Due to low risk of recurrence, no adjuvant therapy was recommended. -Continue follow-up with Dr. Denman George every 6 months for 5 years.  Orders placed this encounter:  No orders of the  defined types were placed in this encounter.    Derek Jack, MD [place] [phone]   I, Milinda Antis, am acting as a scribe for Dr. Sanda Linger.  I, Derek Jack MD, have reviewed the above documentation for accuracy and completeness, and I agree  with the above.

## 2020-12-06 NOTE — Patient Instructions (Signed)
[facility] at [facility] Discharge Instructions  You were seen today by Dr. Katragadda. He went over your recent results. Dr. Katragadda will see you back in 6 months for labs and follow up.   Thank you for choosing [facility] at [facility] to provide your oncology and hematology care.  To afford each patient quality time with our provider, please arrive at least 15 minutes before your scheduled appointment time.   If you have a lab appointment with the Cancer Center please come in thru the Main Entrance and check in at the main information desk  You need to re-schedule your appointment should you arrive 10 or more minutes late.  We strive to give you quality time with our providers, and arriving late affects you and other patients whose appointments are after yours.  Also, if you no show three or more times for appointments you may be dismissed from the clinic at the providers discretion.     Again, thank you for choosing [facility].  Our hope is that these requests will decrease the amount of time that you wait before being seen by our physicians.       _____________________________________________________________  Should you have questions after your visit to  Cancer Center, please contact our office at [phone] between the hours of 8:00 a.m. and 4:30 p.m.  Voicemails left after 4:00 p.m. will not be returned until the following business day.  For prescription refill requests, have your pharmacy contact our office and allow 72 hours.    Cancer Center Support Programs:   > Cancer Support Group  2nd Tuesday of the month 1pm-2pm, Journey Room    

## 2020-12-08 ENCOUNTER — Telehealth: Payer: Self-pay

## 2020-12-08 NOTE — Telephone Encounter (Signed)
TC to review MU, no answer, left message to return call.  

## 2020-12-09 ENCOUNTER — Inpatient Hospital Stay: Payer: Medicare Other

## 2020-12-09 ENCOUNTER — Encounter: Payer: Self-pay | Admitting: Gynecologic Oncology

## 2020-12-09 ENCOUNTER — Other Ambulatory Visit: Payer: Self-pay

## 2020-12-09 ENCOUNTER — Inpatient Hospital Stay: Payer: Medicare Other | Attending: Gynecologic Oncology | Admitting: Gynecologic Oncology

## 2020-12-09 ENCOUNTER — Other Ambulatory Visit: Payer: Self-pay | Admitting: *Deleted

## 2020-12-09 VITALS — BP 138/67 | HR 93 | Temp 97.9°F | Resp 18 | Wt 168.4 lb

## 2020-12-09 DIAGNOSIS — Z90710 Acquired absence of both cervix and uterus: Secondary | ICD-10-CM | POA: Diagnosis not present

## 2020-12-09 DIAGNOSIS — C541 Malignant neoplasm of endometrium: Secondary | ICD-10-CM | POA: Diagnosis present

## 2020-12-09 DIAGNOSIS — R300 Dysuria: Secondary | ICD-10-CM

## 2020-12-09 DIAGNOSIS — R350 Frequency of micturition: Secondary | ICD-10-CM | POA: Insufficient documentation

## 2020-12-09 DIAGNOSIS — Z90722 Acquired absence of ovaries, bilateral: Secondary | ICD-10-CM | POA: Insufficient documentation

## 2020-12-09 MED ORDER — NITROFURANTOIN MONOHYD MACRO 100 MG PO CAPS: 100.0000 mg | ORAL_CAPSULE | Freq: Two times a day (BID) | ORAL | 0 refills | Status: DC

## 2020-12-09 NOTE — Patient Instructions (Signed)
Dr Denman George is prescribing an antibiotic for your bladder irritability.  She will test your urine for infection.  Please follow-up with Dr Johnnye Sima office in 6 months.  Please contact Dr Serita Grit office (at [phone]) in November to request an appointment with her for February, 2023.

## 2020-12-09 NOTE — Progress Notes (Signed)
Gynecologic Oncology Follow-up  Chief Complaint:  Chief Complaint  Patient presents with  . Endometrial cancer [facility])    Assessment and Plan:  Ms Camposano is a 75 year old woman with a history of stage IA grade 1 endometrioid endometrial adenocarcinoma, s/p robotic staging on 12/09/19. MMR normal/MMR stable  Pathology revealed low risk factors for recurrence, therefore no adjuvant therapy is recommended according to NCCN guidelines.  I discussed risk for recurrence and typical symptoms encouraged her to notify us of these should they develop between visits.  I recommend she have follow-up every 6 months for 5 years in accordance with NCCN guidelines. Those visits should include symptom assessment, physical exam and pelvic examination. Pap smears are not indicated or recommended in the routine surveillance of endometrial cancer.  I will check a urine culture today for her dysuria  And have empirically Rx'd macrobid x 1 week. If culture reveals organism not covered by macrobid, will change.  She will follow-up in 6 months at Dr Johnnye Sima office for a pelvic exam and in 12 months with me.   HPI: 76 year old P4 who was seen in consultation at the request of Dr Glo Herring for evaluation of grade 1 endometrioid endometrial cancer.  The patient reported postmenopausal bleeding that began in February 2020.  This stopped a few months after began and she did not seek medical care for in 2020 until reemerged in December 2020.  She then saw Dr. Glo Herring who performed a transvaginal ultrasound scan which showed a uterus measuring 5.3 x 3.7 x 4.3 cm with an endometrial stripe of 16 mm which was enlarged.  The ovaries were grossly normal.  Due to the endometrial thickening she went to the operating room on November 11, 2019 where a D&C was performed and endometrial polypectomy.  Final pathology from that procedure revealed FIGO grade 1 endometrioid endometrial adenocarcinoma.  Interval Hx:  On 12/09/19 she  underwent a robotic assisted total hysterectomy with BSO and SLN biopsy. Intraoperative findings were significant for bilateral mapping, normal appearing uterus. Surgery was uncomplicated.  Final pathology revealed a FIGO grade 1 adenocarcinoma of the endometrium (endometrioid), 7 of 98m myo invasion (inner half), no LVSI, no cervical/adnexal/nodal involvement. MSI stable/MMR normal. It met low risk features for recurrence and therefore no adjuvant therapy was recommended in accordance with NCCN guidelines.  Since surgery she has done well with no complaints.  She has no symptoms concerning for recurrence. She does report a 1 week history of urinary frequency and dysuria. She denies fevers and chills.   Current Meds:  Outpatient Encounter Medications as of 12/09/2020  Medication Sig  . nitrofurantoin, macrocrystal-monohydrate, (MACROBID) 100 MG capsule Take 1 capsule (100 mg total) by mouth 2 (two) times daily.  .Marland Kitchenaspirin EC 81 MG tablet Take 81 mg by mouth daily.  .Marland Kitchenatorvastatin (LIPITOR) 20 MG tablet Take 1 tablet (20 mg total) by mouth at bedtime.  . fenofibrate (TRICOR) 145 MG tablet Take 1 tablet (145 mg total) by mouth daily.  . furosemide (LASIX) 20 MG tablet TAKE 1 TABLET BY MOUTH EVERY DAY  . hydroxyurea (HYDREA) 500 MG capsule TAKE 1 CAPSULE BY MOUTH TWICE A DAY  . ibuprofen (ADVIL) 600 MG tablet Take 1 tablet (600 mg total) by mouth every 6 (six) hours as needed for moderate pain. For AFTER surgery, use sparingly (Patient taking differently: Take 600 mg by mouth as needed for moderate pain. For AFTER surgery, use sparingly)  . ketoconazole (NIZORAL) 2 % cream Apply 1 application  topically 2 (two) times daily. (Patient taking differently: Apply 1 application topically as needed.)  . losartan (COZAAR) 50 MG tablet Take 1 tablet (50 mg total) by mouth daily.  . metoprolol succinate (TOPROL-XL) 50 MG 24 hr tablet TAKE 1 TABLET (50 MG TOTAL) BY MOUTH 2 (TWO) TIMES A DAY. TAKE WITH OR  IMMEDIATELY FOLLOWING A MEAL.  Marland Kitchen omeprazole (PRILOSEC) 20 MG capsule Take 1 capsule (20 mg total) by mouth daily.   No facility-administered encounter medications on file as of 12/09/2020.    Allergy:  Allergies  Allergen Reactions  . Codeine Nausea And Vomiting  . Morphine Other (See Comments)    GI symptoms    Social Hx:   Social History   Socioeconomic History  . Marital status: Widowed    Spouse name: widowed  . Number of children: 3  . Years of education: Not on file  . Highest education level: Not on file  Occupational History  . Occupation: Psychologist, prison and probation services  Tobacco Use  . Smoking status: Never Smoker  . Smokeless tobacco: Never Used  Vaping Use  . Vaping Use: Never used  Substance and Sexual Activity  . Alcohol use: No  . Drug use: No  . Sexual activity: Not Currently    Birth control/protection: Post-menopausal  Other Topics Concern  . Not on file  Social History Narrative   Married and resides in [place] with husband and 3 children   Sedentary lifestyle   Social Determinants of Health   Financial Resource Strain: Not on file  Food Insecurity: Not on file  Transportation Needs: No Transportation Needs  . Lack of Transportation (Medical): No  . Lack of Transportation (Non-Medical): No  Physical Activity: Inactive  . Days of Exercise per Week: 0 days  . Minutes of Exercise per Session: 0 min  Stress: Not on file  Social Connections: Not on file  Intimate Partner Violence: Not At Risk  . Fear of Current or Ex-Partner: No  . Emotionally Abused: No  . Physically Abused: No  . Sexually Abused: No    Past Surgical Hx:  Past Surgical History:  Procedure Laterality Date  . APPENDECTOMY    . ASD [place] EXCISIONAL BIOPSY     Right and left  . BREAST SURGERY     right and left breast-benign  . CARDIAC CATHETERIZATION  12/24/2009   [name]   . CATARACT EXTRACTION W/PHACO Left 03/11/2013   Procedure:  CATARACT EXTRACTION PHACO AND INTRAOCULAR LENS PLACEMENT (IOC);  Surgeon: [name], MD;  Location: [facility];  Service: Ophthalmology;  Laterality: Left;  CDE:  12.20  . HYSTEROSCOPY WITH D & C N/A 11/11/2019   Procedure: DILATATION AND CURETTAGE /HYSTEROSCOPY;  Surgeon: [name], MD;  Location: [facility];  Service: Gynecology;  Laterality: N/A;  . POLYPECTOMY N/A 11/11/2019   Procedure: POLYPECTOMY(ENDOMETRIAL POLYP);  Surgeon: [name], MD;  Location: [facility];  Service: Gynecology;  Laterality: N/A;  . ROBOTIC ASSISTED LAPAROSCOPIC HYSTERECTOMY AND SALPINGECTOMY Bilateral 12/09/2019   Procedure: XI ROBOTIC ASSISTED LAPAROSCOPIC HYSTERECTOMY BILATERAL SALPINGOOOPHORECTOMY;  Surgeon: [name], MD;  Location: [facility];  Service: Gynecology;  Laterality: Bilateral;  . SENTINEL NODE BIOPSY N/A 12/09/2019   Procedure: SENTINEL LYMPH  NODE BIOPSY;  Surgeon: [name], MD;  Location: [facility];  Service: Gynecology;  Laterality: N/A;  . TONSILLECTOMY    . TUBAL LIGATION     Bilateral    Past Medical Hx:  Past Medical History:  Diagnosis Date  . Anxiety   . Atrial septal defect    Surgical repair in 1994  . Chest pain    Normal coronary angiography in 1994 and 2011  . Dizziness   . Endometrial cancer, grade I ([place]) 10/09/2019  . Essential thrombocythemia ([place]) 06/06/2016  . GERD (gastroesophageal reflux disease)   . History of kidney stones   . Hyperlipidemia   . Hypertension   . LBBB (left bundle branch block) 2011  . Palpitations   . PONV (postoperative nausea and vomiting)   . Syncope   . Type 2 diabetes mellitus with other specified complication ([place]) 7/79/3903  . Ventricular tachycardia ([name])    Exercise induced    Past Gynecological History:  SVD x 4 No LMP recorded. Patient has had a hysterectomy.  Family Hx:  Family History  Problem Relation Age of Onset  . Cancer Mother        female  . Stroke Sister   . Cancer Sister        breast  . Heart failure Father    . Coronary artery disease Neg Hx     Review of Systems:  Constitutional  Feels well,    ENT Normal appearing ears and nares bilaterally Skin/Breast  No rash, sores, jaundice, itching, dryness Cardiovascular  No chest pain, shortness of breath, or edema  Pulmonary  No cough or wheeze.  Gastro Intestinal  No nausea, vomitting, or diarrhoea. No bright red blood per rectum, no abdominal pain, change in bowel movement, or constipation.  Genito Urinary  No frequency, urgency, dysuria, no bleeding Musculo Skeletal  No myalgia, arthralgia, joint swelling or pain  Neurologic  No weakness, numbness, change in gait,  Psychology  No depression, anxiety, insomnia.   Vitals:  Blood pressure 138/67, pulse 93, temperature 97.9 F (36.6 C), temperature source Tympanic, resp. rate 18, weight 168 lb 6.4 oz (76.4 kg), SpO2 98 %.  Physical Exam: WD in NAD Neck  Supple NROM, without any enlargements.  Lymph Node Survey No cervical supraclavicular or inguinal adenopathy Cardiovascular  Pulse normal rate, regularity and rhythm. S1 and S2 normal.  Lungs  Clear to auscultation bilateraly, without wheezes/crackles/rhonchi. Good air movement.  Skin  No rash/lesions/breakdown  Psychiatry  Alert and oriented to person, place, and time  Abdomen  Normoactive bowel sounds, abdomen soft, non-tender and obese without evidence of hernia. Well healed incisions. Back No CVA tenderness Genito Urinary  Vulva/vagina: Normal external female genitalia.  No lesions. No discharge or bleeding.  Bladder/urethra:  No lesions or masses, well supported bladder, nontender.   Vagina: smooth, no lesions.  Rectal  deferred Extremities  No bilateral cyanosis, clubbing or edema.  Thereasa Solo, MD  12/09/2020, 4:54 PM

## 2020-12-12 LAB — URINE CULTURE: Culture: >=100000 — AB

## 2020-12-13 ENCOUNTER — Telehealth: Payer: Self-pay

## 2020-12-13 ENCOUNTER — Telehealth: Payer: Self-pay | Admitting: Cardiology

## 2020-12-13 NOTE — Telephone Encounter (Signed)
LM for Heather Barber to call the office to discuss her urinary symptoms from 12-09-20

## 2020-12-13 NOTE — Telephone Encounter (Signed)
1. What dental office are you calling from? [facility] - Dr Kermit Balo  2. What is your office phone number? [phone]  3. What is your fax number? [phone]   4. What type of procedure is the patient having performed? tbd   5. What date is procedure scheduled or is the patient there now? Extraction  (if the patient is at the dentist's office question goes to their cardiologist if he/she is in the office.  If not, question should go to the DOD).   6. What is your question (ex. Antibiotics prior to procedure, holding medication-we need to know how long dentist wants pt to hold med)?  Does she need premed ? What is this medication for and does patient need to stop it? hydroxyurea (HYDREA) 500 MG capsule

## 2020-12-14 ENCOUNTER — Other Ambulatory Visit: Payer: Self-pay | Admitting: Gynecologic Oncology

## 2020-12-14 DIAGNOSIS — N3000 Acute cystitis without hematuria: Secondary | ICD-10-CM

## 2020-12-14 DIAGNOSIS — C541 Malignant neoplasm of endometrium: Secondary | ICD-10-CM

## 2020-12-14 MED ORDER — SULFAMETHOXAZOLE-TRIMETHOPRIM 800-160 MG PO TABS: 1.0000 | ORAL_TABLET | Freq: Two times a day (BID) | ORAL | 0 refills | Status: DC

## 2020-12-14 NOTE — Telephone Encounter (Signed)
Notes faxed to surgeon. This phone note will be removed from the preop pool. Richardson Dopp, PA-C  12/14/2020 9:06 AM

## 2020-12-14 NOTE — Progress Notes (Signed)
See RN note. Changing antibiotics to Bactrim based on urine culture results.

## 2020-12-14 NOTE — Telephone Encounter (Signed)
   Primary Cardiologist: Carlyle Dolly, MD  Chart reviewed as part of pre-operative protocol coverage.   Hx: ASD s/p repair in her 4s, prior VT, LBBB, normal EF, normal cath in 1994 & 2011, hyperlipidemia, thrombocytosis. Echocardiogram w bubble study in 2019 without residual atrial level shunt.  Last seen by Dr. Harl Bowie in 10/2019.  Simple dental extractions are considered low risk procedures per guidelines and generally do not require any specific cardiac clearance. It is also generally accepted that for simple extractions and dental cleanings, there is no need to interrupt blood thinner therapy.  SBE prophylaxis is not required for the patient from a cardiac standpoint.  **Of note, hydroxyurea is not prescribed by Dr. Harl Bowie.  Her oncologist, Dr. Derek Jack, prescribes this medication and the dentist should discuss whether or not to hold this medication for dental procedures with that provider.**  Please call with questions. Richardson Dopp, PA-C 12/14/2020, 8:15 AM

## 2020-12-14 NOTE — Telephone Encounter (Signed)
LM for Heather Barber to call the office to discuss her urinary symptoms from 12-09-20

## 2020-12-14 NOTE — Telephone Encounter (Signed)
Ms Heather Barber states that the urinary burning, frequency, and urgency has improved but symptoms remain. Encouraged her to drink at least 64 oz of water daily to flush out the bacteria and the antibiotic medication to keep her kidneys functional well. Heather Barber will send in Bactrim DS to her pharmacy. The culture showed the organism in her urine is more sensitive to this ATB. She is to stop the Macrobid and take the Bactrim DS bid for 3 days.  Take a dose this evening since she took the Macrobid this morning.  Pt verbalized understanding.

## 2020-12-17 ENCOUNTER — Other Ambulatory Visit: Payer: Self-pay

## 2020-12-17 ENCOUNTER — Encounter: Payer: Self-pay | Admitting: Family Medicine

## 2020-12-17 ENCOUNTER — Ambulatory Visit (INDEPENDENT_AMBULATORY_CARE_PROVIDER_SITE_OTHER): Payer: Medicare Other | Admitting: Family Medicine

## 2020-12-17 VITALS — BP 114/62 | HR 88 | Ht 64.0 in | Wt 170.0 lb

## 2020-12-17 DIAGNOSIS — E1169 Type 2 diabetes mellitus with other specified complication: Secondary | ICD-10-CM | POA: Diagnosis not present

## 2020-12-17 DIAGNOSIS — I10 Essential (primary) hypertension: Secondary | ICD-10-CM

## 2020-12-17 DIAGNOSIS — E781 Pure hyperglyceridemia: Secondary | ICD-10-CM

## 2020-12-17 DIAGNOSIS — E782 Mixed hyperlipidemia: Secondary | ICD-10-CM | POA: Diagnosis not present

## 2020-12-17 LAB — BAYER DCA HB A1C WAIVED: HB A1C (BAYER DCA - WAIVED): 6.7 %

## 2020-12-17 NOTE — Progress Notes (Signed)
BP 114/62   Pulse 88   Ht 5\' 4"  (1.626 m)   Wt 170 lb (77.1 kg)   SpO2 96%   BMI 29.18 kg/m    Subjective:   [name], female    DOB: Sep 30, 1946, 75 y.o.   MRN: [id number]  HPI: Heather Barber is a 75 y.o. female presenting on 12/17/2020 for Medical Management of Chronic Issues, Diabetes, and Hypertension   HPI Type 2 diabetes mellitus Patient comes in today for recheck of his diabetes. Patient has been currently taking no medication and has been diet controlled, A1c 6.7. Patient is currently on an ACE inhibitor/ARB. Patient has not seen an ophthalmologist this year. Patient denies any issues with their feet. The symptom started onset as an adult hypertension and hyperlipidemia and hypertriglyceridemia ARE RELATED TO DM   Hypertension Patient is currently on furosemide and losartan and metoprolol, and their blood pressure today is 114/62. Patient denies any lightheadedness or dizziness. Patient denies headaches, blurred vision, chest pains, shortness of breath, or weakness. Denies any side effects from medication and is content with current medication.   Hyperlipidemia and hypertriglyceridemia Patient is coming in for recheck of his hyperlipidemia. The patient is currently taking fenofibrate and atorvastatin. They deny any issues with myalgias or history of liver damage from it. They deny any focal numbness or weakness or chest pain.   Patient has thrombocytosis and sees hematology for this.  Patient has SBT and ASD and history of V. tach and sees cardiology for these.  Relevant past medical, surgical, family and social history reviewed and updated as indicated. Interim medical history since our last visit reviewed. Allergies and medications reviewed and updated.  Review of Systems  Constitutional: Negative for chills and fever.  Eyes: Negative for redness and visual disturbance.  Respiratory: Negative for chest tightness and shortness of breath.    Cardiovascular: Negative for chest pain and leg swelling.  Musculoskeletal: Negative for back pain and gait problem.  Skin: Negative for rash.  Neurological: Negative for dizziness, light-headedness and headaches.  Psychiatric/Behavioral: Negative for agitation and behavioral problems.  All other systems reviewed and are negative.   Per HPI unless specifically indicated above   Allergies as of 12/17/2020      Reactions   Codeine Nausea And Vomiting   Morphine Other (See Comments)   GI symptoms      Medication List       Accurate as of December 17, 2020  3:50 PM. If you have any questions, ask your nurse or doctor.        aspirin EC 81 MG tablet Take 81 mg by mouth daily.   atorvastatin 20 MG tablet Commonly known as: LIPITOR Take 1 tablet (20 mg total) by mouth at bedtime.   fenofibrate 145 MG tablet Commonly known as: Tricor Take 1 tablet (145 mg total) by mouth daily.   furosemide 20 MG tablet Commonly known as: LASIX TAKE 1 TABLET BY MOUTH EVERY DAY   hydroxyurea 500 MG capsule Commonly known as: HYDREA TAKE 1 CAPSULE BY MOUTH TWICE A DAY   ibuprofen 600 MG tablet Commonly known as: ADVIL Take 1 tablet (600 mg total) by mouth every 6 (six) hours as needed for moderate pain. For AFTER surgery, use sparingly What changed: when to take this   ketoconazole 2 % cream Commonly known as: NIZORAL Apply 1 application topically 2 (two) times daily. What changed:   when to take this  reasons to take this  losartan 50 MG tablet Commonly known as: COZAAR Take 1 tablet (50 mg total) by mouth daily.   metoprolol succinate 50 MG 24 hr tablet Commonly known as: TOPROL-XL TAKE 1 TABLET (50 MG TOTAL) BY MOUTH 2 (TWO) TIMES A DAY. TAKE WITH OR IMMEDIATELY FOLLOWING A MEAL.   nitrofurantoin (macrocrystal-monohydrate) 100 MG capsule Commonly known as: Macrobid Take 1 capsule (100 mg total) by mouth 2 (two) times daily.   omeprazole 20 MG capsule Commonly known  as: PRILOSEC Take 1 capsule (20 mg total) by mouth daily.   sulfamethoxazole-trimethoprim 800-160 MG tablet Commonly known as: BACTRIM DS Take 1 tablet by mouth 2 (two) times daily.        Objective:   BP 114/62   Pulse 88   Ht 5\' 4"  (1.626 m)   Wt 170 lb (77.1 kg)   SpO2 96%   BMI 29.18 kg/m   Wt Readings from Last 3 Encounters:  12/17/20 170 lb (77.1 kg)  12/09/20 168 lb 6.4 oz (76.4 kg)  12/06/20 167 lb 3 oz (75.8 kg)    Physical Exam Vitals and nursing note reviewed.  Constitutional:      General: She is not in acute distress.    Appearance: She is well-developed and well-nourished. She is not diaphoretic.  Eyes:     Extraocular Movements: EOM normal.     Conjunctiva/sclera: Conjunctivae normal.  Cardiovascular:     Rate and Rhythm: Normal rate and regular rhythm.     Pulses: Intact distal pulses.     Heart sounds: Normal heart sounds. No murmur heard.   Pulmonary:     Effort: Pulmonary effort is normal. No respiratory distress.     Breath sounds: Normal breath sounds. No wheezing.  Musculoskeletal:        General: No tenderness or edema. Normal range of motion.  Skin:    General: Skin is warm and dry.     Findings: No rash.  Neurological:     Mental Status: She is alert and oriented to person, place, and time.     Coordination: Coordination normal.  Psychiatric:        Mood and Affect: Mood and affect normal.        Behavior: Behavior normal.       Assessment & Plan:   Problem List Items Addressed This Visit      Cardiovascular and Mediastinum   Hypertension     Endocrine   Type 2 diabetes mellitus with other specified complication ([place]) - Primary   Relevant Orders   Bayer DCA Hb A1c Waived (Completed)     Other   Hyperlipidemia   Hypertriglyceridemia      Continue current medication, continue to follow-up with cardiology and hematology and things are going well with there.  A1c today is 6.7 which is diet controlled so we will continue  forward with that. Follow up plan: Return in about 3 months (around 03/16/2021), or if symptoms worsen or fail to improve, for Diabetes and hypertension and triglycerides and cholesterol.  Counseling provided for all of the vaccine components Orders Placed This Encounter  Procedures  . Bayer [facility] Hb A1c Onyx, MD [facility] 12/17/2020, 3:50 PM

## 2021-01-18 ENCOUNTER — Ambulatory Visit (INDEPENDENT_AMBULATORY_CARE_PROVIDER_SITE_OTHER): Payer: Self-pay | Admitting: *Deleted

## 2021-01-18 ENCOUNTER — Other Ambulatory Visit: Payer: Self-pay

## 2021-01-18 VITALS — Ht 64.0 in | Wt 169.8 lb

## 2021-01-18 DIAGNOSIS — Z1211 Encounter for screening for malignant neoplasm of colon: Secondary | ICD-10-CM

## 2021-01-18 NOTE — Progress Notes (Signed)
Gastroenterology Pre-Procedure Review  Request Date: 01/18/2021 Requesting Physician: Dr. Warrick Parisian @ [facility], Last TCS done 7 to 8 years ago in [place], one polyp per pt  PATIENT REVIEW QUESTIONS: The patient responded to the following health history questions as indicated:    1. Diabetes Melitis: yes, borderline but without medicine 2. Joint replacements in the past 12 months: no 3. Major health problems in the past 3 months: no 4. Has an artificial valve or MVP: no 5. Has a defibrillator: no 6. Has been advised in past to take antibiotics in advance of a procedure like teeth cleaning: no 7. Family history of colon cancer: no  8. Alcohol Use: no 9. Illicit drug Use: no 10. History of sleep apnea: no  11. History of coronary artery or other vascular stents placed within the last 12 months: no 12. History of any prior anesthesia complications: yes, hard to wake after hysterectomy 1 to 2 years ago ([facility]-Dr. Denman George) 13. Body mass index is 29.15 kg/m.    MEDICATIONS & ALLERGIES:    Patient reports the following regarding taking any blood thinners:   Plavix? no Aspirin? yes Coumadin? no Brilinta? no Xarelto? no Eliquis? no Pradaxa? no Savaysa? no Effient? no  Patient confirms/reports the following medications:  Current Outpatient Medications  Medication Sig Dispense Refill  . aspirin EC 81 MG tablet Take 81 mg by mouth daily.    Marland Kitchen atorvastatin (LIPITOR) 20 MG tablet Take 1 tablet (20 mg total) by mouth at bedtime. 90 tablet 3  . fenofibrate (TRICOR) 145 MG tablet Take 1 tablet (145 mg total) by mouth daily. 90 tablet 3  . furosemide (LASIX) 20 MG tablet TAKE 1 TABLET BY MOUTH EVERY DAY 90 tablet 1  . hydroxyurea (HYDREA) 500 MG capsule TAKE 1 CAPSULE BY MOUTH TWICE A DAY 180 capsule 1  . ibuprofen (ADVIL) 600 MG tablet Take 1 tablet (600 mg total) by mouth every 6 (six) hours as needed for moderate pain. For AFTER surgery, use sparingly (Patient taking differently: Take 600 mg by mouth  as needed for moderate pain. For AFTER surgery, use sparingly) 30 tablet 0  . ketoconazole (NIZORAL) 2 % cream Apply 1 application topically 2 (two) times daily. (Patient taking differently: Apply 1 application topically as needed.) 60 g 0  . losartan (COZAAR) 50 MG tablet Take 1 tablet (50 mg total) by mouth daily. 90 tablet 3  . metoprolol succinate (TOPROL-XL) 50 MG 24 hr tablet TAKE 1 TABLET (50 MG TOTAL) BY MOUTH 2 (TWO) TIMES A DAY. TAKE WITH OR IMMEDIATELY FOLLOWING A MEAL. 180 tablet 3  . Multiple Vitamins-Minerals (MULTIVITAMIN WOMEN 50+) TABS Take by mouth daily.    . naproxen sodium (ALEVE) 220 MG tablet Take 220 mg by mouth as needed.    Marland Kitchen omeprazole (PRILOSEC) 20 MG capsule Take 1 capsule (20 mg total) by mouth daily. 90 capsule 3   No current facility-administered medications for this visit.    Patient confirms/reports the following allergies:  Allergies  Allergen Reactions  . Codeine Nausea And Vomiting  . Morphine Other (See Comments)    GI symptoms    No orders of the defined types were placed in this encounter.   AUTHORIZATION INFORMATION Primary Insurance: Medicare,  ID #: [id number] Pre-Cert / Auth required: No, not required  Secondary Insurance: [place],  [place] #:[id number] ,  Group #: [id number] [id number] Pre-Cert / Auth required: No, not required  SCHEDULE INFORMATION: Procedure has been scheduled as follows:  Date: , Time: Location:  This Gastroenterology Pre-Precedure Review Form is being routed to the following provider(s): Neil Crouch, PA

## 2021-01-18 NOTE — Progress Notes (Signed)
Triaged pt and she informed me that she really doesn't want to have this done.  Informed her that Walden Field, NP recommended one last colonoscopy.  She said that she recently lost her husband, had a hysterectomy, and doesn't feel like she is in the right state of mind to have this done.  Says she will contact us if any future problems or concerns.  Routing to Neil Crouch, [place] as Juluis Rainier.

## 2021-01-25 NOTE — Progress Notes (Signed)
Noted  

## 2021-01-30 ENCOUNTER — Other Ambulatory Visit: Payer: Self-pay | Admitting: Cardiology

## 2021-02-28 ENCOUNTER — Inpatient Hospital Stay (HOSPITAL_COMMUNITY)
Admission: EM | Admit: 2021-02-28 | Discharge: 2021-03-05 | DRG: 308 | Disposition: A | Discharge status: Home / Self Care | Payer: Medicare Other | Attending: Internal Medicine | Admitting: Internal Medicine

## 2021-02-28 ENCOUNTER — Emergency Department (HOSPITAL_COMMUNITY): Payer: Medicare Other

## 2021-02-28 ENCOUNTER — Encounter (HOSPITAL_COMMUNITY): Payer: Self-pay | Admitting: *Deleted

## 2021-02-28 ENCOUNTER — Other Ambulatory Visit: Payer: Self-pay

## 2021-02-28 DIAGNOSIS — Z87442 Personal history of urinary calculi: Secondary | ICD-10-CM

## 2021-02-28 DIAGNOSIS — Z7982 Long term (current) use of aspirin: Secondary | ICD-10-CM

## 2021-02-28 DIAGNOSIS — Z885 Allergy status to narcotic agent status: Secondary | ICD-10-CM

## 2021-02-28 DIAGNOSIS — I4892 Unspecified atrial flutter: Secondary | ICD-10-CM | POA: Diagnosis present

## 2021-02-28 DIAGNOSIS — I447 Left bundle-branch block, unspecified: Secondary | ICD-10-CM | POA: Diagnosis present

## 2021-02-28 DIAGNOSIS — Z8774 Personal history of (corrected) congenital malformations of heart and circulatory system: Secondary | ICD-10-CM

## 2021-02-28 DIAGNOSIS — Z8542 Personal history of malignant neoplasm of other parts of uterus: Secondary | ICD-10-CM

## 2021-02-28 DIAGNOSIS — D75839 Thrombocytosis, unspecified: Secondary | ICD-10-CM | POA: Diagnosis present

## 2021-02-28 DIAGNOSIS — I429 Cardiomyopathy, unspecified: Secondary | ICD-10-CM | POA: Diagnosis present

## 2021-02-28 DIAGNOSIS — I4891 Unspecified atrial fibrillation: Secondary | ICD-10-CM | POA: Diagnosis not present

## 2021-02-28 DIAGNOSIS — I088 Other rheumatic multiple valve diseases: Secondary | ICD-10-CM | POA: Diagnosis present

## 2021-02-28 DIAGNOSIS — Z79899 Other long term (current) drug therapy: Secondary | ICD-10-CM

## 2021-02-28 DIAGNOSIS — Z823 Family history of stroke: Secondary | ICD-10-CM

## 2021-02-28 DIAGNOSIS — I110 Hypertensive heart disease with heart failure: Secondary | ICD-10-CM | POA: Diagnosis present

## 2021-02-28 DIAGNOSIS — Z9842 Cataract extraction status, left eye: Secondary | ICD-10-CM

## 2021-02-28 DIAGNOSIS — I509 Heart failure, unspecified: Secondary | ICD-10-CM

## 2021-02-28 DIAGNOSIS — K219 Gastro-esophageal reflux disease without esophagitis: Secondary | ICD-10-CM | POA: Diagnosis present

## 2021-02-28 DIAGNOSIS — Z961 Presence of intraocular lens: Secondary | ICD-10-CM | POA: Diagnosis present

## 2021-02-28 DIAGNOSIS — I5023 Acute on chronic systolic (congestive) heart failure: Secondary | ICD-10-CM | POA: Diagnosis present

## 2021-02-28 DIAGNOSIS — E1169 Type 2 diabetes mellitus with other specified complication: Secondary | ICD-10-CM | POA: Diagnosis present

## 2021-02-28 DIAGNOSIS — Z90722 Acquired absence of ovaries, bilateral: Secondary | ICD-10-CM

## 2021-02-28 DIAGNOSIS — Z90710 Acquired absence of both cervix and uterus: Secondary | ICD-10-CM

## 2021-02-28 DIAGNOSIS — Z20822 Contact with and (suspected) exposure to covid-19: Secondary | ICD-10-CM | POA: Diagnosis present

## 2021-02-28 DIAGNOSIS — I440 Atrioventricular block, first degree: Secondary | ICD-10-CM | POA: Diagnosis present

## 2021-02-28 DIAGNOSIS — Z8249 Family history of ischemic heart disease and other diseases of the circulatory system: Secondary | ICD-10-CM

## 2021-02-28 DIAGNOSIS — I472 Ventricular tachycardia: Secondary | ICD-10-CM | POA: Diagnosis present

## 2021-02-28 LAB — COMPREHENSIVE METABOLIC PANEL
ALT: 23 U/L (ref 0–44)
AST: 17 U/L (ref 15–41)
Albumin: 4 g/dL (ref 3.5–5.0)
Alkaline Phosphatase: 130 U/L — ABNORMAL HIGH (ref 38–126)
Anion gap: 8 (ref 5–15)
BUN: 18 mg/dL (ref 8–23)
CO2: 27 mmol/L (ref 22–32)
Calcium: 8.9 mg/dL (ref 8.9–10.3)
Chloride: 105 mmol/L (ref 98–111)
Creatinine, Ser: 0.77 mg/dL (ref 0.44–1.00)
GFR, Estimated: >60 mL/min (ref >60)
Glucose, Bld: 136 mg/dL — ABNORMAL HIGH (ref 70–99)
Potassium: 3.8 mmol/L (ref 3.5–5.1)
Sodium: 140 mmol/L (ref 135–145)
Total Bilirubin: 0.8 mg/dL (ref 0.3–1.2)
Total Protein: 7 g/dL (ref 6.5–8.1)

## 2021-02-28 LAB — RESP PANEL BY RT-PCR (FLU A&B, COVID) ARPGX2
Influenza A by PCR: NEGATIVE
Influenza B by PCR: NEGATIVE
SARS Coronavirus 2 by RT PCR: NEGATIVE

## 2021-02-28 LAB — TSH: TSH: 3.423 u[IU]/mL (ref 0.350–4.500)

## 2021-02-28 LAB — CBC
HCT: 33 % — ABNORMAL LOW (ref 36.0–46.0)
Hemoglobin: 11.5 g/dL — ABNORMAL LOW (ref 12.0–15.0)
MCH: 42.1 pg — ABNORMAL HIGH (ref 26.0–34.0)
MCHC: 34.8 g/dL (ref 30.0–36.0)
MCV: 120.9 fL — ABNORMAL HIGH (ref 80.0–100.0)
Platelets: 269 10*3/uL (ref 150–400)
RBC: 2.73 MIL/uL — ABNORMAL LOW (ref 3.87–5.11)
RDW: 13.3 % (ref 11.5–15.5)
WBC: 6 10*3/uL (ref 4.0–10.5)
nRBC: 0 % (ref 0.0–0.2)

## 2021-02-28 LAB — TROPONIN I (HIGH SENSITIVITY)
Troponin I (High Sensitivity): 12 ng/L (ref <18)
Troponin I (High Sensitivity): 12 ng/L (ref <18)

## 2021-02-28 LAB — MAGNESIUM: Magnesium: 1.7 mg/dL (ref 1.7–2.4)

## 2021-02-28 LAB — BRAIN NATRIURETIC PEPTIDE: B Natriuretic Peptide: 599 pg/mL — ABNORMAL HIGH (ref 0.0–100.0)

## 2021-02-28 MED ORDER — ACETAMINOPHEN 650 MG RE SUPP: 650.0000 mg | Freq: Four times a day (QID) | RECTAL | Status: DC | PRN

## 2021-02-28 MED ORDER — ONDANSETRON HCL 4 MG/2ML IJ SOLN: 4.0000 mg | Freq: Four times a day (QID) | INTRAMUSCULAR | Status: DC | PRN

## 2021-02-28 MED ORDER — IOHEXOL 350 MG/ML SOLN
100.0000 mL | Freq: Once | INTRAVENOUS | Status: AC | PRN
  Administered 2021-02-28: 100 mL via INTRAVENOUS

## 2021-02-28 MED ORDER — LOSARTAN POTASSIUM 50 MG PO TABS
50.0000 mg | ORAL_TABLET | Freq: Every day | ORAL | Status: DC
  Administered 2021-03-01: 50 mg via ORAL
  Filled 2021-02-28 (×2): qty 1

## 2021-02-28 MED ORDER — FUROSEMIDE 10 MG/ML IJ SOLN
20.0000 mg | Freq: Once | INTRAMUSCULAR | Status: AC
  Administered 2021-02-28: 20 mg via INTRAVENOUS
  Filled 2021-02-28: qty 2

## 2021-02-28 MED ORDER — APIXABAN 5 MG PO TABS
5.0000 mg | ORAL_TABLET | Freq: Two times a day (BID) | ORAL | Status: DC
  Administered 2021-02-28 – 2021-03-05 (×10): 5 mg via ORAL
  Filled 2021-02-28 (×10): qty 1

## 2021-02-28 MED ORDER — ACETAMINOPHEN 325 MG PO TABS
650.0000 mg | ORAL_TABLET | Freq: Four times a day (QID) | ORAL | Status: DC | PRN
  Administered 2021-03-02: 650 mg via ORAL
  Filled 2021-02-28: qty 2

## 2021-02-28 MED ORDER — ATORVASTATIN CALCIUM 20 MG PO TABS
20.0000 mg | ORAL_TABLET | Freq: Every day | ORAL | Status: DC
  Administered 2021-02-28 – 2021-03-04 (×5): 20 mg via ORAL
  Filled 2021-02-28 (×5): qty 1

## 2021-02-28 MED ORDER — METOPROLOL SUCCINATE ER 50 MG PO TB24
50.0000 mg | ORAL_TABLET | Freq: Two times a day (BID) | ORAL | Status: DC
  Administered 2021-02-28 – 2021-03-01 (×2): 50 mg via ORAL
  Filled 2021-02-28 (×2): qty 1

## 2021-02-28 MED ORDER — FENOFIBRATE 160 MG PO TABS
160.0000 mg | ORAL_TABLET | Freq: Every day | ORAL | Status: DC
  Administered 2021-03-01 – 2021-03-05 (×5): 160 mg via ORAL
  Filled 2021-02-28 (×7): qty 1

## 2021-02-28 MED ORDER — METOPROLOL TARTRATE 5 MG/5ML IV SOLN
5.0000 mg | INTRAVENOUS | Status: DC | PRN
Start: 2021-02-28 — End: 2021-03-01
  Administered 2021-02-28: 5 mg via INTRAVENOUS
  Filled 2021-02-28: qty 5

## 2021-02-28 MED ORDER — ONDANSETRON HCL 4 MG PO TABS: 4.0000 mg | ORAL_TABLET | Freq: Four times a day (QID) | ORAL | Status: DC | PRN

## 2021-02-28 MED ORDER — PANTOPRAZOLE SODIUM 40 MG PO TBEC
40.0000 mg | DELAYED_RELEASE_TABLET | Freq: Every day | ORAL | Status: DC
  Administered 2021-02-28 – 2021-03-05 (×6): 40 mg via ORAL
  Filled 2021-02-28 (×6): qty 1

## 2021-02-28 NOTE — H&P (Signed)
History and Physical    Heather Barber TDD:[id number] DOB: [birth year] DOA: 02/28/2021  PCP: Dettinger, Fransisca Kaufmann, MD   Patient coming from: Home  I have personally briefly reviewed patient's old medical records in [facility]  Chief Complaint: Difficulty breathing  HPI: Heather Barber is a 75 y.o. female with medical history significant for LBBB, ventricular tachycardia, hypertension, diabetes mellitus, endometrial cancer. Patient presented to the ED with complaints of chest pain and difficulty breathing that started last night when was lying down to sleep.  She was unable to sleep flat.  Chest pain described as a tightness and mostly mid chest.  No radiation.  She reports associated palpitations and feeling her heart fluttering.  This has been ongoing for the past 2 weeks.  She has chronic intermittent leg swelling that is unchanged.  She feels bloated. No cough.  No fevers no chills.  Does not consistently check her weight. No prior history of irregular heart rhythm.  ED Course: Heart rate ranging from 80s to 121.  Blood pressure systolic 623J to 628B.  O2 sats greater than 95% on room air.  BNP 599.  Troponins flat at 12.  EKG showing new atrial fibrillation.  CTA chest no PE, shows cardiomegaly and trace bilateral pleural effusions right greater than left. IV Lasix 20 mg x 1 given.  Hospitalist to admit.  Review of Systems: As per HPI all other systems reviewed and negative.  Past Medical History:  Diagnosis Date  . Anxiety   . Atrial septal defect    Surgical repair in 1994  . Chest pain    Normal coronary angiography in 1994 and 2011  . Dizziness   . Endometrial cancer, grade I ([place]) 10/09/2019  . Essential thrombocythemia ([place]) 06/06/2016  . GERD (gastroesophageal reflux disease)   . History of kidney stones   . Hyperlipidemia   . Hypertension   . LBBB (left bundle branch block) 2011  . Palpitations   . PONV (postoperative nausea and vomiting)   . Syncope   . Type 2  diabetes mellitus with other specified complication ([place]) 1/51/7616  . Ventricular tachycardia ([place])    Exercise induced    Past Surgical History:  Procedure Laterality Date  . APPENDECTOMY    . ASD [place] EXCISIONAL BIOPSY     Right and left  . BREAST SURGERY     right and left breast-benign  . CARDIAC CATHETERIZATION  12/24/2009   [name]   . CATARACT EXTRACTION W/PHACO Left 03/11/2013   Procedure: CATARACT EXTRACTION PHACO AND INTRAOCULAR LENS PLACEMENT (IOC);  Surgeon: [name], MD;  Location: [facility];  Service: Ophthalmology;  Laterality: Left;  CDE:  12.20  . HYSTEROSCOPY WITH D & C N/A 11/11/2019   Procedure: DILATATION AND CURETTAGE /HYSTEROSCOPY;  Surgeon: [name], MD;  Location: [facility];  Service: Gynecology;  Laterality: N/A;  . POLYPECTOMY N/A 11/11/2019   Procedure: POLYPECTOMY(ENDOMETRIAL POLYP);  Surgeon: [name], MD;  Location: [facility];  Service: Gynecology;  Laterality: N/A;  . ROBOTIC ASSISTED LAPAROSCOPIC HYSTERECTOMY AND SALPINGECTOMY Bilateral 12/09/2019   Procedure: XI ROBOTIC ASSISTED LAPAROSCOPIC HYSTERECTOMY BILATERAL SALPINGOOOPHORECTOMY;  Surgeon: [name], MD;  Location: [facility];  Service: Gynecology;  Laterality: Bilateral;  . SENTINEL NODE BIOPSY N/A 12/09/2019   Procedure: SENTINEL LYMPH  NODE BIOPSY;  Surgeon: [name], MD;  Location: [facility];  Service: Gynecology;  Laterality: N/A;  . TONSILLECTOMY    .  TUBAL LIGATION     Bilateral     reports that she has never smoked. She has never used smokeless tobacco. She reports that she does not drink alcohol and does not use drugs.  Allergies  Allergen Reactions  . Codeine Nausea And Vomiting  . Morphine Other (See Comments)    GI symptoms    Family History  Problem Relation Age of Onset  . Cancer Mother        female  . Stroke Sister   . Cancer Sister        breast  . Heart failure Father   . Coronary artery disease Neg Hx      Prior to Admission medications   Medication Sig Start Date End Date Taking? Authorizing Provider  aspirin EC 81 MG tablet Take 81 mg by mouth daily.   Yes [provider]  atorvastatin (LIPITOR) 20 MG tablet Take 1 tablet (20 mg total) by mouth at bedtime. 09/16/20  Yes Dettinger, Fransisca Kaufmann, MD  fenofibrate (TRICOR) 145 MG tablet Take 1 tablet (145 mg total) by mouth daily. 09/16/20  Yes Dettinger, Fransisca Kaufmann, MD  furosemide (LASIX) 20 MG tablet TAKE 1 TABLET BY MOUTH EVERY DAY 01/31/21  Yes Branch, Alphonse Guild, MD  hydroxyurea (HYDREA) 500 MG capsule TAKE 1 CAPSULE BY MOUTH TWICE A DAY 11/17/20  Yes Derek Jack, MD  ibuprofen (ADVIL) 600 MG tablet Take 1 tablet (600 mg total) by mouth every 6 (six) hours as needed for moderate pain. For AFTER surgery, use sparingly Patient taking differently: Take 600 mg by mouth as needed for moderate pain. For AFTER surgery, use sparingly 11/18/19  Yes Cross, Melissa D, NP  ketoconazole (NIZORAL) 2 % cream Apply 1 application topically 2 (two) times daily. Patient taking differently: Apply 1 application topically as needed. 06/16/20  Yes Dettinger, Fransisca Kaufmann, MD  losartan (COZAAR) 50 MG tablet Take 1 tablet (50 mg total) by mouth daily. 09/16/20  Yes Dettinger, Fransisca Kaufmann, MD  metoprolol succinate (TOPROL-XL) 50 MG 24 hr tablet TAKE 1 TABLET (50 MG TOTAL) BY MOUTH 2 (TWO) TIMES A DAY. TAKE WITH OR IMMEDIATELY FOLLOWING A MEAL. 05/07/20  Yes Branch, Alphonse Guild, MD  Multiple Vitamins-Minerals (MULTIVITAMIN WOMEN 50+) TABS Take by mouth daily.   Yes [provider]  naproxen sodium (ALEVE) 220 MG tablet Take 220 mg by mouth as needed.   Yes [provider]  omeprazole (PRILOSEC) 20 MG capsule Take 1 capsule (20 mg total) by mouth daily. 09/16/20  Yes Dettinger, Fransisca Kaufmann, MD    Physical Exam: Vitals:   02/28/21 1300 02/28/21 1321 02/28/21 1330 02/28/21 1433  BP: (!) 122/96  123/88 (!) 114/98  Pulse: (!) 105  62 (!) 121  Resp: (!) 22   20 (!) 27  Temp:      TempSrc:      SpO2: 95%  96% 97%  Weight:  81.2 kg    Height:  5\' 3"  (1.6 m)      Constitutional: NAD, calm, comfortable Vitals:   02/28/21 1300 02/28/21 1321 02/28/21 1330 02/28/21 1433  BP: (!) 122/96  123/88 (!) 114/98  Pulse: (!) 105  62 (!) 121  Resp: (!) 22  20 (!) 27  Temp:      TempSrc:      SpO2: 95%  96% 97%  Weight:  81.2 kg    Height:  5\' 3"  (1.6 m)     Eyes: PERRL, lids and conjunctivae normal ENMT: Mucous membranes are moist.  Neck:  normal, supple, no masses, no thyromegaly Respiratory: clear to auscultation bilaterally, no wheezing, no crackles. Normal respiratory effort. No accessory muscle use.  Cardiovascular: Regular rate and rhythm, no murmurs / rubs / gallops.  Trace pitting extremity edema around ankles. 2+ pedal pulses.  Abdomen: no tenderness, no masses palpated. No hepatosplenomegaly. Bowel sounds positive.  Musculoskeletal: no clubbing / cyanosis. No joint deformity upper and lower extremities. Good ROM, no contractures. Skin: no rashes, lesions, ulcers. No induration Neurologic: No apparent cranial abnormality, moving extremities spontaneously. Psychiatric: Normal judgment and insight. Alert and oriented x 3. Normal mood.   Labs on Admission: I have personally reviewed following labs and imaging studies  CBC: Recent Labs  Lab 02/28/21 1149  WBC 6.0  HGB 11.5*  HCT 33.0*  MCV 120.9*  PLT [id number]   Basic Metabolic Panel: Recent Labs  Lab 02/28/21 1213  NA 140  K 3.8  CL 105  CO2 27  GLUCOSE 136*  BUN 18  CREATININE 0.77  CALCIUM 8.9  MG 1.7   Liver Function Tests: Recent Labs  Lab 02/28/21 1213  AST 17  ALT 23  ALKPHOS 130*  BILITOT 0.8  PROT 7.0  ALBUMIN 4.0   Thyroid Function Tests: Recent Labs    02/28/21 1213  TSH 3.423    Radiological Exams on Admission: DG Chest 2 View  Result Date: 02/28/2021 CLINICAL DATA:  Short of breath EXAM: CHEST - 2 VIEW COMPARISON:  12/17/2017 FINDINGS: Sternotomy  wires overlie normal cardiac silhouette. Lungs are hyperinflated. There is peripheral interstitial linear thickening. Small pleural effusions. No focal consolidation. On the lateral projection, there is a new density of the posterior aspect of the T11 vertebral body IMPRESSION: 1. Interstitial edema pattern and small effusions. 2. Hyperinflated lungs. 3. New posterior lower lobe density on the lateral projection. Recommend CT of the thorax for further evaluation Electronically Signed   By: Suzy Bouchard M.D.   On: 02/28/2021 12:35   CT Angio Chest PE W/Cm &/Or Wo Cm  Result Date: 02/28/2021 CLINICAL DATA:  Chest pressure since last night, arrhythmia EXAM: CT ANGIOGRAPHY CHEST WITH CONTRAST TECHNIQUE: Multidetector CT imaging of the chest was performed using the standard protocol during bolus administration of intravenous contrast. Multiplanar CT image reconstructions and MIPs were obtained to evaluate the vascular anatomy. CONTRAST:  159mL OMNIPAQUE IOHEXOL 350 MG/ML SOLN COMPARISON:  02/28/2021 FINDINGS: Cardiovascular: This is a technically adequate evaluation of the pulmonary vasculature. No filling defects or pulmonary emboli. Heart is enlarged without pericardial effusion. Reflux of contrast into the hepatic veins suggests cardiac dysfunction. No evidence of thoracic aortic aneurysm or dissection. Mild atherosclerosis. Mediastinum/Nodes: No enlarged mediastinal, hilar, or axillary lymph nodes. Thyroid gland, trachea, and esophagus demonstrate no significant findings. Lungs/Pleura: There are trace bilateral pleural effusions, right greater than left. No airspace disease or pneumothorax. The central airways are patent. Upper Abdomen: No acute abnormality. Musculoskeletal: No acute or destructive bony lesions. Reconstructed images demonstrate no additional findings. Review of the MIP images confirms the above findings. IMPRESSION: 1. No evidence of pulmonary embolus. 2. Cardiomegaly, with reflux of contrast  into the hepatic veins suggesting cardiac dysfunction. 3. Trace bilateral pleural effusions, right greater than left. 4.  Aortic Atherosclerosis (ICD10-I70.0). Electronically Signed   By: Randa Ngo M.D.   On: 02/28/2021 15:55    EKG: Independently reviewed.  Irregular rhythm, occasional P waves seen.  Rate 90, QTc 481.  QRS 144.  Old left bundle branch block.  Assessment/Plan Principal Problem:   New onset atrial fibrillation (  [place]) Active Problems:   Thrombocytosis   Type 2 diabetes mellitus with other specified complication ([name])   Symptomatic new onset atrial fibrillation-rates mostly 80s -121. CHADsVasc score at least 4 ( Age, sex, HTN, DM).  No frequent falls, no history of GI blood loss.  Last Echocardiogram - 2019 EF 50 to 55%, moderate MR.  Normal magnesium, TSH and potassium. BNP elevated at 599, no obvious volume overload. -Start Eliquis - Resume home q24hr metoprolol- 50 mg twice daily -Obtain updated echo -IV metoprolol 5 mg as needed -IV Lasix 20 mg x 1 given, further dosing pending clinical course.  Chest pain-likely secondary to symptomatic atrial fibrillation.  Troponins flat at 12.  CTA chest negative for PE, this bilateral pleural effusions R> L.   Hypertension-stable. -Resume metoprolol - Losartan held for contrast exposure.  Diabetes mellitus-random glucose 136.  Not on medications. - HgbA1c  Old LBBB, ventricular tachycardia -Resume beta-blocker  Thrombocytosis-platelets today 269, stable.  Follows with Dr. Delton Coombes. -Resume hydroxyurea.  DVT prophylaxis: Eliquis Code Status: Full Code. Family Communication: Daughter at bedside Disposition Plan:  ~ 1 - 2 days Consults called: None Admission status: Obs tele  Bethena Roys MD [facility]  02/28/2021, 8:42 PM

## 2021-02-28 NOTE — ED Triage Notes (Signed)
C/o shortness of breath onset last night, heart irregular in triage

## 2021-02-28 NOTE — Progress Notes (Signed)
ANTICOAGULATION CONSULT NOTE - Initial Consult  Pharmacy Consult for Apixaban Indication: atrial fibrillation  Allergies  Allergen Reactions  . Codeine Nausea And Vomiting  . Morphine Other (See Comments)    GI symptoms    Patient Measurements: Height: 5\' 3"  (160 cm) Weight: 81.2 kg (179 lb 1.6 oz) IBW/kg (Calculated) : 52.4   Vital Signs: Temp: 98.2 F (36.8 C) (05/02 1748) Temp Source: Oral (05/02 1748) BP: 133/91 (05/02 1748) Pulse Rate: 105 (05/02 1748)  Labs: Recent Labs    02/28/21 1149 02/28/21 1213 02/28/21 1357  HGB 11.5*  --   --   HCT 33.0*  --   --   PLT 269  --   --   CREATININE  --  0.77  --   TROPONINIHS 12  --  12    Estimated Creatinine Clearance: 62.2 mL/min (by C-G formula based on SCr of 0.77 mg/dL).   Medical History: Past Medical History:  Diagnosis Date  . Anxiety   . Atrial septal defect    Surgical repair in 1994  . Chest pain    Normal coronary angiography in 1994 and 2011  . Dizziness   . Endometrial cancer, grade I ([place]) 10/09/2019  . Essential thrombocythemia ([place]) 06/06/2016  . GERD (gastroesophageal reflux disease)   . History of kidney stones   . Hyperlipidemia   . Hypertension   . LBBB (left bundle branch block) 2011  . Palpitations   . PONV (postoperative nausea and vomiting)   . Syncope   . Type 2 diabetes mellitus with other specified complication ([place]) 12/21/9796  . Ventricular tachycardia ([name])    Exercise induced   Assessment: 75 yo female with new onset atrial fibrillation. Pharmacy cosulted to start apixaban. Patient <80 yo, Weight >60kg, and Scr <1.5. Will start at full dose. No AC PTA noted. No drug-drug interactions noted. Patient to be educated prior to discharge.   Goal of Therapy:  Prevention of stroke Monitor platelets by anticoagulation protocol: Yes   Plan:  Apixaban 5mg  PO BID Monitor for bleeding Would suggest TOC consult to verify insurance coverage of apixaban.   Tharon Bomar A. Levada Dy, PharmD,  BCPS, FNKF Clinical Pharmacist [place] Please utilize Amion for appropriate phone number to reach the unit pharmacist ([place])    Theotis Burrow 02/28/2021,8:55 PM

## 2021-02-28 NOTE — Discharge Instructions (Signed)

## 2021-02-28 NOTE — ED Provider Notes (Signed)
[facility] EMERGENCY DEPARTMENT Provider Note   CSN: [id number] Arrival date & time: 02/28/21  1034     History Chief Complaint  Patient presents with  . Shortness of Breath    Heather Barber is a 75 y.o. female with pertinent past medical history of hypertension, hyperlipidemia, type 2 diabetes, stage I endometrial adenocarcinoma not currently being treated that presents to the emergency department today for shortness of breath and chest pain that started last night.  Patient states that for the past week she has been feeling slightly more short of breath, admits to orthopnea for the past week.  Patient states that she infrequently has shortness of breath, happens a couple times a year, however this time it has been the worst.  Has never been diagnosed with A. fib, has never been diagnosed with CHF.  Denies any leg swelling or recent weight gain that she is aware of, does take Lasix.  States that she has been compliant with this.  Chest pain is constant, describes it more as a pressure, does not radiate anywhere, is not worse with exertion.  Also admits to some palpitations.  States that shortness of breath is worse with exertion.  Denies any prior cardiac history.  Denies any recent viral illness, cough, URI symptoms, fevers, nausea, vomiting, abdominal pain, recent sick contacts.  Per chart review patient had an echo done in 2019 which showed EF of 50 to 55% with moderate mitral valve regurg.  Patient is not on any anticoagulants.  HPI     Past Medical History:  Diagnosis Date  . Anxiety   . Atrial septal defect    Surgical repair in 1994  . Chest pain    Normal coronary angiography in 1994 and 2011  . Dizziness   . Endometrial cancer, grade I ([place]) 10/09/2019  . Essential thrombocythemia ([place]) 06/06/2016  . GERD (gastroesophageal reflux disease)   . History of kidney stones   . Hyperlipidemia   . Hypertension   . LBBB (left bundle branch block) 2011  . Palpitations   . PONV  (postoperative nausea and vomiting)   . Syncope   . Type 2 diabetes mellitus with other specified complication ([place]) 02/05/8118  . Ventricular tachycardia [facility])    Exercise induced    Patient Active Problem List   Diagnosis Date Noted  . New onset atrial fibrillation ([place]) 02/28/2021  . Hypertriglyceridemia 09/16/2020  . Acute postoperative respiratory failure ([place]) 12/10/2019  . Endometrial cancer ([place]) 12/09/2019  . Endometrial cancer, grade I ([place]) 10/09/2019  . PMB (postmenopausal bleeding) 10/09/2019  . Type 2 diabetes mellitus with other specified complication ([place]) 14/78/2956  . SVT (supraventricular tachycardia) ([place]) 05/21/2019  . Chest pain 12/07/2017  . Gastroesophageal reflux disease 07/24/2016  . Thrombocytosis 06/06/2016  . Breast thickening right , with negative mammogram. 09/27/2015  . LBBB (left bundle branch block)   . Ventricular tachycardia ([place])   . ASD (atrial septal defect)   . Hypertension   . Hyperlipidemia 04/27/2010    Past Surgical History:  Procedure Laterality Date  . APPENDECTOMY    . ASD [place] EXCISIONAL BIOPSY     Right and left  . BREAST SURGERY     right and left breast-benign  . CARDIAC CATHETERIZATION  12/24/2009   [name]   . CATARACT EXTRACTION W/PHACO Left 03/11/2013   Procedure: CATARACT EXTRACTION PHACO AND INTRAOCULAR LENS PLACEMENT (IOC);  Surgeon: [name], MD;  Location: [facility]  ORS;  Service: Ophthalmology;  Laterality: Left;  CDE:  12.20  . HYSTEROSCOPY WITH D & C N/A 11/11/2019   Procedure: DILATATION AND CURETTAGE /HYSTEROSCOPY;  Surgeon: [name], MD;  Location: [facility] ORS;  Service: Gynecology;  Laterality: N/A;  . POLYPECTOMY N/A 11/11/2019   Procedure: POLYPECTOMY(ENDOMETRIAL POLYP);  Surgeon: [name], MD;  Location: [facility] ORS;  Service: Gynecology;  Laterality: N/A;  . ROBOTIC ASSISTED LAPAROSCOPIC HYSTERECTOMY AND SALPINGECTOMY Bilateral 12/09/2019   Procedure: XI  ROBOTIC ASSISTED LAPAROSCOPIC HYSTERECTOMY BILATERAL SALPINGOOOPHORECTOMY;  Surgeon: [name], MD;  Location: [facility] ORS;  Service: Gynecology;  Laterality: Bilateral;  . SENTINEL NODE BIOPSY N/A 12/09/2019   Procedure: SENTINEL LYMPH  NODE BIOPSY;  Surgeon: [name], MD;  Location: [facility] ORS;  Service: Gynecology;  Laterality: N/A;  . TONSILLECTOMY    . TUBAL LIGATION     Bilateral     OB History    Gravida  4   Para  4   Term  4   Preterm      AB      Living  3     SAB      IAB      Ectopic      Multiple      Live Births              Family History  Problem Relation Age of Onset  . Cancer Mother        female  . Stroke Sister   . Cancer Sister        breast  . Heart failure Father   . Coronary artery disease Neg Hx     Social History   Tobacco Use  . Smoking status: Never Smoker  . Smokeless tobacco: Never Used  Vaping Use  . Vaping Use: Never used  Substance Use Topics  . Alcohol use: No  . Drug use: No    Home Medications Prior to Admission medications   Medication Sig Start Date End Date Taking? Authorizing Provider  aspirin EC 81 MG tablet Take 81 mg by mouth daily.   Yes [provider]  atorvastatin (LIPITOR) 20 MG tablet Take 1 tablet (20 mg total) by mouth at bedtime. 09/16/20  Yes Dettinger, Fransisca Kaufmann, MD  fenofibrate (TRICOR) 145 MG tablet Take 1 tablet (145 mg total) by mouth daily. 09/16/20  Yes Dettinger, Fransisca Kaufmann, MD  furosemide (LASIX) 20 MG tablet TAKE 1 TABLET BY MOUTH EVERY DAY 01/31/21  Yes Branch, Alphonse Guild, MD  hydroxyurea (HYDREA) 500 MG capsule TAKE 1 CAPSULE BY MOUTH TWICE A DAY 11/17/20  Yes Derek Jack, MD  ibuprofen (ADVIL) 600 MG tablet Take 1 tablet (600 mg total) by mouth every 6 (six) hours as needed for moderate pain. For AFTER surgery, use sparingly Patient taking differently: Take 600 mg by mouth as needed for moderate pain. For AFTER surgery, use sparingly 11/18/19  Yes Cross, Melissa D, NP   ketoconazole (NIZORAL) 2 % cream Apply 1 application topically 2 (two) times daily. Patient taking differently: Apply 1 application topically as needed. 06/16/20  Yes Dettinger, Fransisca Kaufmann, MD  losartan (COZAAR) 50 MG tablet Take 1 tablet (50 mg total) by mouth daily. 09/16/20  Yes Dettinger, Fransisca Kaufmann, MD  metoprolol succinate (TOPROL-XL) 50 MG 24 hr tablet TAKE 1 TABLET (50 MG TOTAL) BY MOUTH 2 (TWO) TIMES A DAY. TAKE WITH OR IMMEDIATELY FOLLOWING A MEAL. 05/07/20  Yes Branch, Alphonse Guild, MD  Multiple Vitamins-Minerals (MULTIVITAMIN WOMEN 50+) TABS Take by  mouth daily.   Yes [provider]  naproxen sodium (ALEVE) 220 MG tablet Take 220 mg by mouth as needed.   Yes [provider]  omeprazole (PRILOSEC) 20 MG capsule Take 1 capsule (20 mg total) by mouth daily. 09/16/20  Yes Dettinger, Fransisca Kaufmann, MD    Allergies    Codeine and Morphine  Review of Systems   Review of Systems  Constitutional: Negative for chills, diaphoresis, fatigue and fever.  HENT: Negative for congestion, sore throat and trouble swallowing.   Eyes: Negative for pain and visual disturbance.  Respiratory: Positive for chest tightness and shortness of breath. Negative for cough and wheezing.   Cardiovascular: Positive for palpitations. Negative for chest pain and leg swelling.  Gastrointestinal: Negative for abdominal distention, abdominal pain, diarrhea, nausea and vomiting.  Genitourinary: Negative for difficulty urinating.  Musculoskeletal: Negative for back pain, neck pain and neck stiffness.  Skin: Negative for pallor.  Neurological: Negative for dizziness, speech difficulty, weakness and headaches.  Psychiatric/Behavioral: Negative for confusion.    Physical Exam Updated Vital Signs BP (!) 114/98   Pulse (!) 121   Temp 98.4 F (36.9 C) (Oral)   Resp (!) 27   Ht 5\' 3"  (1.6 m)   Wt 81.2 kg   SpO2 97%   BMI 31.73 kg/m   Physical Exam Constitutional:      General: She is not in acute  distress.    Appearance: Normal appearance. She is not ill-appearing, toxic-appearing or diaphoretic.  HENT:     Mouth/Throat:     Mouth: Mucous membranes are moist.     Pharynx: Oropharynx is clear.  Eyes:     General: No scleral icterus.    Extraocular Movements: Extraocular movements intact.     Pupils: Pupils are equal, round, and reactive to light.  Cardiovascular:     Rate and Rhythm: Tachycardia present. Rhythm irregular.     Pulses: Normal pulses.     Heart sounds: Normal heart sounds.  Pulmonary:     Effort: Pulmonary effort is normal. Tachypnea present. No respiratory distress.     Breath sounds: Normal breath sounds. No stridor. No wheezing, rhonchi or rales.  Chest:     Chest wall: No tenderness.  Abdominal:     General: Abdomen is flat. There is no distension.     Palpations: Abdomen is soft.     Tenderness: There is no abdominal tenderness. There is no guarding or rebound.  Musculoskeletal:        General: No swelling or tenderness. Normal range of motion.     Cervical back: Normal range of motion and neck supple. No rigidity.     Right lower leg: No edema.     Left lower leg: No edema.  Skin:    General: Skin is warm and dry.     Capillary Refill: Capillary refill takes less than 2 seconds.     Coloration: Skin is not pale.  Neurological:     General: No focal deficit present.     Mental Status: She is alert and oriented to person, place, and time.  Psychiatric:        Mood and Affect: Mood normal.        Behavior: Behavior normal.     ED Results / Procedures / Treatments   Labs (all labs ordered are listed, but only abnormal results are displayed) Labs Reviewed  CBC - Abnormal; Notable for the following components:      Result Value   RBC 2.73 (*)  Hemoglobin 11.5 (*)    HCT 33.0 (*)    MCV 120.9 (*)    MCH 42.1 (*)    All other components within normal limits  BRAIN NATRIURETIC PEPTIDE - Abnormal; Notable for the following components:   B  Natriuretic Peptide 599.0 (*)    All other components within normal limits  COMPREHENSIVE METABOLIC PANEL - Abnormal; Notable for the following components:   Glucose, Bld 136 (*)    Alkaline Phosphatase 130 (*)    All other components within normal limits  RESP PANEL BY RT-PCR (FLU A&B, COVID) ARPGX2  MAGNESIUM  TSH  TROPONIN I (HIGH SENSITIVITY)  TROPONIN I (HIGH SENSITIVITY)    EKG EKG Interpretation  Date/Time:  Monday Feb 28 2021 12:05:52 EDT Ventricular Rate:  107 PR Interval:    QRS Duration: 146 QT Interval:  380 QTC Calculation: 507 R Axis:   111 Text Interpretation: Atrial fibrillation vs Artifact Nonspecific intraventricular conduction delay Minimal ST depression, inferior leads previous LBBB no AFib Confirmed by Fredia Sorrow [phone]) on 02/28/2021 12:09:34 PM   Radiology DG Chest 2 View  Result Date: 02/28/2021 CLINICAL DATA:  Short of breath EXAM: CHEST - 2 VIEW COMPARISON:  12/17/2017 FINDINGS: Sternotomy wires overlie normal cardiac silhouette. Lungs are hyperinflated. There is peripheral interstitial linear thickening. Small pleural effusions. No focal consolidation. On the lateral projection, there is a new density of the posterior aspect of the T11 vertebral body IMPRESSION: 1. Interstitial edema pattern and small effusions. 2. Hyperinflated lungs. 3. New posterior lower lobe density on the lateral projection. Recommend CT of the thorax for further evaluation Electronically Signed   By: Suzy Bouchard M.D.   On: 02/28/2021 12:35   CT Angio Chest PE W/Cm &/Or Wo Cm  Result Date: 02/28/2021 CLINICAL DATA:  Chest pressure since last night, arrhythmia EXAM: CT ANGIOGRAPHY CHEST WITH CONTRAST TECHNIQUE: Multidetector CT imaging of the chest was performed using the standard protocol during bolus administration of intravenous contrast. Multiplanar CT image reconstructions and MIPs were obtained to evaluate the vascular anatomy. CONTRAST:  148mL OMNIPAQUE IOHEXOL 350 MG/ML  SOLN COMPARISON:  02/28/2021 FINDINGS: Cardiovascular: This is a technically adequate evaluation of the pulmonary vasculature. No filling defects or pulmonary emboli. Heart is enlarged without pericardial effusion. Reflux of contrast into the hepatic veins suggests cardiac dysfunction. No evidence of thoracic aortic aneurysm or dissection. Mild atherosclerosis. Mediastinum/Nodes: No enlarged mediastinal, hilar, or axillary lymph nodes. Thyroid gland, trachea, and esophagus demonstrate no significant findings. Lungs/Pleura: There are trace bilateral pleural effusions, right greater than left. No airspace disease or pneumothorax. The central airways are patent. Upper Abdomen: No acute abnormality. Musculoskeletal: No acute or destructive bony lesions. Reconstructed images demonstrate no additional findings. Review of the MIP images confirms the above findings. IMPRESSION: 1. No evidence of pulmonary embolus. 2. Cardiomegaly, with reflux of contrast into the hepatic veins suggesting cardiac dysfunction. 3. Trace bilateral pleural effusions, right greater than left. 4.  Aortic Atherosclerosis (ICD10-I70.0). Electronically Signed   By: Randa Ngo M.D.   On: 02/28/2021 15:55    Procedures Procedures   Medications Ordered in ED Medications  metoprolol succinate (TOPROL-XL) 24 hr tablet 50 mg (has no administration in time range)  metoprolol tartrate (LOPRESSOR) injection 5 mg (has no administration in time range)  furosemide (LASIX) injection 20 mg (20 mg Intravenous Given 02/28/21 1324)  iohexol (OMNIPAQUE) 350 MG/ML injection 100 mL (100 mLs Intravenous Contrast Given 02/28/21 1533)    ED Course  I have reviewed the triage vital signs  and the nursing notes.  Pertinent labs & imaging results that were available during my care of the patient were reviewed by me and considered in my medical decision making (see chart for details).    MDM Rules/Calculators/A&P                          Heather Barber is  a 75 y.o. female with pertinent past medical history of hypertension, hyperlipidemia, type 2 diabetes, stage I endometrial adenocarcinoma not currently being treated that presents to the emergency department today for shortness of breath and chest pain that started last night.  Patient is not in any acute distress, heart rate is irregular, pulse around 110.  Hemodynamically stable.  EKG appears to be in A. Fib, patient does not have a history of this.  Differential for new onset of A. fib to include acute CHF due to orthopnea.  Work-up today shows BNP of 599, patient states that she is never been diagnosed with CHF before.  Otherwise CBC and CMP unremarkable.  Troponin 12, second troponin 12.  TSH normal and magnesium normal.  CT does not show any evidence of PE, does show pleural effusions and cardiomegaly, suggesting CHF.  On reevaluation patient states that she feels better with Lasix, however still short of breath with some chest tightness centrally.  Patient's pulse ranges from 70-120, appears to still be in A. fib, no RVR.  Patient to be admitted at this time for new onset of A. fib secondary to CHF exacerbation.  Patient agreeable with plan.  Patient admitted to Dr. Denton Brick.  The patient appears reasonably stabilized for admission considering the current resources, flow, and capabilities available in the ED at this time, and I doubt any other [facility] requiring further screening and/or treatment in the ED prior to admission.  The patient appears reasonably stabilized for admission considering the current resources, flow, and capabilities available in the ED at this time, and I doubt any other [facility] requiring further screening and/or treatment in the ED prior to admission.  Final Clinical Impression(s) / ED Diagnoses Final diagnoses:  Atrial fibrillation, new onset ([place])  Acute congestive heart failure, unspecified heart failure type [facility])    Rx / DC Orders ED Discharge Orders    None        Alfredia Client, PA-C 02/28/21 1719    Fredia Sorrow, MD 03/01/21 [phone]

## 2021-03-01 ENCOUNTER — Observation Stay (HOSPITAL_COMMUNITY): Payer: Medicare Other

## 2021-03-01 DIAGNOSIS — Z8249 Family history of ischemic heart disease and other diseases of the circulatory system: Secondary | ICD-10-CM | POA: Diagnosis not present

## 2021-03-01 DIAGNOSIS — I472 Ventricular tachycardia: Secondary | ICD-10-CM | POA: Diagnosis present

## 2021-03-01 DIAGNOSIS — I4891 Unspecified atrial fibrillation: Secondary | ICD-10-CM | POA: Diagnosis not present

## 2021-03-01 DIAGNOSIS — I5021 Acute systolic (congestive) heart failure: Secondary | ICD-10-CM | POA: Diagnosis not present

## 2021-03-01 DIAGNOSIS — I361 Nonrheumatic tricuspid (valve) insufficiency: Secondary | ICD-10-CM | POA: Diagnosis not present

## 2021-03-01 DIAGNOSIS — I5023 Acute on chronic systolic (congestive) heart failure: Secondary | ICD-10-CM | POA: Diagnosis not present

## 2021-03-01 DIAGNOSIS — Z885 Allergy status to narcotic agent status: Secondary | ICD-10-CM | POA: Diagnosis not present

## 2021-03-01 DIAGNOSIS — Z7982 Long term (current) use of aspirin: Secondary | ICD-10-CM | POA: Diagnosis not present

## 2021-03-01 DIAGNOSIS — I088 Other rheumatic multiple valve diseases: Secondary | ICD-10-CM | POA: Diagnosis not present

## 2021-03-01 DIAGNOSIS — E1169 Type 2 diabetes mellitus with other specified complication: Secondary | ICD-10-CM | POA: Diagnosis not present

## 2021-03-01 DIAGNOSIS — Z90722 Acquired absence of ovaries, bilateral: Secondary | ICD-10-CM | POA: Diagnosis not present

## 2021-03-01 DIAGNOSIS — K219 Gastro-esophageal reflux disease without esophagitis: Secondary | ICD-10-CM | POA: Diagnosis present

## 2021-03-01 DIAGNOSIS — Z87442 Personal history of urinary calculi: Secondary | ICD-10-CM | POA: Diagnosis not present

## 2021-03-01 DIAGNOSIS — I10 Essential (primary) hypertension: Secondary | ICD-10-CM | POA: Diagnosis not present

## 2021-03-01 DIAGNOSIS — Z8542 Personal history of malignant neoplasm of other parts of uterus: Secondary | ICD-10-CM | POA: Diagnosis not present

## 2021-03-01 DIAGNOSIS — D75839 Thrombocytosis, unspecified: Secondary | ICD-10-CM | POA: Diagnosis not present

## 2021-03-01 DIAGNOSIS — I110 Hypertensive heart disease with heart failure: Secondary | ICD-10-CM | POA: Diagnosis not present

## 2021-03-01 DIAGNOSIS — Z9842 Cataract extraction status, left eye: Secondary | ICD-10-CM | POA: Diagnosis not present

## 2021-03-01 DIAGNOSIS — I4892 Unspecified atrial flutter: Secondary | ICD-10-CM | POA: Diagnosis not present

## 2021-03-01 DIAGNOSIS — I447 Left bundle-branch block, unspecified: Secondary | ICD-10-CM | POA: Diagnosis not present

## 2021-03-01 DIAGNOSIS — Z961 Presence of intraocular lens: Secondary | ICD-10-CM | POA: Diagnosis present

## 2021-03-01 DIAGNOSIS — I429 Cardiomyopathy, unspecified: Secondary | ICD-10-CM | POA: Diagnosis present

## 2021-03-01 DIAGNOSIS — I340 Nonrheumatic mitral (valve) insufficiency: Secondary | ICD-10-CM | POA: Diagnosis not present

## 2021-03-01 DIAGNOSIS — Z79899 Other long term (current) drug therapy: Secondary | ICD-10-CM | POA: Diagnosis not present

## 2021-03-01 DIAGNOSIS — Z823 Family history of stroke: Secondary | ICD-10-CM | POA: Diagnosis not present

## 2021-03-01 DIAGNOSIS — Z90710 Acquired absence of both cervix and uterus: Secondary | ICD-10-CM | POA: Diagnosis not present

## 2021-03-01 DIAGNOSIS — I440 Atrioventricular block, first degree: Secondary | ICD-10-CM | POA: Diagnosis not present

## 2021-03-01 DIAGNOSIS — Z20822 Contact with and (suspected) exposure to covid-19: Secondary | ICD-10-CM | POA: Diagnosis not present

## 2021-03-01 LAB — CBC
HCT: 31.2 % — ABNORMAL LOW (ref 36.0–46.0)
Hemoglobin: 10.7 g/dL — ABNORMAL LOW (ref 12.0–15.0)
MCH: 41.5 pg — ABNORMAL HIGH (ref 26.0–34.0)
MCHC: 34.3 g/dL (ref 30.0–36.0)
MCV: 120.9 fL — ABNORMAL HIGH (ref 80.0–100.0)
Platelets: 248 10*3/uL (ref 150–400)
RBC: 2.58 MIL/uL — ABNORMAL LOW (ref 3.87–5.11)
RDW: 13.2 % (ref 11.5–15.5)
WBC: 5.3 10*3/uL (ref 4.0–10.5)
nRBC: 0 % (ref 0.0–0.2)

## 2021-03-01 LAB — ECHOCARDIOGRAM COMPLETE
AR max vel: 1.1 cm2
AV Area VTI: 1.3 cm2
AV Area mean vel: 1.15 cm2
AV Mean grad: 3.5 mmHg
AV Peak grad: 8.1 mmHg
Ao pk vel: 1.42 m/s
Area-P 1/2: 6.32 cm2
Height: 63 in
MV M vel: 5.23 m/s
MV Peak grad: 109.4 mmHg
Radius: 0.5 cm
S' Lateral: 4.1 cm
Weight: 2716.07 oz

## 2021-03-01 LAB — BASIC METABOLIC PANEL
Anion gap: 8 (ref 5–15)
BUN: 17 mg/dL (ref 8–23)
CO2: 28 mmol/L (ref 22–32)
Calcium: 8.9 mg/dL (ref 8.9–10.3)
Chloride: 105 mmol/L (ref 98–111)
Creatinine, Ser: 0.69 mg/dL (ref 0.44–1.00)
GFR, Estimated: >60 mL/min (ref >60)
Glucose, Bld: 125 mg/dL — ABNORMAL HIGH (ref 70–99)
Potassium: 3.6 mmol/L (ref 3.5–5.1)
Sodium: 141 mmol/L (ref 135–145)

## 2021-03-01 MED ORDER — METOPROLOL TARTRATE 5 MG/5ML IV SOLN
5.0000 mg | Freq: Four times a day (QID) | INTRAVENOUS | Status: DC | PRN
  Administered 2021-03-01 – 2021-03-03 (×2): 5 mg via INTRAVENOUS
  Filled 2021-03-01 (×2): qty 5

## 2021-03-01 MED ORDER — METOPROLOL SUCCINATE ER 50 MG PO TB24
75.0000 mg | ORAL_TABLET | Freq: Two times a day (BID) | ORAL | Status: DC
  Administered 2021-03-01 – 2021-03-02 (×2): 75 mg via ORAL
  Filled 2021-03-01 (×2): qty 1

## 2021-03-01 NOTE — Progress Notes (Signed)
Alert and oriented x 4. Denies pain or discomfort at this time. Independent in room with all adls. settling well in room. Shown how to use remote and verbalizes understanding. Continue plan of care.

## 2021-03-01 NOTE — Progress Notes (Signed)
*  PRELIMINARY RESULTS* Echocardiogram 2D Echocardiogram has been performed.  Heather Barber 03/01/2021, 4:22 PM

## 2021-03-01 NOTE — Progress Notes (Signed)
   03/01/21 1332  Assess: MEWS Score  Temp 98.2 F (36.8 C)  BP 125/80  Pulse Rate (!) 114  Resp 16  SpO2 96 %  Assess: MEWS Score  MEWS Temp 0  MEWS Systolic 0  MEWS Pulse 2  MEWS RR 0  MEWS LOC 0  MEWS Score 2  MEWS Score Color Yellow  Assess: if the MEWS score is Yellow or Red  Were vital signs taken at a resting state? Yes  Focused Assessment No change from prior assessment  Early Detection of Sepsis Score *See Row Information* Low  MEWS guidelines implemented *See Row Information* Yes  Take Vital Signs  Increase Vital Sign Frequency  Yellow: Q 2hr X 2 then Q 4hr X 2, if remains yellow, continue Q 4hrs  Escalate  MEWS: Escalate Yellow: discuss with charge nurse/RN and consider discussing with provider and RRT  Notify: Charge Nurse/RN  Name of Charge Nurse/RN Notified [email or web address]  Date Charge Nurse/RN Notified 03/01/21  Time Charge Nurse/RN Notified 1500  Notify: Provider  Provider Name/Title Manuella Ghazi  Date Provider Notified 03/01/21  Notification Type Face-to-face  Notification Reason Other (Comment) (yellow MEWS)

## 2021-03-01 NOTE — Progress Notes (Signed)
PROGRESS NOTE    Heather Barber  QJJ:[id number] DOB: [birth year] DOA: 02/28/2021 PCP: Dettinger, Fransisca Kaufmann, MD   Brief Narrative:   Heather Barber is a 75 y.o. female with medical history significant for LBBB, ventricular tachycardia, hypertension, diabetes mellitus, endometrial cancer. Patient presented to the ED with complaints of chest pain and difficulty breathing that started last night when was lying down to sleep.   Assessment & Plan:   Principal Problem:   New onset atrial fibrillation [facility]) Active Problems:   Thrombocytosis   Type 2 diabetes mellitus with other specified complication ([name])   Atrial fibrillation with RVR ([name])  Symptomatic, new onset atrial fibrillation -Started on Eliquis given CHA2DS2-VASc score 4 -Increased home metoprolol from 50 mg twice daily to 75 mg twice daily -Updated echocardiogram pending -TSH 3.423 -No electrolyte abnormalities -Continues to have heart rate elevation that will require ongoing monitoring  Hypertension-stable -Continue current medications with metoprolol dose increased  Diabetes mellitus -Hemoglobin A1c 6.7% on 11/2020  Old LBBB -Continue beta-blocker  Thrombocytosis -Hydroxyurea -Follows with Dr. Delton Coombes   DVT prophylaxis: Eliquis Code Status: Full Family Communication: Discussed with daughter at bedside Disposition Plan:  Status is: Inpatient  Remains inpatient appropriate because:Hemodynamically unstable, Ongoing diagnostic testing needed not appropriate for outpatient work up, IV treatments appropriate due to intensity of illness or inability to take PO and Inpatient level of care appropriate due to severity of illness   Dispo: The patient is from: Home              Anticipated d/c is to: Home              Patient currently is not medically stable to d/c.   Difficult to place patient No  Consultants:   None  Procedures:   See below  Antimicrobials:   None   Subjective: Patient seen and  evaluated today with no new acute complaints or concerns. No acute concerns or events noted overnight. She denies dyspnea or palpitations, or chest pain. Her HR is still elevated and becomes more elevated with ambulation.  Objective: Vitals:   03/01/21 0646 03/01/21 0852 03/01/21 1332 03/01/21 1620  BP: 117/69 109/79 125/80 (!) 142/78  Pulse: 96 (!) 107 (!) 114 (!) 130  Resp: 18  16 18   Temp: 98.2 F (36.8 C)  98.2 F (36.8 C) 98.2 F (36.8 C)  TempSrc: Oral  Oral Oral  SpO2: 93%  96% 95%  Weight:      Height:        Intake/Output Summary (Last 24 hours) at 03/01/2021 1630 Last data filed at 03/01/2021 0900 Gross per 24 hour  Intake 240 ml  Output --  Net 240 ml   Filed Weights   02/28/21 1321 03/01/21 0500  Weight: 81.2 kg 77 kg    Examination:  General exam: Appears calm and comfortable  Respiratory system: Clear to auscultation. Respiratory effort normal. Cardiovascular system: S1 & S2 heard, irregular and tachycardic Gastrointestinal system: Abdomen is soft Central nervous system: Alert and awake Extremities: No edema Skin: No significant lesions noted Psychiatry: Flat affect.    Data Reviewed: I have personally reviewed following labs and imaging studies  CBC: Recent Labs  Lab 02/28/21 1149 03/01/21 0457  WBC 6.0 5.3  HGB 11.5* 10.7*  HCT 33.0* 31.2*  MCV 120.9* 120.9*  PLT 269 481   Basic Metabolic Panel: Recent Labs  Lab 02/28/21 1213 03/01/21 0457  NA 140 141  K 3.8 3.6  CL 105 105  CO2 27  28  GLUCOSE 136* 125*  BUN 18 17  CREATININE 0.77 0.69  CALCIUM 8.9 8.9  MG 1.7  --    GFR: Estimated Creatinine Clearance: 60.6 mL/min (by C-G formula based on SCr of 0.69 mg/dL). Liver Function Tests: Recent Labs  Lab 02/28/21 1213  AST 17  ALT 23  ALKPHOS 130*  BILITOT 0.8  PROT 7.0  ALBUMIN 4.0   No results for input(s): LIPASE, AMYLASE in the last 168 hours. No results for input(s): AMMONIA in the last 168 hours. Coagulation Profile: No  results for input(s): INR, PROTIME in the last 168 hours. Cardiac Enzymes: No results for input(s): CKTOTAL, CKMB, CKMBINDEX, TROPONINI in the last 168 hours. BNP (last 3 results) No results for input(s): PROBNP in the last 8760 hours. HbA1C: No results for input(s): HGBA1C in the last 72 hours. CBG: No results for input(s): GLUCAP in the last 168 hours. Lipid Profile: No results for input(s): CHOL, HDL, LDLCALC, TRIG, CHOLHDL, LDLDIRECT in the last 72 hours. Thyroid Function Tests: Recent Labs    02/28/21 1213  TSH 3.423   Anemia Panel: No results for input(s): VITAMINB12, FOLATE, FERRITIN, TIBC, IRON, RETICCTPCT in the last 72 hours. Sepsis Labs: No results for input(s): PROCALCITON, LATICACIDVEN in the last 168 hours.  Recent Results (from the past 240 hour(s))  Resp Panel by RT-PCR (Flu A&B, Covid) Nasopharyngeal Swab     Status: None   Collection Time: 02/28/21 12:56 PM   Specimen: Nasopharyngeal Swab; Nasopharyngeal(NP) swabs in vial transport medium  Result Value Ref Range Status   SARS Coronavirus 2 by RT PCR NEGATIVE NEGATIVE Final    Comment: (NOTE) SARS-CoV-2 target nucleic acids are NOT DETECTED.  The SARS-CoV-2 RNA is generally detectable in upper respiratory specimens during the acute phase of infection. The lowest concentration of SARS-CoV-2 viral copies this assay can detect is 138 copies/mL. A negative result does not preclude SARS-Cov-2 infection and should not be used as the sole basis for treatment or other patient management decisions. A negative result may occur with  improper specimen collection/handling, submission of specimen other than nasopharyngeal swab, presence of viral mutation(s) within the areas targeted by this assay, and inadequate number of viral copies(<138 copies/mL). A negative result must be combined with clinical observations, patient history, and epidemiological information. The expected result is Negative.  Fact Sheet for  Patients:  EntrepreneurPulse.com.au  Fact Sheet for Healthcare Providers:  IncredibleEmployment.be  This test is no t yet approved or cleared by the [place] FDA and  has been authorized for detection and/or diagnosis of SARS-CoV-2 by FDA under an Emergency Use Authorization (EUA). This EUA will remain  in effect (meaning this test can be used) for the duration of the COVID-19 declaration under Section 564(b)(1) of the Act, 21 U.S.C.section 360bbb-3(b)(1), unless the authorization is terminated  or revoked sooner.       Influenza A by PCR NEGATIVE NEGATIVE Final   Influenza B by PCR NEGATIVE NEGATIVE Final    Comment: (NOTE) The Xpert Xpress SARS-CoV-2/FLU/RSV plus assay is intended as an aid in the diagnosis of influenza from Nasopharyngeal swab specimens and should not be used as a sole basis for treatment. Nasal washings and aspirates are unacceptable for Xpert Xpress SARS-CoV-2/FLU/RSV testing.  Fact Sheet for Patients: EntrepreneurPulse.com.au  Fact Sheet for Healthcare Providers: IncredibleEmployment.be  This test is not yet approved or cleared by the [place] FDA and has been authorized for detection and/or diagnosis of SARS-CoV-2 by FDA under an Emergency Use Authorization (EUA). This  EUA will remain in effect (meaning this test can be used) for the duration of the COVID-19 declaration under Section 564(b)(1) of the Act, 21 U.S.C. section 360bbb-3(b)(1), unless the authorization is terminated or revoked.  Performed at [facility], [address] [place], [place] [address]          Radiology Studies: DG Chest 2 View  Result Date: 02/28/2021 CLINICAL DATA:  Short of breath EXAM: CHEST - 2 VIEW COMPARISON:  12/17/2017 FINDINGS: Sternotomy wires overlie normal cardiac silhouette. Lungs are hyperinflated. There is peripheral interstitial linear thickening. Small pleural effusions. No  focal consolidation. On the lateral projection, there is a new density of the posterior aspect of the T11 vertebral body IMPRESSION: 1. Interstitial edema pattern and small effusions. 2. Hyperinflated lungs. 3. New posterior lower lobe density on the lateral projection. Recommend CT of the thorax for further evaluation Electronically Signed   By: Suzy Bouchard M.D.   On: 02/28/2021 12:35   CT Angio Chest PE W/Cm &/Or Wo Cm  Result Date: 02/28/2021 CLINICAL DATA:  Chest pressure since last night, arrhythmia EXAM: CT ANGIOGRAPHY CHEST WITH CONTRAST TECHNIQUE: Multidetector CT imaging of the chest was performed using the standard protocol during bolus administration of intravenous contrast. Multiplanar CT image reconstructions and MIPs were obtained to evaluate the vascular anatomy. CONTRAST:  127mL OMNIPAQUE IOHEXOL 350 MG/ML SOLN COMPARISON:  02/28/2021 FINDINGS: Cardiovascular: This is a technically adequate evaluation of the pulmonary vasculature. No filling defects or pulmonary emboli. Heart is enlarged without pericardial effusion. Reflux of contrast into the hepatic veins suggests cardiac dysfunction. No evidence of thoracic aortic aneurysm or dissection. Mild atherosclerosis. Mediastinum/Nodes: No enlarged mediastinal, hilar, or axillary lymph nodes. Thyroid gland, trachea, and esophagus demonstrate no significant findings. Lungs/Pleura: There are trace bilateral pleural effusions, right greater than left. No airspace disease or pneumothorax. The central airways are patent. Upper Abdomen: No acute abnormality. Musculoskeletal: No acute or destructive bony lesions. Reconstructed images demonstrate no additional findings. Review of the MIP images confirms the above findings. IMPRESSION: 1. No evidence of pulmonary embolus. 2. Cardiomegaly, with reflux of contrast into the hepatic veins suggesting cardiac dysfunction. 3. Trace bilateral pleural effusions, right greater than left. 4.  Aortic Atherosclerosis  (ICD10-I70.0). Electronically Signed   By: Randa Ngo M.D.   On: 02/28/2021 15:55        Scheduled Meds: . apixaban  5 mg Oral BID  . atorvastatin  20 mg Oral QHS  . fenofibrate  160 mg Oral Daily  . losartan  50 mg Oral Daily  . metoprolol succinate  75 mg Oral BID  . pantoprazole  40 mg Oral Daily    LOS: 0 days    Time spent: 35 minutes    Everton Bertha Darleen Crocker, DO [facility]  If 7PM-7AM, please contact night-coverage [email or web address] 03/01/2021, 4:30 PM

## 2021-03-02 DIAGNOSIS — I5021 Acute systolic (congestive) heart failure: Secondary | ICD-10-CM

## 2021-03-02 DIAGNOSIS — I10 Essential (primary) hypertension: Secondary | ICD-10-CM

## 2021-03-02 LAB — BASIC METABOLIC PANEL
Anion gap: 8 (ref 5–15)
BUN: 16 mg/dL (ref 8–23)
CO2: 28 mmol/L (ref 22–32)
Calcium: 9 mg/dL (ref 8.9–10.3)
Chloride: 103 mmol/L (ref 98–111)
Creatinine, Ser: 0.79 mg/dL (ref 0.44–1.00)
GFR, Estimated: >60 mL/min (ref >60)
Glucose, Bld: 128 mg/dL — ABNORMAL HIGH (ref 70–99)
Potassium: 3.9 mmol/L (ref 3.5–5.1)
Sodium: 139 mmol/L (ref 135–145)

## 2021-03-02 LAB — MAGNESIUM: Magnesium: 1.7 mg/dL (ref 1.7–2.4)

## 2021-03-02 MED ORDER — HYDROXYUREA 500 MG PO CAPS
500.0000 mg | ORAL_CAPSULE | Freq: Two times a day (BID) | ORAL | Status: DC
  Administered 2021-03-02 – 2021-03-05 (×7): 500 mg via ORAL
  Filled 2021-03-02 (×11): qty 1

## 2021-03-02 MED ORDER — METOPROLOL SUCCINATE ER 50 MG PO TB24
100.0000 mg | ORAL_TABLET | Freq: Two times a day (BID) | ORAL | Status: DC
  Administered 2021-03-02 – 2021-03-05 (×6): 100 mg via ORAL
  Filled 2021-03-02 (×6): qty 2

## 2021-03-02 MED ORDER — METOPROLOL SUCCINATE ER 25 MG PO TB24
25.0000 mg | ORAL_TABLET | Freq: Once | ORAL | Status: AC
  Administered 2021-03-02: 25 mg via ORAL
  Filled 2021-03-02: qty 1

## 2021-03-02 MED ORDER — FUROSEMIDE 10 MG/ML IJ SOLN
40.0000 mg | Freq: Once | INTRAMUSCULAR | Status: AC
  Administered 2021-03-02: 40 mg via INTRAVENOUS
  Filled 2021-03-02: qty 4

## 2021-03-02 MED ORDER — LOSARTAN POTASSIUM 25 MG PO TABS
25.0000 mg | ORAL_TABLET | Freq: Every day | ORAL | Status: DC
  Administered 2021-03-02 – 2021-03-05 (×4): 25 mg via ORAL
  Filled 2021-03-02 (×3): qty 1

## 2021-03-02 NOTE — Progress Notes (Signed)
Alert and oriented x 4. Denies pain or discomfort. Independent with adls. Continues on telemetry monitoring. A-fib noted still. medication given as ordered. Will continue to monitor.

## 2021-03-02 NOTE — Consult Note (Addendum)
Cardiology Consultation:   [name] MRN: [id number]; DOB: 04/12/1946  Admit date: 02/28/2021 Date of Consult: 03/02/2021  PCP:  Heather Barber, Heather Kaufmann, MD   [facility] Providers Cardiologist:  Heather Dolly, MD   {   Patient Profile:   Heather Barber is a 75 y.o. female with past medical history of palpitations (prior monitor showing SVT), reported history of VT by prior notes and normal cors by cath in 1994 and 2011), LBBB, ASD (s/p repair in 1994), HTN, HLD and history of endometrial cancer who is being seen today for the evaluation of new cardiomyopathy and atrial fibrillation at the request of Dr. Manuella Barber.  History of Present Illness:   [name] most recently had a telehealth visit with Dr. Harl Barber in 10/2019 and denied any recent palpitations at that time. She was continued on her current cardiac medications including ASA 81 mg daily, Atorvastatin 20 mg daily, Lasix 20 mg daily, Losartan 50 mg daily and Toprol-XL 50 mg twice daily.  She presented to [facility] ED on 02/28/2021 for evaluation of chest discomfort and shortness of breath which had started the night prior to arrival. She reports a history of palpitations since she was a child and worsening dyspnea on exertion for the past year but feels like her symptoms worsened over the past 3-4 weeks. The night prior to admission, she developed orthopnea and could not sleep, therefore she came to the ED for evaluation.   She was found to be in new onset atrial fibrillation with RVR while in the ED and was restarted on her home Toprol-XL 6m BID with Eliquis 543mBID being initiated. EKG showed atrial fibrillation, HR 107 with known LBBB but does appear to intermittent p-waves. Labs showed WBC 6.0, Hgb 11.5, platelets 269, Na+ 140, K+ 3.8, creatinine 0.77 and alk phos 130. BNP elevated to 599. Initial and repeat Hs Troponin values flat at 12. TSH 3.423. CXR showed interstitial edema pattern and small effusions along with a  new posterior lower lobe density on the lateral projection with CT recommended for further evaluation. CTA showed no evidence of a PE. Did show cardiomegaly and reflux of contrast into hepatic veins suggesting cardiac dysfunction along with trace bilateral pleural effusions.   Toprol-XL was titrated to 7557mID and rates improved (received 1st dose evening of 5/3), therefore she was going to be discharged yesterday pending her echo results. Her echo did result and showed a reduced EF of 30-35% with global HK and mild LVH. RV function was low-normal and she had moderate biatrial dilation, moderate MR and moderate to severe TR.   She reports much improvement in her symptoms at this time. Still with orthopnea overnight. She did ambulate down the hallway with family yesterday prior to dose titration of Toprol-XL and HR went into the 140's.    Past Medical History:  Diagnosis Date  . Anxiety   . Atrial septal defect    Surgical repair in 1994  . Chest pain    Normal coronary angiography in 1994 and 2011  . Dizziness   . Endometrial cancer, grade I ([place]  . Essential thrombocythemia ([place]/05/2016  . GERD (gastroesophageal reflux disease)   . History of kidney stones   . Hyperlipidemia   . Hypertension   . LBBB (left bundle Heather Barber block) 2011  . Palpitations   . PONV (postoperative nausea and vomiting)   . Syncope   . Type 2 diabetes mellitus with other specified complication ([place]/10/22/3611  Ventricular tachycardia ([name])    Exercise induced    Past Surgical History:  Procedure Laterality Date  . APPENDECTOMY    . ASD REPAIR  1994    Hospital  . BREAST EXCISIONAL BIOPSY     Right and left  . BREAST SURGERY     right and left breast-benign  . CARDIAC CATHETERIZATION  12/24/2009   Dr. Cooper   . CATARACT EXTRACTION W/PHACO Left 03/11/2013   Procedure: CATARACT EXTRACTION PHACO AND INTRAOCULAR LENS PLACEMENT (IOC);  Surgeon: Heather T. Shapiro, MD;  Location: [facility]  ORS;  Service: Ophthalmology;  Laterality: Left;  CDE:  12.20  . HYSTEROSCOPY WITH D & C N/A 11/11/2019   Procedure: DILATATION AND CURETTAGE /HYSTEROSCOPY;  Surgeon: Barber, Heather V, MD;  Location: [facility] ORS;  Service: Gynecology;  Laterality: N/A;  . POLYPECTOMY N/A 11/11/2019   Procedure: POLYPECTOMY(ENDOMETRIAL POLYP);  Surgeon: Barber, Heather V, MD;  Location: [facility] ORS;  Service: Gynecology;  Laterality: N/A;  . ROBOTIC ASSISTED LAPAROSCOPIC HYSTERECTOMY AND SALPINGECTOMY Bilateral 12/09/2019   Procedure: XI ROBOTIC ASSISTED LAPAROSCOPIC HYSTERECTOMY BILATERAL SALPINGOOOPHORECTOMY;  Surgeon: Heather Barber, Emma, MD;  Location: [facility] ORS;  Service: Gynecology;  Laterality: Bilateral;  . SENTINEL NODE BIOPSY N/A 12/09/2019   Procedure: SENTINEL LYMPH  NODE BIOPSY;  Surgeon: Heather Barber, Emma, MD;  Location: [facility] ORS;  Service: Gynecology;  Laterality: N/A;  . TONSILLECTOMY    . TUBAL LIGATION     Bilateral     Home Medications:  Prior to Admission medications   Medication Sig Start Date End Date Taking? Authorizing Provider  aspirin EC 81 MG tablet Take 81 mg by mouth daily.   Yes [provider]  atorvastatin (LIPITOR) 20 MG tablet Take 1 tablet (20 mg total) by mouth at bedtime. 09/16/20  Yes Heather Barber, Heather A, MD  fenofibrate (TRICOR) 145 MG tablet Take 1 tablet (145 mg total) by mouth daily. 09/16/20  Yes Heather Barber, Heather A, MD  furosemide (LASIX) 20 MG tablet TAKE 1 TABLET BY MOUTH EVERY DAY 01/31/21  Yes Barber, Heather F, MD  hydroxyurea (HYDREA) 500 MG capsule TAKE 1 CAPSULE BY MOUTH TWICE A DAY 11/17/20  Yes Heather Barber, Sreedhar, MD  ibuprofen (ADVIL) 600 MG tablet Take 1 tablet (600 mg total) by mouth every 6 (six) hours as needed for moderate pain. For AFTER surgery, use sparingly Patient taking differently: Take 600 mg by mouth as needed for moderate pain. For AFTER surgery, use sparingly 11/18/19  Yes Cross, Melissa D, NP  ketoconazole (NIZORAL) 2 % cream Apply 1 application topically 2 (two)  times daily. Patient taking differently: Apply 1 application topically as needed. 06/16/20  Yes Heather Barber, Heather A, MD  losartan (COZAAR) 50 MG tablet Take 1 tablet (50 mg total) by mouth daily. 09/16/20  Yes Heather Barber, Heather A, MD  metoprolol succinate (TOPROL-XL) 50 MG 24 hr tablet TAKE 1 TABLET (50 MG TOTAL) BY MOUTH 2 (TWO) TIMES A DAY. TAKE WITH OR IMMEDIATELY FOLLOWING A MEAL. 05/07/20  Yes Barber, Heather F, MD  Multiple Vitamins-Minerals (MULTIVITAMIN WOMEN 50+) TABS Take by mouth daily.   Yes [provider]  naproxen sodium (ALEVE) 220 MG tablet Take 220 mg by mouth as needed.   Yes [provider]  omeprazole (PRILOSEC) 20 MG capsule Take 1 capsule (20 mg total) by mouth daily. 09/16/20  Yes Heather Barber, Heather A, MD    Inpatient Medications: Scheduled Meds: . apixaban  5 mg Oral BID  . atorvastatin  20 mg Oral QHS  . fenofibrate  160 mg Oral   Daily  . furosemide  40 mg Intravenous Once  . hydroxyurea  500 mg Oral BID  . losartan  25 mg Oral Daily  . metoprolol succinate  75 mg Oral BID  . pantoprazole  40 mg Oral Daily   Continuous Infusions:  PRN Meds: acetaminophen **OR** acetaminophen, metoprolol tartrate, ondansetron **OR** ondansetron (ZOFRAN) IV  Allergies:    Allergies  Allergen Reactions  . Codeine Nausea And Vomiting  . Morphine Other (See Comments)    GI symptoms    Social History:   Social History   Socioeconomic History  . Marital status: Widowed    Spouse name: widowed  . Number of children: 3  . Years of education: Not on file  . Highest education level: Not on file  Occupational History  . Occupation: Psychologist, prison and probation services  Tobacco Use  . Smoking status: Never Smoker  . Smokeless tobacco: Never Used  Vaping Use  . Vaping Use: Never used  Substance and Sexual Activity  . Alcohol use: No  . Drug use: No  . Sexual activity: Not Currently    Birth control/protection: Post-menopausal  Other Topics Concern  . Not on file   Social History Narrative   Married and resides in [place] with husband and 3 children   Sedentary lifestyle   Social Determinants of Health   Financial Resource Strain: Not on file  Food Insecurity: Not on file  Transportation Needs: No Transportation Needs  . Lack of Transportation (Medical): No  . Lack of Transportation (Non-Medical): No  Physical Activity: Inactive  . Days of Exercise per Week: 0 days  . Minutes of Exercise per Session: 0 min  Stress: Not on file  Social Connections: Not on file  Intimate Partner Violence: Not At Risk  . Fear of Current or Ex-Partner: No  . Emotionally Abused: No  . Physically Abused: No  . Sexually Abused: No    Family History:    Family History  Problem Relation Age of Onset  . Cancer Mother        female  . Stroke Sister   . Cancer Sister        breast  . Heart failure Father   . Coronary artery disease Neg Hx      ROS:  Please see the history of present illness.   All other ROS reviewed and negative.     Physical Exam/Data:   Vitals:   03/01/21 1620 03/01/21 1822 03/01/21 2200 03/02/21 0402  BP: (!) 142/78 115/73 121/77 117/65  Pulse: (!) 130 (!) 121 (!) 121 79  Resp: _0 Temp: 98.2 F (36.8 C) 97.9 F (36.6 C) 97.7 F (36.5 C) 98.7 F (37.1 C)  TempSrc: Oral Oral Oral Oral  SpO2: 95% 95% 96% 97%  Weight:      Height:        Intake/Output Summary (Last 24 hours) at 03/02/2021 1026 Last data filed at 03/02/2021 0900 Gross per 24 hour  Intake 240 ml  Output --  Net 240 ml   Last 3 Weights 03/01/2021 02/28/2021 01/18/2021  Weight (lbs) 169 lb 12.1 oz 179 lb 1.6 oz 169 lb 12.8 oz  Weight (kg) 77 kg 81.239 kg 77.021 kg     Body mass index is 30.07 kg/m.  General:  Well nourished, well developed female appearing in no acute distress.  HEENT: normal Lymph: no adenopathy Neck: no JVD Endocrine:  No thryomegaly Vascular: No carotid bruits; FA pulses 2+ bilaterally without bruits  Cardiac: Irregularly  irregular, 2/6 systolic murmur along Apex.  Lungs:  clear to auscultation bilaterally, decreased breath sounds along bases. Abd: soft, nontender, no hepatomegaly  Ext: no pitting edema Musculoskeletal:  No deformities, BUE and BLE strength normal and equal Skin: warm and dry  Neuro:  CNs 2-12 intact, no focal abnormalities noted Psych:  Normal affect   EKG:  The EKG was personally reviewed and demonstrates: Atrial fibrillation, HR 107 with known LBBB but does appear to intermittent p-waves. Telemetry:  Telemetry was personally reviewed and demonstrates: Atrial fibrillation, HR in 80's to 90's. Peaking into 120's at times.   Relevant CV Studies:  Echocardiogram: 11/2017 Study Conclusions   - Left ventricle: Abnormal septal motion Systolic function was  normal. The estimated ejection fraction was in the range of 50%  to 55%.  - Mitral valve: Restricted posterior leaflet motion. Severely  calcified annulus. Moderately thickened leaflets . There was  moderate regurgitation.  - Left atrium: The atrium was moderately dilated.  - Atrial septum: No defect or patent foramen ovale was identified.  Echo contrast study showed no right-to-left atrial level shunt,  at baseline or with provocation.   Event Monitor: 03/2019  7 day monitor  Min HR 65, Max HR 169, Avg HR 96  Rare ventricular ectopy all as isolated PVCs  Occasional supraventricular ectopy in the form of PACs, couplets. Fairly frequent runs of SVT up to 160 bpm  No symptoms reported    Echocardiogram: 03/01/2021 IMPRESSIONS    1. Left ventricular ejection fraction, by estimation, is 30 to 35%. The  left ventricle has moderately decreased function. The left ventricle  demonstrates global hypokinesis. There is mild left ventricular  hypertrophy. Left ventricular diastolic  parameters are indeterminate.  2. Right ventricular systolic function is low normal. The right  ventricular size is normal.  3. Left  atrial size was moderately dilated.  4. Right atrial size was moderately dilated.  5. The mitral valve is abnormal. Moderate mitral valve regurgitation. No  evidence of mitral stenosis.  6. Tricuspid valve regurgitation is moderate to severe.  7. The aortic valve is tricuspid. There is moderate calcification of the  aortic valve. There is moderate thickening of the aortic valve. Aortic  valve regurgitation is not visualized. No aortic stenosis is present.  8. The inferior vena cava is dilated in size with <50% respiratory  variability, suggesting right atrial pressure of 15 mmHg.   Laboratory Data:  High Sensitivity Troponin:   Recent Labs  Lab 02/28/21 1149 02/28/21 1357  TROPONINIHS 12 12     Chemistry Recent Labs  Lab 02/28/21 1213 03/01/21 0457 03/02/21 0434  NA 140 141 139  K 3.8 3.6 3.9  CL 105 105 103  CO2 _0 GLUCOSE 136* 125* 128*  BUN _1 CREATININE 0.77 0.69 0.79  CALCIUM 8.9 8.9 9.0  GFRNONAA >60 >60 >60  ANIONGAP _2 Recent Labs  Lab 02/28/21 1213  PROT 7.0  ALBUMIN 4.0  AST 17  ALT 23  ALKPHOS 130*  BILITOT 0.8   Hematology Recent Labs  Lab 02/28/21 1149 03/01/21 0457  WBC 6.0 5.3  RBC 2.73* 2.58*  HGB 11.5* 10.7*  HCT 33.0* 31.2*  MCV 120.9* 120.9*  MCH 42.1* 41.5*  MCHC 34.8 34.3  RDW 13.3 13.2  PLT 269 248   BNP Recent Labs  Lab 02/28/21 1213  BNP 599.0*    DDimer No results for input(s): DDIMER in the last 168 hours.  Radiology/Studies:  DG Chest 2 View  Result Date: 02/28/2021 CLINICAL DATA:  Short of breath EXAM: CHEST - 2 VIEW COMPARISON:  12/17/2017 FINDINGS: Sternotomy wires overlie normal cardiac silhouette. Lungs are hyperinflated. There is peripheral interstitial linear thickening. Small pleural effusions. No focal consolidation. On the lateral projection, there is a new density of the posterior aspect of the T11 vertebral body IMPRESSION: 1. Interstitial edema pattern and small effusions. 2.  Hyperinflated lungs. 3. New posterior lower lobe density on the lateral projection. Recommend CT of the thorax for further evaluation Electronically Signed   By: Stewart  Edmunds M.D.   On: 02/28/2021 12:35   CT Angio Chest PE W/Cm &/Or Wo Cm  Result Date: 02/28/2021 CLINICAL DATA:  Chest pressure since last night, arrhythmia EXAM: CT ANGIOGRAPHY CHEST WITH CONTRAST TECHNIQUE: Multidetector CT imaging of the chest was performed using the standard protocol during bolus administration of intravenous contrast. Multiplanar CT image reconstructions and MIPs were obtained to evaluate the vascular anatomy. CONTRAST:  100mL OMNIPAQUE IOHEXOL 350 MG/ML SOLN COMPARISON:  02/28/2021 FINDINGS: Cardiovascular: This is a technically adequate evaluation of the pulmonary vasculature. No filling defects or pulmonary emboli. Heart is enlarged without pericardial effusion. Reflux of contrast into the hepatic veins suggests cardiac dysfunction. No evidence of thoracic aortic aneurysm or dissection. Mild atherosclerosis. Mediastinum/Nodes: No enlarged mediastinal, hilar, or axillary lymph nodes. Thyroid gland, trachea, and esophagus demonstrate no significant findings. Lungs/Pleura: There are trace bilateral pleural effusions, right greater than left. No airspace disease or pneumothorax. The central airways are patent. Upper Abdomen: No acute abnormality. Musculoskeletal: No acute or destructive bony lesions. Reconstructed images demonstrate no additional findings. Review of the MIP images confirms the above findings. IMPRESSION: 1. No evidence of pulmonary embolus. 2. Cardiomegaly, with reflux of contrast into the hepatic veins suggesting cardiac dysfunction. 3. Trace bilateral pleural effusions, right greater than left. 4.  Aortic Atherosclerosis (ICD10-I70.0). Electronically Signed   By: Michael  Brown M.D.   On: 02/28/2021 15:55    Assessment and Plan:   1. Atrial Fibrillation with RVR - She has a history of palpitations  with prior monitor showing SVT but atrial fibrillation is a new diagnosis for her the admission. Unknown duration but patient's family reports a change in her dyspnea and palpitations over the past 3-4 weeks.  - Agree with titration of Toprol-XL to 75mg BID. Pending response today and assessment of HR with ambulation, may require further titration of Toprol-XL to 100mg BID. She has been started on Eliquis 5mg BID for anticoagulation given her CHA2DS2-VASc Score of 5 (CHF, HTN, Vascular, Female, Age). If she remains in atrial fibrillation following 3 weeks of anticoagulation, would consider DCCV and this was reviewed with the patient and her family today.   2. Acute Systolic CHF/New Cardiomyopathy - BNP was elevated to 599 on admission and CTA showed bilateral effusions. Repeat echo shows a reduced EF of 30-35% with global HK and mild LVH. RV function was low-normal and she had moderate biatrial dilation, moderate MR and moderate to severe TR.  - She still has orthopnea, therefore will dose IV Lasix 40mg x1. She was on Lasix 20mg daily prior to admission and will likely require 40mg daily at discharge with close follow-up BMET.  - Suspect her cardiomyopathy is secondary to her tachycardia so would plan for a repeat echo in 3 months for reassessment and will discuss possible ischemic evaluation with Dr. Branch but would anticipate this as an outpatient if her EF remains reduced.   3. HTN -   BP has been stable at 109/79 - 142/78 with the past 24 hours. Will reduce Losartan from 50mg daily to 25mg daily to allow for titration of AV nodal blocking agents. May need to discontinue altogether pending BP response.   4. ASD - She is s/p repair in 1994 and repeat echo this admission showed no atrial level shunt detected by doppler.   5. Thrombocytosis - Followed by Hematology as an outpatient and on Hydroxyurea in the outpatient setting.    For questions or updates, please contact [facility] Please  consult [email or web address] for contact info under    Signed, Brittany M Strader, PA-C  03/02/2021 10:26 AM Attending note  Patient seen and discussed with PA Strader, I agree with her documentaiton. 74 yo female history of PSVT, remote history of VT with benign cardiac workup at the time, chronic LBBB HTN admitted with SOB, orthopnea, palpitations. Found to be in new onset afib this admsision with evidence of fluid overload.    Admit labs WBC 6 Hgb 11.5 plt 269 Mg 1.7 TSH 3.4 BNP 599 K 3.8 Cr 0.77 Trop 12-->12 COVID neg CXR pulm edema CT PE no PE EKG afib  Echo LVEf 30-35%, indet dd, low normal RV function. Mod MR, mod to severe TR, dilated IVC   1. New diagnosis of afib - toprol increased to 75mg bid with first higher dose this AM, increase further to 100mg bid.  - started on eliquis 5mg bid - if cannot rate control sufficently would consider TEE/DCCV on Friday  2. Acute systolic HF - 11/2017 echo LVE 50-55% - 02/2021 echo LVE 30-35%  - in setting of new onset afib suspect likely tachy mediated CM - she is on toprol 75mg bid. Low dose losartan may convert to entresto this admission pending hemodynamics with av nodal titration for her afib. Consider aldactone, farxiga in the near future inpatient vs outpatient pending labs and hemodynamics. - dosed IV lasix 40mg x 1 today,would redose again this evening and reassess diuretic dosing tomorrow - would work to control afib over next several weeks and reassess LVEF at that time, would defer ischemic testing at this time.   Heather Branch MD  

## 2021-03-02 NOTE — Progress Notes (Signed)
PROGRESS NOTE    Heather Barber  [id number] DOB: 1946/05/21 DOA: 02/28/2021 PCP: Dettinger, Fransisca Kaufmann, MD   Brief Narrative:   Heather Barber a 75 y.o.femalewith medical history significant forLBBB, ventricular tachycardia, hypertension, diabetes mellitus, endometrial cancer. Patient presented to the ED with complaints of chest pain and difficulty breathing that started last night when was lying down to sleep.   Patient was admitted with new onset atrial fibrillation and now appears to have some component of  Assessment & Plan:   Principal Problem:   New onset atrial fibrillation [facility]) Active Problems:   Thrombocytosis   Type 2 diabetes mellitus with other specified complication ([name])   Atrial fibrillation with RVR ([name])   Symptomatic, new onset atrial fibrillation with RVR -Started on Eliquis given CHA2DS2-VASc score 4 -Increased home metoprolol from 50 mg twice daily to 75 mg twice daily, may need further titration depending on HR -Updated echocardiogram with findings of new cardiomyopathy and reduced LVEF of 30-35% -TSH 3.423 -No electrolyte abnormalities -Continues to have heart rate elevation that will require ongoing monitoring  Acute systolic CHF exacerbation -Lasix dosing as ordered by cardiology -May have new tachycardia induced cardiomyopathy with plans to repeat 2D echocardiogram in 3 months -Ischemic testing is deferred at this time  Hypertension-stable -Continue current medications with metoprolol dose increased and diuresis with Lasix  Diabetes mellitus -Blood Glucose levels are stable -Hemoglobin A1c 6.7% on 11/2020  Old LBBB -Continue beta-blocker with increased dose as noted above  Thrombocytosis -Hydroxyurea resumed -Follows with Dr. Delton Coombes   DVT prophylaxis: Eliquis Code Status: Full Family Communication: Discussed with daughter at bedside 5/4 Disposition Plan:  Status is: Inpatient  Remains inpatient appropriate  because:Hemodynamically unstable, Ongoing diagnostic testing needed not appropriate for outpatient work up, IV treatments appropriate due to intensity of illness or inability to take PO and Inpatient level of care appropriate due to severity of illness   Dispo: The patient is from: Home  Anticipated d/c is to: Home  Patient currently is not medically stable to d/c.              Difficult to place patient No  Consultants:   Cardiology  Procedures:   See below  Antimicrobials:   None   Subjective: Patient seen and evaluated today with no new acute complaints or concerns. No acute concerns or events noted overnight.  She states that overall she is feeling better than when she was admitted with mild shortness of breath.  Denies any palpitations or chest pain.  Objective: Vitals:   03/01/21 1620 03/01/21 1822 03/01/21 2200 03/02/21 0402  BP: (!) 142/78 115/73 121/77 117/65  Pulse: (!) 130 (!) 121 (!) 121 79  Resp: 18 18 18 20   Temp: 98.2 F (36.8 C) 97.9 F (36.6 C) 97.7 F (36.5 C) 98.7 F (37.1 C)  TempSrc: Oral Oral Oral Oral  SpO2: 95% 95% 96% 97%  Weight:      Height:        Intake/Output Summary (Last 24 hours) at 03/02/2021 1226 Last data filed at 03/02/2021 0900 Gross per 24 hour  Intake 240 ml  Output --  Net 240 ml   Filed Weights   02/28/21 1321 03/01/21 0500  Weight: 81.2 kg 77 kg    Examination:  General exam: Appears calm and comfortable  Respiratory system: Clear to auscultation. Respiratory effort normal. Cardiovascular system: S1 & S2 heard, irregular and minimally tachycardic. Gastrointestinal system: Abdomen is soft Central nervous system: Alert and awake Extremities: No edema  Skin: No significant lesions noted Psychiatry: Flat affect.    Data Reviewed: I have personally reviewed following labs and imaging studies  CBC: Recent Labs  Lab 02/28/21 1149 03/01/21 0457  WBC 6.0 5.3  HGB 11.5* 10.7*  HCT  33.0* 31.2*  MCV 120.9* 120.9*  PLT 269 563   Basic Metabolic Panel: Recent Labs  Lab 02/28/21 1213 03/01/21 0457 03/02/21 0434  NA 140 141 139  K 3.8 3.6 3.9  CL 105 105 103  CO2 27 28 28   GLUCOSE 136* 125* 128*  BUN 18 17 16   CREATININE 0.77 0.69 0.79  CALCIUM 8.9 8.9 9.0  MG 1.7  --  1.7   GFR: Estimated Creatinine Clearance: 60.6 mL/min (by C-G formula based on SCr of 0.79 mg/dL). Liver Function Tests: Recent Labs  Lab 02/28/21 1213  AST 17  ALT 23  ALKPHOS 130*  BILITOT 0.8  PROT 7.0  ALBUMIN 4.0   No results for input(s): LIPASE, AMYLASE in the last 168 hours. No results for input(s): AMMONIA in the last 168 hours. Coagulation Profile: No results for input(s): INR, PROTIME in the last 168 hours. Cardiac Enzymes: No results for input(s): CKTOTAL, CKMB, CKMBINDEX, TROPONINI in the last 168 hours. BNP (last 3 results) No results for input(s): PROBNP in the last 8760 hours. HbA1C: No results for input(s): HGBA1C in the last 72 hours. CBG: No results for input(s): GLUCAP in the last 168 hours. Lipid Profile: No results for input(s): CHOL, HDL, LDLCALC, TRIG, CHOLHDL, LDLDIRECT in the last 72 hours. Thyroid Function Tests: Recent Labs    02/28/21 1213  TSH 3.423   Anemia Panel: No results for input(s): VITAMINB12, FOLATE, FERRITIN, TIBC, IRON, RETICCTPCT in the last 72 hours. Sepsis Labs: No results for input(s): PROCALCITON, LATICACIDVEN in the last 168 hours.  Recent Results (from the past 240 hour(s))  Resp Panel by RT-PCR (Flu A&B, Covid) Nasopharyngeal Swab     Status: None   Collection Time: 02/28/21 12:56 PM   Specimen: Nasopharyngeal Swab; Nasopharyngeal(NP) swabs in vial transport medium  Result Value Ref Range Status   SARS Coronavirus 2 by RT PCR NEGATIVE NEGATIVE Final    Comment: (NOTE) SARS-CoV-2 target nucleic acids are NOT DETECTED.  The SARS-CoV-2 RNA is generally detectable in upper respiratory specimens during the acute phase of  infection. The lowest concentration of SARS-CoV-2 viral copies this assay can detect is 138 copies/mL. A negative result does not preclude SARS-Cov-2 infection and should not be used as the sole basis for treatment or other patient management decisions. A negative result may occur with  improper specimen collection/handling, submission of specimen other than nasopharyngeal swab, presence of viral mutation(s) within the areas targeted by this assay, and inadequate number of viral copies(<138 copies/mL). A negative result must be combined with clinical observations, patient history, and epidemiological information. The expected result is Negative.  Fact Sheet for Patients:  EntrepreneurPulse.com.au  Fact Sheet for Healthcare Providers:  IncredibleEmployment.be  This test is no t yet approved or cleared by the [place] FDA and  has been authorized for detection and/or diagnosis of SARS-CoV-2 by FDA under an Emergency Use Authorization (EUA). This EUA will remain  in effect (meaning this test can be used) for the duration of the COVID-19 declaration under Section 564(b)(1) of the Act, 21 U.S.C.section 360bbb-3(b)(1), unless the authorization is terminated  or revoked sooner.       Influenza A by PCR NEGATIVE NEGATIVE Final   Influenza B by PCR NEGATIVE NEGATIVE Final  Comment: (NOTE) The Xpert Xpress SARS-CoV-2/FLU/RSV plus assay is intended as an aid in the diagnosis of influenza from Nasopharyngeal swab specimens and should not be used as a sole basis for treatment. Nasal washings and aspirates are unacceptable for Xpert Xpress SARS-CoV-2/FLU/RSV testing.  Fact Sheet for Patients: EntrepreneurPulse.com.au  Fact Sheet for Healthcare Providers: IncredibleEmployment.be  This test is not yet approved or cleared by the [place] FDA and has been authorized for detection and/or diagnosis of SARS-CoV-2  by FDA under an Emergency Use Authorization (EUA). This EUA will remain in effect (meaning this test can be used) for the duration of the COVID-19 declaration under Section 564(b)(1) of the Act, 21 U.S.C. section 360bbb-3(b)(1), unless the authorization is terminated or revoked.  Performed at [facility], [address] [place], [place] [address]          Radiology Studies: CT Angio Chest PE W/Cm &/Or Wo Cm  Result Date: 02/28/2021 CLINICAL DATA:  Chest pressure since last night, arrhythmia EXAM: CT ANGIOGRAPHY CHEST WITH CONTRAST TECHNIQUE: Multidetector CT imaging of the chest was performed using the standard protocol during bolus administration of intravenous contrast. Multiplanar CT image reconstructions and MIPs were obtained to evaluate the vascular anatomy. CONTRAST:  161mL OMNIPAQUE IOHEXOL 350 MG/ML SOLN COMPARISON:  02/28/2021 FINDINGS: Cardiovascular: This is a technically adequate evaluation of the pulmonary vasculature. No filling defects or pulmonary emboli. Heart is enlarged without pericardial effusion. Reflux of contrast into the hepatic veins suggests cardiac dysfunction. No evidence of thoracic aortic aneurysm or dissection. Mild atherosclerosis. Mediastinum/Nodes: No enlarged mediastinal, hilar, or axillary lymph nodes. Thyroid gland, trachea, and esophagus demonstrate no significant findings. Lungs/Pleura: There are trace bilateral pleural effusions, right greater than left. No airspace disease or pneumothorax. The central airways are patent. Upper Abdomen: No acute abnormality. Musculoskeletal: No acute or destructive bony lesions. Reconstructed images demonstrate no additional findings. Review of the MIP images confirms the above findings. IMPRESSION: 1. No evidence of pulmonary embolus. 2. Cardiomegaly, with reflux of contrast into the hepatic veins suggesting cardiac dysfunction. 3. Trace bilateral pleural effusions, right greater than left. 4.  Aortic Atherosclerosis  (ICD10-I70.0). Electronically Signed   By: Randa Ngo M.D.   On: 02/28/2021 15:55   ECHOCARDIOGRAM COMPLETE  Result Date: 03/01/2021    ECHOCARDIOGRAM REPORT   Patient Name:   RHYSE SKOWRON Date of Exam: 03/01/2021 Medical Rec #:  [id number]      Height:       63.0 in Accession #:    [id number]     Weight:       169.8 lb Date of Birth:  09-Dec-1945      BSA:          1.803 m Patient Age:    36 years       BP:           125/80 mmHg Patient Gender: F              HR:           127 bpm. Exam Location:  [facility] Procedure: 2D Echo, Cardiac Doppler and Color Doppler Indications:    Atrial Fibrillation I48.91  History:        Patient has prior history of Echocardiogram examinations, most                 recent 12/08/2017. Arrythmias:LBBB; Risk Factors:Hypertension,                 Dyslipidemia and Diabetes. ASD repair, GERD.  Sonographer:    Alvino Chapel RCS Referring Phys: [phone] [place]  1. Left ventricular ejection fraction, by estimation, is 30 to 35%. The left ventricle has moderately decreased function. The left ventricle demonstrates global hypokinesis. There is mild left ventricular hypertrophy. Left ventricular diastolic parameters are indeterminate.  2. Right ventricular systolic function is low normal. The right ventricular size is normal.  3. Left atrial size was moderately dilated.  4. Right atrial size was moderately dilated.  5. The mitral valve is abnormal. Moderate mitral valve regurgitation. No evidence of mitral stenosis.  6. Tricuspid valve regurgitation is moderate to severe.  7. The aortic valve is tricuspid. There is moderate calcification of the aortic valve. There is moderate thickening of the aortic valve. Aortic valve regurgitation is not visualized. No aortic stenosis is present.  8. The inferior vena cava is dilated in size with <50% respiratory variability, suggesting right atrial pressure of 15 mmHg. FINDINGS  Left Ventricle: Left ventricular ejection fraction, by  estimation, is 30 to 35%. The left ventricle has moderately decreased function. The left ventricle demonstrates global hypokinesis. The left ventricular internal cavity size was normal in size. There is mild left ventricular hypertrophy. Left ventricular diastolic parameters are indeterminate. Right Ventricle: The right ventricular size is normal. Right vetricular wall thickness was not assessed. Right ventricular systolic function is low normal. Left Atrium: Left atrial size was moderately dilated. Right Atrium: Right atrial size was moderately dilated. Pericardium: There is no evidence of pericardial effusion. Mitral Valve: The mitral valve is abnormal. There is mild thickening of the mitral valve leaflet(s). There is mild calcification of the mitral valve leaflet(s). Mild mitral annular calcification. Moderate mitral valve regurgitation. No evidence of mitral  valve stenosis. Tricuspid Valve: The tricuspid valve is normal in structure. Tricuspid valve regurgitation is moderate to severe. No evidence of tricuspid stenosis. Aortic Valve: The aortic valve is tricuspid. There is moderate calcification of the aortic valve. There is moderate thickening of the aortic valve. There is moderate aortic valve annular calcification. Aortic valve regurgitation is not visualized. No aortic stenosis is present. Aortic valve mean gradient measures 3.5 mmHg. Aortic valve peak gradient measures 8.1 mmHg. Aortic valve area, by VTI measures 1.30 cm. Pulmonic Valve: The pulmonic valve was not well visualized. Pulmonic valve regurgitation is not visualized. No evidence of pulmonic stenosis. Aorta: The aortic root is normal in size and structure. Pulmonary Artery: Moderate to severe pulmonary HTN, PASP is 39mmHg. Venous: The inferior vena cava is dilated in size with less than 50% respiratory variability, suggesting right atrial pressure of 15 mmHg. IAS/Shunts: No atrial level shunt detected by color flow Doppler.  LEFT VENTRICLE PLAX  2D LVIDd:         5.00 cm LVIDs:         4.10 cm LV PW:         1.20 cm LV IVS:        1.00 cm LVOT diam:     1.60 cm LV SV:         23 LV SV Index:   13 LVOT Area:     2.01 cm  RIGHT VENTRICLE TAPSE (M-mode): 1.7 cm LEFT ATRIUM             Index       RIGHT ATRIUM           Index LA diam:        4.40 cm 2.44 cm/m  RA Area:     22.70  cm LA Vol (A2C):   87.5 ml 48.52 ml/m RA Volume:   65.90 ml  36.54 ml/m LA Vol (A4C):   67.3 ml 37.32 ml/m LA Biplane Vol: 81.3 ml 45.08 ml/m  AORTIC VALVE AV Area (Vmax):    1.10 cm AV Area (Vmean):   1.15 cm AV Area (VTI):     1.30 cm AV Vmax:           142.36 cm/s AV Vmean:          84.655 cm/s AV VTI:            0.176 m AV Peak Grad:      8.1 mmHg AV Mean Grad:      3.5 mmHg LVOT Vmax:         77.60 cm/s LVOT Vmean:        48.300 cm/s LVOT VTI:          0.114 m LVOT/AV VTI ratio: 0.65  AORTA Ao Root diam: 3.20 cm MITRAL VALVE                 TRICUSPID VALVE MV Area (PHT): 6.32 cm      TR Peak grad:   44.9 mmHg MV Decel Time: 120 msec      TR Vmax:        335.00 cm/s MR Peak grad:    109.4 mmHg MR Mean grad:    69.0 mmHg   SHUNTS MR Vmax:         523.00 cm/s Systemic VTI:  0.11 m MR Vmean:        382.0 cm/s  Systemic Diam: 1.60 cm MR PISA:         1.57 cm MR PISA Eff ROA: 9 mm MR PISA Radius:  0.50 cm MV E velocity: 175.00 cm/s Carlyle Dolly MD Electronically signed by Carlyle Dolly MD Signature Date/Time: 03/01/2021/4:35:12 PM    Final         Scheduled Meds: . apixaban  5 mg Oral BID  . atorvastatin  20 mg Oral QHS  . fenofibrate  160 mg Oral Daily  . furosemide  40 mg Intravenous Once  . hydroxyurea  500 mg Oral BID  . losartan  25 mg Oral Daily  . metoprolol succinate  100 mg Oral BID  . metoprolol succinate  25 mg Oral Once  . pantoprazole  40 mg Oral Daily    LOS: 1 day    Time spent: 35 minutes    Kamin Niblack Darleen Crocker, DO [facility]  If 7PM-7AM, please contact night-coverage [email or web address] 03/02/2021, 12:26 PM

## 2021-03-03 MED ORDER — DILTIAZEM HCL 25 MG/5ML IV SOLN
10.0000 mg | Freq: Once | INTRAVENOUS | Status: AC
  Administered 2021-03-03: 10 mg via INTRAVENOUS
  Filled 2021-03-03: qty 5

## 2021-03-03 NOTE — Progress Notes (Signed)
EKG done and given to nurse 

## 2021-03-03 NOTE — Progress Notes (Signed)
PROGRESS NOTE    Heather Barber  MVH:[id number] DOB: 1946/10/02 DOA: 02/28/2021 PCP: Dettinger, Fransisca Kaufmann, MD   Brief Narrative:   Heather Lorge Hyltonis a 75 y.o.femalewith medical history significant forLBBB, ventricular tachycardia, hypertension, diabetes mellitus, endometrial cancer. Patient presented to the ED with complaints of chest pain and difficulty breathing that started last night when was lying down to sleep.  Patient was admitted with new onset atrial fibrillation.  She continues to have significant RVR with ambulation and cardiology is now recommending TEE/cardioversion in a.m.  Assessment & Plan:   Principal Problem:   New onset atrial fibrillation [facility]) Active Problems:   Thrombocytosis   Type 2 diabetes mellitus with other specified complication ([name])   Atrial fibrillation with RVR ([name])   Symptomatic, new onset atrial fibrillation with RVR -Started on Eliquis given CHA2DS2-VASc score 4 -Increased home metoprolol from 50 mg twice daily to 100 mg twice daily, but continues to have significant RVR with ambulation -Cardiology planning TEE/cardioversion in a.m. -Updated echocardiogram with findings of new cardiomyopathy and reduced LVEF of 30-35% -TSH 3.423 -No electrolyte abnormalities -Continues to have heart rate elevationthatwill require ongoing monitoring  Acute systolic CHF exacerbation -Lasix dosing as ordered by cardiology, now discontinued -May have new tachycardia induced cardiomyopathy with plans to repeat 2D echocardiogram in 3 months -Ischemic testing is deferred at this time  Hypertension-stable -Continue current medications with metoprolol dose increased and diuresis with Lasix  Diabetes mellitus -Blood Glucose levels are stable -Hemoglobin A1c 6.7% on 11/2020  Old LBBB -Continue beta-blocker with increased dose as noted above  Thrombocytosis -Hydroxyurea resumed -Follows with Dr. Delton Coombes   DVT prophylaxis:Eliquis Code  Status:Full Family Communication:Discussed with daughter at bedside 5/5 Disposition Plan: Status is: Inpatient  Remains inpatient appropriate because:Hemodynamically unstable, Ongoing diagnostic testing needed not appropriate for outpatient work up, IV treatments appropriate due to intensity of illness or inability to take PO and Inpatient level of care appropriate due to severity of illness   Dispo: The patient is from:Home Anticipated d/c is WU:XLKG Patient currently is not medically stable to d/c. Difficult to place patient No  Consultants:  Cardiology  Procedures:  See below  Antimicrobials:  None  Subjective: Patient seen and evaluated today with no new acute complaints or concerns. No acute concerns or events noted overnight. She is now noted to have some elevated HR with ambulation.  Objective: Vitals:   03/03/21 0314 03/03/21 0500 03/03/21 1000 03/03/21 1333  BP: 111/87   119/81  Pulse: (!) 50  98 (!) 59  Resp: 20   20  Temp: (!) 97.5 F (36.4 C)   98.1 F (36.7 C)  TempSrc:    Oral  SpO2: 98%   96%  Weight:  77.1 kg    Height:        Intake/Output Summary (Last 24 hours) at 03/03/2021 1337 Last data filed at 03/02/2021 1700 Gross per 24 hour  Intake 240 ml  Output --  Net 240 ml   Filed Weights   02/28/21 1321 03/01/21 0500 03/03/21 0500  Weight: 81.2 kg 77 kg 77.1 kg    Examination:  General exam: Appears calm and comfortable  Respiratory system: Clear to auscultation. Respiratory effort normal. Cardiovascular system: S1 & S2 heard, irregular.  Gastrointestinal system: Abdomen is soft Central nervous system: Alert and awake Extremities: No edema Skin: No significant lesions noted Psychiatry: Flat affect.    Data Reviewed: I have personally reviewed following labs and imaging studies  CBC: Recent Labs  Lab 02/28/21 1149  03/01/21 0457  WBC 6.0 5.3  HGB 11.5* 10.7*  HCT 33.0* 31.2*   MCV 120.9* 120.9*  PLT 269 [id number]   Basic Metabolic Panel: Recent Labs  Lab 02/28/21 1213 03/01/21 0457 03/02/21 0434  NA 140 141 139  K 3.8 3.6 3.9  CL 105 105 103  CO2 27 28 28   GLUCOSE 136* 125* 128*  BUN 18 17 16   CREATININE 0.77 0.69 0.79  CALCIUM 8.9 8.9 9.0  MG 1.7  --  1.7   GFR: Estimated Creatinine Clearance: 60.7 mL/min (by C-G formula based on SCr of 0.79 mg/dL). Liver Function Tests: Recent Labs  Lab 02/28/21 1213  AST 17  ALT 23  ALKPHOS 130*  BILITOT 0.8  PROT 7.0  ALBUMIN 4.0   No results for input(s): LIPASE, AMYLASE in the last 168 hours. No results for input(s): AMMONIA in the last 168 hours. Coagulation Profile: No results for input(s): INR, PROTIME in the last 168 hours. Cardiac Enzymes: No results for input(s): CKTOTAL, CKMB, CKMBINDEX, TROPONINI in the last 168 hours. BNP (last 3 results) No results for input(s): PROBNP in the last 8760 hours. HbA1C: No results for input(s): HGBA1C in the last 72 hours. CBG: No results for input(s): GLUCAP in the last 168 hours. Lipid Profile: No results for input(s): CHOL, HDL, LDLCALC, TRIG, CHOLHDL, LDLDIRECT in the last 72 hours. Thyroid Function Tests: No results for input(s): TSH, T4TOTAL, FREET4, T3FREE, THYROIDAB in the last 72 hours. Anemia Panel: No results for input(s): VITAMINB12, FOLATE, FERRITIN, TIBC, IRON, RETICCTPCT in the last 72 hours. Sepsis Labs: No results for input(s): PROCALCITON, LATICACIDVEN in the last 168 hours.  Recent Results (from the past 240 hour(s))  Resp Panel by RT-PCR (Flu A&B, Covid) Nasopharyngeal Swab     Status: None   Collection Time: 02/28/21 12:56 PM   Specimen: Nasopharyngeal Swab; Nasopharyngeal(NP) swabs in vial transport medium  Result Value Ref Range Status   SARS Coronavirus 2 by RT PCR NEGATIVE NEGATIVE Final    Comment: (NOTE) SARS-CoV-2 target nucleic acids are NOT DETECTED.  The SARS-CoV-2 RNA is generally detectable in upper  respiratory specimens during the acute phase of infection. The lowest concentration of SARS-CoV-2 viral copies this assay can detect is 138 copies/mL. A negative result does not preclude SARS-Cov-2 infection and should not be used as the sole basis for treatment or other patient management decisions. A negative result may occur with  improper specimen collection/handling, submission of specimen other than nasopharyngeal swab, presence of viral mutation(s) within the areas targeted by this assay, and inadequate number of viral copies(<138 copies/mL). A negative result must be combined with clinical observations, patient history, and epidemiological information. The expected result is Negative.  Fact Sheet for Patients:  EntrepreneurPulse.com.au  Fact Sheet for Healthcare Providers:  IncredibleEmployment.be  This test is no t yet approved or cleared by the [place] FDA and  has been authorized for detection and/or diagnosis of SARS-CoV-2 by FDA under an Emergency Use Authorization (EUA). This EUA will remain  in effect (meaning this test can be used) for the duration of the COVID-19 declaration under Section 564(b)(1) of the Act, 21 U.S.C.section 360bbb-3(b)(1), unless the authorization is terminated  or revoked sooner.       Influenza A by PCR NEGATIVE NEGATIVE Final   Influenza B by PCR NEGATIVE NEGATIVE Final    Comment: (NOTE) The Xpert Xpress SARS-CoV-2/FLU/RSV plus assay is intended as an aid in the diagnosis of influenza from Nasopharyngeal swab specimens and should not be used  as a sole basis for treatment. Nasal washings and aspirates are unacceptable for Xpert Xpress SARS-CoV-2/FLU/RSV testing.  Fact Sheet for Patients: EntrepreneurPulse.com.au  Fact Sheet for Healthcare Providers: IncredibleEmployment.be  This test is not yet approved or cleared by the [place] FDA and has been  authorized for detection and/or diagnosis of SARS-CoV-2 by FDA under an Emergency Use Authorization (EUA). This EUA will remain in effect (meaning this test can be used) for the duration of the COVID-19 declaration under Section 564(b)(1) of the Act, 21 U.S.C. section 360bbb-3(b)(1), unless the authorization is terminated or revoked.  Performed at [facility], [address] [place], [place] [address]          Radiology Studies: ECHOCARDIOGRAM COMPLETE  Result Date: 03/01/2021    ECHOCARDIOGRAM REPORT   Patient Name:   NIVEDITA MIRABELLA Date of Exam: 03/01/2021 Medical Rec #:  [id number]      Height:       63.0 in Accession #:    [id number]     Weight:       169.8 lb Date of Birth:  05-Jul-1946      BSA:          1.803 m Patient Age:    4 years       BP:           125/80 mmHg Patient Gender: F              HR:           127 bpm. Exam Location:  [facility] Procedure: 2D Echo, Cardiac Doppler and Color Doppler Indications:    Atrial Fibrillation I48.91  History:        Patient has prior history of Echocardiogram examinations, most                 recent 12/08/2017. Arrythmias:LBBB; Risk Factors:Hypertension,                 Dyslipidemia and Diabetes. ASD repair, GERD.  Sonographer:    Alvino Chapel RCS Referring Phys: [phone] [place]  1. Left ventricular ejection fraction, by estimation, is 30 to 35%. The left ventricle has moderately decreased function. The left ventricle demonstrates global hypokinesis. There is mild left ventricular hypertrophy. Left ventricular diastolic parameters are indeterminate.  2. Right ventricular systolic function is low normal. The right ventricular size is normal.  3. Left atrial size was moderately dilated.  4. Right atrial size was moderately dilated.  5. The mitral valve is abnormal. Moderate mitral valve regurgitation. No evidence of mitral stenosis.  6. Tricuspid valve regurgitation is moderate to severe.  7. The aortic valve is tricuspid. There is  moderate calcification of the aortic valve. There is moderate thickening of the aortic valve. Aortic valve regurgitation is not visualized. No aortic stenosis is present.  8. The inferior vena cava is dilated in size with <50% respiratory variability, suggesting right atrial pressure of 15 mmHg. FINDINGS  Left Ventricle: Left ventricular ejection fraction, by estimation, is 30 to 35%. The left ventricle has moderately decreased function. The left ventricle demonstrates global hypokinesis. The left ventricular internal cavity size was normal in size. There is mild left ventricular hypertrophy. Left ventricular diastolic parameters are indeterminate. Right Ventricle: The right ventricular size is normal. Right vetricular wall thickness was not assessed. Right ventricular systolic function is low normal. Left Atrium: Left atrial size was moderately dilated. Right Atrium: Right atrial size was moderately dilated. Pericardium: There is no evidence of pericardial effusion.  Mitral Valve: The mitral valve is abnormal. There is mild thickening of the mitral valve leaflet(s). There is mild calcification of the mitral valve leaflet(s). Mild mitral annular calcification. Moderate mitral valve regurgitation. No evidence of mitral  valve stenosis. Tricuspid Valve: The tricuspid valve is normal in structure. Tricuspid valve regurgitation is moderate to severe. No evidence of tricuspid stenosis. Aortic Valve: The aortic valve is tricuspid. There is moderate calcification of the aortic valve. There is moderate thickening of the aortic valve. There is moderate aortic valve annular calcification. Aortic valve regurgitation is not visualized. No aortic stenosis is present. Aortic valve mean gradient measures 3.5 mmHg. Aortic valve peak gradient measures 8.1 mmHg. Aortic valve area, by VTI measures 1.30 cm. Pulmonic Valve: The pulmonic valve was not well visualized. Pulmonic valve regurgitation is not visualized. No evidence of  pulmonic stenosis. Aorta: The aortic root is normal in size and structure. Pulmonary Artery: Moderate to severe pulmonary HTN, PASP is 51mmHg. Venous: The inferior vena cava is dilated in size with less than 50% respiratory variability, suggesting right atrial pressure of 15 mmHg. IAS/Shunts: No atrial level shunt detected by color flow Doppler.  LEFT VENTRICLE PLAX 2D LVIDd:         5.00 cm LVIDs:         4.10 cm LV PW:         1.20 cm LV IVS:        1.00 cm LVOT diam:     1.60 cm LV SV:         23 LV SV Index:   13 LVOT Area:     2.01 cm  RIGHT VENTRICLE TAPSE (M-mode): 1.7 cm LEFT ATRIUM             Index       RIGHT ATRIUM           Index LA diam:        4.40 cm 2.44 cm/m  RA Area:     22.70 cm LA Vol (A2C):   87.5 ml 48.52 ml/m RA Volume:   65.90 ml  36.54 ml/m LA Vol (A4C):   67.3 ml 37.32 ml/m LA Biplane Vol: 81.3 ml 45.08 ml/m  AORTIC VALVE AV Area (Vmax):    1.10 cm AV Area (Vmean):   1.15 cm AV Area (VTI):     1.30 cm AV Vmax:           142.36 cm/s AV Vmean:          84.655 cm/s AV VTI:            0.176 m AV Peak Grad:      8.1 mmHg AV Mean Grad:      3.5 mmHg LVOT Vmax:         77.60 cm/s LVOT Vmean:        48.300 cm/s LVOT VTI:          0.114 m LVOT/AV VTI ratio: 0.65  AORTA Ao Root diam: 3.20 cm MITRAL VALVE                 TRICUSPID VALVE MV Area (PHT): 6.32 cm      TR Peak grad:   44.9 mmHg MV Decel Time: 120 msec      TR Vmax:        335.00 cm/s MR Peak grad:    109.4 mmHg MR Mean grad:    69.0 mmHg   SHUNTS MR Vmax:  523.00 cm/s Systemic VTI:  0.11 m MR Vmean:        382.0 cm/s  Systemic Diam: 1.60 cm MR PISA:         1.57 cm MR PISA Eff ROA: 9 mm MR PISA Radius:  0.50 cm MV E velocity: 175.00 cm/s Carlyle Dolly MD Electronically signed by Carlyle Dolly MD Signature Date/Time: 03/01/2021/4:35:12 PM    Final         Scheduled Meds: . apixaban  5 mg Oral BID  . atorvastatin  20 mg Oral QHS  . fenofibrate  160 mg Oral Daily  . hydroxyurea  500 mg Oral BID  . losartan   25 mg Oral Daily  . metoprolol succinate  100 mg Oral BID  . pantoprazole  40 mg Oral Daily    LOS: 2 days    Time spent: 35 minutes    Saesha Llerenas Darleen Crocker, DO [facility]  If 7PM-7AM, please contact night-coverage [email or web address] 03/03/2021, 1:37 PM

## 2021-03-03 NOTE — Progress Notes (Signed)
    Dr. Paticia Stack recommended a TEE/DCCV for this patient. Scheduled for 03/04/2021 at 1430 with Dr. Harl Bowie. He is aware and echo is aware as well. Risks and benefits reviewed with the patient and her daughter at the bedside and she is in agreement to proceed. Will be NPO after midnight. Procedure orders have been entered.   Signed, Erma Heritage, PA-C 03/03/2021, 5:06 PM Pager: [phone]

## 2021-03-03 NOTE — Progress Notes (Signed)
Progress Note  Patient Name: Heather Barber Date of Encounter: 03/03/2021  [facility] Cardiologist: Carlyle Dolly, MD   Subjective   Denies chest pain, shortness of breath or palpitations  Inpatient Medications    Scheduled Meds: . apixaban  5 mg Oral BID  . atorvastatin  20 mg Oral QHS  . fenofibrate  160 mg Oral Daily  . hydroxyurea  500 mg Oral BID  . losartan  25 mg Oral Daily  . metoprolol succinate  100 mg Oral BID  . pantoprazole  40 mg Oral Daily   Continuous Infusions:  PRN Meds: acetaminophen **OR** acetaminophen, metoprolol tartrate, ondansetron **OR** ondansetron (ZOFRAN) IV   Vital Signs    Vitals:   03/03/21 0314 03/03/21 0500 03/03/21 1000 03/03/21 1333  BP: 111/87   119/81  Pulse: (!) 50  98 80  Resp: 20   20  Temp: (!) 97.5 F (36.4 C)   98.1 F (36.7 C)  TempSrc:    Oral  SpO2: 98%   96%  Weight:  77.1 kg    Height:        Intake/Output Summary (Last 24 hours) at 03/03/2021 1411 Last data filed at 03/03/2021 1300 Gross per 24 hour  Intake 440 ml  Output 4 ml  Net 436 ml   Last 3 Weights 03/03/2021 03/01/2021 02/28/2021  Weight (lbs) 169 lb 15.6 oz 169 lb 12.1 oz 179 lb 1.6 oz  Weight (kg) 77.1 kg 77 kg 81.239 kg      Telemetry    AFIB (rate 90s) - Personally Reviewed  ECG    N/A - Personally Reviewed  Physical Exam   GEN: No acute distress.   Neck: No JVD Cardiac: irregularly irregular, 2/6 systolic murmur  Respiratory: Clear to auscultation bilaterally. GI: Soft, nontender, non-distended  MS: No edema; No deformity. Neuro:  Nonfocal  Psych: Normal affect   Labs    High Sensitivity Troponin:   Recent Labs  Lab 02/28/21 1149 02/28/21 1357  TROPONINIHS 12 12      Chemistry Recent Labs  Lab 02/28/21 1213 03/01/21 0457 03/02/21 0434  NA 140 141 139  K 3.8 3.6 3.9  CL 105 105 103  CO2 27 28 28   GLUCOSE 136* 125* 128*  BUN 18 17 16   CREATININE 0.77 0.69 0.79  CALCIUM 8.9 8.9 9.0  PROT 7.0  --   --   ALBUMIN  4.0  --   --   AST 17  --   --   ALT 23  --   --   ALKPHOS 130*  --   --   BILITOT 0.8  --   --   GFRNONAA >60 >60 >60  ANIONGAP 8 8 8      Hematology Recent Labs  Lab 02/28/21 1149 03/01/21 0457  WBC 6.0 5.3  RBC 2.73* 2.58*  HGB 11.5* 10.7*  HCT 33.0* 31.2*  MCV 120.9* 120.9*  MCH 42.1* 41.5*  MCHC 34.8 34.3  RDW 13.3 13.2  PLT 269 248    BNP Recent Labs  Lab 02/28/21 1213  BNP 599.0*     DDimer No results for input(s): DDIMER in the last 168 hours.   Radiology    ECHOCARDIOGRAM COMPLETE  Result Date: 03/01/2021    ECHOCARDIOGRAM REPORT   Patient Name:   Heather Barber Date of Exam: 03/01/2021 Medical Rec #:  [id number]      Height:       63.0 in Accession #:    [id number]  Weight:       169.8 lb Date of Birth:  12-May-1946      BSA:          1.803 m Patient Age:    75 years       BP:           125/80 mmHg Patient Gender: F              HR:           127 bpm. Exam Location:  [facility] Procedure: 2D Echo, Cardiac Doppler and Color Doppler Indications:    Atrial Fibrillation I48.91  History:        Patient has prior history of Echocardiogram examinations, most                 recent 12/08/2017. Arrythmias:LBBB; Risk Factors:Hypertension,                 Dyslipidemia and Diabetes. ASD repair, GERD.  Sonographer:    Alvino Chapel RCS Referring Phys: [id number] [place]  1. Left ventricular ejection fraction, by estimation, is 30 to 35%. The left ventricle has moderately decreased function. The left ventricle demonstrates global hypokinesis. There is mild left ventricular hypertrophy. Left ventricular diastolic parameters are indeterminate.  2. Right ventricular systolic function is low normal. The right ventricular size is normal.  3. Left atrial size was moderately dilated.  4. Right atrial size was moderately dilated.  5. The mitral valve is abnormal. Moderate mitral valve regurgitation. No evidence of mitral stenosis.  6. Tricuspid valve regurgitation is moderate  to severe.  7. The aortic valve is tricuspid. There is moderate calcification of the aortic valve. There is moderate thickening of the aortic valve. Aortic valve regurgitation is not visualized. No aortic stenosis is present.  8. The inferior vena cava is dilated in size with <50% respiratory variability, suggesting right atrial pressure of 15 mmHg. FINDINGS  Left Ventricle: Left ventricular ejection fraction, by estimation, is 30 to 35%. The left ventricle has moderately decreased function. The left ventricle demonstrates global hypokinesis. The left ventricular internal cavity size was normal in size. There is mild left ventricular hypertrophy. Left ventricular diastolic parameters are indeterminate. Right Ventricle: The right ventricular size is normal. Right vetricular wall thickness was not assessed. Right ventricular systolic function is low normal. Left Atrium: Left atrial size was moderately dilated. Right Atrium: Right atrial size was moderately dilated. Pericardium: There is no evidence of pericardial effusion. Mitral Valve: The mitral valve is abnormal. There is mild thickening of the mitral valve leaflet(s). There is mild calcification of the mitral valve leaflet(s). Mild mitral annular calcification. Moderate mitral valve regurgitation. No evidence of mitral  valve stenosis. Tricuspid Valve: The tricuspid valve is normal in structure. Tricuspid valve regurgitation is moderate to severe. No evidence of tricuspid stenosis. Aortic Valve: The aortic valve is tricuspid. There is moderate calcification of the aortic valve. There is moderate thickening of the aortic valve. There is moderate aortic valve annular calcification. Aortic valve regurgitation is not visualized. No aortic stenosis is present. Aortic valve mean gradient measures 3.5 mmHg. Aortic valve peak gradient measures 8.1 mmHg. Aortic valve area, by VTI measures 1.30 cm. Pulmonic Valve: The pulmonic valve was not well visualized. Pulmonic valve  regurgitation is not visualized. No evidence of pulmonic stenosis. Aorta: The aortic root is normal in size and structure. Pulmonary Artery: Moderate to severe pulmonary HTN, PASP is 84mmHg. Venous: The inferior vena cava is dilated in size with  less than 50% respiratory variability, suggesting right atrial pressure of 15 mmHg. IAS/Shunts: No atrial level shunt detected by color flow Doppler.  LEFT VENTRICLE PLAX 2D LVIDd:         5.00 cm LVIDs:         4.10 cm LV PW:         1.20 cm LV IVS:        1.00 cm LVOT diam:     1.60 cm LV SV:         23 LV SV Index:   13 LVOT Area:     2.01 cm  RIGHT VENTRICLE TAPSE (M-mode): 1.7 cm LEFT ATRIUM             Index       RIGHT ATRIUM           Index LA diam:        4.40 cm 2.44 cm/m  RA Area:     22.70 cm LA Vol (A2C):   87.5 ml 48.52 ml/m RA Volume:   65.90 ml  36.54 ml/m LA Vol (A4C):   67.3 ml 37.32 ml/m LA Biplane Vol: 81.3 ml 45.08 ml/m  AORTIC VALVE AV Area (Vmax):    1.10 cm AV Area (Vmean):   1.15 cm AV Area (VTI):     1.30 cm AV Vmax:           142.36 cm/s AV Vmean:          84.655 cm/s AV VTI:            0.176 m AV Peak Grad:      8.1 mmHg AV Mean Grad:      3.5 mmHg LVOT Vmax:         77.60 cm/s LVOT Vmean:        48.300 cm/s LVOT VTI:          0.114 m LVOT/AV VTI ratio: 0.65  AORTA Ao Root diam: 3.20 cm MITRAL VALVE                 TRICUSPID VALVE MV Area (PHT): 6.32 cm      TR Peak grad:   44.9 mmHg MV Decel Time: 120 msec      TR Vmax:        335.00 cm/s MR Peak grad:    109.4 mmHg MR Mean grad:    69.0 mmHg   SHUNTS MR Vmax:         523.00 cm/s Systemic VTI:  0.11 m MR Vmean:        382.0 cm/s  Systemic Diam: 1.60 cm MR PISA:         1.57 cm MR PISA Eff ROA: 9 mm MR PISA Radius:  0.50 cm MV E velocity: 175.00 cm/s Carlyle Dolly MD Electronically signed by Carlyle Dolly MD Signature Date/Time: 03/01/2021/4:35:12 PM    Final     Cardiac Studies   Echocardiogram: 11/2017 - Left ventricle: Abnormal septal motion Systolic function was   normal. The estimated ejection fraction was in the range of 50%  to 55%.  - Mitral valve: Restricted posterior leaflet motion. Severely  calcified annulus. Moderately thickened leaflets . There was  moderate regurgitation.  - Left atrium: The atrium was moderately dilated.  - Atrial septum: No defect or patent foramen ovale was identified.  Echo contrast study showed no right-to-left atrial level shunt,  at baseline or with provocation.   Event Monitor: 03/2019  7 day monitor  Min HR 65,  Max HR 169, Avg HR 96  Rare ventricular ectopy all as isolated PVCs  Occasional supraventricular ectopy in the form of PACs, couplets. Fairly frequent runs of SVT up to 160 bpm  No symptoms reported  Echocardiogram: 03/01/2021 1. Left ventricular ejection fraction, by estimation, is 30 to 35%. The  left ventricle has moderately decreased function. The left ventricle  demonstrates global hypokinesis. There is mild left ventricular  hypertrophy. Left ventricular diastolic  parameters are indeterminate.  2. Right ventricular systolic function is low normal. The right  ventricular size is normal.  3. Left atrial size was moderately dilated.  4. Right atrial size was moderately dilated.  5. The mitral valve is abnormal. Moderate mitral valve regurgitation. No  evidence of mitral stenosis.  6. Tricuspid valve regurgitation is moderate to severe.  7. The aortic valve is tricuspid. There is moderate calcification of the  aortic valve. There is moderate thickening of the aortic valve. Aortic  valve regurgitation is not visualized. No aortic stenosis is present.  8. The inferior vena cava is dilated in size with <50% respiratory  variability, suggesting right atrial pressure of 15 mmHg.    Patient Profile     75 y.o. female  with past medical history of palpitations (prior monitor showing SVT), reported history of VT by prior notes and normal cors by cath in 1994 and 2011),  LBBB, ASD (s/p repair in 1994), HTN, HLD and history of endometrial cancer who is being seen today for the evaluation of new cardiomyopathy and atrial fibrillation at the request of Dr. Manuella Ghazi.  Assessment & Plan    1. Atrial fibrillation with RVR Previously documented with SVT on heart monitor. New diagnosis of AFIB on this admission. CHA2DS2-VASc Score of 5 (CHF, HTN, Vascular, Female, Age). Today had an episode of rapid rate (140s) when she attempted to ambulate down the hall.   - Continue Toprol XL - Continue Eliquis - NPO after midnight - Plan on TEE/DCCV tomorrow (03/04/21)   2. Acute systolic heart failure (? tachycardia mediated) BNP was 599. Echo showed EF of 30-35%  - Losartan 25 mg daily - Metoprolol succinate - Diuretics as needed - Repeat echo in 3 months   For questions or updates, please contact [place] Please consult [email or web address] for contact info under        Signed, Meade Maw, MD  03/03/2021, 2:11 PM

## 2021-03-03 NOTE — Progress Notes (Signed)
   03/03/21 1715  Vitals  Pulse Rate (!) 133  Pulse Rate Source Radial (manually counted matched monitor)  MEWS COLOR  MEWS Score Color Yellow  MEWS Score  MEWS Temp 0  MEWS Systolic 0  MEWS Pulse 3  MEWS RR 0  MEWS LOC 0  MEWS Score 3  Metoprolol 5mg  IV push given at this time, rate decreased to the 120s. MD Manuella Ghazi made aware via secure chat. Awaiting new orders.

## 2021-03-04 ENCOUNTER — Inpatient Hospital Stay (HOSPITAL_COMMUNITY): Payer: Medicare Other | Admitting: Certified Registered"

## 2021-03-04 ENCOUNTER — Encounter (HOSPITAL_COMMUNITY)
Admission: EM | Disposition: A | Discharge status: Home / Self Care | Payer: Self-pay | Source: Home / Self Care | Attending: Internal Medicine

## 2021-03-04 ENCOUNTER — Inpatient Hospital Stay (HOSPITAL_COMMUNITY): Payer: Medicare Other

## 2021-03-04 DIAGNOSIS — I340 Nonrheumatic mitral (valve) insufficiency: Secondary | ICD-10-CM

## 2021-03-04 DIAGNOSIS — I361 Nonrheumatic tricuspid (valve) insufficiency: Secondary | ICD-10-CM

## 2021-03-04 DIAGNOSIS — I4891 Unspecified atrial fibrillation: Secondary | ICD-10-CM

## 2021-03-04 HISTORY — PX: CARDIOVERSION: SHX1299

## 2021-03-04 HISTORY — PX: TEE WITHOUT CARDIOVERSION: SHX5443

## 2021-03-04 LAB — ECHO TEE
AV Mean grad: 4 mmHg
AV Peak grad: 6.6 mmHg
Ao pk vel: 1.28 m/s

## 2021-03-04 LAB — BASIC METABOLIC PANEL
Anion gap: 8 (ref 5–15)
BUN: 22 mg/dL (ref 8–23)
CO2: 28 mmol/L (ref 22–32)
Calcium: 9.2 mg/dL (ref 8.9–10.3)
Chloride: 102 mmol/L (ref 98–111)
Creatinine, Ser: 0.89 mg/dL (ref 0.44–1.00)
GFR, Estimated: >60 mL/min (ref >60)
Glucose, Bld: 131 mg/dL — ABNORMAL HIGH (ref 70–99)
Potassium: 3.9 mmol/L (ref 3.5–5.1)
Sodium: 138 mmol/L (ref 135–145)

## 2021-03-04 LAB — MAGNESIUM: Magnesium: 1.7 mg/dL (ref 1.7–2.4)

## 2021-03-04 LAB — GLUCOSE, CAPILLARY: Glucose-Capillary: 167 mg/dL — ABNORMAL HIGH (ref 70–99)

## 2021-03-04 LAB — SURGICAL PCR SCREEN
MRSA, PCR: NEGATIVE
Staphylococcus aureus: NEGATIVE

## 2021-03-04 SURGERY — CARDIOVERSION
Anesthesia: General

## 2021-03-04 MED ORDER — PROPOFOL 10 MG/ML IV BOLUS
INTRAVENOUS | Status: AC
  Filled 2021-03-04: qty 60

## 2021-03-04 MED ORDER — AMIODARONE LOAD VIA INFUSION
150.0000 mg | Freq: Once | INTRAVENOUS | Status: AC
  Administered 2021-03-04: 150 mg via INTRAVENOUS
  Filled 2021-03-04: qty 83.34

## 2021-03-04 MED ORDER — CHLORHEXIDINE GLUCONATE CLOTH 2 % EX PADS
6.0000 | MEDICATED_PAD | Freq: Every day | CUTANEOUS | Status: DC
  Administered 2021-03-04 – 2021-03-05 (×2): 6 via TOPICAL

## 2021-03-04 MED ORDER — AMIODARONE HCL IN DEXTROSE 360-4.14 MG/200ML-% IV SOLN
30.0000 mg/h | INTRAVENOUS | Status: DC
  Administered 2021-03-04: 30 mg/h via INTRAVENOUS
  Filled 2021-03-04 (×3): qty 200

## 2021-03-04 MED ORDER — BUTAMBEN-TETRACAINE-BENZOCAINE 2-2-14 % EX AERO
INHALATION_SPRAY | CUTANEOUS | Status: AC
  Filled 2021-03-04: qty 5

## 2021-03-04 MED ORDER — LACTATED RINGERS IV SOLN: INTRAVENOUS | Status: DC | PRN

## 2021-03-04 MED ORDER — PROPOFOL 500 MG/50ML IV EMUL
INTRAVENOUS | Status: DC | PRN
  Administered 2021-03-04: 100 ug/kg/min via INTRAVENOUS

## 2021-03-04 MED ORDER — LIDOCAINE HCL (CARDIAC) PF 100 MG/5ML IV SOSY
PREFILLED_SYRINGE | INTRAVENOUS | Status: DC | PRN
  Administered 2021-03-04: 50 mg via INTRATRACHEAL

## 2021-03-04 MED ORDER — ORAL CARE MOUTH RINSE: 15.0000 mL | Freq: Once | OROMUCOSAL | Status: DC

## 2021-03-04 MED ORDER — PROPOFOL 10 MG/ML IV BOLUS
INTRAVENOUS | Status: DC | PRN
  Administered 2021-03-04: 40 mg via INTRAVENOUS

## 2021-03-04 MED ORDER — CHLORHEXIDINE GLUCONATE 0.12 % MT SOLN: 15.0000 mL | Freq: Once | OROMUCOSAL | Status: DC

## 2021-03-04 MED ORDER — LACTATED RINGERS IV SOLN: INTRAVENOUS | Status: DC

## 2021-03-04 MED ORDER — MUPIROCIN 2 % EX OINT
1.0000 "application " | TOPICAL_OINTMENT | Freq: Two times a day (BID) | CUTANEOUS | Status: DC
  Administered 2021-03-04 – 2021-03-05 (×2): 1 via NASAL
  Filled 2021-03-04: qty 22

## 2021-03-04 MED ORDER — AMIODARONE HCL IN DEXTROSE 360-4.14 MG/200ML-% IV SOLN
60.0000 mg/h | INTRAVENOUS | Status: AC
  Administered 2021-03-04 (×2): 60 mg/h via INTRAVENOUS
  Filled 2021-03-04 (×2): qty 200

## 2021-03-04 NOTE — Progress Notes (Signed)
PROGRESS NOTE    Heather Barber  IRS:[id number] DOB: 08/18/1946 DOA: 02/28/2021 PCP: Dettinger, Fransisca Kaufmann, MD   Brief Narrative:   Heather Frieden Hyltonis a 75 y.o.femalewith medical history significant forLBBB, ventricular tachycardia, hypertension, diabetes mellitus, endometrial cancer. Patient presented to the ED with complaints of chest pain and difficulty breathing that started last night when was lying down to sleep.Patient was admitted with new onset atrial fibrillation.  She continues to have significant RVR with ambulation and has undergone TEE/cardioversion this a.m., but has returned back into atrial fibrillation/flutter.  She will require IV amiodarone drip and closer monitoring.  Assessment & Plan:   Principal Problem:   New onset atrial fibrillation [facility]) Active Problems:   Thrombocytosis   Type 2 diabetes mellitus with other specified complication ([name])   Atrial fibrillation with RVR ([name])   Symptomatic, new onset atrial fibrillationwith RVR -Started on Eliquis given CHA2DS2-VASc score 4 -Increased home metoprolol from 50 mg twice daily to 100 mg twice daily, but continues to have significant RVR with ambulation -Cardiology tried TEE/Cardioversion 5/6, but afib/flutter has returned. Amiodarone drip will be started and pt to be monitored in SDU -Updated echocardiogramwith findings of new cardiomyopathy and reduced LVEF of 30-35% -TSH 3.423 -No electrolyte abnormalities -Continues to have heart rate elevationthatwill require ongoing monitoring  Acute systolic CHF exacerbation -Lasix dosing as ordered by cardiology, now discontinued -May have new tachycardia induced cardiomyopathy with plans to repeat 2D echocardiogram in 3 months -Ischemic testing is deferred at this time  Hypertension-stable -Continue current medications with metoprolol dose increasedand diuresis with Lasix  Diabetes mellitus -BloodGlucose levels are stable -Hemoglobin A1c 6.7% on  11/2020  Old LBBB -Continue beta-blockerwith increased dose as noted above  Thrombocytosis -Hydroxyurearesumed -Follows with Dr. Delton Coombes -Follow am CBC   DVT prophylaxis:Eliquis Code Status:Full Family Communication:Discussed with daughter at bedside5/5 Disposition Plan: Status is: Inpatient  Remains inpatient appropriate because:Hemodynamically unstable, Ongoing diagnostic testing needed not appropriate for outpatient work up, IV treatments appropriate due to intensity of illness or inability to take PO and Inpatient level of care appropriate due to severity of illness   Dispo: The patient is from:Home Anticipated d/c is KK:[facility] Patient currently is not medically stable to d/c. Difficult to place patient No  Consultants:  Cardiology  Procedures:  See below  Antimicrobials:  None  Subjective: Patient seen and evaluated today and has undergone attempted cardioversion on this morning, but has returned back into atrial fibrillation/flutter.  Objective: Vitals:   03/04/21 1130 03/04/21 1141 03/04/21 1215 03/04/21 1245  BP: (!) 142/96 (!) 119/58 (!) 89/58 110/67  Pulse: (!) 123  77 90  Resp: 17 18 17  (!) 23  Temp:  98 F (36.7 C)  98 F (36.7 C)  TempSrc:      SpO2: 99% 94% 99% 100%  Weight:      Height:        Intake/Output Summary (Last 24 hours) at 03/04/2021 1255 Last data filed at 03/04/2021 1147 Gross per 24 hour  Intake 630 ml  Output 4 ml  Net 626 ml   Filed Weights   03/01/21 0500 03/03/21 0500 03/04/21 0500  Weight: 77 kg 77.1 kg 77.4 kg    Examination:  General exam: Appears calm and comfortable  Respiratory system: Clear to auscultation. Respiratory effort normal. Cardiovascular system: S1 & S2 heard, irregular. Gastrointestinal system: Abdomen is soft Central nervous system: Alert and awake Extremities: No edema Skin: No significant lesions noted Psychiatry: Flat  affect.    Data Reviewed: I  have personally reviewed following labs and imaging studies  CBC: Recent Labs  Lab 02/28/21 1149 03/01/21 0457  WBC 6.0 5.3  HGB 11.5* 10.7*  HCT 33.0* 31.2*  MCV 120.9* 120.9*  PLT 269 161   Basic Metabolic Panel: Recent Labs  Lab 02/28/21 1213 03/01/21 0457 03/02/21 0434 03/04/21 0544  NA 140 141 139 138  K 3.8 3.6 3.9 3.9  CL 105 105 103 102  CO2 27 28 28 28   GLUCOSE 136* 125* 128* 131*  BUN 18 17 16 22   CREATININE 0.77 0.69 0.79 0.89  CALCIUM 8.9 8.9 9.0 9.2  MG 1.7  --  1.7 1.7   GFR: Estimated Creatinine Clearance: 54.6 mL/min (by C-G formula based on SCr of 0.89 mg/dL). Liver Function Tests: Recent Labs  Lab 02/28/21 1213  AST 17  ALT 23  ALKPHOS 130*  BILITOT 0.8  PROT 7.0  ALBUMIN 4.0   No results for input(s): LIPASE, AMYLASE in the last 168 hours. No results for input(s): AMMONIA in the last 168 hours. Coagulation Profile: No results for input(s): INR, PROTIME in the last 168 hours. Cardiac Enzymes: No results for input(s): CKTOTAL, CKMB, CKMBINDEX, TROPONINI in the last 168 hours. BNP (last 3 results) No results for input(s): PROBNP in the last 8760 hours. HbA1C: No results for input(s): HGBA1C in the last 72 hours. CBG: No results for input(s): GLUCAP in the last 168 hours. Lipid Profile: No results for input(s): CHOL, HDL, LDLCALC, TRIG, CHOLHDL, LDLDIRECT in the last 72 hours. Thyroid Function Tests: No results for input(s): TSH, T4TOTAL, FREET4, T3FREE, THYROIDAB in the last 72 hours. Anemia Panel: No results for input(s): VITAMINB12, FOLATE, FERRITIN, TIBC, IRON, RETICCTPCT in the last 72 hours. Sepsis Labs: No results for input(s): PROCALCITON, LATICACIDVEN in the last 168 hours.  Recent Results (from the past 240 hour(s))  Resp Panel by RT-PCR (Flu A&B, Covid) Nasopharyngeal Swab     Status: None   Collection Time: 02/28/21 12:56 PM   Specimen: Nasopharyngeal Swab; Nasopharyngeal(NP) swabs in vial  transport medium  Result Value Ref Range Status   SARS Coronavirus 2 by RT PCR NEGATIVE NEGATIVE Final    Comment: (NOTE) SARS-CoV-2 target nucleic acids are NOT DETECTED.  The SARS-CoV-2 RNA is generally detectable in upper respiratory specimens during the acute phase of infection. The lowest concentration of SARS-CoV-2 viral copies this assay can detect is 138 copies/mL. A negative result does not preclude SARS-Cov-2 infection and should not be used as the sole basis for treatment or other patient management decisions. A negative result may occur with  improper specimen collection/handling, submission of specimen other than nasopharyngeal swab, presence of viral mutation(s) within the areas targeted by this assay, and inadequate number of viral copies(<138 copies/mL). A negative result must be combined with clinical observations, patient history, and epidemiological information. The expected result is Negative.  Fact Sheet for Patients:  EntrepreneurPulse.com.au  Fact Sheet for Healthcare Providers:  IncredibleEmployment.be  This test is no t yet approved or cleared by the [place] FDA and  has been authorized for detection and/or diagnosis of SARS-CoV-2 by FDA under an Emergency Use Authorization (EUA). This EUA will remain  in effect (meaning this test can be used) for the duration of the COVID-19 declaration under Section 564(b)(1) of the Act, 21 U.S.C.section 360bbb-3(b)(1), unless the authorization is terminated  or revoked sooner.       Influenza A by PCR NEGATIVE NEGATIVE Final   Influenza B by PCR NEGATIVE NEGATIVE Final    Comment: (  NOTE) The Xpert Xpress SARS-CoV-2/FLU/RSV plus assay is intended as an aid in the diagnosis of influenza from Nasopharyngeal swab specimens and should not be used as a sole basis for treatment. Nasal washings and aspirates are unacceptable for Xpert Xpress SARS-CoV-2/FLU/RSV testing.  Fact  Sheet for Patients: EntrepreneurPulse.com.au  Fact Sheet for Healthcare Providers: IncredibleEmployment.be  This test is not yet approved or cleared by the [place] FDA and has been authorized for detection and/or diagnosis of SARS-CoV-2 by FDA under an Emergency Use Authorization (EUA). This EUA will remain in effect (meaning this test can be used) for the duration of the COVID-19 declaration under Section 564(b)(1) of the Act, 21 U.S.C. section 360bbb-3(b)(1), unless the authorization is terminated or revoked.  Performed at [facility], [address], [place], [place] [address]   Surgical PCR screen     Status: None   Collection Time: 03/04/21  7:18 AM   Specimen: Nasal Mucosa; Nasal Swab  Result Value Ref Range Status   MRSA, PCR NEGATIVE NEGATIVE Final   Staphylococcus aureus NEGATIVE NEGATIVE Final    Comment: (NOTE) The Xpert SA Assay (FDA approved for NASAL specimens in patients 76 years of age and older), is one component of a comprehensive surveillance program. It is not intended to diagnose infection nor to guide or monitor treatment. Performed at [facility], [address], [place], [place] [address]          Radiology Studies: No results found.      Scheduled Meds: . amiodarone  150 mg Intravenous Once  . [MAR Hold] apixaban  5 mg Oral BID  . [MAR Hold] atorvastatin  20 mg Oral QHS  . chlorhexidine  15 mL Mouth/Throat Once   Or  . mouth rinse  15 mL Mouth Rinse Once  . [MAR Hold] fenofibrate  160 mg Oral Daily  . [MAR Hold] hydroxyurea  500 mg Oral BID  . [MAR Hold] losartan  25 mg Oral Daily  . [MAR Hold] metoprolol succinate  100 mg Oral BID  . [MAR Hold] mupirocin ointment  1 application Nasal BID  . [MAR Hold] pantoprazole  40 mg Oral Daily   Continuous Infusions: . amiodarone     Followed by  . amiodarone    . lactated ringers       LOS: 3 days    Time spent: 35 minutes    Karita Dralle Darleen Crocker,  DO [facility]  If 7PM-7AM, please contact night-coverage [email or web address] 03/04/2021, 12:55 PM

## 2021-03-04 NOTE — Progress Notes (Signed)
  Echocardiogram Echocardiogram Transesophageal has been performed.  Heather Barber 03/04/2021, 12:10 PM

## 2021-03-04 NOTE — Care Management Important Message (Signed)
Important Message  Patient Details  Name: Heather Barber MRN: [id number] Date of Birth: [birth year]   Medicare Important Message Given:  Yes     Tommy Medal 03/04/2021, 11:28 AM

## 2021-03-04 NOTE — Transfer of Care (Signed)
Immediate Anesthesia Transfer of Care Note  Patient: Heather Barber  Procedure(s) Performed: CARDIOVERSION (N/A ) TRANSESOPHAGEAL ECHOCARDIOGRAM (TEE) (N/A )  Patient Location: PACU  Anesthesia Type:General  Level of Consciousness: awake  Airway & Oxygen Therapy: Patient Spontanous Breathing and Patient connected to nasal cannula oxygen  Post-op Assessment: Report given to RN and Post -op Vital signs reviewed and stable  Post vital signs: Reviewed and stable  Last Vitals:  Vitals Value Taken Time  BP    Temp    Pulse    Resp    SpO2      Last Pain:  Vitals:   03/04/21 1008  TempSrc:   PainSc: 0-No pain      Patients Stated Pain Goal: 0 (78/67/54 4920)  Complications: No complications documented.

## 2021-03-04 NOTE — CV Procedure (Signed)
CV Procedure Note  Procedure: Transesophageal echocardiogram/Electrical cardioversion Physician: Dr Maryann Alar Indication:afib/aflutter   Patient was brought to the procedure suite after appropriate consent was obtained. Sedation was achieved with the assistance of anesthsiology, for details please refer to there documenation. Patient was placed in the left lateral decubitus position, bite block placed. TEE probe intubated into esophagus without difficulty and several images obtained. There was no evidence of intracardiac thrombus on TEE, please see full report for details  TEE probe was removed. Defib pads had been placed in the anterior and posterior postions. She was succesfullly cardioverted with a single synchronized 200j shock to SR with first degree AV block. Cardiopulmonary monitoring was performed throughout the procedure, she tolerated well without complications.   Carlyle Dolly MD

## 2021-03-04 NOTE — Progress Notes (Signed)
Progress Note  Patient Name: Heather Barber Date of Encounter: 03/04/2021  [facility] Cardiologist: Carlyle Dolly, MD   Subjective   Some palpitations overnight  Inpatient Medications    Scheduled Meds: . apixaban  5 mg Oral BID  . atorvastatin  20 mg Oral QHS  . fenofibrate  160 mg Oral Daily  . hydroxyurea  500 mg Oral BID  . losartan  25 mg Oral Daily  . metoprolol succinate  100 mg Oral BID  . mupirocin ointment  1 application Nasal BID  . pantoprazole  40 mg Oral Daily   Continuous Infusions:  PRN Meds: acetaminophen **OR** acetaminophen, metoprolol tartrate, ondansetron **OR** ondansetron (ZOFRAN) IV   Vital Signs    Vitals:   03/03/21 1720 03/03/21 2019 03/04/21 0500 03/04/21 0516  BP: 122/73 (!) 108/53  100/60  Pulse: 100 77  81  Resp:  18  20  Temp:  97.6 F (36.4 C)  98.1 F (36.7 C)  TempSrc:      SpO2:  95%  98%  Weight:   77.4 kg   Height:        Intake/Output Summary (Last 24 hours) at 03/04/2021 1024 Last data filed at 03/03/2021 1700 Gross per 24 hour  Intake 430 ml  Output 4 ml  Net 426 ml   Last 3 Weights 03/04/2021 03/03/2021 03/01/2021  Weight (lbs) 170 lb 10.2 oz 169 lb 15.6 oz 169 lb 12.1 oz  Weight (kg) 77.4 kg 77.1 kg 77 kg      Telemetry    afib RVR - Personally Reviewed  ECG    n/a- Personally Reviewed  Physical Exam   GEN: No acute distress.   Neck: No JVD Cardiac: irreg, tachy Respiratory: Clear to auscultation bilaterally. GI: Soft, nontender, non-distended  MS: No edema; No deformity. Neuro:  Nonfocal  Psych: Normal affect   Labs    High Sensitivity Troponin:   Recent Labs  Lab 02/28/21 1149 02/28/21 1357  TROPONINIHS 12 12      Chemistry Recent Labs  Lab 02/28/21 1213 03/01/21 0457 03/02/21 0434 03/04/21 0544  NA 140 141 139 138  K 3.8 3.6 3.9 3.9  CL 105 105 103 102  CO2 27 28 28 28   GLUCOSE 136* 125* 128* 131*  BUN 18 17 16 22   CREATININE 0.77 0.69 0.79 0.89  CALCIUM 8.9 8.9 9.0 9.2   PROT 7.0  --   --   --   ALBUMIN 4.0  --   --   --   AST 17  --   --   --   ALT 23  --   --   --   ALKPHOS 130*  --   --   --   BILITOT 0.8  --   --   --   GFRNONAA >60 >60 >60 >60  ANIONGAP 8 8 8 8      Hematology Recent Labs  Lab 02/28/21 1149 03/01/21 0457  WBC 6.0 5.3  RBC 2.73* 2.58*  HGB 11.5* 10.7*  HCT 33.0* 31.2*  MCV 120.9* 120.9*  MCH 42.1* 41.5*  MCHC 34.8 34.3  RDW 13.3 13.2  PLT 269 248    BNP Recent Labs  Lab 02/28/21 1213  BNP 599.0*     DDimer No results for input(s): DDIMER in the last 168 hours.   Radiology    No results found.  Cardiac Studies     Patient Profile     Heather Barber is a 75 y.o. female with  past medical history of palpitations (prior monitor showing SVT), reported history of VT by prior notes and normal cors by cath in 1994 and 2011), LBBB, ASD (s/p repair in 1994), HTN, HLD and history of endometrial cancer who is being seen   Assessment & Plan    1. Afib with RVR/vs atypical flutter with variable conduction - new diagnosis this admission - Unknown duration but patient's family reports a change in her dyspnea and palpitations over the past 3-4 weeks.  - have titrated toprol however rates remain elevated. Avoiding CCB given systolic dysfunction - plan for TEE/DCCV in setting of failed rate control in newly diagnosed Afib combined with systolic dysfunction possibly tachy mediated CM - continue eliquis  - if fail to restore SR with DCCV or recurrence after would start amiodarone IV load and convert to oral.   2. Acute systolic HF - - BNP was elevated to 599 on admission and CTA showed bilateral effusions. Repeat echo shows a reduced EF of 30-35% with global HK and mild LVH. RV function was low-normal and she had moderate biatrial dilation, moderate MR and moderate to severe TR.   - medical therapy with toprol 100mg  bid, losartan 25mg  daily. We had to aggressively titrate her beta blocker due to afib with RVR, soft bp's  have limited converting to entresto or starting aldctone. Can consider farxiga as outpatient.  - would hold on cath at this time as very likely tachy mediated CM, optimize medical therapy and repeat echo 3 months, if ongoing dysfunction consider cath at that time.    For questions or updates, please contact [facility] Please consult [email or web address] for contact info under        Signed, Carlyle Dolly, MD  03/04/2021, 10:24 AM

## 2021-03-04 NOTE — Progress Notes (Signed)
Succesful cardioversion, however later in the afternoon patient with recurrent afib/aflutter. Would recommend starting on IV amiodarone drip. If converts and stable at 24 hours would d/c on amio 400mg  bid x 7 days, then 200mg  bid x 2 weeks, then 200mg  daily. If does not convert on IV amio would consider repeat attempt at [facility] Monday   Carlyle Dolly MD

## 2021-03-04 NOTE — Progress Notes (Signed)
Electrical Cardioversion Procedure Note Heather Barber [id number] Jan 26, 1946  Procedure: Electrical Cardioversion Indications:  Atrial Flutter  Procedure Details Consent: Risks of procedure as well as the alternatives and risks of each were explained to the (patient/caregiver).  Consent for procedure obtained. Time Out: Verified patient identification, verified procedure, site/side was marked, verified correct patient position, special equipment/implants available, medications/allergies/relevent history reviewed, required imaging and test results available.  Performed  Patient placed on cardiac monitor, pulse oximetry, supplemental oxygen as necessary.  Sedation given: propofol administered by Heather Lloyd, CRNA Pacer pads placed anterior and posterior chest.  Cardioverted 1 time(Barber).  Cardioverted at [place].  Evaluation Findings: Post procedure EKG shows: first degree AV Block  Complications: None Patient did tolerate procedure well.   Heather Barber 03/04/2021, 11:52 AM

## 2021-03-04 NOTE — Anesthesia Preprocedure Evaluation (Signed)
Anesthesia Evaluation  Patient identified by MRN, date of birth, ID band Patient awake    Reviewed: Allergy & Precautions, NPO status , Patient's Chart, lab work & pertinent test results  History of Anesthesia Complications (+) PONV and history of anesthetic complications  Airway Mallampati: II  TM Distance: >3 FB Neck ROM: Full    Dental  (+) Dental Advisory Given   Pulmonary neg pulmonary ROS,    Pulmonary exam normal breath sounds clear to auscultation       Cardiovascular hypertension, Pt. on medications + dysrhythmias  Rhythm:Irregular Rate:Normal  ASD 1. Left ventricular ejection fraction, by estimation, is 30 to 35%. The left ventricle has moderately decreased function. The left ventricle demonstrates global hypokinesis. There is mild left ventricular  hypertrophy. Left ventricular diastolic parameters are indeterminate.  2. Right ventricular systolic function is low normal. The right ventricular size is normal.  3. Left atrial size was moderately dilated.  4. Right atrial size was moderately dilated.  5. The mitral valve is abnormal. Moderate mitral valve regurgitation. No evidence of mitral stenosis.  6. Tricuspid valve regurgitation is moderate to severe.  7. The aortic valve is tricuspid. There is moderate calcification of the aortic valve. There is moderate thickening of the aortic valve. Aortic valve regurgitation is not visualized. No aortic stenosis is present.  8. The inferior vena cava is dilated in size with <50% respiratory variability, suggesting right atrial pressure of 15 mmHg.    Neuro/Psych Anxiety negative neurological ROS  negative psych ROS   GI/Hepatic Neg liver ROS, GERD  Medicated and Controlled,  Endo/Other  diabetes, Well Controlled, Type 2, Oral Hypoglycemic Agents  Renal/GU negative Renal ROS     Musculoskeletal negative musculoskeletal ROS (+)   Abdominal   Peds   Hematology negative hematology ROS (+)   Anesthesia Other Findings   Reproductive/Obstetrics negative OB ROS                             Anesthesia Physical Anesthesia Plan  ASA: IV  Anesthesia Plan: General   Post-op Pain Management:    Induction: Intravenous  PONV Risk Score and Plan: Propofol infusion  Airway Management Planned: Natural Airway and Nasal Cannula  Additional Equipment:   Intra-op Plan:   Post-operative Plan:   Informed Consent: I have reviewed the patients History and Physical, chart, labs and discussed the procedure including the risks, benefits and alternatives for the proposed anesthesia with the patient or authorized representative who has indicated his/her understanding and acceptance.     Dental advisory given  Plan Discussed with: CRNA and Surgeon  Anesthesia Plan Comments:        Anesthesia Quick Evaluation

## 2021-03-04 NOTE — Anesthesia Postprocedure Evaluation (Signed)
Anesthesia Post Note  Patient: Heather Barber  Procedure(s) Performed: CARDIOVERSION (N/A ) TRANSESOPHAGEAL ECHOCARDIOGRAM (TEE) (N/A )  Patient location during evaluation: PACU Anesthesia Type: General Level of consciousness: awake and alert and oriented Pain management: pain level controlled Vital Signs Assessment: post-procedure vital signs reviewed and stable Respiratory status: spontaneous breathing and respiratory function stable Cardiovascular status: blood pressure returned to baseline and stable Postop Assessment: no apparent nausea or vomiting Anesthetic complications: no   No complications documented.   Last Vitals:  Vitals:   03/04/21 1420 03/04/21 1445  BP: 123/80 113/79  Pulse:  71  Resp: 16 (!) 22  Temp:    SpO2: 97% 96%    Last Pain:  Vitals:   03/04/21 1405  TempSrc:   PainSc: 0-No pain                 Nazli Penn C Alane Hanssen

## 2021-03-05 LAB — CBC
HCT: 35.7 % — ABNORMAL LOW (ref 36.0–46.0)
Hemoglobin: 12.3 g/dL (ref 12.0–15.0)
MCH: 41 pg — ABNORMAL HIGH (ref 26.0–34.0)
MCHC: 34.5 g/dL (ref 30.0–36.0)
MCV: 119 fL — ABNORMAL HIGH (ref 80.0–100.0)
Platelets: 296 10*3/uL (ref 150–400)
RBC: 3 MIL/uL — ABNORMAL LOW (ref 3.87–5.11)
RDW: 12.5 % (ref 11.5–15.5)
WBC: 5.5 10*3/uL (ref 4.0–10.5)
nRBC: 0 % (ref 0.0–0.2)

## 2021-03-05 LAB — BASIC METABOLIC PANEL
Anion gap: 11 (ref 5–15)
BUN: 20 mg/dL (ref 8–23)
CO2: 26 mmol/L (ref 22–32)
Calcium: 9.4 mg/dL (ref 8.9–10.3)
Chloride: 103 mmol/L (ref 98–111)
Creatinine, Ser: 0.8 mg/dL (ref 0.44–1.00)
GFR, Estimated: >60 mL/min (ref >60)
Glucose, Bld: 122 mg/dL — ABNORMAL HIGH (ref 70–99)
Potassium: 3.6 mmol/L (ref 3.5–5.1)
Sodium: 140 mmol/L (ref 135–145)

## 2021-03-05 LAB — GLUCOSE, CAPILLARY: Glucose-Capillary: 134 mg/dL — ABNORMAL HIGH (ref 70–99)

## 2021-03-05 LAB — MAGNESIUM: Magnesium: 1.8 mg/dL (ref 1.7–2.4)

## 2021-03-05 MED ORDER — METOPROLOL SUCCINATE ER 100 MG PO TB24: 100.0000 mg | ORAL_TABLET | Freq: Two times a day (BID) | ORAL | 1 refills | Status: DC

## 2021-03-05 MED ORDER — APIXABAN 5 MG PO TABS: 5.0000 mg | ORAL_TABLET | Freq: Two times a day (BID) | ORAL | 3 refills | Status: DC

## 2021-03-05 MED ORDER — LOSARTAN POTASSIUM 25 MG PO TABS: 25.0000 mg | ORAL_TABLET | Freq: Every day | ORAL | 1 refills | Status: DC

## 2021-03-05 MED ORDER — AMIODARONE HCL 200 MG PO TABS: ORAL_TABLET | ORAL | 3 refills | Status: DC

## 2021-03-05 MED ORDER — FENOFIBRATE 160 MG PO TABS: 160.0000 mg | ORAL_TABLET | Freq: Every day | ORAL | 0 refills | Status: DC

## 2021-03-05 MED ORDER — AMIODARONE HCL 200 MG PO TABS
400.0000 mg | ORAL_TABLET | Freq: Once | ORAL | Status: AC
  Administered 2021-03-05: 400 mg via ORAL
  Filled 2021-03-05: qty 2

## 2021-03-05 NOTE — Discharge Summary (Signed)
Physician Discharge Summary  Heather Barber [id number] DOB: 05/10/1946 DOA: 02/28/2021  PCP: Dettinger, Fransisca Kaufmann, MD  Admit date: 02/28/2021  Discharge date: 03/05/2021  Admitted From:Home  Disposition:  Home  Recommendations for Outpatient Follow-up:  1. Follow up with PCP in 1-2 weeks 2. Follow-up with cardiology as scheduled on 5/20 at 1:30 PM 3. Continue on amiodarone as prescribed below 4. Continue Eliquis twice daily for anticoagulation 5. Other medication adjustments with increased dose of metoprolol and decrease dose of losartan as noted below  Home Health: None  Equipment/Devices: None  Discharge Condition:Stable  CODE STATUS: Full  Diet recommendation: Heart Healthy/carb modified  Brief/Interim Summary:  Heather Borrayo Hyltonis a 75 y.o.femalewith medical history significant forLBBB, ventricular tachycardia, hypertension, diabetes mellitus, endometrial cancer. Patient presented to the ED with complaints of chest pain and difficulty breathing that started the night before admission when was lying down to sleep.Patient was admitted with new onset atrial fibrillation.She continued to have significant RVR with ambulation and had undergone TEE/cardioversion on 5/6, but had returned back into atrial fibrillation/flutter.  She was then placed on IV amiodarone drip and is now in SR on the morning of discharge. She will now be converted to oral amiodarone and discharged to follow up with Cardiology as scheduled above.   Discharge Diagnoses:  Principal Problem:   New onset atrial fibrillation [facility]) Active Problems:   Thrombocytosis   Type 2 diabetes mellitus with other specified complication ([name])   Atrial fibrillation with RVR ([place])  Principal discharge diagnosis: Symptomatic, new onset atrial fibrillation with RVR along with acute systolic CHF exacerbation.  Discharge Instructions  Discharge Instructions    Diet - low sodium heart healthy   Complete by: As directed     Increase activity slowly   Complete by: As directed      Allergies as of 03/05/2021      Reactions   Codeine Nausea And Vomiting   Morphine Other (See Comments)   GI symptoms      Medication List    TAKE these medications   amiodarone 200 MG tablet Commonly known as: Pacerone Take 400mg  by mouth twice daily for seven days, followed by 200mg  by mouth twice daily for fourteen days, followed by 200mg  by mouth daily thereafter.   apixaban 5 MG Tabs tablet Commonly known as: ELIQUIS Take 1 tablet (5 mg total) by mouth 2 (two) times daily.   aspirin EC 81 MG tablet Take 81 mg by mouth daily.   atorvastatin 20 MG tablet Commonly known as: LIPITOR Take 1 tablet (20 mg total) by mouth at bedtime.   fenofibrate 160 MG tablet Take 1 tablet (160 mg total) by mouth daily. Start taking on: Mar 06, 2021 What changed:   medication strength  how much to take   furosemide 20 MG tablet Commonly known as: LASIX TAKE 1 TABLET BY MOUTH EVERY DAY   hydroxyurea 500 MG capsule Commonly known as: HYDREA TAKE 1 CAPSULE BY MOUTH TWICE A DAY   ibuprofen 600 MG tablet Commonly known as: ADVIL Take 1 tablet (600 mg total) by mouth every 6 (six) hours as needed for moderate pain. For AFTER surgery, use sparingly What changed: when to take this   ketoconazole 2 % cream Commonly known as: NIZORAL Apply 1 application topically 2 (two) times daily. What changed:   when to take this  reasons to take this   losartan 25 MG tablet Commonly known as: COZAAR Take 1 tablet (25 mg total) by mouth daily. Start taking  on: Mar 06, 2021 What changed:   medication strength  how much to take   metoprolol succinate 100 MG 24 hr tablet Commonly known as: TOPROL-XL Take 1 tablet (100 mg total) by mouth 2 (two) times daily. Take with or immediately following a meal. What changed:   medication strength  how much to take  when to take this   Multivitamin Women 50+ Tabs Take by mouth daily.    naproxen sodium 220 MG tablet Commonly known as: ALEVE Take 220 mg by mouth as needed.   omeprazole 20 MG capsule Commonly known as: PRILOSEC Take 1 capsule (20 mg total) by mouth daily.       Follow-up Information    Charlie Pitter, PA-C Follow up on 03/18/2021.   Specialties: Cardiology, Radiology Why: Cardiology Hospital Follow-up on 03/18/2021 at 1:30 PM with Melina Copa, PA (works with Dr. Harl Bowie).  Contact information: [place] [id number] [phone]              Allergies  Allergen Reactions  . Codeine Nausea And Vomiting  . Morphine Other (See Comments)    GI symptoms    Consultations:  Cardiology   Procedures/Studies: DG Chest 2 View  Result Date: 02/28/2021 CLINICAL DATA:  Short of breath EXAM: CHEST - 2 VIEW COMPARISON:  12/17/2017 FINDINGS: Sternotomy wires overlie normal cardiac silhouette. Lungs are hyperinflated. There is peripheral interstitial linear thickening. Small pleural effusions. No focal consolidation. On the lateral projection, there is a new density of the posterior aspect of the T11 vertebral body IMPRESSION: 1. Interstitial edema pattern and small effusions. 2. Hyperinflated lungs. 3. New posterior lower lobe density on the lateral projection. Recommend CT of the thorax for further evaluation Electronically Signed   By: Suzy Bouchard M.D.   On: 02/28/2021 12:35   CT Angio Chest PE W/Cm &/Or Wo Cm  Result Date: 02/28/2021 CLINICAL DATA:  Chest pressure since last night, arrhythmia EXAM: CT ANGIOGRAPHY CHEST WITH CONTRAST TECHNIQUE: Multidetector CT imaging of the chest was performed using the standard protocol during bolus administration of intravenous contrast. Multiplanar CT image reconstructions and MIPs were obtained to evaluate the vascular anatomy. CONTRAST:  132mL OMNIPAQUE IOHEXOL 350 MG/ML SOLN COMPARISON:  02/28/2021 FINDINGS: Cardiovascular: This is a technically adequate evaluation of the pulmonary vasculature. No  filling defects or pulmonary emboli. Heart is enlarged without pericardial effusion. Reflux of contrast into the hepatic veins suggests cardiac dysfunction. No evidence of thoracic aortic aneurysm or dissection. Mild atherosclerosis. Mediastinum/Nodes: No enlarged mediastinal, hilar, or axillary lymph nodes. Thyroid gland, trachea, and esophagus demonstrate no significant findings. Lungs/Pleura: There are trace bilateral pleural effusions, right greater than left. No airspace disease or pneumothorax. The central airways are patent. Upper Abdomen: No acute abnormality. Musculoskeletal: No acute or destructive bony lesions. Reconstructed images demonstrate no additional findings. Review of the MIP images confirms the above findings. IMPRESSION: 1. No evidence of pulmonary embolus. 2. Cardiomegaly, with reflux of contrast into the hepatic veins suggesting cardiac dysfunction. 3. Trace bilateral pleural effusions, right greater than left. 4.  Aortic Atherosclerosis (ICD10-I70.0). Electronically Signed   By: Randa Ngo M.D.   On: 02/28/2021 15:55   ECHOCARDIOGRAM COMPLETE  Result Date: 03/01/2021    ECHOCARDIOGRAM REPORT   Patient Name:   ANNETE JEREMY Date of Exam: 03/01/2021 Medical Rec #:  [id number]      Height:       63.0 in Accession #:    [id number]     Weight:  169.8 lb Date of Birth:  June 28, 1946      BSA:          1.803 m Patient Age:    65 years       BP:           125/80 mmHg Patient Gender: F              HR:           127 bpm. Exam Location:  [facility] Procedure: 2D Echo, Cardiac Doppler and Color Doppler Indications:    Atrial Fibrillation I48.91  History:        Patient has prior history of Echocardiogram examinations, most                 recent 12/08/2017. Arrythmias:LBBB; Risk Factors:Hypertension,                 Dyslipidemia and Diabetes. ASD repair, GERD.  Sonographer:    Alvino Chapel RCS Referring Phys: [phone] [place]  1. Left ventricular ejection fraction, by  estimation, is 30 to 35%. The left ventricle has moderately decreased function. The left ventricle demonstrates global hypokinesis. There is mild left ventricular hypertrophy. Left ventricular diastolic parameters are indeterminate.  2. Right ventricular systolic function is low normal. The right ventricular size is normal.  3. Left atrial size was moderately dilated.  4. Right atrial size was moderately dilated.  5. The mitral valve is abnormal. Moderate mitral valve regurgitation. No evidence of mitral stenosis.  6. Tricuspid valve regurgitation is moderate to severe.  7. The aortic valve is tricuspid. There is moderate calcification of the aortic valve. There is moderate thickening of the aortic valve. Aortic valve regurgitation is not visualized. No aortic stenosis is present.  8. The inferior vena cava is dilated in size with <50% respiratory variability, suggesting right atrial pressure of 15 mmHg. FINDINGS  Left Ventricle: Left ventricular ejection fraction, by estimation, is 30 to 35%. The left ventricle has moderately decreased function. The left ventricle demonstrates global hypokinesis. The left ventricular internal cavity size was normal in size. There is mild left ventricular hypertrophy. Left ventricular diastolic parameters are indeterminate. Right Ventricle: The right ventricular size is normal. Right vetricular wall thickness was not assessed. Right ventricular systolic function is low normal. Left Atrium: Left atrial size was moderately dilated. Right Atrium: Right atrial size was moderately dilated. Pericardium: There is no evidence of pericardial effusion. Mitral Valve: The mitral valve is abnormal. There is mild thickening of the mitral valve leaflet(s). There is mild calcification of the mitral valve leaflet(s). Mild mitral annular calcification. Moderate mitral valve regurgitation. No evidence of mitral  valve stenosis. Tricuspid Valve: The tricuspid valve is normal in structure. Tricuspid  valve regurgitation is moderate to severe. No evidence of tricuspid stenosis. Aortic Valve: The aortic valve is tricuspid. There is moderate calcification of the aortic valve. There is moderate thickening of the aortic valve. There is moderate aortic valve annular calcification. Aortic valve regurgitation is not visualized. No aortic stenosis is present. Aortic valve mean gradient measures 3.5 mmHg. Aortic valve peak gradient measures 8.1 mmHg. Aortic valve area, by VTI measures 1.30 cm. Pulmonic Valve: The pulmonic valve was not well visualized. Pulmonic valve regurgitation is not visualized. No evidence of pulmonic stenosis. Aorta: The aortic root is normal in size and structure. Pulmonary Artery: Moderate to severe pulmonary HTN, PASP is 28mmHg. Venous: The inferior vena cava is dilated in size with less than 50% respiratory variability, suggesting right  atrial pressure of 15 mmHg. IAS/Shunts: No atrial level shunt detected by color flow Doppler.  LEFT VENTRICLE PLAX 2D LVIDd:         5.00 cm LVIDs:         4.10 cm LV PW:         1.20 cm LV IVS:        1.00 cm LVOT diam:     1.60 cm LV SV:         23 LV SV Index:   13 LVOT Area:     2.01 cm  RIGHT VENTRICLE TAPSE (M-mode): 1.7 cm LEFT ATRIUM             Index       RIGHT ATRIUM           Index LA diam:        4.40 cm 2.44 cm/m  RA Area:     22.70 cm LA Vol (A2C):   87.5 ml 48.52 ml/m RA Volume:   65.90 ml  36.54 ml/m LA Vol (A4C):   67.3 ml 37.32 ml/m LA Biplane Vol: 81.3 ml 45.08 ml/m  AORTIC VALVE AV Area (Vmax):    1.10 cm AV Area (Vmean):   1.15 cm AV Area (VTI):     1.30 cm AV Vmax:           142.36 cm/s AV Vmean:          84.655 cm/s AV VTI:            0.176 m AV Peak Grad:      8.1 mmHg AV Mean Grad:      3.5 mmHg LVOT Vmax:         77.60 cm/s LVOT Vmean:        48.300 cm/s LVOT VTI:          0.114 m LVOT/AV VTI ratio: 0.65  AORTA Ao Root diam: 3.20 cm MITRAL VALVE                 TRICUSPID VALVE MV Area (PHT): 6.32 cm      TR Peak grad:   44.9  mmHg MV Decel Time: 120 msec      TR Vmax:        335.00 cm/s MR Peak grad:    109.4 mmHg MR Mean grad:    69.0 mmHg   SHUNTS MR Vmax:         523.00 cm/s Systemic VTI:  0.11 m MR Vmean:        382.0 cm/s  Systemic Diam: 1.60 cm MR PISA:         1.57 cm MR PISA Eff ROA: 9 mm MR PISA Radius:  0.50 cm MV E velocity: 175.00 cm/s Carlyle Dolly MD Electronically signed by Carlyle Dolly MD Signature Date/Time: 03/01/2021/4:35:12 PM    Final    ECHO TEE  Result Date: 03/04/2021    TRANSESOPHOGEAL ECHO REPORT   Patient Name:   Heather Barber Date of Exam: 03/04/2021 Medical Rec #:  [id number]      Height:       63.0 in Accession #:    [id number]     Weight:       170.6 lb Date of Birth:  [birth year]      BSA:          1.807 m Patient Age:    45 years       BP:  119/58 mmHg Patient Gender: F              HR:           124 bpm. Exam Location:  [facility] Procedure: Transesophageal Echo, Cardiac Doppler and Color Doppler Indications:     Atrial Fibrillation  History:         Patient has prior history of Echocardiogram examinations, most                  recent 03/01/2021. CHF, Arrythmias:Atrial Fibrillation,                  Signs/Symptoms:Shortness of Breath and Chest Pain; Risk                  Factors:Diabetes and Hypertension.  Sonographer:     Dustin Flock RDCS Referring Phys:  [id number] Erma Heritage Diagnosing Phys: Carlyle Dolly MD PROCEDURE: The transesophogeal probe was passed without difficulty through the esophogus of the patient. Sedation performed by different physician. The patient was monitored while under deep sedation. Anesthestetic sedation was provided intravenously by Anesthesiology: 148.36mg  of Propofol, 50mg  of Lidocaine. The patient developed no complications during the procedure. IMPRESSIONS  1. Left ventricular ejection fraction, by estimation, is 35 to 40%. The left ventricle has moderately decreased function. The left ventricle demonstrates global hypokinesis.  2. Right  ventricular systolic function is low normal. The right ventricular size is mildly enlarged.  3. Left atrial size was moderately dilated. No left atrial/left atrial appendage thrombus was detected. The LAA emptying velocity was 23 cm/s.  4. Right atrial size was moderately dilated.  5. The mitral valve is abnormal. Moderate mitral valve regurgitation. No evidence of mitral stenosis.  6. The tricuspid valve is abnormal. Tricuspid valve regurgitation is moderate to severe.  7. The aortic valve is tricuspid. Aortic valve regurgitation is not visualized. No aortic stenosis is present.  8. Diffuse moderate plaque in the visualized portions of the descending aorta. FINDINGS  Left Ventricle: Left ventricular ejection fraction, by estimation, is 35 to 40%. The left ventricle has moderately decreased function. The left ventricle demonstrates global hypokinesis. The left ventricular internal cavity size was normal in size. Right Ventricle: The right ventricular size is mildly enlarged. Right vetricular wall thickness was not well visualized. Right ventricular systolic function is low normal. Left Atrium: Left atrial size was moderately dilated. No left atrial/left atrial appendage thrombus was detected. The LAA emptying velocity was 23 cm/s. Right Atrium: Right atrial size was moderately dilated. Pericardium: There is no evidence of pericardial effusion. Mitral Valve: The mitral valve is abnormal. Moderate mitral valve regurgitation. No evidence of mitral valve stenosis. Tricuspid Valve: The tricuspid valve is abnormal. Tricuspid valve regurgitation is moderate to severe. No evidence of tricuspid stenosis. Aortic Valve: The aortic valve is tricuspid. Aortic valve regurgitation is not visualized. No aortic stenosis is present. Aortic valve mean gradient measures 4.0 mmHg. Aortic valve peak gradient measures 6.6 mmHg. Pulmonic Valve: The pulmonic valve was normal in structure. Pulmonic valve regurgitation is mild. No evidence of  pulmonic stenosis. Aorta: Diffuse moderate plaque in the visualized portions of the descending aorta. The aortic root is normal in size and structure. IAS/Shunts: No atrial level shunt detected by color flow Doppler.  AORTIC VALVE AV Vmax:           128.00 cm/s AV Vmean:          89.500 cm/s AV VTI:  0.183 m AV Peak Grad:      6.6 mmHg AV Mean Grad:      4.0 mmHg LVOT Vmax:         77.90 cm/s LVOT Vmean:        46.900 cm/s LVOT VTI:          0.092 m LVOT/AV VTI ratio: 0.50  SHUNTS Systemic VTI: 0.09 m Carlyle Dolly MD Electronically signed by Carlyle Dolly MD Signature Date/Time: 03/04/2021/1:35:35 PM    Final      Discharge Exam: Vitals:   03/05/21 0717 03/05/21 0800  BP:  123/64  Pulse: 82 72  Resp: 19 19  Temp: 97.6 F (36.4 C)   SpO2: 98% 99%   Vitals:   03/05/21 0600 03/05/21 0700 03/05/21 0717 03/05/21 0800  BP: (!) 126/38 (!) 106/40  123/64  Pulse: 75 70 82 72  Resp: (!) 24 18 19 19   Temp:   97.6 F (36.4 C)   TempSrc:   Oral   SpO2: 99% 96% 98% 99%  Weight:      Height:        General: Pt is alert, awake, not in acute distress Cardiovascular: RRR, S1/S2 +, no rubs, no gallops Respiratory: CTA bilaterally, no wheezing, no rhonchi Abdominal: Soft, NT, ND, bowel sounds + Extremities: no edema, no cyanosis    The results of significant diagnostics from this hospitalization (including imaging, microbiology, ancillary and laboratory) are listed below for reference.     Microbiology: Recent Results (from the past 240 hour(s))  Resp Panel by RT-PCR (Flu A&B, Covid) Nasopharyngeal Swab     Status: None   Collection Time: 02/28/21 12:56 PM   Specimen: Nasopharyngeal Swab; Nasopharyngeal(NP) swabs in vial transport medium  Result Value Ref Range Status   SARS Coronavirus 2 by RT PCR NEGATIVE NEGATIVE Final    Comment: (NOTE) SARS-CoV-2 target nucleic acids are NOT DETECTED.  The SARS-CoV-2 RNA is generally detectable in upper respiratory specimens during the  acute phase of infection. The lowest concentration of SARS-CoV-2 viral copies this assay can detect is 138 copies/mL. A negative result does not preclude SARS-Cov-2 infection and should not be used as the sole basis for treatment or other patient management decisions. A negative result may occur with  improper specimen collection/handling, submission of specimen other than nasopharyngeal swab, presence of viral mutation(s) within the areas targeted by this assay, and inadequate number of viral copies(<138 copies/mL). A negative result must be combined with clinical observations, patient history, and epidemiological information. The expected result is Negative.  Fact Sheet for Patients:  EntrepreneurPulse.com.au  Fact Sheet for Healthcare Providers:  IncredibleEmployment.be  This test is no t yet approved or cleared by the [place] FDA and  has been authorized for detection and/or diagnosis of SARS-CoV-2 by FDA under an Emergency Use Authorization (EUA). This EUA will remain  in effect (meaning this test can be used) for the duration of the COVID-19 declaration under Section 564(b)(1) of the Act, 21 U.S.C.section 360bbb-3(b)(1), unless the authorization is terminated  or revoked sooner.       Influenza A by PCR NEGATIVE NEGATIVE Final   Influenza B by PCR NEGATIVE NEGATIVE Final    Comment: (NOTE) The Xpert Xpress SARS-CoV-2/FLU/RSV plus assay is intended as an aid in the diagnosis of influenza from Nasopharyngeal swab specimens and should not be used as a sole basis for treatment. Nasal washings and aspirates are unacceptable for Xpert Xpress SARS-CoV-2/FLU/RSV testing.  Fact Sheet for Patients: EntrepreneurPulse.com.au  Fact Sheet  for Healthcare Providers: IncredibleEmployment.be  This test is not yet approved or cleared by the [place] and has been authorized for detection and/or  diagnosis of SARS-CoV-2 by FDA under an Emergency Use Authorization (EUA). This EUA will remain in effect (meaning this test can be used) for the duration of the COVID-19 declaration under Section 564(b)(1) of the Act, 21 U.S.C. section 360bbb-3(b)(1), unless the authorization is terminated or revoked.  Performed at [facility], [address], [place], [place] [address]   Surgical PCR screen     Status: None   Collection Time: 03/04/21  7:18 AM   Specimen: Nasal Mucosa; Nasal Swab  Result Value Ref Range Status   MRSA, PCR NEGATIVE NEGATIVE Final   Staphylococcus aureus NEGATIVE NEGATIVE Final    Comment: (NOTE) The Xpert SA Assay (FDA approved for NASAL specimens in patients 24 years of age and older), is one component of a comprehensive surveillance program. It is not intended to diagnose infection nor to guide or monitor treatment. Performed at [facility], [address] [place], [place] [address]      Labs: BNP (last 3 results) Recent Labs    02/28/21 1213  BNP 621.3*   Basic Metabolic Panel: Recent Labs  Lab 02/28/21 1213 03/01/21 0457 03/02/21 0434 03/04/21 0544 03/05/21 0422  NA 140 141 139 138 140  K 3.8 3.6 3.9 3.9 3.6  CL 105 105 103 102 103  CO2 27 28 28 28 26   GLUCOSE 136* 125* 128* 131* 122*  BUN 18 17 16 22 20   CREATININE 0.77 0.69 0.79 0.89 0.80  CALCIUM 8.9 8.9 9.0 9.2 9.4  MG 1.7  --  1.7 1.7 1.8   Liver Function Tests: Recent Labs  Lab 02/28/21 1213  AST 17  ALT 23  ALKPHOS 130*  BILITOT 0.8  PROT 7.0  ALBUMIN 4.0   No results for input(s): LIPASE, AMYLASE in the last 168 hours. No results for input(s): AMMONIA in the last 168 hours. CBC: Recent Labs  Lab 02/28/21 1149 03/01/21 0457 03/05/21 0422  WBC 6.0 5.3 5.5  HGB 11.5* 10.7* 12.3  HCT 33.0* 31.2* 35.7*  MCV 120.9* 120.9* 119.0*  PLT 269 248 296   Cardiac Enzymes: No results for input(s): CKTOTAL, CKMB, CKMBINDEX, TROPONINI in the last 168 hours. BNP: Invalid  input(s): POCBNP CBG: Recent Labs  Lab 03/04/21 2101 03/05/21 0719  GLUCAP 167* 134*   D-Dimer No results for input(s): DDIMER in the last 72 hours. Hgb A1c No results for input(s): HGBA1C in the last 72 hours. Lipid Profile No results for input(s): CHOL, HDL, LDLCALC, TRIG, CHOLHDL, LDLDIRECT in the last 72 hours. Thyroid function studies No results for input(s): TSH, T4TOTAL, T3FREE, THYROIDAB in the last 72 hours.  Invalid input(s): FREET3 Anemia work up No results for input(s): VITAMINB12, FOLATE, FERRITIN, TIBC, IRON, RETICCTPCT in the last 72 hours. Urinalysis    Component Value Date/Time   COLORURINE STRAW (A) 12/05/2019 1318   APPEARANCEUR CLEAR 12/05/2019 1318   APPEARANCEUR Clear 08/29/2019 1221   LABSPEC 1.010 12/05/2019 1318   PHURINE 6.0 12/05/2019 1318   GLUCOSEU NEGATIVE 12/05/2019 1318   HGBUR NEGATIVE 12/05/2019 1318   BILIRUBINUR NEGATIVE 12/05/2019 1318   BILIRUBINUR Negative 08/29/2019 [place] 12/05/2019 1318   PROTEINUR NEGATIVE 12/05/2019 1318   NITRITE NEGATIVE 12/05/2019 1318   LEUKOCYTESUR SMALL (A) 12/05/2019 1318   Sepsis Labs Invalid input(s): PROCALCITONIN,  WBC,  LACTICIDVEN Microbiology Recent Results (from the past 240 hour(s))  Resp  Panel by RT-PCR (Flu A&B, Covid) Nasopharyngeal Swab     Status: None   Collection Time: 02/28/21 12:56 PM   Specimen: Nasopharyngeal Swab; Nasopharyngeal(NP) swabs in vial transport medium  Result Value Ref Range Status   SARS Coronavirus 2 by RT PCR NEGATIVE NEGATIVE Final    Comment: (NOTE) SARS-CoV-2 target nucleic acids are NOT DETECTED.  The SARS-CoV-2 RNA is generally detectable in upper respiratory specimens during the acute phase of infection. The lowest concentration of SARS-CoV-2 viral copies this assay can detect is 138 copies/mL. A negative result does not preclude SARS-Cov-2 infection and should not be used as the sole basis for treatment or other patient management  decisions. A negative result may occur with  improper specimen collection/handling, submission of specimen other than nasopharyngeal swab, presence of viral mutation(s) within the areas targeted by this assay, and inadequate number of viral copies(<138 copies/mL). A negative result must be combined with clinical observations, patient history, and epidemiological information. The expected result is Negative.  Fact Sheet for Patients:  EntrepreneurPulse.com.au  Fact Sheet for Healthcare Providers:  IncredibleEmployment.be  This test is no t yet approved or cleared by the [place] FDA and  has been authorized for detection and/or diagnosis of SARS-CoV-2 by FDA under an Emergency Use Authorization (EUA). This EUA will remain  in effect (meaning this test can be used) for the duration of the COVID-19 declaration under Section 564(b)(1) of the Act, 21 U.S.C.section 360bbb-3(b)(1), unless the authorization is terminated  or revoked sooner.       Influenza A by PCR NEGATIVE NEGATIVE Final   Influenza B by PCR NEGATIVE NEGATIVE Final    Comment: (NOTE) The Xpert Xpress SARS-CoV-2/FLU/RSV plus assay is intended as an aid in the diagnosis of influenza from Nasopharyngeal swab specimens and should not be used as a sole basis for treatment. Nasal washings and aspirates are unacceptable for Xpert Xpress SARS-CoV-2/FLU/RSV testing.  Fact Sheet for Patients: EntrepreneurPulse.com.au  Fact Sheet for Healthcare Providers: IncredibleEmployment.be  This test is not yet approved or cleared by the [place] FDA and has been authorized for detection and/or diagnosis of SARS-CoV-2 by FDA under an Emergency Use Authorization (EUA). This EUA will remain in effect (meaning this test can be used) for the duration of the COVID-19 declaration under Section 564(b)(1) of the Act, 21 U.S.C. section 360bbb-3(b)(1), unless the  authorization is terminated or revoked.  Performed at [facility], [address], [place], [place] [address]   Surgical PCR screen     Status: None   Collection Time: 03/04/21  7:18 AM   Specimen: Nasal Mucosa; Nasal Swab  Result Value Ref Range Status   MRSA, PCR NEGATIVE NEGATIVE Final   Staphylococcus aureus NEGATIVE NEGATIVE Final    Comment: (NOTE) The Xpert SA Assay (FDA approved for NASAL specimens in patients 48 years of age and older), is one component of a comprehensive surveillance program. It is not intended to diagnose infection nor to guide or monitor treatment. Performed at [facility], [address], [place], [place] [address]      Time coordinating discharge: 35 minutes  SIGNED:   Rodena Goldmann, DO [facility] 03/05/2021, 9:31 AM  If 7PM-7AM, please contact night-coverage [email or web address]

## 2021-03-07 ENCOUNTER — Telehealth: Payer: Self-pay

## 2021-03-07 NOTE — Telephone Encounter (Signed)
Transition Care Management Follow-up Telephone Call  Date of discharge and from where: 03/05/21 From [facility]  How have you been since you were released from the hospital? better  Any questions or concerns? No  Items Reviewed:  Did the pt receive and understand the discharge instructions provided? Yes   Medications obtained and verified? Yes   Other? No   Any new allergies since your discharge? No   Dietary orders reviewed? Yes  Do you have support at home? Yes   Home Care and Equipment/Supplies: Were home health services ordered? no  Functional Questionnaire: (I = Independent and D = Dependent) ADLs: I  Bathing/Dressing- I  Meal Prep- I  Eating- I  Maintaining continence- I  Transferring/Ambulation- I  Managing Meds- I  Follow up appointments reviewed:   PCP Hospital f/u appt confirmed? Yes  Scheduled to see Dettinger on 5/19 @ 3:55.  [facility] f/u appt confirmed? Yes  Scheduled to see Cardiology on 5/20 @ 1:30.  Are transportation arrangements needed? No   If their condition worsens, is the pt aware to call PCP or go to the Emergency Dept.? Yes  Was the patient provided with contact information for the PCP's office or ED? Yes  Was to pt encouraged to call back with questions or concerns? Yes

## 2021-03-08 ENCOUNTER — Encounter (HOSPITAL_COMMUNITY): Payer: Self-pay | Admitting: Cardiology

## 2021-03-17 ENCOUNTER — Encounter: Payer: Self-pay | Admitting: Family Medicine

## 2021-03-17 ENCOUNTER — Ambulatory Visit (INDEPENDENT_AMBULATORY_CARE_PROVIDER_SITE_OTHER): Payer: Medicare Other | Admitting: Family Medicine

## 2021-03-17 ENCOUNTER — Other Ambulatory Visit: Payer: Self-pay

## 2021-03-17 ENCOUNTER — Encounter: Payer: Self-pay | Admitting: Physician Assistant

## 2021-03-17 VITALS — BP 122/66 | HR 66 | Ht 63.0 in | Wt 171.0 lb

## 2021-03-17 DIAGNOSIS — I4819 Other persistent atrial fibrillation: Secondary | ICD-10-CM

## 2021-03-17 DIAGNOSIS — I4891 Unspecified atrial fibrillation: Secondary | ICD-10-CM

## 2021-03-17 DIAGNOSIS — E1169 Type 2 diabetes mellitus with other specified complication: Secondary | ICD-10-CM

## 2021-03-17 LAB — BAYER DCA HB A1C WAIVED: HB A1C (BAYER DCA - WAIVED): 6.6 % (ref <7.0)

## 2021-03-17 NOTE — Progress Notes (Signed)
BP 122/66   Pulse 66   Ht 5\' 3"  (1.6 m)   Wt 171 lb (77.6 kg)   SpO2 96%   BMI 30.29 kg/m    Subjective:   [name], female    DOB: 10-13-1946, 75 y.o.   MRN: [id number]  HPI: Heather Barber is a 75 y.o. female presenting on 03/17/2021 for Medical Management of Chronic Issues, Diabetes, and Atrial Fibrillation   HPI New onset A. Fib Transitional care visit: Patient was contacted by [facility] LPN on 12/08/5186.  She was discharged from [facility] on 03/05/2021  Transition of care visit Patient coming today for transitional care office visit. Patient had an attempted cardioversion during the hospitalization that was unsuccessful and stayed in A. fib.  She is currently on amiodarone and Eliquis and has an appointment with her cardiologist tomorrow to be seen again.  She says Eliquis is very expensive and does need assistance.  We will get her set up for clinical pharmacist to help with assistance.  She says she initially went in with shortness of breath because her heart was racing so fast and she has been feeling better since.  She denies any major bruising or bleeding.  She has been doing really well.  She was admitted on 02/28/2021 and discharged on 03/05/2021.  She was started on amiodarone and Eliquis.   Type 2 diabetes mellitus Patient comes in today for recheck of his diabetes. Patient has been currently taking no medication currently, diet controlled, A1c 6.6. Patient is currently on an ACE inhibitor/ARB. Patient has not seen an ophthalmologist this year. Patient denies any issues with their feet. The symptom started onset as an adult A. fib and hyperlipidemia and hypertension ARE RELATED TO DM  Relevant past medical, surgical, family and social history reviewed and updated as indicated. Interim medical history since our last visit reviewed. Allergies and medications reviewed and updated.  Review of Systems  Constitutional: Negative for chills and fever.   Eyes: Negative for visual disturbance.  Respiratory: Negative for cough, chest tightness, shortness of breath and wheezing.   Cardiovascular: Negative for chest pain, palpitations and leg swelling.  Genitourinary: Negative for difficulty urinating and dysuria.  Musculoskeletal: Negative for back pain and gait problem.  Skin: Negative for rash.  Neurological: Negative for dizziness, light-headedness and headaches.  Psychiatric/Behavioral: Negative for agitation and behavioral problems.  All other systems reviewed and are negative.   Per HPI unless specifically indicated above   Allergies as of 03/17/2021      Reactions   Codeine Nausea And Vomiting   Morphine Other (See Comments)   GI symptoms      Medication List       Accurate as of Mar 17, 2021  4:20 PM. If you have any questions, ask your nurse or doctor.        amiodarone 200 MG tablet Commonly known as: Pacerone Take 400mg  by mouth twice daily for seven days, followed by 200mg  by mouth twice daily for fourteen days, followed by 200mg  by mouth daily thereafter.   apixaban 5 MG Tabs tablet Commonly known as: ELIQUIS Take 1 tablet (5 mg total) by mouth 2 (two) times daily.   aspirin EC 81 MG tablet Take 81 mg by mouth daily.   atorvastatin 20 MG tablet Commonly known as: LIPITOR Take 1 tablet (20 mg total) by mouth at bedtime.   fenofibrate 160 MG tablet Take 1 tablet (160 mg total) by mouth daily.  furosemide 20 MG tablet Commonly known as: LASIX TAKE 1 TABLET BY MOUTH EVERY DAY   hydroxyurea 500 MG capsule Commonly known as: HYDREA TAKE 1 CAPSULE BY MOUTH TWICE A DAY   ibuprofen 600 MG tablet Commonly known as: ADVIL Take 1 tablet (600 mg total) by mouth every 6 (six) hours as needed for moderate pain. For AFTER surgery, use sparingly What changed: when to take this   ketoconazole 2 % cream Commonly known as: NIZORAL Apply 1 application topically 2 (two) times daily. What changed:   when to take  this  reasons to take this   losartan 25 MG tablet Commonly known as: COZAAR Take 1 tablet (25 mg total) by mouth daily.   metoprolol succinate 100 MG 24 hr tablet Commonly known as: TOPROL-XL Take 1 tablet (100 mg total) by mouth 2 (two) times daily. Take with or immediately following a meal.   Multivitamin Women 50+ Tabs Take by mouth daily.   naproxen sodium 220 MG tablet Commonly known as: ALEVE Take 220 mg by mouth as needed.   omeprazole 20 MG capsule Commonly known as: PRILOSEC Take 1 capsule (20 mg total) by mouth daily.        Objective:   BP 122/66   Pulse 66   Ht 5\' 3"  (1.6 m)   Wt 171 lb (77.6 kg)   SpO2 96%   BMI 30.29 kg/m   Wt Readings from Last 3 Encounters:  03/17/21 171 lb (77.6 kg)  03/05/21 164 lb 7.4 oz (74.6 kg)  01/18/21 169 lb 12.8 oz (77 kg)    Physical Exam Vitals and nursing note reviewed.  Constitutional:      General: She is not in acute distress.    Appearance: She is well-developed. She is not diaphoretic.  Eyes:     Conjunctiva/sclera: Conjunctivae normal.  Cardiovascular:     Rate and Rhythm: Normal rate. Rhythm irregular.     Heart sounds: Normal heart sounds. No murmur heard.   Pulmonary:     Effort: Pulmonary effort is normal. No respiratory distress.     Breath sounds: Normal breath sounds. No wheezing.  Musculoskeletal:        General: No tenderness. Normal range of motion.  Skin:    General: Skin is warm and dry.     Findings: No rash.  Neurological:     Mental Status: She is alert and oriented to person, place, and time.     Coordination: Coordination normal.  Psychiatric:        Behavior: Behavior normal.       Assessment & Plan:   Problem List Items Addressed This Visit      Cardiovascular and Mediastinum   New onset atrial fibrillation ([name])   A-fib ([name])     Endocrine   Type 2 diabetes mellitus with other specified complication ([place]) - Primary   Relevant Orders   Bayer DCA Hb A1c Waived       A1c looks good, continue current medication, she seems to be doing really well, has a follow-up for A. fib with cardiologist tomorrow. Follow up plan: Return in about 3 months (around 06/17/2021), or if symptoms worsen or fail to improve, for Diabetes recheck.  Counseling provided for all of the vaccine components Orders Placed This Encounter  Procedures  . Bayer [facility] Hb A1c [place], MD [facility] 03/17/2021, 4:20 PM

## 2021-03-17 NOTE — Progress Notes (Addendum)
Cardiology Office Note    Date:  03/18/2021   ID:  Heather Barber, DOB 04/17/1946, MRN [id number]  PCP:  Dettinger, Heather Kaufmann, MD  Cardiologist:  Heather Dolly, MD  Electrophysiologist:  None   Chief Complaint: f/u atrial fib   History of Present Illness:   Heather Barber is a 75 y.o. female with history of palpitations (prior monitor showing SVT), reported history of VT by prior notes and normal cors by cath in 1994 and 2011), LBBB, ASD (s/p repair in 1994), thombocytosis (follows with heme-onc), HTN, HLD/hypertriglyceridemia (followed by PCP), mitral regurgitation, tricuspid regurgitation, endometrial cancer and recently diagnosed atrial fib with cardiomyopathy who presents for follow-up.   She was previously followed by Dr. Lattie Barber for remote history of VT without known recurrence. LVEF in 2013 was 45-50%, then 50-55% up to echo in 2019. She also has h/o palpitations and monitor in 2020 showed episodic PSVT. She was admitted 02/2021 with worsening chest discomfort/SOB found to have atrial fib with RVR. CTA negative for PE. 2D echo showed EF 30-35%, global HK, low normal RV function, moderate BAE, moderate MR, moderate-severe TR. She was started on Eliquis. Soft BP limited converted to Entresto or adding spironolactone at the time. She underwent TEE/DCCV which was initially successful but then reverted back to atrial fib/flutter. She was subsequently started on IV amiodarone drip and converted back to NSR. She was discharged on oral amiodarone taper. Cardiomyopathy felt to be NICM due to tachycardia but will need eventual reassessment of LVEF. She went home on lower dose of losartan and higher dose of Toprol.  She returns for follow-up today with daughter Heather Barber and feels like she is doing great. She is not having any recurrent SOB. She does still have mild residual edema on exam - she states this was present even before recent hospitalization. No orthopnea, palpitations, dizziness, chest  pain, bleeding. She inquires if she can stop aspirin. SBP here 130/80, recheck 138/80 by me. She reports home values 120s/80s. She was spending time with a friend recently and they were astounded that she didn't put salt on her watermelon like she used to!  Labwork independently reviewed: 02/2021 K 3.6, Cr 0.80, Mg 1.8, Hgb 12.3, Plt 296, TSH wnl, LFTS ok except AP 130, troponins negative   Past Medical History:  Diagnosis Date  . Anxiety   . Atrial flutter ([place])   . Atrial septal defect    Surgical repair in 1994  . Cardiomyopathy ([place])   . Chest pain    Normal coronary angiography in 1994 and 2011  . Dizziness   . Endometrial cancer, grade I ([place]) 10/09/2019  . Essential thrombocythemia ([place]) 06/06/2016  . GERD (gastroesophageal reflux disease)   . History of kidney stones   . Hyperlipidemia   . Hypertension   . LBBB (left bundle branch block) 2011  . Mitral regurgitation   . Palpitations   . Persistent atrial fibrillation ([place])   . PONV (postoperative nausea and vomiting)   . Syncope   . Tricuspid regurgitation   . Type 2 diabetes mellitus with other specified complication ([place]) 1/75/1025  . Ventricular tachycardia ([place])    Exercise induced    Past Surgical History:  Procedure Laterality Date  . APPENDECTOMY    . ASD [place] EXCISIONAL BIOPSY     Right and left  . BREAST SURGERY     right and left breast-benign  . CARDIAC CATHETERIZATION  12/24/2009  Dr. Burt Barber   . CARDIOVERSION N/A 03/04/2021   Procedure: CARDIOVERSION;  Surgeon: Heather Lenis, MD;  Location: [facility];  Service: Endoscopy;  Laterality: N/A;  . CATARACT EXTRACTION W/PHACO Left 03/11/2013   Procedure: CATARACT EXTRACTION PHACO AND INTRAOCULAR LENS PLACEMENT (IOC);  Surgeon: Heather Guadeloupe T. Gershon Crane, MD;  Location: [facility];  Service: Ophthalmology;  Laterality: Left;  CDE:  12.20  . HYSTEROSCOPY WITH D & C N/A 11/11/2019   Procedure: DILATATION AND CURETTAGE /HYSTEROSCOPY;   Surgeon: Heather Kind, MD;  Location: [facility];  Service: Gynecology;  Laterality: N/A;  . POLYPECTOMY N/A 11/11/2019   Procedure: POLYPECTOMY(ENDOMETRIAL POLYP);  Surgeon: Heather Kind, MD;  Location: [facility];  Service: Gynecology;  Laterality: N/A;  . ROBOTIC ASSISTED LAPAROSCOPIC HYSTERECTOMY AND SALPINGECTOMY Bilateral 12/09/2019   Procedure: XI ROBOTIC ASSISTED LAPAROSCOPIC HYSTERECTOMY BILATERAL SALPINGOOOPHORECTOMY;  Surgeon: Heather Amber, MD;  Location: [facility];  Service: Gynecology;  Laterality: Bilateral;  . SENTINEL NODE BIOPSY N/A 12/09/2019   Procedure: SENTINEL LYMPH  NODE BIOPSY;  Surgeon: Heather Amber, MD;  Location: [facility];  Service: Gynecology;  Laterality: N/A;  . TEE WITHOUT CARDIOVERSION N/A 03/04/2021   Procedure: TRANSESOPHAGEAL ECHOCARDIOGRAM (TEE);  Surgeon: Heather Lenis, MD;  Location: [facility];  Service: Endoscopy;  Laterality: N/A;  . TONSILLECTOMY    . TUBAL LIGATION     Bilateral    Current Medications: Current Meds  Medication Sig  . amiodarone (PACERONE) 200 MG tablet Take 400mg  by mouth twice daily for seven days, followed by 200mg  by mouth twice daily for fourteen days, followed by 200mg  by mouth daily thereafter.  Marland Kitchen apixaban (ELIQUIS) 5 MG TABS tablet Take 1 tablet (5 mg total) by mouth 2 (two) times daily.  Marland Kitchen aspirin EC 81 MG tablet Take 81 mg by mouth daily.  Marland Kitchen atorvastatin (LIPITOR) 20 MG tablet Take 1 tablet (20 mg total) by mouth at bedtime.  . fenofibrate 160 MG tablet Take 1 tablet (160 mg total) by mouth daily.  . furosemide (LASIX) 20 MG tablet TAKE 1 TABLET BY MOUTH EVERY DAY  . hydroxyurea (HYDREA) 500 MG capsule TAKE 1 CAPSULE BY MOUTH TWICE A DAY  . ibuprofen (ADVIL) 600 MG tablet Take 1 tablet (600 mg total) by mouth every 6 (six) hours as needed for moderate pain. For AFTER surgery, use sparingly (Patient taking differently: Take 600 mg by mouth as needed for moderate pain. For AFTER surgery, use sparingly)  . ketoconazole (NIZORAL) 2 % cream  Apply 1 application topically 2 (two) times daily. (Patient taking differently: Apply 1 application topically as needed.)  . losartan (COZAAR) 25 MG tablet Take 1 tablet (25 mg total) by mouth daily.  . metoprolol succinate (TOPROL-XL) 100 MG 24 hr tablet Take 1 tablet (100 mg total) by mouth 2 (two) times daily. Take with or immediately following a meal.  . Multiple Vitamins-Minerals (MULTIVITAMIN WOMEN 50+) TABS Take by mouth daily.  . naproxen sodium (ALEVE) 220 MG tablet Take 220 mg by mouth as needed.  Marland Kitchen omeprazole (PRILOSEC) 20 MG capsule Take 1 capsule (20 mg total) by mouth daily.      Allergies:   Codeine and Morphine   Social History   Socioeconomic History  . Marital status: Widowed    Spouse name: widowed  . Number of children: 3  . Years of education: Not on file  . Highest education level: Not on file  Occupational History  . Occupation: Psychologist, prison and probation services  Tobacco Use  . Smoking status: Never Smoker  .  Smokeless tobacco: Never Used  Vaping Use  . Vaping Use: Never used  Substance and Sexual Activity  . Alcohol use: No  . Drug use: No  . Sexual activity: Not Currently    Birth control/protection: Post-menopausal  Other Topics Concern  . Not on file  Social History Narrative   Married and resides in [place] with husband and 3 children   Sedentary lifestyle   Social Determinants of Health   Financial Resource Strain: Not on file  Food Insecurity: Not on file  Transportation Needs: No Transportation Needs  . Lack of Transportation (Medical): No  . Lack of Transportation (Non-Medical): No  Physical Activity: Inactive  . Days of Exercise per Week: 0 days  . Minutes of Exercise per Session: 0 min  Stress: Not on file  Social Connections: Not on file     Family History:  The patient's family history includes Cancer in her mother and sister; Heart failure in her father; Stroke in her sister. There is no history of Coronary artery disease.  ROS:   Please  see the history of present illness.  All other systems are reviewed and otherwise negative.    EKGs/Labs/Other Studies Reviewed:    Studies reviewed are outlined and summarized above. Reports included below if pertinent.  2D echo 03/01/21 1. Left ventricular ejection fraction, by estimation, is 30 to 35%. The  left ventricle has moderately decreased function. The left ventricle  demonstrates global hypokinesis. There is mild left ventricular  hypertrophy. Left ventricular diastolic  parameters are indeterminate.  2. Right ventricular systolic function is low normal. The right  ventricular size is normal.  3. Left atrial size was moderately dilated.  4. Right atrial size was moderately dilated.  5. The mitral valve is abnormal. Moderate mitral valve regurgitation. No  evidence of mitral stenosis.  6. Tricuspid valve regurgitation is moderate to severe.  7. The aortic valve is tricuspid. There is moderate calcification of the  aortic valve. There is moderate thickening of the aortic valve. Aortic  valve regurgitation is not visualized. No aortic stenosis is present.  8. The inferior vena cava is dilated in size with <50% respiratory  variability, suggesting right atrial pressure of 15 mmHg  TEE 03/04/21 1. Left ventricular ejection fraction, by estimation, is 35 to 40%. The  left ventricle has moderately decreased function. The left ventricle  demonstrates global hypokinesis.  2. Right ventricular systolic function is low normal. The right  ventricular size is mildly enlarged.  3. Left atrial size was moderately dilated. No left atrial/left atrial  appendage thrombus was detected. The LAA emptying velocity was 23 cm/s.  4. Right atrial size was moderately dilated.  5. The mitral valve is abnormal. Moderate mitral valve regurgitation. No  evidence of mitral stenosis.  6. The tricuspid valve is abnormal. Tricuspid valve regurgitation is  moderate to severe.  7. The  aortic valve is tricuspid. Aortic valve regurgitation is not  visualized. No aortic stenosis is present.  8. Diffuse moderate plaque in the visualized portions of the descending  aorta.   Event monitor 03/2019  7 day monitor  Min HR 65, Max HR 169, Avg HR 96  Rare ventricular ectopy all as isolated PVCs  Occasional supraventricular ectopy in the form of PACs, couplets. Fairly frequent runs of SVT up to 160 bpm  No symptoms reported      EKG:  EKG is ordered today, personally reviewed, demonstrating NSR 66bpm with first degree AVB, NSIVCD, nonspecific STTW changes, similar to recent  DC   Recent Labs: 02/28/2021: ALT 23; B Natriuretic Peptide 599.0; TSH 3.423 03/05/2021: BUN 20; Creatinine, Ser 0.80; Hemoglobin 12.3; Magnesium 1.8; Platelets 296; Potassium 3.6; Sodium 140  Recent Lipid Panel    Component Value Date/Time   CHOL 200 (H) 06/16/2020 1536   TRIG 646 (HH) 06/16/2020 1536   HDL 33 (L) 06/16/2020 1536   CHOLHDL 6.1 (H) 06/16/2020 1536   CHOLHDL 6.6 12/08/2017 0202   VLDL 73 (H) 12/08/2017 0202   LDLCALC 68 06/16/2020 1536    PHYSICAL EXAM:    VS:  BP 130/80   Pulse 68   Ht 5\' 4"  (1.626 m)   Wt 171 lb (77.6 kg)   SpO2 95%   BMI 29.35 kg/m   BMI: Body mass index is 29.35 kg/m.  GEN: Well nourished, well developed female in no acute distress HEENT: normocephalic, atraumatic Neck: no JVD, carotid bruits, or masses Cardiac: RRR; no murmurs, rubs, or gallops, mild BLE edema with some skin thickening c/w venous stasis Respiratory:  clear to auscultation bilaterally, normal work of breathing GI: soft, nontender, nondistended, + BS MS: no deformity or atrophy Skin: warm and dry, no rash Neuro:  Alert and Oriented x 3, Strength and sensation are intact, follows commands Psych: euthymic mood, full affect  Wt Readings from Last 3 Encounters:  03/18/21 171 lb (77.6 kg)  03/17/21 171 lb (77.6 kg)  03/05/21 164 lb 7.4 oz (74.6 kg)     ASSESSMENT & PLAN:   1.  Persistent atrial fib/flutter - new dx recently - required discharge on amiodarone due to recurrence post-cardioversion. She has been following the instructions as outlined for the amiodarone taper. She is maintaining NSR and feeling well. Would continue amiodarone as outlined, as well as Toprol and Eliquis. Will recheck surveillance CBC/BMET today. I discussed her ASA with Dr. Harl Bowie - he feels this can be discontinued. Anticipate at future visits she will need regular surveillance of her LFTs, thyroid, annual CXR and eye exam given amiodarone use.  2. Cardiomyopathy/recently diagnosed systolic CHF - she reports stable home weight around 171lb and is feeling good. She inquires about continuing her Lasix. I would recommend to continue for now as she does still have mild edema on examination, but will recheck BMET. Her blood pressure looks like it will now support addition of Entresto. We will stop losartan and add Entresto 24/26mg  BID. We discussed these instructions in the OV (both myself and MA with patient) but it does not look like they made it to the MA's completed AVS so I have asked her to call the patient to ensure she is aware. Will check BMET in 2 weeks and have her f/u in 4 weeks for a clinic visit for possible further titration. She may benefit from SGLT2i as well but would prefer to start with 1 med addition at a time in case there are any adverse effects. As noted above, her cardiomyopathy is felt to be possibly tachy-mediated. If doing well at next OV, would suggest to arrange a 3 month repeat echocardiogram to reassess LVEF - if EF still down despite med rx, would need to consider ischemic workup and potentially referral to EP for CRT+/-D. Reviewed 2g sodium restriction, 2L fluid restriction, daily weights with patient.  3. Essential HTN - SBP 130-138 in clinic today, home values 120s/80s. Follow with med change as above.   4. Moderate MR, moderate-severe TR - follow clinically for now -  anticipate reassessment by future echocardiogram.  5. Reported history of  VT, ASD closure - no recurrent VT. No acute issues r/t ASD closure at this time. As discussed with Dr. Harl Bowie, [place] to stop ASA. I'll reach out to our structural team for their most up to date recommendations on SBE ppx for remote ASD closures. Addendum: They confirmed no SBE ppx needed.  Disposition: F/u with Dr. Harl Bowie or APP in 4 weeks.  Medication Adjustments/Labs and Tests Ordered: Current medicines are reviewed at length with the patient today.  Concerns regarding medicines are outlined above. Medication changes, Labs and Tests ordered today are summarized above and listed in the Patient Instructions accessible in Encounters.   Signed, Charlie Pitter, PA-C  03/18/2021 1:59 PM    [facility] Location in [place]. [place], [place] [address] Ph: [phone]; Fax [phone]

## 2021-03-18 ENCOUNTER — Other Ambulatory Visit (HOSPITAL_COMMUNITY)
Admission: RE | Admit: 2021-03-18 | Discharge: 2021-03-18 | Disposition: A | Discharge status: Home / Self Care | Payer: Medicare Other | Source: Ambulatory Visit | Attending: Physician Assistant | Admitting: Physician Assistant

## 2021-03-18 ENCOUNTER — Encounter: Payer: Self-pay | Admitting: Physician Assistant

## 2021-03-18 ENCOUNTER — Other Ambulatory Visit: Payer: Self-pay

## 2021-03-18 ENCOUNTER — Ambulatory Visit (INDEPENDENT_AMBULATORY_CARE_PROVIDER_SITE_OTHER): Payer: Medicare Other | Admitting: Physician Assistant

## 2021-03-18 VITALS — BP 130/80 | HR 68 | Ht 64.0 in | Wt 171.0 lb

## 2021-03-18 DIAGNOSIS — I429 Cardiomyopathy, unspecified: Secondary | ICD-10-CM

## 2021-03-18 DIAGNOSIS — I5022 Chronic systolic (congestive) heart failure: Secondary | ICD-10-CM | POA: Diagnosis not present

## 2021-03-18 DIAGNOSIS — I10 Essential (primary) hypertension: Secondary | ICD-10-CM

## 2021-03-18 DIAGNOSIS — I472 Ventricular tachycardia, unspecified: Secondary | ICD-10-CM

## 2021-03-18 DIAGNOSIS — I4819 Other persistent atrial fibrillation: Secondary | ICD-10-CM

## 2021-03-18 DIAGNOSIS — Z8774 Personal history of (corrected) congenital malformations of heart and circulatory system: Secondary | ICD-10-CM

## 2021-03-18 DIAGNOSIS — I340 Nonrheumatic mitral (valve) insufficiency: Secondary | ICD-10-CM

## 2021-03-18 DIAGNOSIS — I071 Rheumatic tricuspid insufficiency: Secondary | ICD-10-CM

## 2021-03-18 DIAGNOSIS — I4892 Unspecified atrial flutter: Secondary | ICD-10-CM

## 2021-03-18 LAB — BASIC METABOLIC PANEL
Anion gap: 9 (ref 5–15)
BUN: 24 mg/dL — ABNORMAL HIGH (ref 8–23)
CO2: 31 mmol/L (ref 22–32)
Calcium: 9.2 mg/dL (ref 8.9–10.3)
Chloride: 101 mmol/L (ref 98–111)
Creatinine, Ser: 1.22 mg/dL — ABNORMAL HIGH (ref 0.44–1.00)
GFR, Estimated: 47 mL/min — ABNORMAL LOW (ref >60)
Glucose, Bld: 112 mg/dL — ABNORMAL HIGH (ref 70–99)
Potassium: 4.7 mmol/L (ref 3.5–5.1)
Sodium: 141 mmol/L (ref 135–145)

## 2021-03-18 LAB — CBC
HCT: 31.3 % — ABNORMAL LOW (ref 36.0–46.0)
Hemoglobin: 10.5 g/dL — ABNORMAL LOW (ref 12.0–15.0)
MCH: 40.9 pg — ABNORMAL HIGH (ref 26.0–34.0)
MCHC: 33.5 g/dL (ref 30.0–36.0)
MCV: 121.8 fL — ABNORMAL HIGH (ref 80.0–100.0)
Platelets: 327 10*3/uL (ref 150–400)
RBC: 2.57 MIL/uL — ABNORMAL LOW (ref 3.87–5.11)
RDW: 12.7 % (ref 11.5–15.5)
WBC: 5.8 10*3/uL (ref 4.0–10.5)
nRBC: 0 % (ref 0.0–0.2)

## 2021-03-18 MED ORDER — SACUBITRIL-VALSARTAN 24-26 MG PO TABS: 1.0000 | ORAL_TABLET | Freq: Two times a day (BID) | ORAL | 3 refills | Status: DC

## 2021-03-18 NOTE — Patient Instructions (Addendum)
Medication Instructions:  Your physician has recommended you make the following change in your medication:  START Entresto 24/26 mg twice daily STOP ASPIRIN   *If you need a refill on your cardiac medications before your next appointment, please call your pharmacy*   Lab Work: BMET/CBC- Today BMET- 2 weeks If you have labs (blood work) drawn today and your tests are completely normal, you will receive your results only by: Marland Kitchen MyChart Message (if you have MyChart) OR . A paper copy in the mail If you have any lab test that is abnormal or we need to change your treatment, we will call you to review the results.   Testing/Procedures: None   Follow-Up: At [facility], you and your health needs are our priority.  As part of our continuing mission to provide you with exceptional heart care, we have created designated Provider Care Teams.  These Care Teams include your primary Cardiologist (physician) and Advanced Practice Providers (APPs -  Physician Assistants and Nurse Practitioners) who all work together to provide you with the care you need, when you need it.  We recommend signing up for the patient portal called "MyChart".  Sign up information is provided on this After Visit Summary.  MyChart is used to connect with patients for Virtual Visits (Telemedicine).  Patients are able to view lab/test results, encounter notes, upcoming appointments, etc.  Non-urgent messages can be sent to your provider as well.   To learn more about what you can do with MyChart, go to [email or web address].    Your next appointment:   4 week(s)  The format for your next appointment:   In Person  Provider:   You will see one of the following Advanced Practice Providers on your designated Care Team:    [place], PA-C   Ermalinda Barrios, PA-C       Other Instructions     For patients with congestive heart failure, we give them these special instructions:  1. Follow a low-salt diet -  you are allowed no more than 2,000mg  of sodium per day. Watch your fluid intake. In general, you should not be taking in more than 2 liters of fluid per day (close to 64 oz of fluid per day). This includes sources of water in foods like soup, coffee, tea, milk, etc. 2. Weigh yourself on the same scale at same time of day and keep a log. 3. Call your doctor: (Anytime you feel any of the following symptoms)  - 3lb weight gain overnight or 5lb within a few days - Shortness of breath, with or without a dry hacking cough  - Swelling in the hands, feet or stomach  - If you have to sleep on extra pillows at night in order to breathe   IT IS IMPORTANT TO LET YOUR DOCTOR KNOW EARLY ON IF YOU ARE HAVING SYMPTOMS SO WE CAN HELP YOU!    Please increase dietary intake of healthy sources of magnesium and potassium including squash, leafy greens, nuts, seeds, fish, white beans, whole grains, avocados, yogurt, and bananas.   Entresto Sample (#28) Lot #: [id number] Exp: 7/24

## 2021-03-21 ENCOUNTER — Telehealth: Payer: Self-pay

## 2021-03-21 DIAGNOSIS — Z79899 Other long term (current) drug therapy: Secondary | ICD-10-CM

## 2021-03-21 MED ORDER — FUROSEMIDE 20 MG PO TABS: 20.0000 mg | ORAL_TABLET | Freq: Every day | ORAL | 3 refills | Status: DC

## 2021-03-21 NOTE — Telephone Encounter (Signed)
Left message to return call on home and cell numbers, order placed for [facility] BMET.

## 2021-03-21 NOTE — Telephone Encounter (Signed)
I spoke with patient and she will have BMET done tomorrow at [facility] out patient lab.

## 2021-03-21 NOTE — Telephone Encounter (Signed)
-----   Message from Charlie Pitter, [place] sent at 03/18/2021  5:35 PM EDT ----- I called and spoke with patient about labs. Post hospital rise in Cr noted for unclear reason. I asked her to hold her Lasix for now, but continue plan as instructed for the switch from losartan to [facility]. Recommend recheck BMET on Tuesday. Gave instructions to call if worsening swelling. Thanks

## 2021-03-22 ENCOUNTER — Other Ambulatory Visit: Payer: Self-pay

## 2021-03-22 ENCOUNTER — Other Ambulatory Visit (HOSPITAL_COMMUNITY)
Admission: RE | Admit: 2021-03-22 | Discharge: 2021-03-22 | Disposition: A | Discharge status: Home / Self Care | Payer: Medicare Other | Source: Ambulatory Visit | Attending: Physician Assistant | Admitting: Physician Assistant

## 2021-03-22 DIAGNOSIS — Z79899 Other long term (current) drug therapy: Secondary | ICD-10-CM | POA: Diagnosis present

## 2021-03-22 LAB — BASIC METABOLIC PANEL
Anion gap: 8 (ref 5–15)
BUN: 16 mg/dL (ref 8–23)
CO2: 26 mmol/L (ref 22–32)
Calcium: 9.4 mg/dL (ref 8.9–10.3)
Chloride: 106 mmol/L (ref 98–111)
Creatinine, Ser: 1 mg/dL (ref 0.44–1.00)
GFR, Estimated: 59 mL/min — ABNORMAL LOW (ref >60)
Glucose, Bld: 147 mg/dL — ABNORMAL HIGH (ref 70–99)
Potassium: 4.4 mmol/L (ref 3.5–5.1)
Sodium: 140 mmol/L (ref 135–145)

## 2021-03-24 ENCOUNTER — Telehealth: Payer: Self-pay | Admitting: Student

## 2021-03-24 NOTE — Telephone Encounter (Signed)
Result Notes   Levonne Hubert, LPN  06/13/9469 7:61 PM EDT Back to Top     Called patient with test results. No answer. Left message to call back.   Charlie Pitter, PA-C  03/22/2021 3:48 PM EDT      Please let pt know kidney function looks improved after holding the Lasix over the weekend. Did she stop the losartan and start the [facility] like we talked about? If so, how is she feeling / how is swelling / any significant weight changes? If she is feeling good, I would continue off the Lasix for now, continue the Entresto, and recheck BMET in 1 week. If she is noticing any worsening swelling or symptoms, can restart the Lasix 3x/week and recheck BMET 1 week.     03/24/21  Left message to return call.

## 2021-03-24 NOTE — Telephone Encounter (Signed)
Please call patient back again. She said Cathey had left her message to call re: results.

## 2021-03-25 ENCOUNTER — Telehealth: Payer: Self-pay

## 2021-03-25 NOTE — Telephone Encounter (Signed)
03/25/21 left message to return call.

## 2021-03-25 NOTE — Telephone Encounter (Signed)
Letter sent.

## 2021-03-25 NOTE — Telephone Encounter (Signed)
5/27- lmtcb -ab Called 3x Letter sent

## 2021-03-25 NOTE — Telephone Encounter (Signed)
-----   Message from Charlie Pitter, [place] sent at 03/22/2021  3:48 PM EDT ----- Please let pt know kidney function looks improved after holding the Lasix over the weekend. Did she stop the losartan and start the [facility] like we talked about? If so, how is she feeling / how is swelling / any significant weight changes? If she is feeling good, I would continue off the Lasix for now, continue the Entresto, and recheck BMET in 1 week. If she is noticing any worsening swelling or symptoms, can restart the Lasix 3x/week and recheck BMET 1 week.

## 2021-03-30 ENCOUNTER — Telehealth: Payer: Self-pay

## 2021-03-30 DIAGNOSIS — Z79899 Other long term (current) drug therapy: Secondary | ICD-10-CM

## 2021-03-30 NOTE — Telephone Encounter (Signed)
Orders for BMET placed per D. Idolina Primer, PA-C

## 2021-04-03 ENCOUNTER — Other Ambulatory Visit (HOSPITAL_COMMUNITY): Payer: Self-pay | Admitting: Hematology

## 2021-04-03 DIAGNOSIS — D473 Essential (hemorrhagic) thrombocythemia: Secondary | ICD-10-CM

## 2021-04-04 ENCOUNTER — Telehealth: Payer: Self-pay | Admitting: Family Medicine

## 2021-04-04 ENCOUNTER — Telehealth: Payer: Self-pay | Admitting: Cardiology

## 2021-04-04 MED ORDER — METOPROLOL SUCCINATE ER 100 MG PO TB24: 100.0000 mg | ORAL_TABLET | Freq: Two times a day (BID) | ORAL | 1 refills | Status: DC

## 2021-04-04 MED ORDER — FENOFIBRATE 160 MG PO TABS: 160.0000 mg | ORAL_TABLET | Freq: Every day | ORAL | 1 refills | Status: DC

## 2021-04-04 NOTE — Telephone Encounter (Signed)
LMOVM this prescription is done by Dr. Harl Bowie & he sent this in today

## 2021-04-04 NOTE — Telephone Encounter (Signed)
Refilled meds per pharmacy request

## 2021-04-04 NOTE — Telephone Encounter (Signed)
*  STAT* If patient is at the pharmacy, call can be transferred to refill team.   1. Which medications need to be refilled? (please list name of each medication and dose if known)   fenofibrate 160 MG tablet [[id number]]   metoprolol succinate (TOPROL-XL) 100 MG 24 hr tablet [[id number]]    2. Which pharmacy/location (including street and city if local pharmacy) is medication to be sent to? [facility]  3. Do they need a 30 day or 90 day supply?  90 day

## 2021-04-04 NOTE — Telephone Encounter (Signed)
  Prescription Request  04/04/2021  What is the name of the medication or equipment? fenofibrate 160 MG tablet   Have you contacted your pharmacy to request a refill? (if applicable) yes  Which pharmacy would you like this sent to? [facility] wants a 90 day supply   Patient notified that their request is being sent to the clinical staff for review and that they should receive a response within 2 business days.

## 2021-04-05 ENCOUNTER — Other Ambulatory Visit: Payer: Self-pay

## 2021-04-05 ENCOUNTER — Ambulatory Visit (INDEPENDENT_AMBULATORY_CARE_PROVIDER_SITE_OTHER): Payer: Medicare Other | Admitting: Pharmacist

## 2021-04-05 DIAGNOSIS — E1169 Type 2 diabetes mellitus with other specified complication: Secondary | ICD-10-CM | POA: Diagnosis not present

## 2021-04-05 NOTE — Progress Notes (Signed)
     04/05/2021 Name: Heather Barber MRN: [id number] DOB: 10/17/1946   S:  50 YOF Presents for medication management and medication assistance for Eliquis & entreso. Patient is currently on eliquis for atrial fibrillation.  She is stable and tolerating medications.  She has hit the coverage gap and is experiencing very high copays.   Insurance coverage/medication affordability: medicare/mutual of [place]   Patient reports adherence with medications.   GFR-59 (03/22/21)  CBC    Component Value Date/Time   WBC 5.8 03/18/2021 1428   RBC 2.57 (L) 03/18/2021 1428   HGB 10.5 (L) 03/18/2021 1428   HGB 12.6 01/16/2020 1536   HCT 31.3 (L) 03/18/2021 1428   HCT 33.5 (L) 01/16/2020 1536   PLT 327 03/18/2021 1428   PLT 341 01/16/2020 1536   MCV 121.8 (H) 03/18/2021 1428   MCV 104 (H) 01/16/2020 1536   MCH 40.9 (H) 03/18/2021 1428   MCHC 33.5 03/18/2021 1428   RDW 12.7 03/18/2021 1428   RDW 13.0 01/16/2020 1536   LYMPHSABS 2.3 12/06/2020 1341   LYMPHSABS 2.6 01/16/2020 1536   MONOABS 0.6 12/06/2020 1341   EOSABS 0.1 12/06/2020 1341   EOSABS 0.3 01/16/2020 1536   BASOSABS 0.0 12/06/2020 1341   BASOSABS 0.1 01/16/2020 1536      A/P: Patient has been compliant with Eliquis, but cost continues to be of concern --currently in coverage gap.  Will apply for eliquis assistance via [employer] and [employer] for [employer].   Information about your medication: Anticoagulant   Generic Name (Brand): Apixaban (Eliquis)   Apixaban is used to reduce the risk of forming blood clots that cause a stroke due to an irregular heartbeat.   Common SIDE EFFECTS you may experience include: extremity pain and increased risk of bleeding or bruising.   This drug must be taken consistently as prescribed to maintain its effect.   Multiple drug-drug interactions exist for this medication.  Consult healthcare professional prior to starting a new drug.   Tell your physicians and dentists that  you are taking this drug before elective surgery or invasive procedures and before any new drug is prescribed.   Contact your health care provider if you experience: any signs of blood loss or unusual bleeding.    Patient instructions provided.  Total time in face to face counseling 25 minutes.   Follow up needed by patient: Filled out [employer] Can attempt to get eliquis once patient gets me financial forms reviewed medications  Patient stable on current meds  Regina Eck, PharmD, BCPS Clinical Pharmacist, [place]  II Phone [phone]

## 2021-04-12 ENCOUNTER — Ambulatory Visit: Payer: Medicare Other | Admitting: Cardiology

## 2021-04-12 ENCOUNTER — Telehealth: Payer: Self-pay | Admitting: Family Medicine

## 2021-04-13 ENCOUNTER — Telehealth: Payer: Self-pay | Admitting: Family Medicine

## 2021-04-13 NOTE — Telephone Encounter (Signed)
Call returned Left VM encouraging call back

## 2021-04-15 ENCOUNTER — Encounter: Payer: Self-pay | Admitting: Student

## 2021-04-15 ENCOUNTER — Other Ambulatory Visit: Payer: Self-pay

## 2021-04-15 ENCOUNTER — Ambulatory Visit (INDEPENDENT_AMBULATORY_CARE_PROVIDER_SITE_OTHER): Payer: Medicare Other | Admitting: Student

## 2021-04-15 VITALS — BP 124/70 | HR 71 | Ht 64.0 in | Wt 164.2 lb

## 2021-04-15 DIAGNOSIS — I071 Rheumatic tricuspid insufficiency: Secondary | ICD-10-CM

## 2021-04-15 DIAGNOSIS — Q211 Atrial septal defect, unspecified: Secondary | ICD-10-CM

## 2021-04-15 DIAGNOSIS — I4819 Other persistent atrial fibrillation: Secondary | ICD-10-CM | POA: Diagnosis not present

## 2021-04-15 DIAGNOSIS — I10 Essential (primary) hypertension: Secondary | ICD-10-CM

## 2021-04-15 DIAGNOSIS — I5022 Chronic systolic (congestive) heart failure: Secondary | ICD-10-CM | POA: Diagnosis not present

## 2021-04-15 DIAGNOSIS — I340 Nonrheumatic mitral (valve) insufficiency: Secondary | ICD-10-CM

## 2021-04-15 NOTE — Progress Notes (Signed)
Cardiology Office Note    Date:  04/16/2021   ID:  Heather Barber, DOB May 09, 1946, MRN [id number]  PCP:  Dettinger, Fransisca Kaufmann, MD  Cardiologist: Carlyle Dolly, MD    Chief Complaint  Patient presents with   Follow-up    4 week visit    History of Present Illness:    Heather Barber is a 75 y.o. female with past medical history of palpitations (prior monitor showing SVT and reported history of VT with normal cath in 1994 in 2011), ASD (s/p repair in 1994), thrombocytosis, HTN, HLD, valvular heart disease and recently diagnosed atrial fibrillation with associated cardiomyopathy (diagnosed in 02/2021 with echo showing EF of 30-35%) who presents to the office today for 4-week follow-up  She was admitted to [facility] in 02/2021 for evaluation of chest pain and dyspnea on exertion and was found to be in atrial fibrillation with RVR. Echocardiogram showed a reduced EF of 30 to 35% along with moderate MR and moderate to severe TR. Underwent TEE/DCCV and converted to normal sinus rhythm but had recurrent atrial fibrillation/flutter and was started on IV Amiodarone during admission with conversion back to normal sinus rhythm. She did follow-up with Melina Copa, PA-C on 03/18/2021 and denied any recent chest pain or palpitations at that time. She was in normal sinus rhythm by repeat EKG and was continued on her Amiodarone taper along with Toprol-XL and Eliquis. ASA was discontinued given the need for anticoagulation. Losartan was also discontinued and she was started on Entresto 24-26 mg twice daily.  In talking with the patient today, she reports overall doing well since her last office visit. Reports her breathing is now back to baseline and she denies any dyspnea on exertion, orthopnea, PND or lower extremity edema. No recent chest pain or palpitations.  She reports good compliance with her current medication regimen but is concerned about the co-pay for Eliquis and Entresto going forward. Reports  she did recently mail in patient assistance paperwork for the medications but has not yet heard back in regards to approval.  Past Medical History:  Diagnosis Date   Anxiety    Atrial flutter ([place])    Atrial septal defect    Surgical repair in 1994   Cardiomyopathy [facility])    Chest pain    Normal coronary angiography in 1994 and 2011   Dizziness    Endometrial cancer, grade I ([place]) 10/09/2019   Essential thrombocythemia ([place]) 06/06/2016   GERD (gastroesophageal reflux disease)    History of kidney stones    Hyperlipidemia    Hypertension    LBBB (left bundle branch block) 2011   Mitral regurgitation    Palpitations    Persistent atrial fibrillation ([name])    PONV (postoperative nausea and vomiting)    Syncope    Tricuspid regurgitation    Type 2 diabetes mellitus with other specified complication ([place]) 4/31/5400   Ventricular tachycardia ([place])    Exercise induced    Past Surgical History:  Procedure Laterality Date   APPENDECTOMY     ASD [facility]   BREAST EXCISIONAL BIOPSY     Right and left   BREAST SURGERY     right and left breast-benign   CARDIAC CATHETERIZATION  12/24/2009   [name]    CARDIOVERSION N/A 03/04/2021   Procedure: CARDIOVERSION;  Surgeon: [name], MD;  Location: [facility] ORS;  Service: Endoscopy;  Laterality: N/A;   CATARACT EXTRACTION W/PHACO Left 03/11/2013  Procedure: CATARACT EXTRACTION PHACO AND INTRAOCULAR LENS PLACEMENT (IOC);  Surgeon: [name], MD;  Location: [facility] ORS;  Service: Ophthalmology;  Laterality: Left;  CDE:  12.20   HYSTEROSCOPY WITH D & C N/A 11/11/2019   Procedure: DILATATION AND CURETTAGE /HYSTEROSCOPY;  Surgeon: [name], MD;  Location: [facility] ORS;  Service: Gynecology;  Laterality: N/A;   POLYPECTOMY N/A 11/11/2019   Procedure: POLYPECTOMY(ENDOMETRIAL POLYP);  Surgeon: [name], MD;  Location: [facility] ORS;  Service: Gynecology;  Laterality: N/A;   ROBOTIC ASSISTED LAPAROSCOPIC HYSTERECTOMY  AND SALPINGECTOMY Bilateral 12/09/2019   Procedure: XI ROBOTIC ASSISTED LAPAROSCOPIC HYSTERECTOMY BILATERAL SALPINGOOOPHORECTOMY;  Surgeon: [name], MD;  Location: [facility] ORS;  Service: Gynecology;  Laterality: Bilateral;   SENTINEL NODE BIOPSY N/A 12/09/2019   Procedure: SENTINEL LYMPH  NODE BIOPSY;  Surgeon: [name], MD;  Location: [facility];  Service: Gynecology;  Laterality: N/A;   TEE WITHOUT CARDIOVERSION N/A 03/04/2021   Procedure: TRANSESOPHAGEAL ECHOCARDIOGRAM (TEE);  Surgeon: [name], MD;  Location: [facility] ORS;  Service: Endoscopy;  Laterality: N/A;   TONSILLECTOMY     TUBAL LIGATION     Bilateral    Current Medications: Outpatient Medications Prior to Visit  Medication Sig Dispense Refill   amiodarone (PACERONE) 200 MG tablet Take 200 mg by mouth daily.     apixaban (ELIQUIS) 5 MG TABS tablet Take 1 tablet (5 mg total) by mouth 2 (two) times daily. 60 tablet 3   atorvastatin (LIPITOR) 20 MG tablet Take 1 tablet (20 mg total) by mouth at bedtime. 90 tablet 3   fenofibrate 160 MG tablet Take 1 tablet (160 mg total) by mouth daily. 90 tablet 1   hydroxyurea (HYDREA) 500 MG capsule TAKE 1 CAPSULE BY MOUTH TWICE A DAY 180 capsule 1   ibuprofen (ADVIL) 600 MG tablet Take 1 tablet (600 mg total) by mouth every 6 (six) hours as needed for moderate pain. For AFTER surgery, use sparingly 30 tablet 0   ketoconazole (NIZORAL) 2 % cream Apply 1 application topically 2 (two) times daily. 60 g 0   metoprolol succinate (TOPROL-XL) 100 MG 24 hr tablet Take 1 tablet (100 mg total) by mouth 2 (two) times daily. Take with or immediately following a meal. 180 tablet 1   Multiple Vitamins-Minerals (MULTIVITAMIN WOMEN 50+) TABS Take by mouth daily.     naproxen sodium (ALEVE) 220 MG tablet Take 220 mg by mouth as needed.     omeprazole (PRILOSEC) 20 MG capsule Take 1 capsule (20 mg total) by mouth daily. 90 capsule 3   sacubitril-valsartan (ENTRESTO) 24-26 MG Take 1 tablet by mouth 2 (two) times  daily. 180 tablet 3   furosemide (LASIX) 20 MG tablet Take 1 tablet (20 mg total) by mouth daily. 90 tablet 3   amiodarone (PACERONE) 200 MG tablet Take 400mg  by mouth twice daily for seven days, followed by 200mg  by mouth twice daily for fourteen days, followed by 200mg  by mouth daily thereafter. 100 tablet 3   No facility-administered medications prior to visit.     Allergies:   Codeine and Morphine   Social History   Socioeconomic History   Marital status: Widowed    Spouse name: widowed   Number of children: 3   Years of education: Not on file   Highest education level: Not on file  Occupational History   Occupation: [employer]  Tobacco Use   Smoking status: Never   Smokeless tobacco: Never  Vaping Use   Vaping Use: Never  used  Substance and Sexual Activity   Alcohol use: No   Drug use: No   Sexual activity: Not Currently    Birth control/protection: Post-menopausal  Other Topics Concern   Not on file  Social History Narrative   Married and resides in [place] with husband and 3 children   Sedentary lifestyle   Social Determinants of Health   Financial Resource Strain: Not on file  Food Insecurity: Not on file  Transportation Needs: No Transportation Needs   Lack of Transportation (Medical): No   Lack of Transportation (Non-Medical): No  Physical Activity: Inactive   Days of Exercise per Week: 0 days   Minutes of Exercise per Session: 0 min  Stress: Not on file  Social Connections: Not on file     Family History:  The patient's family history includes Cancer in her mother and sister; Heart failure in her father; Stroke in her sister.   Review of Systems:    Please see the history of present illness.     All other systems reviewed and are otherwise negative except as noted above.   Physical Exam:    VS:  BP 124/70   Pulse 71   Ht 5\' 4"  (1.626 m)   Wt 164 lb 3.2 oz (74.5 kg)   SpO2 95%   BMI 28.18 kg/m    General: Well developed, well  nourished,female appearing in no acute distress. Head: Normocephalic, atraumatic. Neck: No carotid bruits. JVD not elevated.  Lungs: Respirations regular and unlabored, without wheezes or rales.  Heart: Regular rate and rhythm. No S3 or S4.  No murmur, no rubs, or gallops appreciated. Abdomen: Appears non-distended. No obvious abdominal masses. Msk:  Strength and tone appear normal for age. No obvious joint deformities or effusions. Extremities: No clubbing or cyanosis. No pitting edema.  Distal pedal pulses are 2+ bilaterally. Neuro: Alert and oriented X 3. Moves all extremities spontaneously. No focal deficits noted. Psych:  Responds to questions appropriately with a normal affect. Skin: No rashes or lesions noted  Wt Readings from Last 3 Encounters:  04/15/21 164 lb 3.2 oz (74.5 kg)  03/18/21 171 lb (77.6 kg)  03/17/21 171 lb (77.6 kg)     Studies/Labs Reviewed:   EKG:  EKG is not ordered today.    Recent Labs: 02/28/2021: ALT 23; B Natriuretic Peptide 599.0; TSH 3.423 03/05/2021: Magnesium 1.8 03/18/2021: Hemoglobin 10.5; Platelets 327 03/22/2021: BUN 16; Creatinine, Ser 1.00; Potassium 4.4; Sodium 140   Lipid Panel    Component Value Date/Time   CHOL 200 (H) 06/16/2020 1536   TRIG 646 (HH) 06/16/2020 1536   HDL 33 (L) 06/16/2020 1536   CHOLHDL 6.1 (H) 06/16/2020 1536   CHOLHDL 6.6 12/08/2017 0202   VLDL 73 (H) 12/08/2017 0202   LDLCALC 68 06/16/2020 1536    Additional studies/ records that were reviewed today include:   Echocardiogram: 02/2021 IMPRESSIONS     1. Left ventricular ejection fraction, by estimation, is 30 to 35%. The  left ventricle has moderately decreased function. The left ventricle  demonstrates global hypokinesis. There is mild left ventricular  hypertrophy. Left ventricular diastolic  parameters are indeterminate.   2. Right ventricular systolic function is low normal. The right  ventricular size is normal.   3. Left atrial size was moderately  dilated.   4. Right atrial size was moderately dilated.   5. The mitral valve is abnormal. Moderate mitral valve regurgitation. No  evidence of mitral stenosis.   6. Tricuspid valve regurgitation is moderate  to severe.   7. The aortic valve is tricuspid. There is moderate calcification of the  aortic valve. There is moderate thickening of the aortic valve. Aortic  valve regurgitation is not visualized. No aortic stenosis is present.   8. The inferior vena cava is dilated in size with <50% respiratory  variability, suggesting right atrial pressure of 15 mmHg.   Assessment:    1. Persistent atrial fibrillation ([place])   2. Chronic systolic CHF (congestive heart failure) ([place])   3. Essential hypertension   4. ASD (atrial septal defect)   5. Mitral valve insufficiency, unspecified etiology   6. Tricuspid valve insufficiency, unspecified etiology      Plan:   In order of problems listed above:  1. Persistent Atrial Fibrillation - Diagnosed in 02/2021 and underwent TEE/DCCV with recurrence and converted back to NSR with IV Amiodarone. She is maintaining NSR by examination today and reports her HR has been well-controlled when checked at home. She remains on Amiodarone 200mg  daily and Toprol-XL 100mg  BID. Would plan to recheck TSH and LFT's in 6 months if not rechecked by her PCP in the interim.  - She denies any evidence of active bleeding. Remains on Eliquis 5mg  BID for anticoagulation. Samples were provided today.   2. HFrEF - Echo during her admission showed her EF had declined to 30-35% and felt to be tachycardia-mediated in the setting of atrial fibrillation with RVR.  - She appears euvolemic by examination today and denies any orthopnea, PND or pitting edema.  - Continue Toprol-XL 100mg  BID and Entresto 24-26mg  BID. She is no longer having to take Lasix. Will plan for a repeat echo in 2 months. If EF remains reduced, would plan to add SGLT-2 inhibitor but cost might be a barrier given  co-pays for Entresto and Eliquis already unless approved for patient assistance. Could also consider adding Spironolactone at that time pending reassessment of her EF and if BP allows.   3. HTN - BP is well-controlled at 124/70 during today's visit. Continue current medication regimen for now.   4. History of ASD repair - No evidence of residual shunt by recent TEE.   5. Valvular Heart Disease - Recent echocardiogram did show moderate MR and moderate to severe TR. Will plan for repeat echocardiogram as outlined above.    Medication Adjustments/Labs and Tests Ordered: Current medicines are reviewed at length with the patient today.  Concerns regarding medicines are outlined above.  Medication changes, Labs and Tests ordered today are listed in the Patient Instructions below. Patient Instructions  Medication Instructions:   Continue current medication regimen. Samples were provided of Eliquis and Entresto.   Labwork:  None.  Testing/Procedures:  Echocardiogram in 2 months (Ultrasound of the heart to check the pumping function of the heart muscle).    Follow-Up:  With Bernerd Pho, PA-C or Dr. Harl Bowie in 2-3 months after echocardiogram has been obtained.   Any Other Special Instructions Will Be Listed Below (If Applicable).     If you need a refill on your cardiac medications before your next appointment, please call your pharmacy.   Signed, Erma Heritage, PA-C  04/16/2021 7:55 AM    [place] [address] [place], [place] [address] Phone: [phone] Fax: [phone]

## 2021-04-15 NOTE — Patient Instructions (Addendum)
Medication Instructions:   Continue current medication regimen. Samples were provided of Eliquis and Entresto.   Labwork:  None.  Testing/Procedures:  Echocardiogram in 2 months (Ultrasound of the heart to check the pumping function of the heart muscle).    Follow-Up:  With Bernerd Pho, PA-C or Dr. Harl Bowie in 2-3 months after echocardiogram has been obtained.   Any Other Special Instructions Will Be Listed Below (If Applicable).     If you need a refill on your cardiac medications before your next appointment, please call your pharmacy.

## 2021-04-16 ENCOUNTER — Encounter: Payer: Self-pay | Admitting: Student

## 2021-04-19 ENCOUNTER — Ambulatory Visit (INDEPENDENT_AMBULATORY_CARE_PROVIDER_SITE_OTHER): Payer: Medicare Other | Admitting: Pharmacist

## 2021-04-19 ENCOUNTER — Other Ambulatory Visit: Payer: Self-pay

## 2021-04-19 DIAGNOSIS — I4819 Other persistent atrial fibrillation: Secondary | ICD-10-CM

## 2021-04-19 NOTE — Progress Notes (Signed)
      04/19/2021 Name: Heather Barber            MRN: [id number]       DOB: 29-Mar-1946     S:  64 YOF Presents for medication management and medication assistance for Eliquis & entreso. Patient is currently on eliquis for atrial fibrillation/entresto for heart failure.  She is stable and tolerating medications.  She has hit the coverage gap and is experiencing very high copays.   Insurance coverage/medication affordability: medicare/mutual of [place]   Patient reports adherence with medications.   GFR-59 (03/22/21)   CBC Labs (Brief)          Component Value Date/Time    WBC 5.8 03/18/2021 1428    RBC 2.57 (L) 03/18/2021 1428    HGB 10.5 (L) 03/18/2021 1428    HGB 12.6 01/16/2020 1536    HCT 31.3 (L) 03/18/2021 1428    HCT 33.5 (L) 01/16/2020 1536    PLT 327 03/18/2021 1428    PLT 341 01/16/2020 1536    MCV 121.8 (H) 03/18/2021 1428    MCV 104 (H) 01/16/2020 1536    MCH 40.9 (H) 03/18/2021 1428    MCHC 33.5 03/18/2021 1428    RDW 12.7 03/18/2021 1428    RDW 13.0 01/16/2020 1536    LYMPHSABS 2.3 12/06/2020 1341    LYMPHSABS 2.6 01/16/2020 1536    MONOABS 0.6 12/06/2020 1341    EOSABS 0.1 12/06/2020 1341    EOSABS 0.3 01/16/2020 1536    BASOSABS 0.0 12/06/2020 1341    BASOSABS 0.1 01/16/2020 1536          A/P: Patient has been compliant with Eliquis, but cost continues to be of concern --currently in coverage gap.  Will apply for eliquis assistance via [employer] and [employer] for [employer].   Information about your medication: Anticoagulant   Generic Name (Brand): Apixaban (Eliquis)   Apixaban is used to reduce the risk of forming blood clots that cause a stroke due to an irregular heartbeat.   Common SIDE EFFECTS you may experience include: extremity pain and increased risk of bleeding or bruising.   This drug must be taken consistently as prescribed to maintain its effect.   Multiple drug-drug interactions exist for this medication.  Consult  healthcare professional prior to starting a new drug.   Tell your physicians and dentists that you are taking this drug before elective surgery or invasive procedures and before any new drug is prescribed.   Contact your health care provider if you experience: any signs of blood loss or unusual bleeding.    Patient instructions provided.  Total time in face to face counseling 25 minutes.     Follow up needed by patient: Patient approved for [employer], but pharmacy didn't process--called with patient, now $0 ($[phone] grant) Application submitted for eliquis--unsure of patient credit--will submit to company to see how much OOP she needs Reviewed medications Patient stable on current meds   Regina Eck, PharmD, BCPS Clinical Pharmacist, [place]  II Phone [phone]

## 2021-04-25 ENCOUNTER — Encounter: Payer: Self-pay | Admitting: Nurse Practitioner

## 2021-04-25 ENCOUNTER — Ambulatory Visit (INDEPENDENT_AMBULATORY_CARE_PROVIDER_SITE_OTHER): Payer: Medicare Other | Admitting: Nurse Practitioner

## 2021-04-25 DIAGNOSIS — Z91199 Patient's noncompliance with other medical treatment and regimen due to unspecified reason: Secondary | ICD-10-CM | POA: Insufficient documentation

## 2021-04-25 DIAGNOSIS — Z5329 Procedure and treatment not carried out because of patient's decision for other reasons: Secondary | ICD-10-CM

## 2021-04-26 ENCOUNTER — Ambulatory Visit (INDEPENDENT_AMBULATORY_CARE_PROVIDER_SITE_OTHER): Payer: Medicare Other | Admitting: Nurse Practitioner

## 2021-04-26 ENCOUNTER — Encounter: Payer: Self-pay | Admitting: Nurse Practitioner

## 2021-04-26 DIAGNOSIS — B370 Candidal stomatitis: Secondary | ICD-10-CM | POA: Insufficient documentation

## 2021-04-26 MED ORDER — NYSTATIN 100000 UNIT/ML MT SUSP: 5.0000 mL | Freq: Four times a day (QID) | OROMUCOSAL | 2 refills | Status: DC

## 2021-04-26 NOTE — Assessment & Plan Note (Signed)
Mouth sores/lesions observed by patient in the last few days and mouth cavity and throat causing sore throat and oral pain.  Tylenol/ibuprofen for pain, nystatin mouthwash. Rx sent to pharmacy. Follow-up with worsening or unresolved symptoms.

## 2021-04-26 NOTE — Progress Notes (Signed)
   Virtual Visit  Note Due to COVID-19 pandemic this visit was conducted virtually. This visit type was conducted due to national recommendations for restrictions regarding the COVID-19 Pandemic (e.g. social distancing, sheltering in place) in an effort to limit this patient's exposure and mitigate transmission in our community. All issues noted in this document were discussed and addressed.  A physical exam was not performed with this format.  I connected with Heather Barber on 04/26/21 at 8:35 AM by telephone and verified that I am speaking with the correct person using two identifiers. Heather Barber is currently located at home during visit. The provider, Ivy Lynn, NP is located in their office at time of visit.  I discussed the limitations, risks, security and privacy concerns of performing an evaluation and management service by telephone and the availability of in person appointments. I also discussed with the patient that there may be a patient responsible charge related to this service. The patient expressed understanding and agreed to proceed.   History and Present Illness:  Mouth Lesions  The current episode started 5 to 7 days ago. The onset was gradual. The problem has been gradually worsening. The problem is moderate. Nothing relieves the symptoms. Nothing aggravates the symptoms. Associated symptoms include mouth sores and sore throat. Pertinent negatives include no fever, no cough and no rash. There were no sick contacts.     Review of Systems  Constitutional:  Negative for fever.  HENT:  Positive for mouth sores and sore throat.   Respiratory:  Negative for cough.   Skin:  Negative for rash.  All other systems reviewed and are negative.   Observations/Objective: Telephone visit patient not in distress.  Assessment and Plan: Tylenol/ibuprofen for pain, nystatin mouth wash ordered  Follow Up Instructions: Follow-up with worsening unresolved symptoms.    I  discussed the assessment and treatment plan with the patient. The patient was provided an opportunity to ask questions and all were answered. The patient agreed with the plan and demonstrated an understanding of the instructions.   The patient was advised to call back or seek an in-person evaluation if the symptoms worsen or if the condition fails to improve as anticipated.  The above assessment and management plan was discussed with the patient. The patient verbalized understanding of and has agreed to the management plan. Patient is aware to call the clinic if symptoms persist or worsen. Patient is aware when to return to the clinic for a follow-up visit. Patient educated on when it is appropriate to go to the emergency department.   Time call ended: 8:45 AM  I provided 10 minutes of  non face-to-face time during this encounter.    Ivy Lynn, NP

## 2021-06-03 NOTE — Progress Notes (Signed)
Heather Barber, [place] [address]   CLINIC:  Medical Oncology/Hematology  PCP:  Dettinger, Fransisca Kaufmann, MD [place] [address] [phone]   REASON FOR VISIT:  Follow-up for JAK2 positive essential thrombocytosis  PRIOR THERAPY: None  CURRENT THERAPY: Hydrea 500 mg twice daily  INTERVAL HISTORY:  Heather Barber 75 y.o. female returns for routine follow-up of her essential thrombocytosis.  She was last seen by Dr. Delton Coombes on 12/06/2020.  At today's visit, she reports feeling well overall.  No recent hospitalizations, surgeries, or changes in baseline health status.  She is tolerating Hydrea well.  CBC shows expected macrocytosis.  Patient denies cutaneous ulcers and nonhealing skin wounds.  Denies gastrointestinal symptoms such as gastritis, nausea, vomiting, diarrhea.  Energy levels have been good.  She does report that she had some mouth sores last month, but she says that these were due to having teeth pulled and having a denture plate put in.  She reports that her oral ulcers have now healed.  She denies any erythromelalgia, aquagenic pruritus, Raynaud's phenomenon, or vasomotor symptoms.  No signs or symptoms of DVT or PE.  No B symptoms such as fever, chills, night sweats, unintentional weight loss.  She denies any pelvic pain or vaginal bleeding, in light of her history of previous endometrial cancer.  Regarding her anemia, she denies any fatigue, chest pain, dyspnea on exertion, syncopal episodes.  She has not noticed any bleeding such as epistaxis, hematemesis, hematochezia, or melena.  She has 100% energy and 100% appetite. She endorses that she is maintaining a stable weight.    REVIEW OF SYSTEMS:  Review of Systems  Constitutional:  Negative for appetite change, chills, diaphoresis, fatigue, fever and unexpected weight change.  HENT:   Negative for lump/mass and nosebleeds.   Eyes:  Negative for eye problems.  Respiratory:   Negative for cough, hemoptysis and shortness of breath.   Cardiovascular:  Negative for chest pain, leg swelling and palpitations.  Gastrointestinal:  Negative for abdominal pain, blood in stool, constipation, diarrhea, nausea and vomiting.  Genitourinary:  Negative for hematuria.   Skin: Negative.   Neurological:  Negative for dizziness, headaches and light-headedness.  Hematological:  Does not bruise/bleed easily.     PAST MEDICAL/SURGICAL HISTORY:  Past Medical History:  Diagnosis Date   Anxiety    Atrial flutter ([place])    Atrial septal defect    Surgical repair in 1994   Cardiomyopathy [facility])    Chest pain    Normal coronary angiography in 1994 and 2011   Dizziness    Endometrial cancer, grade I ([place]) 10/09/2019   Essential thrombocythemia ([place]) 06/06/2016   GERD (gastroesophageal reflux disease)    History of kidney stones    Hyperlipidemia    Hypertension    LBBB (left bundle branch block) 2011   Mitral regurgitation    Palpitations    Persistent atrial fibrillation ([name])    PONV (postoperative nausea and vomiting)    Syncope    Tricuspid regurgitation    Type 2 diabetes mellitus with other specified complication ([place]) 5/36/6440   Ventricular tachycardia ([place])    Exercise induced   Past Surgical History:  Procedure Laterality Date   APPENDECTOMY     ASD [facility]   BREAST EXCISIONAL BIOPSY     Right and left   BREAST SURGERY     right and left breast-benign   CARDIAC CATHETERIZATION  12/24/2009  [name]    CARDIOVERSION N/A 03/04/2021   Procedure: CARDIOVERSION;  Surgeon: [name], MD;  Location: [facility];  Service: Endoscopy;  Laterality: N/A;   CATARACT EXTRACTION W/PHACO Left 03/11/2013   Procedure: CATARACT EXTRACTION PHACO AND INTRAOCULAR LENS PLACEMENT (IOC);  Surgeon: [name], MD;  Location: [facility];  Service: Ophthalmology;  Laterality: Left;  CDE:  12.20   HYSTEROSCOPY WITH D & C N/A 11/11/2019   Procedure:  DILATATION AND CURETTAGE /HYSTEROSCOPY;  Surgeon: [name], MD;  Location: [facility];  Service: Gynecology;  Laterality: N/A;   POLYPECTOMY N/A 11/11/2019   Procedure: POLYPECTOMY(ENDOMETRIAL POLYP);  Surgeon: [name], MD;  Location: [facility];  Service: Gynecology;  Laterality: N/A;   ROBOTIC ASSISTED LAPAROSCOPIC HYSTERECTOMY AND SALPINGECTOMY Bilateral 12/09/2019   Procedure: XI ROBOTIC ASSISTED LAPAROSCOPIC HYSTERECTOMY BILATERAL SALPINGOOOPHORECTOMY;  Surgeon: [name], MD;  Location: [facility];  Service: Gynecology;  Laterality: Bilateral;   SENTINEL NODE BIOPSY N/A 12/09/2019   Procedure: SENTINEL LYMPH  NODE BIOPSY;  Surgeon: [name], MD;  Location: [facility];  Service: Gynecology;  Laterality: N/A;   TEE WITHOUT CARDIOVERSION N/A 03/04/2021   Procedure: TRANSESOPHAGEAL ECHOCARDIOGRAM (TEE);  Surgeon: [name], MD;  Location: [facility];  Service: Endoscopy;  Laterality: N/A;   TONSILLECTOMY     TUBAL LIGATION     Bilateral     SOCIAL HISTORY:  Social History   Socioeconomic History   Marital status: Widowed    Spouse name: widowed   Number of children: 3   Years of education: Not on file   Highest education level: Not on file  Occupational History   Occupation: [employer]  Tobacco Use   Smoking status: Never   Smokeless tobacco: Never  Vaping Use   Vaping Use: Never used  Substance and Sexual Activity   Alcohol use: No   Drug use: No   Sexual activity: Not Currently    Birth control/protection: Post-menopausal  Other Topics Concern   Not on file  Social History Narrative   Married and resides in [place] with husband and 3 children   Sedentary lifestyle   Social Determinants of Health   Financial Resource Strain: Not on file  Food Insecurity: Not on file  Transportation Needs: No Transportation Needs   Lack of Transportation (Medical): No   Lack of Transportation (Non-Medical): No  Physical Activity: Inactive   Days of Exercise per Week: 0  days   Minutes of Exercise per Session: 0 min  Stress: Not on file  Social Connections: Not on file  Intimate Partner Violence: Not At Risk   Fear of Current or Ex-Partner: No   Emotionally Abused: No   Physically Abused: No   Sexually Abused: No    FAMILY HISTORY:  Family History  Problem Relation Age of Onset   Cancer Mother        female   Stroke Sister    Cancer Sister        breast   Heart failure Father    Coronary artery disease Neg Hx     CURRENT MEDICATIONS:  Outpatient Encounter Medications as of 06/06/2021  Medication Sig   amiodarone (PACERONE) 200 MG tablet Take 200 mg by mouth daily.   apixaban (ELIQUIS) 5 MG TABS tablet Take 1 tablet (5 mg total) by mouth 2 (two) times daily.   atorvastatin (LIPITOR) 20 MG tablet Take 1 tablet (20 mg total) by mouth at bedtime.   fenofibrate 160 MG tablet Take 1  tablet (160 mg total) by mouth daily.   hydroxyurea (HYDREA) 500 MG capsule TAKE 1 CAPSULE BY MOUTH TWICE A DAY   ibuprofen (ADVIL) 600 MG tablet Take 1 tablet (600 mg total) by mouth every 6 (six) hours as needed for moderate pain. For AFTER surgery, use sparingly   ketoconazole (NIZORAL) 2 % cream Apply 1 application topically 2 (two) times daily.   metoprolol succinate (TOPROL-XL) 100 MG 24 hr tablet Take 1 tablet (100 mg total) by mouth 2 (two) times daily. Take with or immediately following a meal.   Multiple Vitamins-Minerals (MULTIVITAMIN WOMEN 50+) TABS Take by mouth daily.   naproxen sodium (ALEVE) 220 MG tablet Take 220 mg by mouth as needed.   nystatin (MYCOSTATIN) 100000 UNIT/ML suspension Take 5 mLs (500,000 Units total) by mouth 4 (four) times daily.   omeprazole (PRILOSEC) 20 MG capsule Take 1 capsule (20 mg total) by mouth daily.   sacubitril-valsartan (ENTRESTO) 24-26 MG Take 1 tablet by mouth 2 (two) times daily.   No facility-administered encounter medications on file as of 06/06/2021.    ALLERGIES:  Allergies  Allergen Reactions   Codeine Nausea And  Vomiting   Morphine Other (See Comments)    GI symptoms     PHYSICAL EXAM:  ECOG PERFORMANCE STATUS: 0 - Asymptomatic  There were no vitals filed for this visit. There were no vitals filed for this visit. Physical Exam Constitutional:      Appearance: Normal appearance. She is obese.  HENT:     Head: Normocephalic and atraumatic.     Mouth/Throat:     Mouth: Mucous membranes are moist.  Eyes:     Extraocular Movements: Extraocular movements intact.     Pupils: Pupils are equal, round, and reactive to light.  Cardiovascular:     Rate and Rhythm: Normal rate and regular rhythm.     Pulses: Normal pulses.     Heart sounds: Normal heart sounds.  Pulmonary:     Effort: Pulmonary effort is normal.     Breath sounds: Normal breath sounds.  Abdominal:     General: Bowel sounds are normal.     Palpations: Abdomen is soft.     Tenderness: There is no abdominal tenderness.  Musculoskeletal:        General: No swelling.     Right lower leg: No edema.     Left lower leg: No edema.  Lymphadenopathy:     Cervical: No cervical adenopathy.  Skin:    General: Skin is warm and dry.  Neurological:     General: No focal deficit present.     Mental Status: She is alert and oriented to person, place, and time.  Psychiatric:        Mood and Affect: Mood normal.        Behavior: Behavior normal.     LABORATORY DATA:  I have reviewed the labs as listed.  CBC    Component Value Date/Time   WBC 5.8 03/18/2021 1428   RBC 2.57 (L) 03/18/2021 1428   HGB 10.5 (L) 03/18/2021 1428   HGB 12.6 01/16/2020 1536   HCT 31.3 (L) 03/18/2021 1428   HCT 33.5 (L) 01/16/2020 1536   PLT 327 03/18/2021 1428   PLT 341 01/16/2020 1536   MCV 121.8 (H) 03/18/2021 1428   MCV 104 (H) 01/16/2020 1536   MCH 40.9 (H) 03/18/2021 1428   MCHC 33.5 03/18/2021 1428   RDW 12.7 03/18/2021 1428   RDW 13.0 01/16/2020 1536   LYMPHSABS 2.3  12/06/2020 1341   LYMPHSABS 2.6 01/16/2020 1536   MONOABS 0.6 12/06/2020  1341   EOSABS 0.1 12/06/2020 1341   EOSABS 0.3 01/16/2020 1536   BASOSABS 0.0 12/06/2020 1341   BASOSABS 0.1 01/16/2020 1536   CMP Latest Ref Rng & Units 03/22/2021 03/18/2021 03/05/2021  Glucose 70 - 99 mg/dL 147(H) 112(H) 122(H)  BUN 8 - 23 mg/dL 16 24(H) 20  Creatinine 0.44 - 1.00 mg/dL 1.00 1.22(H) 0.80  Sodium 135 - 145 mmol/L 140 141 140  Potassium 3.5 - 5.1 mmol/L 4.4 4.7 3.6  Chloride 98 - 111 mmol/L 106 101 103  CO2 22 - 32 mmol/L _0 Calcium 8.9 - 10.3 mg/dL 9.4 9.2 9.4  Total Protein 6.5 - 8.1 g/dL - - -  Total Bilirubin 0.3 - 1.2 mg/dL - - -  Alkaline Phos 38 - 126 U/L - - -  AST 15 - 41 U/L - - -  ALT 0 - 44 U/L - - -    DIAGNOSTIC IMAGING:  I have independently reviewed the relevant imaging and discussed with the patient.  ASSESSMENT & PLAN: 1.  JAK2 positive essential thrombocytosis - High risk because of her advanced age. - Hydroxyurea 500 mg twice daily, currently tolerating well - No prior history of thrombosis. - She does not report any aquagenic pruritus or vasomotor symptoms.   - Most recent labs (06/06/2021): Platelets 260, Hgb 11.1 with MCV 123.2, WBC 3.8 - PLAN:  Decrease Hydrea to 500 mg BID every other day, with 500 mg once daily on alternating days.  RTC 3 months for repeat labs and follow-up.  2.  Anemia - CBC (06/06/2021): With mildly decreased Hgb 11.1, MCV 123.2 (macrocytosis secondary to Hydrea) - May have some element of anemia secondary to Hydrea - No bright red blood per rectum or melena - PLAN:  Decrease Hydrea to 500 mg BID every other day, with 500 mg once daily on alternating days.  Labs in 3 months with basic anemia work-up.  Follow-up visit 1 week after labs.    3.  Stage Ia grade 1 endometrioid adenocarcinoma - Status post robotic staging on 12/09/2019. - MMR normal. - Due to low risk of recurrence, no adjuvant therapy was recommended. - No pelvic pain, B symptoms, or abnormal vaginal bleeding at this time - PLAN: Continue follow-up  with Dr. Denman George every 6 months for 5 years.   PLAN SUMMARY & DISPOSITION: -Decreased dose of Hydrea due to anemia - Labs in 3 months (including anemia panel) - RTC after labs  All questions were answered. The patient knows to call the clinic with any problems, questions or concerns.  Medical decision making: Moderate  Time spent on visit: I spent 15 minutes counseling the patient face to face. The total time spent in the appointment was 25 minutes and more than 50% was on counseling.   Harriett Rush, PA-C  06/06/2021 11:42 AM

## 2021-06-06 ENCOUNTER — Inpatient Hospital Stay (HOSPITAL_COMMUNITY): Payer: Medicare Other | Attending: Hematology

## 2021-06-06 ENCOUNTER — Other Ambulatory Visit (HOSPITAL_COMMUNITY): Payer: Self-pay | Admitting: Family Medicine

## 2021-06-06 ENCOUNTER — Other Ambulatory Visit (HOSPITAL_COMMUNITY): Payer: Medicare Other

## 2021-06-06 ENCOUNTER — Ambulatory Visit (HOSPITAL_COMMUNITY): Payer: Medicare Other | Admitting: Hematology

## 2021-06-06 ENCOUNTER — Other Ambulatory Visit: Payer: Self-pay

## 2021-06-06 ENCOUNTER — Inpatient Hospital Stay (HOSPITAL_BASED_OUTPATIENT_CLINIC_OR_DEPARTMENT_OTHER): Payer: Medicare Other | Admitting: Physician Assistant

## 2021-06-06 VITALS — BP 143/67 | HR 65 | Temp 96.8°F | Resp 17 | Wt 163.5 lb

## 2021-06-06 DIAGNOSIS — D7589 Other specified diseases of blood and blood-forming organs: Secondary | ICD-10-CM | POA: Diagnosis not present

## 2021-06-06 DIAGNOSIS — Z1231 Encounter for screening mammogram for malignant neoplasm of breast: Secondary | ICD-10-CM

## 2021-06-06 DIAGNOSIS — D649 Anemia, unspecified: Secondary | ICD-10-CM | POA: Insufficient documentation

## 2021-06-06 DIAGNOSIS — D539 Nutritional anemia, unspecified: Secondary | ICD-10-CM

## 2021-06-06 DIAGNOSIS — D473 Essential (hemorrhagic) thrombocythemia: Secondary | ICD-10-CM | POA: Insufficient documentation

## 2021-06-06 DIAGNOSIS — C541 Malignant neoplasm of endometrium: Secondary | ICD-10-CM | POA: Insufficient documentation

## 2021-06-06 DIAGNOSIS — D75839 Thrombocytosis, unspecified: Secondary | ICD-10-CM | POA: Insufficient documentation

## 2021-06-06 DIAGNOSIS — Z79899 Other long term (current) drug therapy: Secondary | ICD-10-CM | POA: Insufficient documentation

## 2021-06-06 LAB — CBC WITH DIFFERENTIAL/PLATELET
Abs Immature Granulocytes: 0.02 10*3/uL (ref 0.00–0.07)
Basophils Absolute: 0 10*3/uL (ref 0.0–0.1)
Basophils Relative: 1 %
Eosinophils Absolute: 0.1 10*3/uL (ref 0.0–0.5)
Eosinophils Relative: 2 %
HCT: 32.4 % — ABNORMAL LOW (ref 36.0–46.0)
Hemoglobin: 11.1 g/dL — ABNORMAL LOW (ref 12.0–15.0)
Immature Granulocytes: 1 %
Lymphocytes Relative: 28 %
Lymphs Abs: 1.1 10*3/uL (ref 0.7–4.0)
MCH: 42.2 pg — ABNORMAL HIGH (ref 26.0–34.0)
MCHC: 34.3 g/dL (ref 30.0–36.0)
MCV: 123.2 fL — ABNORMAL HIGH (ref 80.0–100.0)
Monocytes Absolute: 0.3 10*3/uL (ref 0.1–1.0)
Monocytes Relative: 9 %
Neutro Abs: 2.3 10*3/uL (ref 1.7–7.7)
Neutrophils Relative %: 59 %
Platelets: 260 10*3/uL (ref 150–400)
RBC: 2.63 MIL/uL — ABNORMAL LOW (ref 3.87–5.11)
RDW: 13.5 % (ref 11.5–15.5)
WBC: 3.8 10*3/uL — ABNORMAL LOW (ref 4.0–10.5)
nRBC: 0 % (ref 0.0–0.2)

## 2021-06-06 LAB — COMPREHENSIVE METABOLIC PANEL
ALT: 14 U/L (ref 0–44)
AST: 17 U/L (ref 15–41)
Albumin: 4.1 g/dL (ref 3.5–5.0)
Alkaline Phosphatase: 55 U/L (ref 38–126)
Anion gap: 6 (ref 5–15)
BUN: 19 mg/dL (ref 8–23)
CO2: 28 mmol/L (ref 22–32)
Calcium: 9.3 mg/dL (ref 8.9–10.3)
Chloride: 105 mmol/L (ref 98–111)
Creatinine, Ser: 0.88 mg/dL (ref 0.44–1.00)
GFR, Estimated: >60 mL/min (ref >60)
Glucose, Bld: 122 mg/dL — ABNORMAL HIGH (ref 70–99)
Potassium: 4.9 mmol/L (ref 3.5–5.1)
Sodium: 139 mmol/L (ref 135–145)
Total Bilirubin: 0.8 mg/dL (ref 0.3–1.2)
Total Protein: 7.1 g/dL (ref 6.5–8.1)

## 2021-06-06 LAB — LACTATE DEHYDROGENASE: LDH: 182 U/L (ref 98–192)

## 2021-06-06 NOTE — Patient Instructions (Signed)
Heather Barber at [facility] Discharge Instructions  You were seen today by Tarri Abernethy PA-C for your essential thrombocytosis (elevated platelets).  Your platelets are within their target range, but your red blood cells are slightly low.  Because of this, we will DECREASE the dose of your Hydrea.  You will take Hydrea 1 tablet (500 mg) TWICE a day EVERY OTHER DAY, but will only take it once per day on the alternating days.  We will also check additional labs at your next visit to see if there are any other reasons you are mildly anemic (such as low vitamin or mineral levels).  LABS: Return in 3 months for repeat labs  OTHER TESTS: None at this time  MEDICATIONS: DECREASE the dose of your Hydrea.  You will take Hydrea 1 tablet (500 mg) TWICE a day EVERY OTHER DAY, but will only take it once per day on the alternating days.  FOLLOW-UP APPOINTMENT: Office visit in 3 months (1 week after labs)   Thank you for choosing [facility] to provide your oncology and hematology care.  To afford each patient quality time with our provider, please arrive at least 15 minutes before your scheduled appointment time.   If you have a lab appointment with the [facility] please come in thru the Main Entrance and check in at the main information desk.  You need to re-schedule your appointment should you arrive 10 or more minutes late.  We strive to give you quality time with our providers, and arriving late affects you and other patients whose appointments are after yours.  Also, if you no show three or more times for appointments you may be dismissed from the clinic at the providers discretion.     Again, thank you for choosing [facility].  Our hope is that these requests will decrease the amount of time that you wait before being seen by our physicians.       _____________________________________________________________  Should you  have questions after your visit to [facility], please contact our office at [phone] and follow the prompts.  Our office hours are 8:00 a.m. and 4:30 p.m. Monday - Friday.  Please note that voicemails left after 4:00 p.m. may not be returned until the following business day.  We are closed weekends and major holidays.  You do have access to a nurse 24-7, just call the main number to the clinic [phone] and do not press any options, hold on the line and a nurse will answer the phone.    For prescription refill requests, have your pharmacy contact our office and allow 72 hours.    Due to Covid, you will need to wear a mask upon entering the hospital. If you do not have a mask, a mask will be given to you at the Main Entrance upon arrival. For doctor visits, patients may have 1 support person age 72 or older with them. For treatment visits, patients can not have anyone with them due to social distancing guidelines and our immunocompromised population.

## 2021-06-07 ENCOUNTER — Telehealth: Payer: Self-pay

## 2021-06-07 NOTE — Telephone Encounter (Signed)
Pt was to follow up with Dr. Glo Herring this August and Dr. Denman George in Feb. 2023.  Dr. Glo Herring has retired. Pt scheduled to see Dr. Denman George on 06/29/21 at 1445.

## 2021-06-15 ENCOUNTER — Encounter: Payer: Self-pay | Admitting: Family Medicine

## 2021-06-15 ENCOUNTER — Other Ambulatory Visit: Payer: Self-pay

## 2021-06-15 ENCOUNTER — Ambulatory Visit (INDEPENDENT_AMBULATORY_CARE_PROVIDER_SITE_OTHER): Payer: Medicare Other | Admitting: Family Medicine

## 2021-06-15 VITALS — BP 119/60 | HR 65 | Ht 64.0 in | Wt 166.0 lb

## 2021-06-15 DIAGNOSIS — R399 Unspecified symptoms and signs involving the genitourinary system: Secondary | ICD-10-CM

## 2021-06-15 DIAGNOSIS — E1169 Type 2 diabetes mellitus with other specified complication: Secondary | ICD-10-CM

## 2021-06-15 DIAGNOSIS — E781 Pure hyperglyceridemia: Secondary | ICD-10-CM

## 2021-06-15 DIAGNOSIS — E782 Mixed hyperlipidemia: Secondary | ICD-10-CM

## 2021-06-15 DIAGNOSIS — Z1211 Encounter for screening for malignant neoplasm of colon: Secondary | ICD-10-CM | POA: Diagnosis not present

## 2021-06-15 DIAGNOSIS — I10 Essential (primary) hypertension: Secondary | ICD-10-CM

## 2021-06-15 LAB — BAYER DCA HB A1C WAIVED: HB A1C (BAYER DCA - WAIVED): 6.5 % (ref <7.0)

## 2021-06-15 LAB — URINALYSIS
Bilirubin, UA: NEGATIVE
Glucose, UA: NEGATIVE
Nitrite, UA: NEGATIVE
Specific Gravity, UA: >1.03 — ABNORMAL HIGH (ref 1.005–1.030)
Urobilinogen, Ur: 1 mg/dL (ref 0.2–1.0)
pH, UA: 5.5 (ref 5.0–7.5)

## 2021-06-15 MED ORDER — SULFAMETHOXAZOLE-TRIMETHOPRIM 800-160 MG PO TABS: 1.0000 | ORAL_TABLET | Freq: Two times a day (BID) | ORAL | 0 refills | Status: DC

## 2021-06-15 NOTE — Progress Notes (Signed)
BP 119/60   Pulse 65   Ht '5\' 4"'$  (1.626 m)   Wt 166 lb (75.3 kg)   SpO2 95%   BMI 28.49 kg/m    Subjective:   [name], female    DOB: June 28, 1946, 75 y.o.   MRN: [id number]  HPI: Heather Barber is a 75 y.o. female presenting on 06/15/2021 for Medical Management of Chronic Issues and Diabetes   HPI Type 2 diabetes mellitus Patient comes in today for recheck of his diabetes. Patient has been currently taking patient is diet control, A1c 6.5. Patient is currently on an ACE inhibitor/ARB. Patient has not seen an ophthalmologist this year. Patient denies any issues with their feet. The symptom started onset as an adult hypertension and hyperlipidemia ARE RELATED TO DM   Hypertension Patient is currently on amiodarone and Entresto and metoprolol, and their blood pressure today is 119/60. Patient denies any lightheadedness or dizziness. Patient denies headaches, blurred vision, chest pains, shortness of breath, or weakness. Denies any side effects from medication and is content with current medication.   Hyperlipidemia and hypertriglyceridemia Patient is coming in for recheck of his hyperlipidemia. The patient is currently taking atorvastatin and fenofibrate. They deny any issues with myalgias or history of liver damage from it. They deny any focal numbness or weakness or chest pain.   Patient comes in complaining of urinary frequency and urgency that is been going on since yesterday.  She denies any hematuria or fevers or chills or flank pain.  She denies any vaginal discharge or irritation or symptoms.  Relevant past medical, surgical, family and social history reviewed and updated as indicated. Interim medical history since our last visit reviewed. Allergies and medications reviewed and updated.  Review of Systems  Constitutional:  Negative for chills and fever.  HENT:  Negative for congestion, ear discharge and ear pain.   Eyes:  Negative for redness and visual  disturbance.  Respiratory:  Negative for chest tightness and shortness of breath.   Cardiovascular:  Negative for chest pain and leg swelling.  Gastrointestinal:  Negative for abdominal pain.  Genitourinary:  Positive for dysuria, frequency and urgency. Negative for difficulty urinating, flank pain, hematuria, vaginal bleeding, vaginal discharge and vaginal pain.  Musculoskeletal:  Negative for back pain and gait problem.  Skin:  Negative for rash.  Neurological:  Negative for light-headedness and headaches.  Psychiatric/Behavioral:  Negative for agitation and behavioral problems.   All other systems reviewed and are negative.  Per HPI unless specifically indicated above   Allergies as of 06/15/2021       Reactions   Codeine Nausea And Vomiting   Morphine Other (See Comments)   GI symptoms        Medication List        Accurate as of June 15, 2021  4:36 PM. If you have any questions, ask your nurse or doctor.          amiodarone 200 MG tablet Commonly known as: PACERONE Take 200 mg by mouth daily.   apixaban 5 MG Tabs tablet Commonly known as: ELIQUIS Take 1 tablet (5 mg total) by mouth 2 (two) times daily.   atorvastatin 20 MG tablet Commonly known as: LIPITOR Take 1 tablet (20 mg total) by mouth at bedtime.   fenofibrate 160 MG tablet Take 1 tablet (160 mg total) by mouth daily.   hydroxyurea 500 MG capsule Commonly known as: HYDREA TAKE 1 CAPSULE BY MOUTH TWICE A DAY   ibuprofen  600 MG tablet Commonly known as: ADVIL Take 1 tablet (600 mg total) by mouth every 6 (six) hours as needed for moderate pain. For AFTER surgery, use sparingly   ketoconazole 2 % cream Commonly known as: NIZORAL Apply 1 application topically 2 (two) times daily.   metoprolol succinate 100 MG 24 hr tablet Commonly known as: TOPROL-XL Take 1 tablet (100 mg total) by mouth 2 (two) times daily. Take with or immediately following a meal.   Multivitamin Women 50+ Tabs Take by  mouth daily.   naproxen sodium 220 MG tablet Commonly known as: ALEVE Take 220 mg by mouth as needed.   nystatin 100000 UNIT/ML suspension Commonly known as: MYCOSTATIN Take 5 mLs (500,000 Units total) by mouth 4 (four) times daily.   omeprazole 20 MG capsule Commonly known as: PRILOSEC Take 1 capsule (20 mg total) by mouth daily.   sacubitril-valsartan 24-26 MG Commonly known as: ENTRESTO Take 1 tablet by mouth 2 (two) times daily.   sulfamethoxazole-trimethoprim 800-160 MG tablet Commonly known as: BACTRIM DS Take 1 tablet by mouth 2 (two) times daily. Started by: Fransisca Kaufmann Hjalmar Ballengee, MD         Objective:   BP 119/60   Pulse 65   Ht '5\' 4"'$  (1.626 m)   Wt 166 lb (75.3 kg)   SpO2 95%   BMI 28.49 kg/m   Wt Readings from Last 3 Encounters:  06/15/21 166 lb (75.3 kg)  06/06/21 163 lb 8 oz (74.2 kg)  04/15/21 164 lb 3.2 oz (74.5 kg)    Physical Exam Vitals and nursing note reviewed.  Constitutional:      General: She is not in acute distress.    Appearance: She is well-developed. She is not diaphoretic.  Eyes:     Conjunctiva/sclera: Conjunctivae normal.  Cardiovascular:     Rate and Rhythm: Normal rate and regular rhythm.     Heart sounds: Normal heart sounds. No murmur heard. Pulmonary:     Effort: Pulmonary effort is normal. No respiratory distress.     Breath sounds: Normal breath sounds. No wheezing.  Abdominal:     General: Abdomen is flat. Bowel sounds are normal. There is no distension.     Tenderness: There is no abdominal tenderness. There is no guarding or rebound.  Musculoskeletal:        General: No tenderness. Normal range of motion.  Skin:    General: Skin is warm and dry.     Findings: No rash.  Neurological:     Mental Status: She is alert and oriented to person, place, and time.     Coordination: Coordination normal.  Psychiatric:        Behavior: Behavior normal.    Urinalysis shows 1+ ketones and 3+ blood and 2+ protein and 1+  leukocytes nitrite negative.  Await culture  A1c 6.5, looks good  Assessment & Plan:   Problem List Items Addressed This Visit       Cardiovascular and Mediastinum   Hypertension     Endocrine   Type 2 diabetes mellitus with other specified complication ([place]) - Primary   Relevant Orders   Bayer DCA Hb A1c Waived   Urine Culture   Urinalysis     Other   Hyperlipidemia   Hypertriglyceridemia   Other Visit Diagnoses     UTI symptoms       Relevant Medications   sulfamethoxazole-trimethoprim (BACTRIM DS) 800-160 MG tablet   Other Relevant Orders   Urine Culture   Urinalysis  Colon cancer screening       Relevant Orders   Cologuard     Sent Bactrim for UTI, otherwise no changes, seems to be doing well.  She continues to follow-up with cardiologist.  Follow up plan: Return in about 3 months (around 09/15/2021), or if symptoms worsen or fail to improve, for Diabetes.  Counseling provided for all of the vaccine components Orders Placed This Encounter  Procedures   Urine Culture   Bayer DCA Hb A1c Waived   Urinalysis   Cologuard    Caryl Pina, MD [facility] 06/15/2021, 4:36 PM

## 2021-06-16 ENCOUNTER — Ambulatory Visit (HOSPITAL_COMMUNITY)
Admission: RE | Admit: 2021-06-16 | Discharge: 2021-06-16 | Disposition: A | Discharge status: Home / Self Care | Payer: Medicare Other | Source: Ambulatory Visit | Attending: Student | Admitting: Student

## 2021-06-16 ENCOUNTER — Ambulatory Visit (HOSPITAL_COMMUNITY)
Admission: RE | Admit: 2021-06-16 | Discharge: 2021-06-16 | Disposition: A | Discharge status: Home / Self Care | Payer: Medicare Other | Source: Ambulatory Visit | Attending: Family Medicine | Admitting: Family Medicine

## 2021-06-16 DIAGNOSIS — Z1231 Encounter for screening mammogram for malignant neoplasm of breast: Secondary | ICD-10-CM | POA: Diagnosis present

## 2021-06-16 DIAGNOSIS — I5022 Chronic systolic (congestive) heart failure: Secondary | ICD-10-CM

## 2021-06-16 LAB — ECHOCARDIOGRAM COMPLETE
AR max vel: 1.44 cm2
AV Area VTI: 1.46 cm2
AV Area mean vel: 1.58 cm2
AV Mean grad: 9 mmHg
AV Peak grad: 16.2 mmHg
Ao pk vel: 2.01 m/s
Area-P 1/2: 3.28 cm2
MV M vel: 5.19 m/s
MV Peak grad: 107.7 mmHg
Radius: 0.6 cm
S' Lateral: 3.6 cm

## 2021-06-16 NOTE — Progress Notes (Signed)
*  PRELIMINARY RESULTS* Echocardiogram 2D Echocardiogram has been performed.  Samuel Germany 06/16/2021, 3:49 PM

## 2021-06-17 LAB — URINE CULTURE

## 2021-06-20 ENCOUNTER — Ambulatory Visit: Payer: Medicare Other

## 2021-06-29 ENCOUNTER — Encounter: Payer: Self-pay | Admitting: Gynecologic Oncology

## 2021-06-29 ENCOUNTER — Inpatient Hospital Stay: Payer: Medicare Other | Attending: Gynecologic Oncology | Admitting: Gynecologic Oncology

## 2021-06-29 ENCOUNTER — Other Ambulatory Visit: Payer: Self-pay

## 2021-06-29 VITALS — BP 131/66 | HR 69 | Temp 98.7°F | Resp 18 | Ht 64.0 in | Wt 163.9 lb

## 2021-06-29 DIAGNOSIS — Z8542 Personal history of malignant neoplasm of other parts of uterus: Secondary | ICD-10-CM | POA: Diagnosis present

## 2021-06-29 DIAGNOSIS — Z90722 Acquired absence of ovaries, bilateral: Secondary | ICD-10-CM | POA: Diagnosis not present

## 2021-06-29 DIAGNOSIS — Z90710 Acquired absence of both cervix and uterus: Secondary | ICD-10-CM | POA: Diagnosis not present

## 2021-06-29 DIAGNOSIS — C541 Malignant neoplasm of endometrium: Secondary | ICD-10-CM

## 2021-06-29 NOTE — Progress Notes (Signed)
Gynecologic Oncology Follow-up  Chief Complaint:  Chief Complaint  Patient presents with   Endometrial cancer [facility])    Assessment and Plan:  Ms Oliveira is a 75 year old woman with a history of stage IA grade 1 endometrioid endometrial adenocarcinoma, s/p robotic staging on 12/09/19. MMR normal/MMR stable  Pathology revealed low risk factors for recurrence, therefore no adjuvant therapy was recommended according to NCCN guidelines.  I discussed risk for recurrence and typical symptoms encouraged her to notify us of these should they develop between visits.  I recommend she have follow-up every 6 months for 5 years in accordance with NCCN guidelines. Those visits should include symptom assessment, physical exam and pelvic examination. Pap smears are not indicated or recommended in the routine surveillance of endometrial cancer.  She will follow-up in 6 months with Dr Delsa Sale or Joylene John NP, as I am leaving the practice.    HPI: 75 year old P4 who was seen in consultation at the request of Dr Glo Herring for evaluation of grade 1 endometrioid endometrial cancer.  The patient reported postmenopausal bleeding that began in February 2020.  This stopped a few months after began and she did not seek medical care for in 2020 until reemerged in December 2020.  She then saw Dr. Glo Herring who performed a transvaginal ultrasound scan which showed a uterus measuring 5.3 x 3.7 x 4.3 cm with an endometrial stripe of 16 mm which was enlarged.  The ovaries were grossly normal.  Due to the endometrial thickening she went to the operating room on November 11, 2019 where a D&C was performed and endometrial polypectomy.  Final pathology from that procedure revealed FIGO grade 1 endometrioid endometrial adenocarcinoma.  Interval Hx:  On 12/09/19 she underwent a robotic assisted total hysterectomy with BSO and SLN biopsy. Intraoperative findings were significant for bilateral mapping, normal appearing uterus.  Surgery was uncomplicated.  Final pathology revealed a FIGO grade 1 adenocarcinoma of the endometrium (endometrioid), 7 of 49m myo invasion (inner half), no LVSI, no cervical/adnexal/nodal involvement. MSI stable/MMR normal. It met low risk features for recurrence and therefore no adjuvant therapy was recommended in accordance with NCCN guidelines.  Since surgery she has done well with no complaints.  She has no symptoms concerning for recurrence.   Current Meds:  Outpatient Encounter Medications as of 06/29/2021  Medication Sig   amiodarone (PACERONE) 200 MG tablet Take 200 mg by mouth daily.   apixaban (ELIQUIS) 5 MG TABS tablet Take 1 tablet (5 mg total) by mouth 2 (two) times daily.   atorvastatin (LIPITOR) 20 MG tablet Take 1 tablet (20 mg total) by mouth at bedtime.   hydroxyurea (HYDREA) 500 MG capsule TAKE 1 CAPSULE BY MOUTH TWICE A DAY   ibuprofen (ADVIL) 600 MG tablet Take 1 tablet (600 mg total) by mouth every 6 (six) hours as needed for moderate pain. For AFTER surgery, use sparingly   ketoconazole (NIZORAL) 2 % cream Apply 1 application topically 2 (two) times daily.   Multiple Vitamins-Minerals (MULTIVITAMIN WOMEN 50+) TABS Take by mouth daily.   naproxen sodium (ALEVE) 220 MG tablet Take 220 mg by mouth as needed.   nystatin (MYCOSTATIN) 100000 UNIT/ML suspension Take 5 mLs (500,000 Units total) by mouth 4 (four) times daily.   omeprazole (PRILOSEC) 20 MG capsule Take 1 capsule (20 mg total) by mouth daily.   sacubitril-valsartan (ENTRESTO) 24-26 MG Take 1 tablet by mouth 2 (two) times daily.   sulfamethoxazole-trimethoprim (BACTRIM DS) 800-160 MG tablet Take 1 tablet by mouth  2 (two) times daily.   fenofibrate 160 MG tablet Take 1 tablet (160 mg total) by mouth daily.   metoprolol succinate (TOPROL-XL) 100 MG 24 hr tablet Take 1 tablet (100 mg total) by mouth 2 (two) times daily. Take with or immediately following a meal.   No facility-administered encounter medications on  file as of 06/29/2021.    Allergy:  Allergies  Allergen Reactions   Codeine Nausea And Vomiting   Morphine Other (See Comments)    GI symptoms    Social Hx:   Social History   Socioeconomic History   Marital status: Widowed    Spouse name: widowed   Number of children: 3   Years of education: Not on file   Highest education level: Not on file  Occupational History   Occupation: [employer]  Tobacco Use   Smoking status: Never   Smokeless tobacco: Never  Vaping Use   Vaping Use: Never used  Substance and Sexual Activity   Alcohol use: No   Drug use: No   Sexual activity: Not Currently    Birth control/protection: Post-menopausal  Other Topics Concern   Not on file  Social History Narrative   Married and resides in [place] with husband and 3 children   Sedentary lifestyle   Social Determinants of Health   Financial Resource Strain: Not on file  Food Insecurity: Not on file  Transportation Needs: No Transportation Needs   Lack of Transportation (Medical): No   Lack of Transportation (Non-Medical): No  Physical Activity: Inactive   Days of Exercise per Week: 0 days   Minutes of Exercise per Session: 0 min  Stress: Not on file  Social Connections: Not on file  Intimate Partner Violence: Not At Risk   Fear of Current or Ex-Partner: No   Emotionally Abused: No   Physically Abused: No   Sexually Abused: No    Past Surgical Hx:  Past Surgical History:  Procedure Laterality Date   APPENDECTOMY     ASD [facility]   BREAST EXCISIONAL BIOPSY     Right and left   BREAST SURGERY     right and left breast-benign   CARDIAC CATHETERIZATION  12/24/2009   [name]    CARDIOVERSION N/A 03/04/2021   Procedure: CARDIOVERSION;  Surgeon: [name], MD;  Location: [facility];  Service: Endoscopy;  Laterality: N/A;   CATARACT EXTRACTION W/PHACO Left 03/11/2013   Procedure: CATARACT EXTRACTION PHACO AND INTRAOCULAR LENS PLACEMENT (IOC);   Surgeon: [name], MD;  Location: [facility];  Service: Ophthalmology;  Laterality: Left;  CDE:  12.20   HYSTEROSCOPY WITH D & C N/A 11/11/2019   Procedure: DILATATION AND CURETTAGE /HYSTEROSCOPY;  Surgeon: [name], MD;  Location: [facility];  Service: Gynecology;  Laterality: N/A;   POLYPECTOMY N/A 11/11/2019   Procedure: POLYPECTOMY(ENDOMETRIAL POLYP);  Surgeon: [name], MD;  Location: [facility];  Service: Gynecology;  Laterality: N/A;   ROBOTIC ASSISTED LAPAROSCOPIC HYSTERECTOMY AND SALPINGECTOMY Bilateral 12/09/2019   Procedure: XI ROBOTIC ASSISTED LAPAROSCOPIC HYSTERECTOMY BILATERAL SALPINGOOOPHORECTOMY;  Surgeon: [name], MD;  Location: [facility];  Service: Gynecology;  Laterality: Bilateral;   SENTINEL NODE BIOPSY N/A 12/09/2019   Procedure: SENTINEL LYMPH  NODE BIOPSY;  Surgeon: [name], MD;  Location: [facility];  Service: Gynecology;  Laterality: N/A;   TEE WITHOUT CARDIOVERSION N/A 03/04/2021   Procedure: TRANSESOPHAGEAL ECHOCARDIOGRAM (TEE);  Surgeon: [name], MD;  Location: [facility];  Service: Endoscopy;  Laterality: N/A;   TONSILLECTOMY     TUBAL LIGATION     Bilateral    Past Medical Hx:  Past Medical History:  Diagnosis Date   Anxiety    Atrial flutter ([place])    Atrial septal defect    Surgical repair in 1994   Cardiomyopathy [facility])    Chest pain    Normal coronary angiography in 1994 and 2011   Dizziness    Endometrial cancer, grade I ([place]) 10/09/2019   Essential thrombocythemia ([place]) 06/06/2016   GERD (gastroesophageal reflux disease)    History of kidney stones    Hyperlipidemia    Hypertension    LBBB (left bundle branch block) 2011   Mitral regurgitation    Palpitations    Persistent atrial fibrillation ([name])    PONV (postoperative nausea and vomiting)    Syncope    Tricuspid regurgitation    Type 2 diabetes mellitus with other specified complication ([place]) 6/75/4492   Ventricular tachycardia ([name])    Exercise induced    Past Gynecological  History:  SVD x 4 No LMP recorded. Patient has had a hysterectomy.  Family Hx:  Family History  Problem Relation Age of Onset   Cancer Mother        female   Stroke Sister    Cancer Sister        breast   Heart failure Father    Coronary artery disease Neg Hx     Review of Systems:  Constitutional  Feels well,    ENT Normal appearing ears and nares bilaterally Skin/Breast  No rash, sores, jaundice, itching, dryness Cardiovascular  No chest pain, shortness of breath, or edema  Pulmonary  No cough or wheeze.  Gastro Intestinal  No nausea, vomitting, or diarrhoea. No bright red blood per rectum, no abdominal pain, change in bowel movement, or constipation.  Genito Urinary  No frequency, urgency, dysuria, no bleeding Musculo Skeletal  No myalgia, arthralgia, joint swelling or pain  Neurologic  No weakness, numbness, change in gait,  Psychology  No depression, anxiety, insomnia.   Vitals:  Blood pressure 131/66, pulse 69, temperature 98.7 F (37.1 C), temperature source Oral, resp. rate 18, height '5\' 4"'  (1.626 m), weight 163 lb 14.4 oz (74.3 kg), SpO2 98 %.  Physical Exam: WD in NAD Neck  Supple NROM, without any enlargements.  Lymph Node Survey No cervical supraclavicular or inguinal adenopathy Cardiovascular  Well perfused peripheries Lungs  No increased WOB Skin  No rash/lesions/breakdown  Psychiatry  Alert and oriented to person, place, and time  Abdomen  Normoactive bowel sounds, abdomen soft, non-tender and obese without evidence of hernia. Well healed incisions. Back No CVA tenderness Genito Urinary  Vulva/vagina: Normal external female genitalia.  No lesions. No discharge or bleeding.  Bladder/urethra:  No lesions or masses, well supported bladder, nontender.   Vagina: smooth, no lesions.  Rectal  deferred Extremities  No bilateral cyanosis, clubbing or edema.  Thereasa Solo, MD  06/29/2021, 5:47 PM

## 2021-06-29 NOTE — Patient Instructions (Signed)
Please notify Dr Denman George at phone number [phone] if you notice vaginal bleeding, new pelvic or abdominal pains, bloating, feeling full easy, or a change in bladder or bowel function.   Dr Denman George is departing the [place] at [facility] in October, 2022. Her partners and colleagues including Dr Berline Lopes, Dr Delsa Sale and Joylene John, Nurse Practitioner will be available to continue your care.   You are next scheduled to return to the Gynecologic Oncology office at the [facility] in February, 2023. Please call [phone] in December to request an appointment for February with Dr Serita Grit partners Dr Delsa Sale or Joylene John, NP.

## 2021-07-20 NOTE — Progress Notes (Signed)
Cardiology Office Note    Date:  07/21/2021   ID:  Heather Barber, DOB July 02, 1946, MRN [id number]  PCP:  Dettinger, Fransisca Kaufmann, MD  Cardiologist: Carlyle Dolly, MD    Chief Complaint  Patient presents with   Follow-up    3 month visit    History of Present Illness:    Heather Barber is a 75 y.o. female with past medical history of palpitations (prior monitor showing SVT and reported history of VT with normal cath in 1994 in 2011), persistent atrial fibrillation (s/p DCCV in 02/2021), ASD (s/p repair in 1994), thrombocytosis, endometrial cancer, HTN, HLD, valvular heart disease and HFrEF (EF 30-35% in 02/2021 in the setting of new-onset atrial fibrillation with RVR) who presents to the office today for 49-month follow-up.   She was last examined by myself in 03/2021 and reported overall feeling well at that time and denied any significant dyspnea on exertion or palpitations.Was continued on Amiodarone 200mg  daily along with Toprol-XL for her atrial fibrillation and Eliquis for anticoagulation. She was taking Entresto 24-26mg  BID as well for her cardiomyopathy with plans for a repeat echo in 2 months. Repeat echo in 05/2021 showed her EF had improved to 55-60% with no regional WMA. RV function was normal and she did have moderate MR and moderate TR which had improved from prior imaging.   In talking with the patient today, she reports overall feeling well since her last office visit. She continues to work full-time and denies any recent anginal symptoms. No recurrent palpitations.  No reported orthopnea, PND or pitting edema.  She was able to get patient assistance for her medications and reports they are now affordable. She reports good compliance with Eliquis and denies any evidence of active bleeding.   Past Medical History:  Diagnosis Date   (HFpEF) heart failure with preserved ejection fraction ([place])    a. EF previously 30-35% in 02/2021 in the setting of afib w/RVR --> normalized  by repeat imaging in 05/2021.   Anxiety    Atrial flutter ([place])    a. s/p DCCV in 02/2021   Atrial septal defect    Surgical repair in 1994   Cardiomyopathy [facility])    Chest pain    Normal coronary angiography in 1994 and 2011   Dizziness    Endometrial cancer, grade I ([place]) 10/09/2019   Essential thrombocythemia ([place]) 06/06/2016   GERD (gastroesophageal reflux disease)    History of kidney stones    Hyperlipidemia    Hypertension    LBBB (left bundle branch block) 2011   Mitral regurgitation    Palpitations    Persistent atrial fibrillation ([name])    PONV (postoperative nausea and vomiting)    Syncope    Tricuspid regurgitation    Type 2 diabetes mellitus with other specified complication ([place]) 54/65/0354   Ventricular tachycardia ([place])    Exercise induced    Past Surgical History:  Procedure Laterality Date   APPENDECTOMY     ASD [facility]   BREAST EXCISIONAL BIOPSY     Right and left   BREAST SURGERY     right and left breast-benign   CARDIAC CATHETERIZATION  12/24/2009   [name]    CARDIOVERSION N/A 03/04/2021   Procedure: CARDIOVERSION;  Surgeon: [name], MD;  Location: [facility] ORS;  Service: Endoscopy;  Laterality: N/A;   CATARACT EXTRACTION W/PHACO Left 03/11/2013   Procedure: CATARACT EXTRACTION PHACO AND INTRAOCULAR LENS PLACEMENT (IOC);  Surgeon:  Mark T. Gershon Crane, MD;  Location: [facility] ORS;  Service: Ophthalmology;  Laterality: Left;  CDE:  12.20   HYSTEROSCOPY WITH D & C N/A 11/11/2019   Procedure: DILATATION AND CURETTAGE /HYSTEROSCOPY;  Surgeon: [name], MD;  Location: [facility] ORS;  Service: Gynecology;  Laterality: N/A;   POLYPECTOMY N/A 11/11/2019   Procedure: POLYPECTOMY(ENDOMETRIAL POLYP);  Surgeon: [name], MD;  Location: [facility]S;  Service: Gynecology;  Laterality: N/A;   ROBOTIC ASSISTED LAPAROSCOPIC HYSTERECTOMY AND SALPINGECTOMY Bilateral 12/09/2019   Procedure: XI ROBOTIC ASSISTED LAPAROSCOPIC HYSTERECTOMY BILATERAL  SALPINGOOOPHORECTOMY;  Surgeon: [name], MD;  Location: [facility];  Service: Gynecology;  Laterality: Bilateral;   SENTINEL NODE BIOPSY N/A 12/09/2019   Procedure: SENTINEL LYMPH  NODE BIOPSY;  Surgeon: [name], MD;  Location: [facility];  Service: Gynecology;  Laterality: N/A;   TEE WITHOUT CARDIOVERSION N/A 03/04/2021   Procedure: TRANSESOPHAGEAL ECHOCARDIOGRAM (TEE);  Surgeon: [name], MD;  Location: [facility];  Service: Endoscopy;  Laterality: N/A;   TONSILLECTOMY     TUBAL LIGATION     Bilateral    Current Medications: Outpatient Medications Prior to Visit  Medication Sig Dispense Refill   amiodarone (PACERONE) 200 MG tablet Take 200 mg by mouth daily.     apixaban (ELIQUIS) 5 MG TABS tablet Take 1 tablet (5 mg total) by mouth 2 (two) times daily. 60 tablet 3   atorvastatin (LIPITOR) 20 MG tablet Take 1 tablet (20 mg total) by mouth at bedtime. 90 tablet 3   fenofibrate 160 MG tablet Take 1 tablet (160 mg total) by mouth daily. 90 tablet 1   hydroxyurea (HYDREA) 500 MG capsule TAKE 1 CAPSULE BY MOUTH TWICE A DAY 180 capsule 1   ibuprofen (ADVIL) 600 MG tablet Take 1 tablet (600 mg total) by mouth every 6 (six) hours as needed for moderate pain. For AFTER surgery, use sparingly 30 tablet 0   ketoconazole (NIZORAL) 2 % cream Apply 1 application topically 2 (two) times daily. 60 g 0   metoprolol succinate (TOPROL-XL) 100 MG 24 hr tablet Take 1 tablet (100 mg total) by mouth 2 (two) times daily. Take with or immediately following a meal. 180 tablet 1   Multiple Vitamins-Minerals (MULTIVITAMIN WOMEN 50+) TABS Take by mouth daily.     naproxen sodium (ALEVE) 220 MG tablet Take 220 mg by mouth as needed.     nystatin (MYCOSTATIN) 100000 UNIT/ML suspension Take 5 mLs (500,000 Units total) by mouth 4 (four) times daily. 60 mL 2   omeprazole (PRILOSEC) 20 MG capsule Take 1 capsule (20 mg total) by mouth daily. 90 capsule 3   sacubitril-valsartan (ENTRESTO) 24-26 MG Take 1 tablet by mouth 2  (two) times daily. 180 tablet 3   sulfamethoxazole-trimethoprim (BACTRIM DS) 800-160 MG tablet Take 1 tablet by mouth 2 (two) times daily. 20 tablet 0   No facility-administered medications prior to visit.     Allergies:   Codeine and Morphine   Social History   Socioeconomic History   Marital status: Widowed    Spouse name: widowed   Number of children: 3   Years of education: Not on file   Highest education level: Not on file  Occupational History   Occupation: [employer]  Tobacco Use   Smoking status: Never   Smokeless tobacco: Never  Vaping Use   Vaping Use: Never used  Substance and Sexual Activity   Alcohol use: No   Drug use: No   Sexual activity: Not Currently  Birth control/protection: Post-menopausal  Other Topics Concern   Not on file  Social History Narrative   Married and resides in [place] with husband and 3 children   Sedentary lifestyle   Social Determinants of Health   Financial Resource Strain: Not on file  Food Insecurity: Not on file  Transportation Needs: No Transportation Needs   Lack of Transportation (Medical): No   Lack of Transportation (Non-Medical): No  Physical Activity: Inactive   Days of Exercise per Week: 0 days   Minutes of Exercise per Session: 0 min  Stress: Not on file  Social Connections: Not on file     Family History:  The patient's family history includes Cancer in her mother and sister; Heart failure in her father; Stroke in her sister.   Review of Systems:    Please see the history of present illness.     All other systems reviewed and are otherwise negative except as noted above.   Physical Exam:    VS:  BP (!) 126/58   Pulse 68   Ht 5\' 3"  (1.6 m)   Wt 167 lb (75.8 kg)   SpO2 94%   BMI 29.58 kg/m    General: Well developed, well nourished,female appearing in no acute distress. Head: Normocephalic, atraumatic. Neck: No carotid bruits. JVD not elevated.  Lungs: Respirations regular and unlabored,  without wheezes or rales.  Heart: Regular rate and rhythm. No S3 or S4.  No murmur, no rubs, or gallops appreciated. Abdomen: Appears non-distended. No obvious abdominal masses. Msk:  Strength and tone appear normal for age. No obvious joint deformities or effusions. Extremities: No clubbing or cyanosis. No pitting edema.  Distal pedal pulses are 2+ bilaterally. Neuro: Alert and oriented X 3. Moves all extremities spontaneously. No focal deficits noted. Psych:  Responds to questions appropriately with a normal affect. Skin: No rashes or lesions noted  Wt Readings from Last 3 Encounters:  07/21/21 167 lb (75.8 kg)  06/29/21 163 lb 14.4 oz (74.3 kg)  06/15/21 166 lb (75.3 kg)     Studies/Labs Reviewed:   EKG:  EKG is not ordered today.   Recent Labs: 02/28/2021: B Natriuretic Peptide 599.0; TSH 3.423 03/05/2021: Magnesium 1.8 06/06/2021: ALT 14; BUN 19; Creatinine, Ser 0.88; Hemoglobin 11.1; Platelets 260; Potassium 4.9; Sodium 139   Lipid Panel    Component Value Date/Time   CHOL 200 (H) 06/16/2020 1536   TRIG 646 (HH) 06/16/2020 1536   HDL 33 (L) 06/16/2020 1536   CHOLHDL 6.1 (H) 06/16/2020 1536   CHOLHDL 6.6 12/08/2017 0202   VLDL 73 (H) 12/08/2017 0202   LDLCALC 68 06/16/2020 1536    Additional studies/ records that were reviewed today include:   Echocardiogram: 05/2021 IMPRESSIONS     1. Left ventricular ejection fraction, by estimation, is 55 to 60%. The  left ventricle has normal function. The left ventricle has no regional  wall motion abnormalities. There is mild left ventricular hypertrophy.  Left ventricular diastolic parameters  were normal.   2. Right ventricular systolic function is normal. The right ventricular  size is normal. There is normal pulmonary artery systolic pressure. The  estimated right ventricular systolic pressure is 94.8 mmHg.   3. Left atrial size was mildly dilated.   4. Right atrial size was moderately dilated.   5. The mitral valve is  grossly normal. Moderate mitral valve  regurgitation.   6. Tricuspid valve regurgitation is moderate.   7. The aortic valve is tricuspid. There is mild calcification of the  aortic valve. Aortic valve regurgitation is not visualized. Mild to  moderate aortic valve sclerosis/calcification is present, without any  evidence of aortic stenosis. Aortic valve mean   gradient measures 9.0 mmHg. Aortic valve Vmax measures 2.01 m/s.   8. The inferior vena cava is normal in size with greater than 50%  respiratory variability, suggesting right atrial pressure of 3 mmHg.   Comparison(s): Prior images reviewed side by side. LVEF has improved  significantly.   Assessment:    1. Persistent atrial fibrillation ([place])   2. History of cardiomyopathy   3. Essential hypertension   4. Mitral valve insufficiency, unspecified etiology      Plan:   In order of problems listed above:  1. Persistent Atrial Fibrillation - She underwent prior cardioversion in 02/2021 and denies any recurrent palpitations in the interim. She remains on aAmiodarone 200 mg daily along with Toprol-XL 100 mg twice daily.  LFT's were within a normal range in 05/2021. I will add on a TSH to her next set of labs given the use of Amiodarone. - She denies any evidence of active bleeding and remains on Eliquis 5 mg twice daily for anticoagulation.  2. HFimpEF - Her EF was previously at 30 to 35% by echocardiogram in 02/2021, improved to 55 to 60% by repeat imaging last month. She appears euvolemic on examination today and denies any recent orthopnea, PND or pitting edema. Continue Toprol-XL 100 mg twice daily along with Entresto 24-26 mg twice daily.  We reviewed that if Delene Loll becomes unaffordable, can switch to an ARB alone.   3. HTN - Her BP is well controlled at 126/58 during today's visit. Will continue her current medication regimen.  4. Valvular Heart Disease - Recent echocardiogram showed moderate MR and moderate TR with her  TR having improved when compared to prior imaging.  Will continue to follow and anticipate repeat imaging in 1 year.  Medication Adjustments/Labs and Tests Ordered: Current medicines are reviewed at length with the patient today.  Concerns regarding medicines are outlined above.  Medication changes, Labs and Tests ordered today are listed in the Patient Instructions below. Patient Instructions  Medication Instructions:  Your physician recommends that you continue on your current medications as directed. Please refer to the Current Medication list given to you today.  *If you need a refill on your cardiac medications before your next appointment, please call your pharmacy*   Lab Work: NONE   If you have labs (blood work) drawn today and your tests are completely normal, you will receive your results only by: [place] (if you have MyChart) OR A paper copy in the mail If you have any lab test that is abnormal or we need to change your treatment, we will call you to review the results.   Testing/Procedures: NONE  Follow-Up: At [facility], you and your health needs are our priority.  As part of our continuing mission to provide you with exceptional heart care, we have created designated Provider Care Teams.  These Care Teams include your primary Cardiologist (physician) and Advanced Practice Providers (APPs -  Physician Assistants and Nurse Practitioners) who all work together to provide you with the care you need, when you need it.  We recommend signing up for the patient portal called "MyChart".  Sign up information is provided on this After Visit Summary.  MyChart is used to connect with patients for Virtual Visits (Telemedicine).  Patients are able to view lab/test results, encounter notes, upcoming appointments, etc.  Non-urgent  messages can be sent to your provider as well.   To learn more about what you can do with MyChart, go to [email or web address].    Your next  appointment:   6 month(s)  The format for your next appointment:   In Person  Provider:   Carlyle Dolly, MD or Bernerd Pho, PA-C   Other Instructions Thank you for choosing [facility]!     Signed, Erma Heritage, PA-C  07/21/2021 4:28 PM    [place] [address] [place], [place] [address] Phone: [phone] Fax: [phone]

## 2021-07-21 ENCOUNTER — Other Ambulatory Visit: Payer: Self-pay

## 2021-07-21 ENCOUNTER — Ambulatory Visit (INDEPENDENT_AMBULATORY_CARE_PROVIDER_SITE_OTHER): Payer: Medicare Other | Admitting: Student

## 2021-07-21 ENCOUNTER — Encounter: Payer: Self-pay | Admitting: Student

## 2021-07-21 VITALS — BP 126/58 | HR 68 | Ht 63.0 in | Wt 167.0 lb

## 2021-07-21 DIAGNOSIS — Z8679 Personal history of other diseases of the circulatory system: Secondary | ICD-10-CM

## 2021-07-21 DIAGNOSIS — I10 Essential (primary) hypertension: Secondary | ICD-10-CM

## 2021-07-21 DIAGNOSIS — I4819 Other persistent atrial fibrillation: Secondary | ICD-10-CM | POA: Diagnosis not present

## 2021-07-21 DIAGNOSIS — I340 Nonrheumatic mitral (valve) insufficiency: Secondary | ICD-10-CM | POA: Diagnosis not present

## 2021-07-21 NOTE — Patient Instructions (Signed)
Medication Instructions:  Your physician recommends that you continue on your current medications as directed. Please refer to the Current Medication list given to you today.  *If you need a refill on your cardiac medications before your next appointment, please call your pharmacy*   Lab Work: NONE   If you have labs (blood work) drawn today and your tests are completely normal, you will receive your results only by: . MyChart Message (if you have MyChart) OR . A paper copy in the mail If you have any lab test that is abnormal or we need to change your treatment, we will call you to review the results.   Testing/Procedures: NONE    Follow-Up: At [facility], you and your health needs are our priority.  As part of our continuing mission to provide you with exceptional heart care, we have created designated Provider Care Teams.  These Care Teams include your primary Cardiologist (physician) and Advanced Practice Providers (APPs -  Physician Assistants and Nurse Practitioners) who all work together to provide you with the care you need, when you need it.  We recommend signing up for the patient portal called "MyChart".  Sign up information is provided on this After Visit Summary.  MyChart is used to connect with patients for Virtual Visits (Telemedicine).  Patients are able to view lab/test results, encounter notes, upcoming appointments, etc.  Non-urgent messages can be sent to your provider as well.   To learn more about what you can do with MyChart, go to [email or web address].    Your next appointment:   6 month(s)  The format for your next appointment:   In Person  Provider:   Jonathan [place], MD or Brittany Strader, PA-C   Other Instructions Thank you for choosing [facility]!    

## 2021-07-25 LAB — COLOGUARD: Cologuard: NEGATIVE

## 2021-08-28 ENCOUNTER — Other Ambulatory Visit: Payer: Self-pay | Admitting: Cardiology

## 2021-08-31 ENCOUNTER — Inpatient Hospital Stay (HOSPITAL_COMMUNITY): Payer: Medicare Other | Attending: Hematology

## 2021-08-31 DIAGNOSIS — I4891 Unspecified atrial fibrillation: Secondary | ICD-10-CM | POA: Insufficient documentation

## 2021-08-31 DIAGNOSIS — R5383 Other fatigue: Secondary | ICD-10-CM | POA: Insufficient documentation

## 2021-08-31 DIAGNOSIS — D473 Essential (hemorrhagic) thrombocythemia: Secondary | ICD-10-CM | POA: Insufficient documentation

## 2021-08-31 DIAGNOSIS — I110 Hypertensive heart disease with heart failure: Secondary | ICD-10-CM | POA: Insufficient documentation

## 2021-08-31 DIAGNOSIS — E538 Deficiency of other specified B group vitamins: Secondary | ICD-10-CM | POA: Insufficient documentation

## 2021-08-31 DIAGNOSIS — Z79899 Other long term (current) drug therapy: Secondary | ICD-10-CM | POA: Insufficient documentation

## 2021-08-31 DIAGNOSIS — E119 Type 2 diabetes mellitus without complications: Secondary | ICD-10-CM | POA: Insufficient documentation

## 2021-08-31 DIAGNOSIS — I5032 Chronic diastolic (congestive) heart failure: Secondary | ICD-10-CM | POA: Insufficient documentation

## 2021-08-31 DIAGNOSIS — D509 Iron deficiency anemia, unspecified: Secondary | ICD-10-CM | POA: Insufficient documentation

## 2021-08-31 DIAGNOSIS — Z7901 Long term (current) use of anticoagulants: Secondary | ICD-10-CM | POA: Insufficient documentation

## 2021-08-31 DIAGNOSIS — Z8542 Personal history of malignant neoplasm of other parts of uterus: Secondary | ICD-10-CM | POA: Insufficient documentation

## 2021-09-02 ENCOUNTER — Other Ambulatory Visit: Payer: Self-pay

## 2021-09-02 ENCOUNTER — Telehealth: Payer: Self-pay | Admitting: Family Medicine

## 2021-09-02 ENCOUNTER — Inpatient Hospital Stay (HOSPITAL_COMMUNITY): Payer: Medicare Other

## 2021-09-02 DIAGNOSIS — D473 Essential (hemorrhagic) thrombocythemia: Secondary | ICD-10-CM | POA: Diagnosis not present

## 2021-09-02 DIAGNOSIS — Z7901 Long term (current) use of anticoagulants: Secondary | ICD-10-CM | POA: Diagnosis not present

## 2021-09-02 DIAGNOSIS — I4819 Other persistent atrial fibrillation: Secondary | ICD-10-CM

## 2021-09-02 DIAGNOSIS — Z8542 Personal history of malignant neoplasm of other parts of uterus: Secondary | ICD-10-CM | POA: Diagnosis not present

## 2021-09-02 DIAGNOSIS — R5383 Other fatigue: Secondary | ICD-10-CM | POA: Diagnosis not present

## 2021-09-02 DIAGNOSIS — E538 Deficiency of other specified B group vitamins: Secondary | ICD-10-CM | POA: Diagnosis not present

## 2021-09-02 DIAGNOSIS — E119 Type 2 diabetes mellitus without complications: Secondary | ICD-10-CM | POA: Diagnosis not present

## 2021-09-02 DIAGNOSIS — Z79899 Other long term (current) drug therapy: Secondary | ICD-10-CM | POA: Diagnosis not present

## 2021-09-02 DIAGNOSIS — I4891 Unspecified atrial fibrillation: Secondary | ICD-10-CM | POA: Diagnosis not present

## 2021-09-02 DIAGNOSIS — D7589 Other specified diseases of blood and blood-forming organs: Secondary | ICD-10-CM

## 2021-09-02 DIAGNOSIS — D75839 Thrombocytosis, unspecified: Secondary | ICD-10-CM

## 2021-09-02 DIAGNOSIS — D509 Iron deficiency anemia, unspecified: Secondary | ICD-10-CM | POA: Diagnosis not present

## 2021-09-02 DIAGNOSIS — D539 Nutritional anemia, unspecified: Secondary | ICD-10-CM

## 2021-09-02 DIAGNOSIS — I5032 Chronic diastolic (congestive) heart failure: Secondary | ICD-10-CM | POA: Diagnosis not present

## 2021-09-02 DIAGNOSIS — D649 Anemia, unspecified: Secondary | ICD-10-CM

## 2021-09-02 DIAGNOSIS — I110 Hypertensive heart disease with heart failure: Secondary | ICD-10-CM | POA: Diagnosis not present

## 2021-09-02 LAB — CBC WITH DIFFERENTIAL/PLATELET
Abs Immature Granulocytes: 0.02 10*3/uL (ref 0.00–0.07)
Basophils Absolute: 0 10*3/uL (ref 0.0–0.1)
Basophils Relative: 1 %
Eosinophils Absolute: 0.1 10*3/uL (ref 0.0–0.5)
Eosinophils Relative: 1 %
HCT: 31.9 % — ABNORMAL LOW (ref 36.0–46.0)
Hemoglobin: 11.1 g/dL — ABNORMAL LOW (ref 12.0–15.0)
Immature Granulocytes: 1 %
Lymphocytes Relative: 35 %
Lymphs Abs: 1.5 10*3/uL (ref 0.7–4.0)
MCH: 42 pg — ABNORMAL HIGH (ref 26.0–34.0)
MCHC: 34.8 g/dL (ref 30.0–36.0)
MCV: 120.8 fL — ABNORMAL HIGH (ref 80.0–100.0)
Monocytes Absolute: 0.4 10*3/uL (ref 0.1–1.0)
Monocytes Relative: 9 %
Neutro Abs: 2.3 10*3/uL (ref 1.7–7.7)
Neutrophils Relative %: 53 %
Platelets: 247 10*3/uL (ref 150–400)
RBC: 2.64 MIL/uL — ABNORMAL LOW (ref 3.87–5.11)
RDW: 11.5 % (ref 11.5–15.5)
WBC: 4.3 10*3/uL (ref 4.0–10.5)
nRBC: 0 % (ref 0.0–0.2)

## 2021-09-02 LAB — IRON AND TIBC
Iron: 84 ug/dL (ref 28–170)
Saturation Ratios: 17 % (ref 10.4–31.8)
TIBC: 508 ug/dL — ABNORMAL HIGH (ref 250–450)
UIBC: 424 ug/dL

## 2021-09-02 LAB — COMPREHENSIVE METABOLIC PANEL
ALT: 16 U/L (ref 0–44)
AST: 17 U/L (ref 15–41)
Albumin: 4.2 g/dL (ref 3.5–5.0)
Alkaline Phosphatase: 45 U/L (ref 38–126)
Anion gap: 8 (ref 5–15)
BUN: 20 mg/dL (ref 8–23)
CO2: 25 mmol/L (ref 22–32)
Calcium: 9 mg/dL (ref 8.9–10.3)
Chloride: 104 mmol/L (ref 98–111)
Creatinine, Ser: 0.86 mg/dL (ref 0.44–1.00)
GFR, Estimated: >60 mL/min (ref >60)
Glucose, Bld: 125 mg/dL — ABNORMAL HIGH (ref 70–99)
Potassium: 3.9 mmol/L (ref 3.5–5.1)
Sodium: 137 mmol/L (ref 135–145)
Total Bilirubin: 0.6 mg/dL (ref 0.3–1.2)
Total Protein: 7 g/dL (ref 6.5–8.1)

## 2021-09-02 LAB — TSH: TSH: 15.652 u[IU]/mL — ABNORMAL HIGH (ref 0.350–4.500)

## 2021-09-02 LAB — VITAMIN B12: Vitamin B-12: 147 pg/mL — ABNORMAL LOW (ref 180–914)

## 2021-09-02 LAB — FOLATE: Folate: 10 ng/mL (ref >5.9)

## 2021-09-02 LAB — LACTATE DEHYDROGENASE: LDH: 166 U/L (ref 98–192)

## 2021-09-02 LAB — FERRITIN: Ferritin: 56 ng/mL (ref 11–307)

## 2021-09-02 NOTE — Telephone Encounter (Signed)
Left message for patient to call back and schedule Medicare Annual Wellness Visit (AWV) to be completed by video or phone.   Last AWV: 05/29/2018  Please schedule at anytime with [facility].  45 minute appointment  Any questions, please contact me at [phone]

## 2021-09-04 LAB — COPPER, SERUM: Copper: 86 ug/dL (ref 80–158)

## 2021-09-05 LAB — KAPPA/LAMBDA LIGHT CHAINS
Kappa free light chain: 13.4 mg/L (ref 3.3–19.4)
Kappa, lambda light chain ratio: 0.88 (ref 0.26–1.65)
Lambda free light chains: 15.3 mg/L (ref 5.7–26.3)

## 2021-09-06 ENCOUNTER — Encounter: Payer: Self-pay | Admitting: *Deleted

## 2021-09-07 LAB — PROTEIN ELECTROPHORESIS, SERUM
A/G Ratio: 1.5 (ref 0.7–1.7)
Albumin ELP: 4 g/dL (ref 2.9–4.4)
Alpha-1-Globulin: 0.2 g/dL (ref 0.0–0.4)
Alpha-2-Globulin: 0.6 g/dL (ref 0.4–1.0)
Beta Globulin: 1.2 g/dL (ref 0.7–1.3)
Gamma Globulin: 0.6 g/dL (ref 0.4–1.8)
Globulin, Total: 2.6 g/dL (ref 2.2–3.9)
Total Protein ELP: 6.6 g/dL (ref 6.0–8.5)

## 2021-09-07 LAB — METHYLMALONIC ACID, SERUM: Methylmalonic Acid, Quantitative: 241 nmol/L (ref 0–378)

## 2021-09-08 ENCOUNTER — Ambulatory Visit (HOSPITAL_COMMUNITY): Payer: Medicare Other | Admitting: Physician Assistant

## 2021-09-08 ENCOUNTER — Other Ambulatory Visit: Payer: Self-pay

## 2021-09-08 ENCOUNTER — Inpatient Hospital Stay (HOSPITAL_BASED_OUTPATIENT_CLINIC_OR_DEPARTMENT_OTHER): Payer: Medicare Other | Admitting: Physician Assistant

## 2021-09-08 VITALS — BP 146/69 | HR 63 | Temp 97.7°F | Resp 18 | Wt 170.2 lb

## 2021-09-08 DIAGNOSIS — D75839 Thrombocytosis, unspecified: Secondary | ICD-10-CM

## 2021-09-08 DIAGNOSIS — E538 Deficiency of other specified B group vitamins: Secondary | ICD-10-CM | POA: Diagnosis not present

## 2021-09-08 DIAGNOSIS — D473 Essential (hemorrhagic) thrombocythemia: Secondary | ICD-10-CM | POA: Diagnosis not present

## 2021-09-08 DIAGNOSIS — E611 Iron deficiency: Secondary | ICD-10-CM

## 2021-09-08 DIAGNOSIS — D649 Anemia, unspecified: Secondary | ICD-10-CM

## 2021-09-08 LAB — IMMUNOFIXATION ELECTROPHORESIS
IgA: 239 mg/dL (ref 64–422)
IgG (Immunoglobin G), Serum: 642 mg/dL (ref 586–1602)
IgM (Immunoglobulin M), Srm: 66 mg/dL (ref 26–217)
Total Protein ELP: 6.3 g/dL (ref 6.0–8.5)

## 2021-09-08 NOTE — Patient Instructions (Signed)
[place] at [facility] Discharge Instructions  You were seen today by Tarri Abernethy PA-C for your elevated platelets and mild anemia.  Your platelet levels look great!  We are going to decrease your hydroxyurea (Hydrea) to 500 mg (1 tablet) once per day.  We will recheck your levels again in 3 months.  You continue to have some mild anemia (low red blood cells).  This may be related to your Hydrea, or to some mild iron deficiency and B12 deficiency.  LABS: Return in 3 months for repeat labs  MEDICATIONS: - Decrease Hydrea to 500 mg (1 tablet) once daily - Start taking ferrous sulfate (iron supplement) 325 mg daily.  If you experience any significant upset stomach, try taking your iron tablets with food.  If you have any constipation, take over-the-counter stool softener such as Colace once or twice daily. - Start taking vitamin B12 (cyanocobalamin) 500 mcg (0.5 mg) daily.  FOLLOW-UP APPOINTMENT: Office visit in 3 months, with same-day labs   Thank you for choosing [place] at [facility] to provide your oncology and hematology care.  To afford each patient quality time with our provider, please arrive at least 15 minutes before your scheduled appointment time.   If you have a lab appointment with the [facility] please come in thru the Main Entrance and check in at the main information desk.  You need to re-schedule your appointment should you arrive 10 or more minutes late.  We strive to give you quality time with our providers, and arriving late affects you and other patients whose appointments are after yours.  Also, if you no show three or more times for appointments you may be dismissed from the clinic at the providers discretion.     Again, thank you for choosing [facility].  Our hope is that these requests will decrease the amount of time that you wait before being seen by our physicians.        _____________________________________________________________  Should you have questions after your visit to [facility], please contact our office at [phone] and follow the prompts.  Our office hours are 8:00 a.m. and 4:30 p.m. Monday - Friday.  Please note that voicemails left after 4:00 p.m. may not be returned until the following business day.  We are closed weekends and major holidays.  You do have access to a nurse 24-7, just call the main number to the clinic [phone] and do not press any options, hold on the line and a nurse will answer the phone.    For prescription refill requests, have your pharmacy contact our office and allow 72 hours.    Due to Covid, you will need to wear a mask upon entering the hospital. If you do not have a mask, a mask will be given to you at the Main Entrance upon arrival. For doctor visits, patients may have 1 support person age 66 or older with them. For treatment visits, patients can not have anyone with them due to social distancing guidelines and our immunocompromised population.

## 2021-09-08 NOTE — Progress Notes (Addendum)
[address] [place], [place] [address]   CLINIC:  Medical Oncology/Hematology  PCP:  Dettinger, Fransisca Kaufmann, MD [place] [address] [phone]   REASON FOR VISIT:  Follow-up for JAK2 positive essential thrombocytosis   PRIOR THERAPY: None  CURRENT THERAPY: Hydrea 500 mg BID every other day, alternating with 500 mg once daily   INTERVAL HISTORY:  Ms. Edds 75 y.o. female returns for routine follow-up of her essential thrombocytosis.  She was last seen by Tarri Abernethy PA-C on 06/06/2021.  At today's visit, she reports feeling well.  No recent hospitalizations, surgeries, or changes in baseline health status.  Her only complaint today is fatigue, with energy at about 50%.  She is tolerating Hydrea well.  She denies any GI symptoms such as nausea, vomiting, diarrhea.  She has not noted any mouth sores or skin ulcers.    She denies any erythromelalgia, aquagenic pruritus, Raynaud's phenomenon, or vasomotor symptoms.  No signs or symptoms of DVT or PE.  No B symptoms such as fever, chills, night sweats, unintentional weight loss.  She denies any pelvic pain or vaginal bleeding, in light of her history of previous endometrial cancer.  Regarding her anemia, she denies any chest pain, dyspnea on exertion, syncopal episodes.  She has not noticed any bleeding such as epistaxis, hematemesis, hematochezia, or melena.  She is not taking any iron or B12 supplementation.  She has 50% energy and 100% appetite. She endorses that she is maintaining a stable weight.    REVIEW OF SYSTEMS:  Review of Systems  Constitutional:  Positive for fatigue. Negative for appetite change, chills, diaphoresis, fever and unexpected weight change.  HENT:   Negative for lump/mass and nosebleeds.   Eyes:  Negative for eye problems.  Respiratory:  Negative for cough, hemoptysis and shortness of breath.   Cardiovascular:  Negative for chest pain, leg swelling and  palpitations.  Gastrointestinal:  Negative for abdominal pain, blood in stool, constipation, diarrhea, nausea and vomiting.  Genitourinary:  Negative for hematuria.   Skin: Negative.   Neurological:  Negative for dizziness, headaches and light-headedness.  Hematological:  Does not bruise/bleed easily.     PAST MEDICAL/SURGICAL HISTORY:  Past Medical History:  Diagnosis Date   (HFpEF) heart failure with preserved ejection fraction ([facility] [place])    a. EF previously 30-35% in 02/2021 in the setting of afib w/RVR --> normalized by repeat imaging in 05/2021.   Anxiety    Atrial flutter ([place])    a. s/p DCCV in 02/2021   Atrial septal defect    Surgical repair in 1994   Cardiomyopathy [facility])    Chest pain    Normal coronary angiography in 1994 and 2011   Dizziness    Endometrial cancer, grade I ([place]) 10/09/2019   Essential thrombocythemia ([facility]) 06/06/2016   GERD (gastroesophageal reflux disease)    History of kidney stones    Hyperlipidemia    Hypertension    LBBB (left bundle branch block) 2011   Mitral regurgitation    Palpitations    Persistent atrial fibrillation ([name])    PONV (postoperative nausea and vomiting)    Syncope    Tricuspid regurgitation    Type 2 diabetes mellitus with other specified complication ([place]) 71/03/2693   Ventricular tachycardia ([place])    Exercise induced   Past Surgical History:  Procedure Laterality Date   APPENDECTOMY     ASD [facility]   BREAST EXCISIONAL BIOPSY  Right and left   BREAST SURGERY     right and left breast-benign   CARDIAC CATHETERIZATION  12/24/2009   [name]    CARDIOVERSION N/A 03/04/2021   Procedure: CARDIOVERSION;  Surgeon: [name], MD;  Location: [facility];  Service: Endoscopy;  Laterality: N/A;   CATARACT EXTRACTION W/PHACO Left 03/11/2013   Procedure: CATARACT EXTRACTION PHACO AND INTRAOCULAR LENS PLACEMENT (IOC);  Surgeon: [name], MD;  Location: [facility] OR[facility];  Service: Ophthalmology;   Laterality: Left;  CDE:  12.20   HYSTEROSCOPY WITH D & C N/A 11/11/2019   Procedure: DILATATION AND CURETTAGE /HYSTEROSCOPY;  Surgeon: [name], MD;  Location: [facility] ORS;  Service: Gynecology;  Laterality: N/A;   POLYPECTOMY N/A 11/11/2019   Procedure: POLYPECTOMY(ENDOMETRIAL POLYP);  Surgeon: [name], MD;  Location: [facility] ORS;  Service: Gynecology;  Laterality: N/A;   ROBOTIC ASSISTED LAPAROSCOPIC HYSTERECTOMY AND SALPINGECTOMY Bilateral 12/09/2019   Procedure: XI ROBOTIC ASSISTED LAPAROSCOPIC HYSTERECTOMY BILATERAL SALPINGOOOPHORECTOMY;  Surgeon: [name], MD;  Location: [facility];  Service: Gynecology;  Laterality: Bilateral;   SENTINEL NODE BIOPSY N/A 12/09/2019   Procedure: SENTINEL LYMPH  NODE BIOPSY;  Surgeon: [name], MD;  Location: [facility];  Service: Gynecology;  Laterality: N/A;   TEE WITHOUT CARDIOVERSION N/A 03/04/2021   Procedure: TRANSESOPHAGEAL ECHOCARDIOGRAM (TEE);  Surgeon: [name], MD;  Location: [facility];  Service: Endoscopy;  Laterality: N/A;   TONSILLECTOMY     TUBAL LIGATION     Bilateral     SOCIAL HISTORY:  Social History   Socioeconomic History   Marital status: Widowed    Spouse name: widowed   Number of children: 3   Years of education: Not on file   Highest education level: Not on file  Occupational History   Occupation: [employer]  Tobacco Use   Smoking status: Never   Smokeless tobacco: Never  Vaping Use   Vaping Use: Never used  Substance and Sexual Activity   Alcohol use: No   Drug use: No   Sexual activity: Not Currently    Birth control/protection: Post-menopausal  Other Topics Concern   Not on file  Social History Narrative   Married and resides in [place] with husband and 3 children   Sedentary lifestyle   Social Determinants of Health   Financial Resource Strain: Not on file  Food Insecurity: Not on file  Transportation Needs: No Transportation Needs   Lack of Transportation (Medical): No   Lack of  Transportation (Non-Medical): No  Physical Activity: Inactive   Days of Exercise per Week: 0 days   Minutes of Exercise per Session: 0 min  Stress: Not on file  Social Connections: Not on file  Intimate Partner Violence: Not At Risk   Fear of Current or Ex-Partner: No   Emotionally Abused: No   Physically Abused: No   Sexually Abused: No    FAMILY HISTORY:  Family History  Problem Relation Age of Onset   Cancer Mother        female   Heart failure Father    Stroke Sister    Cancer Sister        breast   Coronary artery disease Neg Hx     CURRENT MEDICATIONS:  Outpatient Encounter Medications as of 09/08/2021  Medication Sig   amiodarone (PACERONE) 200 MG tablet Take 200 mg by mouth daily.   apixaban (ELIQUIS) 5 MG TABS tablet Take 1 tablet (5 mg total) by mouth 2 (two) times daily.  atorvastatin (LIPITOR) 20 MG tablet Take 1 tablet (20 mg total) by mouth at bedtime.   fenofibrate 160 MG tablet Take 1 tablet (160 mg total) by mouth daily.   hydroxyurea (HYDREA) 500 MG capsule TAKE 1 CAPSULE BY MOUTH TWICE A DAY   ibuprofen (ADVIL) 600 MG tablet Take 1 tablet (600 mg total) by mouth every 6 (six) hours as needed for moderate pain. For AFTER surgery, use sparingly   ketoconazole (NIZORAL) 2 % cream Apply 1 application topically 2 (two) times daily.   metoprolol succinate (TOPROL-XL) 100 MG 24 hr tablet TAKE 1 TABLET (100 MG TOTAL) 2 (TWO) TIMES DAILY. TAKE WITH OR IMMEDIATELY FOLLOWING A MEAL.   Multiple Vitamins-Minerals (MULTIVITAMIN WOMEN 50+) TABS Take by mouth daily.   naproxen sodium (ALEVE) 220 MG tablet Take 220 mg by mouth as needed.   nystatin (MYCOSTATIN) 100000 UNIT/ML suspension Take 5 mLs (500,000 Units total) by mouth 4 (four) times daily.   omeprazole (PRILOSEC) 20 MG capsule Take 1 capsule (20 mg total) by mouth daily.   sacubitril-valsartan (ENTRESTO) 24-26 MG Take 1 tablet by mouth 2 (two) times daily.   No facility-administered encounter medications on file  as of 09/08/2021.    ALLERGIES:  Allergies  Allergen Reactions   Codeine Nausea And Vomiting   Morphine Other (See Comments)    GI symptoms     PHYSICAL EXAM:  ECOG PERFORMANCE STATUS: 1 - Symptomatic but completely ambulatory  There were no vitals filed for this visit. There were no vitals filed for this visit. Physical Exam Constitutional:      Appearance: Normal appearance. She is obese.  HENT:     Head: Normocephalic and atraumatic.     Mouth/Throat:     Mouth: Mucous membranes are moist.  Eyes:     Extraocular Movements: Extraocular movements intact.     Pupils: Pupils are equal, round, and reactive to light.  Cardiovascular:     Rate and Rhythm: Normal rate and regular rhythm.     Pulses: Normal pulses.     Heart sounds: Murmur heard.  Systolic murmur is present with a grade of 3/6.  Pulmonary:     Effort: Pulmonary effort is normal.     Breath sounds: Normal breath sounds.  Abdominal:     General: Bowel sounds are normal.     Palpations: Abdomen is soft.     Tenderness: There is no abdominal tenderness.  Musculoskeletal:        General: No swelling.     Right lower leg: No edema.     Left lower leg: No edema.  Lymphadenopathy:     Cervical: No cervical adenopathy.  Skin:    General: Skin is warm and dry.  Neurological:     General: No focal deficit present.     Mental Status: She is alert and oriented to person, place, and time.  Psychiatric:        Mood and Affect: Mood normal.        Behavior: Behavior normal.     LABORATORY DATA:  I have reviewed the labs as listed.  CBC    Component Value Date/Time   WBC 4.3 09/02/2021 1135   RBC 2.64 (L) 09/02/2021 1135   HGB 11.1 (L) 09/02/2021 1135   HGB 12.6 01/16/2020 1536   HCT 31.9 (L) 09/02/2021 1135   HCT 33.5 (L) 01/16/2020 1536   PLT 247 09/02/2021 1135   PLT 341 01/16/2020 1536   MCV 120.8 (H) 09/02/2021 1135   MCV  104 (H) 01/16/2020 1536   MCH 42.0 (H) 09/02/2021 1135   MCHC 34.8  09/02/2021 1135   RDW 11.5 09/02/2021 1135   RDW 13.0 01/16/2020 1536   LYMPHSABS 1.5 09/02/2021 1135   LYMPHSABS 2.6 01/16/2020 1536   MONOABS 0.4 09/02/2021 1135   EOSABS 0.1 09/02/2021 1135   EOSABS 0.3 01/16/2020 1536   BASOSABS 0.0 09/02/2021 1135   BASOSABS 0.1 01/16/2020 1536   CMP Latest Ref Rng & Units 09/02/2021 06/06/2021 03/22/2021  Glucose 70 - 99 mg/dL 125(H) 122(H) 147(H)  BUN 8 - 23 mg/dL _0 Creatinine 0.44 - 1.00 mg/dL 0.86 0.88 1.00  Sodium 135 - 145 mmol/L 137 139 140  Potassium 3.5 - 5.1 mmol/L 3.9 4.9 4.4  Chloride 98 - 111 mmol/L 104 105 106  CO2 22 - 32 mmol/L _1 Calcium 8.9 - 10.3 mg/dL 9.0 9.3 9.4  Total Protein 6.5 - 8.1 g/dL 7.0 7.1 -  Total Bilirubin 0.3 - 1.2 mg/dL 0.6 0.8 -  Alkaline Phos 38 - 126 U/L 45 55 -  AST 15 - 41 U/L 17 17 -  ALT 0 - 44 U/L 16 14 -    DIAGNOSTIC IMAGING:  I have independently reviewed the relevant imaging and discussed with the patient.  ASSESSMENT & PLAN: 1.  JAK2 positive essential thrombocytosis - High risk because of her advanced age. - Hydroxyurea 500 mg twice daily every other day, alternating with 500 mg daily, currently tolerating well - No prior history of thrombosis.  . - She does not report any aquagenic pruritus or vasomotor symptoms. -No lymphadenopathy or splenomegaly on exam - Most recent labs (09/02/2021): Platelets 247, Hgb 11.1, MCV 120.8 - PLAN:  Decrease Hydrea to 500 mg daily, since her platelets seem to be well controlled and with mild anemia (Hgb 11.1).  If her platelets increase on the lower dose of Hydrea, we will go back up on her dose as long as Hgb remains > 10.0 - RTC 3 months for repeat labs and follow-up.   2.  Anemia with iron deficiency and B12 deficiency - CBC (09/02/2021): Mildly decreased Hgb 11.1, MCV 120.8 (macrocytosis secondary to Hydrea) - May have some element of anemia secondary to Hydrea - No bright red blood per rectum or melena. - Mild iron deficiency noted with  ferritin 56, iron saturation 17%, and TIBC 508. - B12 deficiency with low B12 at 147, although methylmalonic acid is normal - Additional labs for work-up of anemia were unremarkable (normal creatinine 0.86, normal folate, copper; SPEP and free light chains unremarkable) - PLAN:  Decrease Hydrea to 500 mg daily. - Recommend oral iron supplementation with ferrous sulfate daily  - Start B12 supplement 500 mcg daily - Labs and RTC in 3 months   3.  Stage Ia grade 1 endometrioid adenocarcinoma - Status post robotic staging on 12/09/2019. - MMR normal. - Due to low risk of recurrence, no adjuvant therapy was recommended. - No pelvic pain, B symptoms, or abnormal vaginal bleeding at this time - PLAN: Continue follow-up with Dr. Denman George every 6 months for 5 years.    PLAN SUMMARY & DISPOSITION: Start taking iron and B12 Decrease Hydrea to 1 tablet daily Same-day labs and office visit in 3 months  All questions were answered. The patient knows to call the clinic with any problems, questions or concerns.  Medical decision making: Moderate  Time spent on visit: I spent 20 minutes counseling the patient face to face. The total time  spent in the appointment was 30 minutes and more than 50% was on counseling.   Harriett Rush, PA-C  09/08/2021 2:00 PM

## 2021-09-08 NOTE — Progress Notes (Signed)
Patient is taking Hydrea as prescribed.  She has not missed any doses and reports no side effects at this time.

## 2021-09-12 ENCOUNTER — Ambulatory Visit (INDEPENDENT_AMBULATORY_CARE_PROVIDER_SITE_OTHER): Payer: Medicare Other | Admitting: Family Medicine

## 2021-09-12 ENCOUNTER — Other Ambulatory Visit: Payer: Self-pay

## 2021-09-12 ENCOUNTER — Encounter: Payer: Self-pay | Admitting: Family Medicine

## 2021-09-12 VITALS — BP 110/59 | HR 65 | Temp 97.3°F | Ht 63.0 in | Wt 167.0 lb

## 2021-09-12 DIAGNOSIS — M7918 Myalgia, other site: Secondary | ICD-10-CM

## 2021-09-12 MED ORDER — PREDNISONE 20 MG PO TABS: 40.0000 mg | ORAL_TABLET | Freq: Every day | ORAL | 0 refills | Status: AC

## 2021-09-12 NOTE — Progress Notes (Signed)
Acute Office Visit  Subjective:    [name], female    DOB: Oct 07, 1946, 75 y.o.   MRN: [id number]  Chief Complaint  Patient presents with   Hip Pain    HPI Patient is in today for right hip pain x 1 week. The pain is her right gluteal. The pain is a 6/10. It is worse with movement and improves with rest. It is sharp. She has tried Chief Operating Officer and a tens unit with some improvement. She did take some aleve a few times. She denies fever, urinary symptoms, injury, changes in bowel or bladder control.   Past Medical History:  Diagnosis Date   (HFpEF) heart failure with preserved ejection fraction ([place])    a. EF previously 30-35% in 02/2021 in the setting of afib w/RVR --> normalized by repeat imaging in 05/2021.   Anxiety    Atrial flutter ([place])    a. s/p DCCV in 02/2021   Atrial septal defect    Surgical repair in 1994   Cardiomyopathy [facility])    Chest pain    Normal coronary angiography in 1994 and 2011   Dizziness    Endometrial cancer, grade I ([place]) 10/09/2019   Essential thrombocythemia ([place]) 06/06/2016   GERD (gastroesophageal reflux disease)    History of kidney stones    Hyperlipidemia    Hypertension    LBBB (left bundle branch block) 2011   Mitral regurgitation    Palpitations    Persistent atrial fibrillation ([name])    PONV (postoperative nausea and vomiting)    Syncope    Tricuspid regurgitation    Type 2 diabetes mellitus with other specified complication ([place]) 62/22/9798   Ventricular tachycardia ([place])    Exercise induced    Past Surgical History:  Procedure Laterality Date   APPENDECTOMY     ASD [facility]   BREAST EXCISIONAL BIOPSY     Right and left   BREAST SURGERY     right and left breast-benign   CARDIAC CATHETERIZATION  12/24/2009   [name]    CARDIOVERSION N/A 03/04/2021   Procedure: CARDIOVERSION;  Surgeon: [name], MD;  Location: [facility];  Service: Endoscopy;  Laterality: N/A;   CATARACT  EXTRACTION W/PHACO Left 03/11/2013   Procedure: CATARACT EXTRACTION PHACO AND INTRAOCULAR LENS PLACEMENT (IOC);  Surgeon: [name], MD;  Location: [facility];  Service: Ophthalmology;  Laterality: Left;  CDE:  12.20   HYSTEROSCOPY WITH D & C N/A 11/11/2019   Procedure: DILATATION AND CURETTAGE /HYSTEROSCOPY;  Surgeon: [name], MD;  Location: [facility];  Service: Gynecology;  Laterality: N/A;   POLYPECTOMY N/A 11/11/2019   Procedure: POLYPECTOMY(ENDOMETRIAL POLYP);  Surgeon: [name], MD;  Location: [facility];  Service: Gynecology;  Laterality: N/A;   ROBOTIC ASSISTED LAPAROSCOPIC HYSTERECTOMY AND SALPINGECTOMY Bilateral 12/09/2019   Procedure: XI ROBOTIC ASSISTED LAPAROSCOPIC HYSTERECTOMY BILATERAL SALPINGOOOPHORECTOMY;  Surgeon: [name], MD;  [facility];  Service: Gynecology;  Laterality: Bilateral;   SENTINEL NODE BIOPSY N/A 12/09/2019   Procedure: SENTINEL LYMPH  NODE BIOPSY;  Surgeon: [name], MD;  [facility];  Service: Gynecology;  Laterality: N/A;   TEE WITHOUT CARDIOVERSION N/A 03/04/2021   Procedure: TRANSESOPHAGEAL ECHOCARDIOGRAM (TEE);  Surgeon: [name], MD;  Location: [facility];  Service: Endoscopy;  Laterality: N/A;   TONSILLECTOMY     TUBAL LIGATION     Bilateral    Family History  Problem Relation Age of Onset  Cancer Mother        female   Heart failure Father    Stroke Sister    Cancer Sister        breast   Coronary artery disease Neg Hx     Social History   Socioeconomic History   Marital status: Widowed    Spouse name: widowed   Number of children: 3   Years of education: Not on file   Highest education level: Not on file  Occupational History   Occupation: [employer]  Tobacco Use   Smoking status: Never   Smokeless tobacco: Never  Vaping Use   Vaping Use: Never used  Substance and Sexual Activity   Alcohol use: No   Drug use: No   Sexual activity: Not Currently    Birth control/protection: Post-menopausal   Other Topics Concern   Not on file  Social History Narrative   Married and resides in [place] with husband and 3 children   Sedentary lifestyle   Social Determinants of Health   Financial Resource Strain: Not on file  Food Insecurity: Not on file  Transportation Needs: No Transportation Needs   Lack of Transportation (Medical): No   Lack of Transportation (Non-Medical): No  Physical Activity: Inactive   Days of Exercise per Week: 0 days   Minutes of Exercise per Session: 0 min  Stress: Not on file  Social Connections: Not on file  Intimate Partner Violence: Not At Risk   Fear of Current or Ex-Partner: No   Emotionally Abused: No   Physically Abused: No   Sexually Abused: No    Outpatient Medications Prior to Visit  Medication Sig Dispense Refill   amiodarone (PACERONE) 200 MG tablet Take 200 mg by mouth daily.     apixaban (ELIQUIS) 5 MG TABS tablet Take 1 tablet (5 mg total) by mouth 2 (two) times daily. 60 tablet 3   atorvastatin (LIPITOR) 20 MG tablet Take 1 tablet (20 mg total) by mouth at bedtime. 90 tablet 3   fenofibrate 160 MG tablet Take 1 tablet (160 mg total) by mouth daily. 90 tablet 1   hydroxyurea (HYDREA) 500 MG capsule TAKE 1 CAPSULE BY MOUTH TWICE A DAY 180 capsule 1   ketoconazole (NIZORAL) 2 % cream Apply 1 application topically 2 (two) times daily. 60 g 0   metoprolol succinate (TOPROL-XL) 100 MG 24 hr tablet TAKE 1 TABLET (100 MG TOTAL) 2 (TWO) TIMES DAILY. TAKE WITH OR IMMEDIATELY FOLLOWING A MEAL. 180 tablet 2   Multiple Vitamins-Minerals (MULTIVITAMIN WOMEN 50+) TABS Take by mouth daily.     naproxen sodium (ALEVE) 220 MG tablet Take 220 mg by mouth as needed.     nystatin (MYCOSTATIN) 100000 UNIT/ML suspension Take 5 mLs (500,000 Units total) by mouth 4 (four) times daily. 60 mL 2   omeprazole (PRILOSEC) 20 MG capsule Take 1 capsule (20 mg total) by mouth daily. 90 capsule 3   sacubitril-valsartan (ENTRESTO) 24-26 MG Take 1 tablet by mouth 2 (two)  times daily. 180 tablet 3   ibuprofen (ADVIL) 600 MG tablet Take 1 tablet (600 mg total) by mouth every 6 (six) hours as needed for moderate pain. For AFTER surgery, use sparingly 30 tablet 0   No facility-administered medications prior to visit.    Allergies  Allergen Reactions   Codeine Nausea And Vomiting   Morphine Other (See Comments)    GI symptoms    Review of Systems As per HPI.    Objective:  Physical Exam Vitals and nursing note reviewed.  Constitutional:      General: She is not in acute distress.    Appearance: She is not ill-appearing, toxic-appearing or diaphoretic.  Pulmonary:     Effort: Pulmonary effort is normal. No respiratory distress.  Musculoskeletal:     Lumbar back: No swelling, tenderness or bony tenderness. Negative right straight leg raise test.     Right hip: No deformity, tenderness or bony tenderness.       Legs:     Comments: Tenderness to palpation to area above.   Neurological:     Mental Status: She is alert and oriented to person, place, and time.     Gait: Gait normal.  Psychiatric:        Mood and Affect: Mood normal.        Behavior: Behavior normal.    There were no vitals taken for this visit. Wt Readings from Last 3 Encounters:  09/08/21 170 lb 3.1 oz (77.2 kg)  07/21/21 167 lb (75.8 kg)  06/29/21 163 lb 14.4 oz (74.3 kg)    Health Maintenance Due  Topic Date Due   OPHTHALMOLOGY EXAM  Never done   Zoster Vaccines- Shingrix (1 of 2) Never done   COLONOSCOPY (Pts 45-23yrs Insurance coverage will need to be confirmed)  Never done   COVID-19 Vaccine (4 - Booster for Moderna series) 11/06/2020    There are no preventive care reminders to display for this patient.   Lab Results  Component Value Date   TSH 15.652 (H) 09/02/2021   Lab Results  Component Value Date   WBC 4.3 09/02/2021   HGB 11.1 (L) 09/02/2021   HCT 31.9 (L) 09/02/2021   MCV 120.8 (H) 09/02/2021   PLT 247 09/02/2021   Lab Results  Component Value  Date   NA 137 09/02/2021   K 3.9 09/02/2021   CO2 25 09/02/2021   GLUCOSE 125 (H) 09/02/2021   BUN 20 09/02/2021   CREATININE 0.86 09/02/2021   BILITOT 0.6 09/02/2021   ALKPHOS 45 09/02/2021   AST 17 09/02/2021   ALT 16 09/02/2021   PROT 7.0 09/02/2021   ALBUMIN 4.2 09/02/2021   CALCIUM 9.0 09/02/2021   ANIONGAP 8 09/02/2021   Lab Results  Component Value Date   CHOL 200 (H) 06/16/2020   Lab Results  Component Value Date   HDL 33 (L) 06/16/2020   Lab Results  Component Value Date   LDLCALC 68 06/16/2020   Lab Results  Component Value Date   TRIG 646 (HH) 06/16/2020   Lab Results  Component Value Date   CHOLHDL 6.1 (H) 06/16/2020   Lab Results  Component Value Date   HGBA1C 6.5 06/15/2021       Assessment & Plan:   Jackqulyn was seen today for hip pain.  Diagnoses and all orders for this visit:  Gluteal pain Right side x 1 week. Discussed muscle strain. Prednisone burst as below. Tylenol, rest, heat, ice, stretching. Return to office for new or worsening symptoms, or if symptoms persist.  -     predniSONE (DELTASONE) 20 MG tablet; Take 2 tablets (40 mg total) by mouth daily with breakfast for 5 days.  The patient indicates understanding of these issues and agrees with the plan.  Gwenlyn Perking, FNP

## 2021-09-12 NOTE — Patient Instructions (Signed)
Gluteal Strain A gluteal strain happens when the muscles in the buttocks (gluteal muscles) are overstretched or torn. A tear can be partial or complete. A gluteal strain can cause pain and stiffness in your buttocks, legs, and lower back. A strain might also be called "pulling a muscle." The severity of a gluteal strain is rated as Grade 1, 2, or 3. A Grade 3 strain has the most tearing and pain. What are the causes? This condition may be caused by: Stretching the muscles too far. Putting too much stress on the muscles before they are warmed up. Overusing the muscles. Repetitive muscle movements over long periods of time. Injury. What increases the risk? The following factors may make you more likely to develop this condition: Being out in cold weather. Being physically tired. Having poor strength and flexibility. Not warming up properly before physical activity. Exercising or playing sports with sudden bursts of activity. Having poor exercise techniques. What are the signs or symptoms? Symptoms of this condition include: Pain in the buttocks, especially when moving the legs. Pain may spread to the lower back or to the legs. Bruising and swelling of the buttocks. Tenderness, weakness, or stiffness in the buttocks. Muscle spasms. How is this diagnosed? This condition is diagnosed based on: A physical exam. Your medical history. How well you can do certain range of motion exercises. Imaging tests, such as MRI, ultrasound, or X-rays. Your strain may be rated based on how severe it is. The ratings are: Grade 1 strain (mild). Muscles are overstretched. You might have very small muscle tears. This type of strain generally heals in about one week. Grade 2 strain (moderate). Muscles are partially torn. This may take one to two months to heal. Grade 3 strain (severe). Muscles are completely torn. A severe strain can take more than three months to heal. Grade 3 gluteal strains are rare. How  is this treated? This condition may be treated with: Resting, icing, and raising (elevating) the injured area as much as possible. Over-the-counter pain medicines. If your gluteal strain is severe or very painful, your health care provider may prescribe pain medicines or physical therapy. Surgery for severe strains is rare. Follow these instructions at home: Managing pain, stiffness, and swelling  If directed, put ice on the injured area: Put ice in a plastic bag. Place a towel between your skin and the bag. Leave the ice on for 20 minutes, 2-3 times per day. Activity Do not drive or use heavy machinery while taking prescription pain medicine. Return to your normal activities as told by your health care provider. Ask your health care provider what activities are safe for you. Rest your gluteal muscles as much as possible, especially for the first 2-3 days. Begin exercising or stretching as told by your health care provider. General instructions Take over-the-counter and prescription medicines only as told by your health care provider. Follow your treatment plan as told by your health care provider. This may include: Physical therapy. Massage. Local electrical stimulation (transcutaneous electrical nerve stimulation, TENS). Keep all follow-up visits. This is important. How is this prevented? Warm up and stretch before physical activity. Stretch after physical activity. Learn and use correct techniques for exercising and playing sports. Avoid difficult physical activities if your muscles are tired or sore. Strengthen your gluteal muscles and the surrounding muscles. Develop your flexibility by stretching at least once a day. Contact a health care provider if: You have pain or swelling that gets worse or does not get better with  medicine. Summary A gluteal strain happens when the muscles in the buttocks (gluteal muscles) are overstretched or torn. A gluteal strain can cause pain and  stiffness in your buttocks, legs, and lower back. If your gluteal strain is severe or very painful, your health care provider may prescribe pain medicines or physical therapy. This information is not intended to replace advice given to you by your health care provider. Make sure you discuss any questions you have with your health care provider. Document Revised: 03/16/2021 Document Reviewed: 03/16/2021 Elsevier Patient Education  [place].

## 2021-09-16 ENCOUNTER — Telehealth: Payer: Self-pay | Admitting: Student

## 2021-09-16 DIAGNOSIS — Z79899 Other long term (current) drug therapy: Secondary | ICD-10-CM

## 2021-09-16 MED ORDER — AMIODARONE HCL 200 MG PO TABS: 100.0000 mg | ORAL_TABLET | Freq: Every day | ORAL | 3 refills | Status: DC

## 2021-09-16 NOTE — Telephone Encounter (Signed)
Follow Up:     Patient says she is returning a call from yesterday, concerning her results.

## 2021-09-16 NOTE — Telephone Encounter (Signed)
Heather Heritage, PA-C  Please let the patient know her thyroid function is abnormal and could be secondary to Amiodarone. I would recommend that she reduce Amiodarone to 100mg  daily (can cut current tablets in half). Recheck TSH, Free T3 and Free T4 in 6-8 weeks   Pt notified and voiced agreement to reduce amiodarone and follow up lab work in 6-8 wks at [place].

## 2021-09-21 ENCOUNTER — Encounter: Payer: Self-pay | Admitting: Family Medicine

## 2021-09-21 ENCOUNTER — Ambulatory Visit (INDEPENDENT_AMBULATORY_CARE_PROVIDER_SITE_OTHER): Payer: Medicare Other | Admitting: Family Medicine

## 2021-09-21 ENCOUNTER — Other Ambulatory Visit: Payer: Self-pay

## 2021-09-21 VITALS — BP 148/73 | HR 70 | Ht 63.0 in | Wt 172.0 lb

## 2021-09-21 DIAGNOSIS — I10 Essential (primary) hypertension: Secondary | ICD-10-CM | POA: Diagnosis not present

## 2021-09-21 DIAGNOSIS — E781 Pure hyperglyceridemia: Secondary | ICD-10-CM

## 2021-09-21 DIAGNOSIS — E782 Mixed hyperlipidemia: Secondary | ICD-10-CM

## 2021-09-21 DIAGNOSIS — Z23 Encounter for immunization: Secondary | ICD-10-CM

## 2021-09-21 DIAGNOSIS — R7989 Other specified abnormal findings of blood chemistry: Secondary | ICD-10-CM | POA: Diagnosis not present

## 2021-09-21 DIAGNOSIS — E1169 Type 2 diabetes mellitus with other specified complication: Secondary | ICD-10-CM | POA: Diagnosis not present

## 2021-09-21 LAB — BAYER DCA HB A1C WAIVED: HB A1C (BAYER DCA - WAIVED): 6 % — ABNORMAL HIGH (ref 4.8–5.6)

## 2021-09-21 NOTE — Progress Notes (Signed)
BP (!) 148/73   Pulse 70   Ht 5\' 3"  (1.6 m)   Wt 172 lb (78 kg)   SpO2 97%   BMI 30.47 kg/m    Subjective:   [name], female    DOB: 05/04/1946, 75 y.o.   MRN: [id number]  HPI: Heather Barber is a 75 y.o. female presenting on 09/21/2021 for Medical Management of Chronic Issues, Diabetes, and Hypertension   HPI Type 2 diabetes mellitus Patient comes in today for recheck of his diabetes. Patient has been currently taking diet controlled, A1c 6.0 today.. Patient is currently on an ACE inhibitor/ARB. Patient has not seen an ophthalmologist this year. Patient here frequently denies any issues with their feet. The symptom started onset as an adult hypertension and hld ARE RELATED TO DM   Hypertension Patient is currently on amiodarone and metoprolol and Entresto, and their blood pressure today is 148/73. Patient denies any lightheadedness or dizziness. Patient denies headaches, blurred vision, chest pains, shortness of breath, or weakness. Denies any side effects from medication and is content with current medication.   Hyperlipidemia patient also sees cardiology and is on amiodarone Patient is coming in for recheck of his hyperlipidemia. The patient is currently taking fenofibrate and atorvastatin. They deny any issues with myalgias or history of liver damage from it. They deny any focal numbness or weakness or chest pain.     Relevant past medical, surgical, family and social history reviewed and updated as indicated. Interim medical history since our last visit reviewed. Allergies and medications reviewed and updated.  Review of Systems  Constitutional:  Negative for chills and fever.  Eyes:  Negative for visual disturbance.  Respiratory:  Negative for chest tightness and shortness of breath.   Cardiovascular:  Negative for chest pain and leg swelling.  Musculoskeletal:  Negative for back pain and gait problem.  Skin:  Negative for rash.  Neurological:   Negative for dizziness, light-headedness and headaches.  Psychiatric/Behavioral:  Negative for agitation and behavioral problems.   All other systems reviewed and are negative.  Per HPI unless specifically indicated above   Allergies as of 09/21/2021       Reactions   Codeine Nausea And Vomiting   Morphine Other (See Comments)   GI symptoms        Medication List        Accurate as of September 21, 2021  3:59 PM. If you have any questions, ask your nurse or doctor.          amiodarone 200 MG tablet Commonly known as: Pacerone Take 0.5 tablets (100 mg total) by mouth daily.   apixaban 5 MG Tabs tablet Commonly known as: ELIQUIS Take 1 tablet (5 mg total) by mouth 2 (two) times daily.   atorvastatin 20 MG tablet Commonly known as: LIPITOR Take 1 tablet (20 mg total) by mouth at bedtime.   fenofibrate 160 MG tablet Take 1 tablet (160 mg total) by mouth daily.   hydroxyurea 500 MG capsule Commonly known as: HYDREA TAKE 1 CAPSULE BY MOUTH TWICE A DAY   ketoconazole 2 % cream Commonly known as: NIZORAL Apply 1 application topically 2 (two) times daily.   metoprolol succinate 100 MG 24 hr tablet Commonly known as: TOPROL-XL TAKE 1 TABLET (100 MG TOTAL) 2 (TWO) TIMES DAILY. TAKE WITH OR IMMEDIATELY FOLLOWING A MEAL.   Multivitamin Women 50+ Tabs Take by mouth daily.   naproxen sodium 220 MG tablet Commonly known as: ALEVE Take 220  mg by mouth as needed.   nystatin 100000 UNIT/ML suspension Commonly known as: MYCOSTATIN Take 5 mLs (500,000 Units total) by mouth 4 (four) times daily.   omeprazole 20 MG capsule Commonly known as: PRILOSEC Take 1 capsule (20 mg total) by mouth daily.   sacubitril-valsartan 24-26 MG Commonly known as: ENTRESTO Take 1 tablet by mouth 2 (two) times daily.         Objective:   BP (!) 148/73   Pulse 70   Ht 5\' 3"  (1.6 m)   Wt 172 lb (78 kg)   SpO2 97%   BMI 30.47 kg/m   Wt Readings from Last 3 Encounters:   09/21/21 172 lb (78 kg)  09/12/21 167 lb (75.8 kg)  09/08/21 170 lb 3.1 oz (77.2 kg)    Physical Exam Vitals and nursing note reviewed.  Constitutional:      General: She is not in acute distress.    Appearance: She is well-developed. She is not diaphoretic.  Eyes:     Conjunctiva/sclera: Conjunctivae normal.  Cardiovascular:     Rate and Rhythm: Normal rate and regular rhythm.     Heart sounds: Normal heart sounds. No murmur heard. Pulmonary:     Effort: Pulmonary effort is normal. No respiratory distress.     Breath sounds: Normal breath sounds. No wheezing.  Musculoskeletal:        General: No swelling or tenderness. Normal range of motion.  Skin:    General: Skin is warm and dry.     Findings: No rash.  Neurological:     Mental Status: She is alert and oriented to person, place, and time.     Coordination: Coordination normal.  Psychiatric:        Behavior: Behavior normal.      Assessment & Plan:   Problem List Items Addressed This Visit       Cardiovascular and Mediastinum   Hypertension   Relevant Orders   Lipid panel   Bayer DCA Hb A1c Waived     Endocrine   Type 2 diabetes mellitus with other specified complication ([place]) - Primary   Relevant Orders   Lipid panel   Bayer DCA Hb A1c Waived   Bayer DCA Hb A1c Waived     Other   Elevated TSH   Relevant Orders   Thyroid Panel With TSH   Hyperlipidemia   Relevant Orders   Lipid panel   Bayer DCA Hb A1c Waived   Lipid panel   Hypertriglyceridemia   Other Visit Diagnoses     Need for immunization against influenza       Relevant Orders   Flu Vaccine QUAD High Dose(Fluad) (Completed)       Continue current medicine, A1c looks good, blood pressure up slightly but her dogkilled by a larger dog earlier today, will monitor closely.  Has looked good for her other specialist and recent visits. Follow up plan: Return in about 3 months (around 12/22/2021), or if symptoms worsen or fail to improve, for  Hypertension and diabetes and cholesterol.  Counseling provided for all of the vaccine components Orders Placed This Encounter  Procedures   Flu Vaccine QUAD High Dose(Fluad)   Lipid panel   Bayer DCA Hb A1c Waived   Bayer DCA Hb A1c Waived   Lipid panel   Thyroid Panel With TSH    Heather Pina, MD [facility] 09/21/2021, 3:59 PM

## 2021-09-22 LAB — LIPID PANEL
Chol/HDL Ratio: 4.2 ratio (ref 0.0–4.4)
Cholesterol, Total: 194 mg/dL (ref 100–199)
HDL: 46 mg/dL (ref >39)
LDL Chol Calc (NIH): 96 mg/dL (ref 0–99)
Triglycerides: 311 mg/dL — ABNORMAL HIGH (ref 0–149)
VLDL Cholesterol Cal: 52 mg/dL — ABNORMAL HIGH (ref 5–40)

## 2021-09-26 ENCOUNTER — Other Ambulatory Visit: Payer: Self-pay

## 2021-09-26 MED ORDER — APIXABAN 5 MG PO TABS: 5.0000 mg | ORAL_TABLET | Freq: Two times a day (BID) | ORAL | 5 refills | Status: DC

## 2021-09-26 MED ORDER — FENOFIBRATE 160 MG PO TABS: 160.0000 mg | ORAL_TABLET | Freq: Every day | ORAL | 1 refills | Status: DC

## 2021-09-26 NOTE — Telephone Encounter (Signed)
Prescription refill request for Eliquis received. Indication: Atrial Fib Last office visit: 04/15/21  B Strader PA-C Scr: 0.86 on 09/02/21 Age: 75 Weight: 74.5kg  Based on above findings Eliquis 5mg  twice daily is the appropriate dose.  Refill approved.

## 2021-10-12 ENCOUNTER — Telehealth: Payer: Self-pay | Admitting: Family Medicine

## 2021-10-12 NOTE — Telephone Encounter (Signed)
Left message for patient to call back and schedule Medicare Annual Wellness Visit (AWV) to be completed by video or phone.    Please schedule at anytime with [place]  45 minute appointment  Any questions, please contact me at [phone]

## 2021-10-13 IMAGING — CT CT ANGIO CHEST
2 of 6 series · 18 of 46 positions shown · IV contrast (Omnipaque or Isovue)
Comparison: 02/28/2021

CLINICAL DATA: Chest pressure since last night, arrhythmia

EXAM:
CT ANGIOGRAPHY CHEST WITH CONTRAST
TECHNIQUE: Multidetector CT imaging of the chest was performed using the
standard protocol during bolus administration of intravenous
contrast. Multiplanar CT image reconstructions and MIPs were
obtained to evaluate the vascular anatomy.
CONTRAST:  100mL OMNIPAQUE IOHEXOL 350 MG/ML SOLN

[Series 5: pe axial thins · axial · 0.69mm/px · z∈[+994,+1263]mm · 15 of 368 slices shown]
[im 16/368  lung]
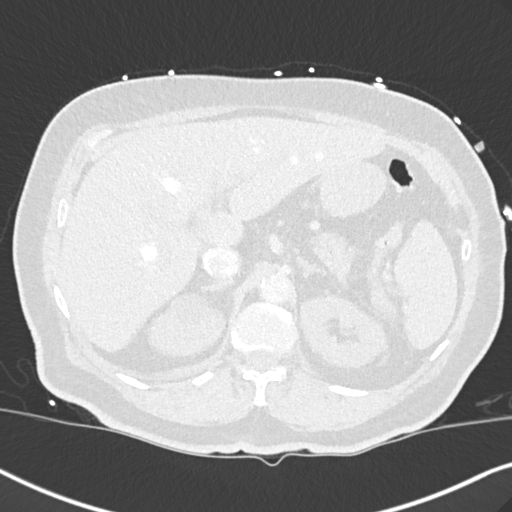
[im 46/368  soft-tissue]
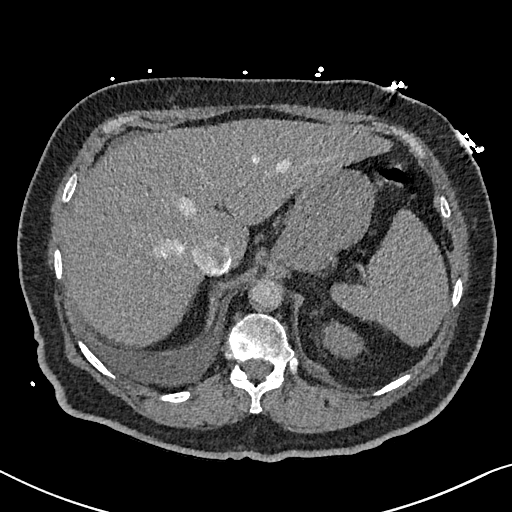
[im 62/368  lung]
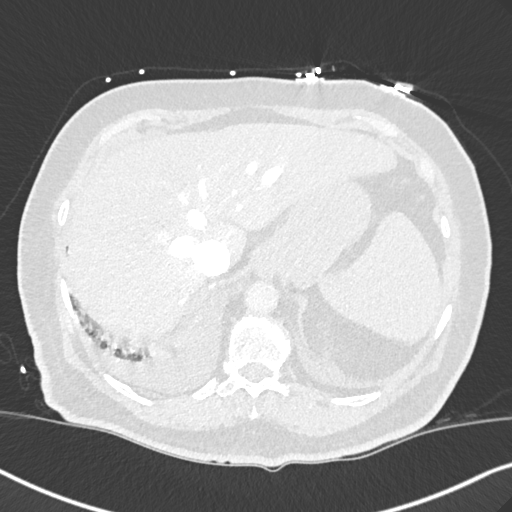
[im 92/368  soft-tissue]
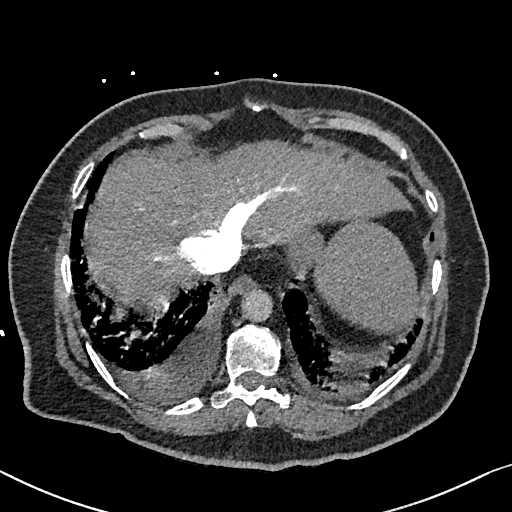
[im 108/368  lung]
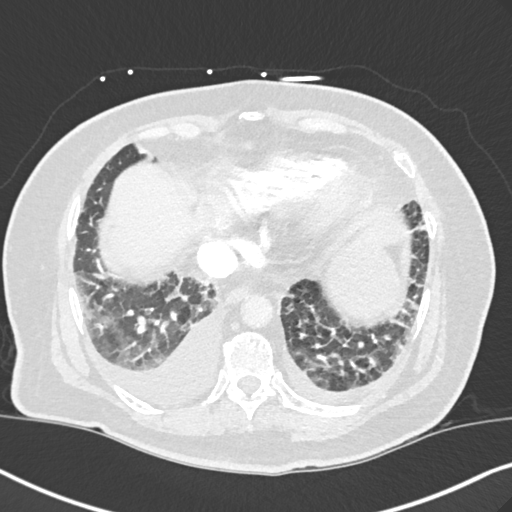
[im 138/368  soft-tissue]
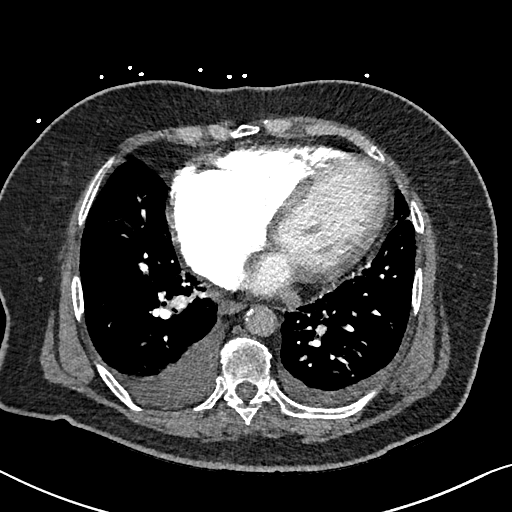
[im 153/368  lung]
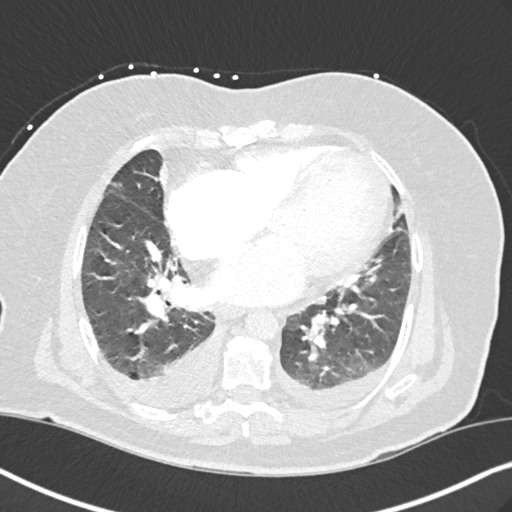
[im 184/368  soft-tissue]
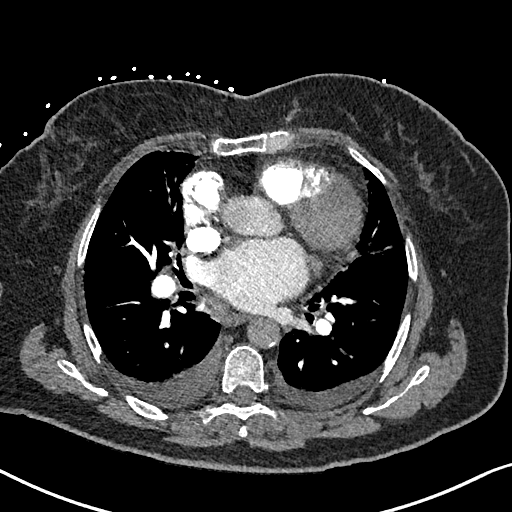
[im 215/368  lung]
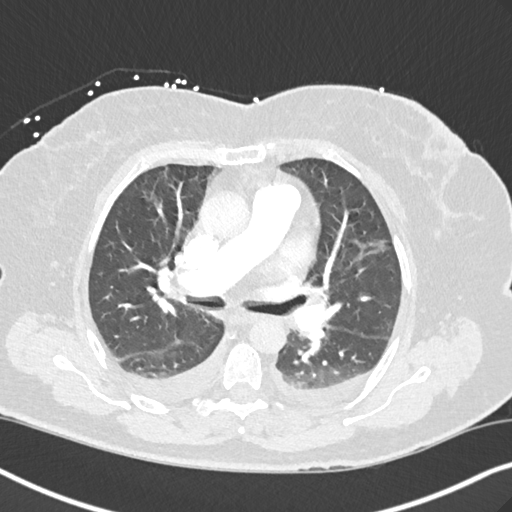
[im 230/368  soft-tissue]
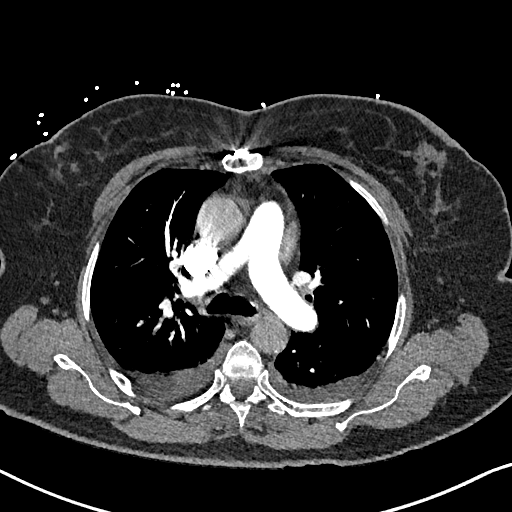
[im 260/368  lung]
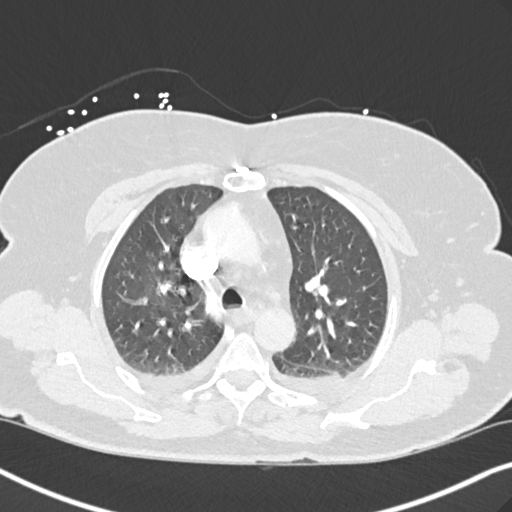
[im 276/368  soft-tissue]
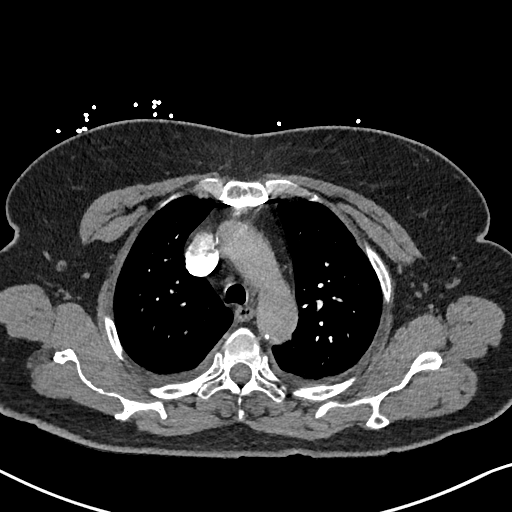
[im 306/368  lung]
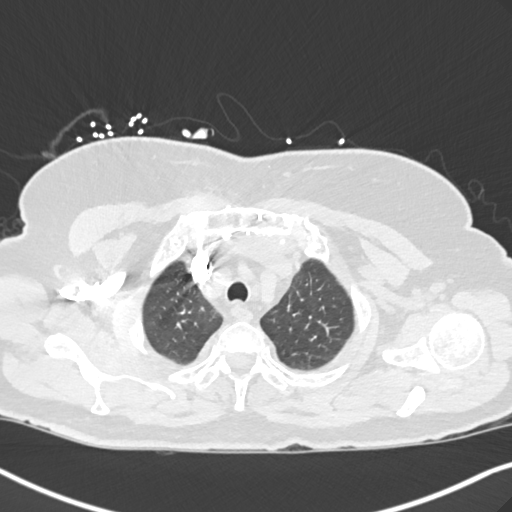
[im 322/368  soft-tissue]
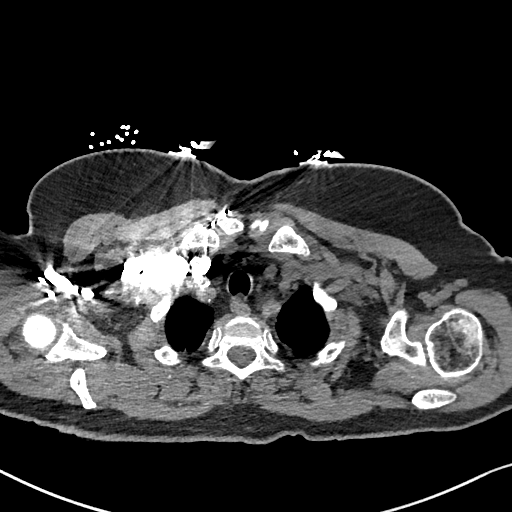
[im 352/368  lung]
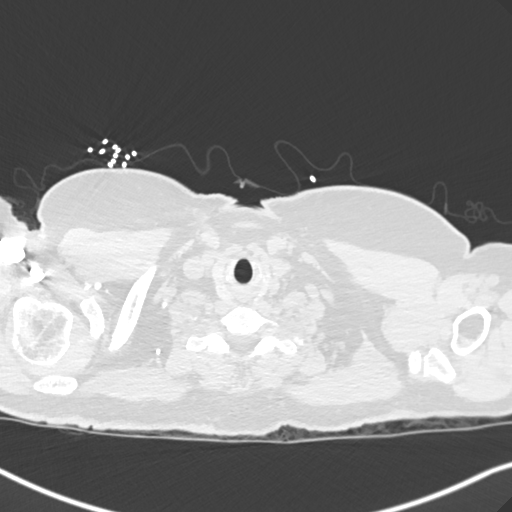

[Series 7: cor soft · coronal · 0.60mm/px · 3 of 151 slices shown]
[im 38/151  soft-tissue]
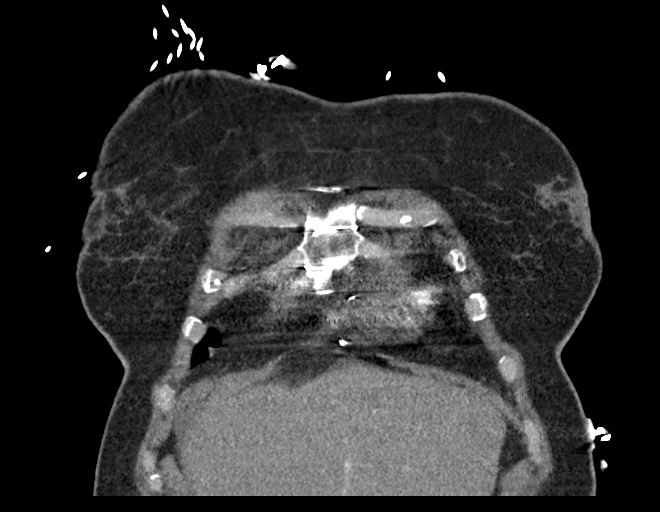
[im 76/151  soft-tissue]
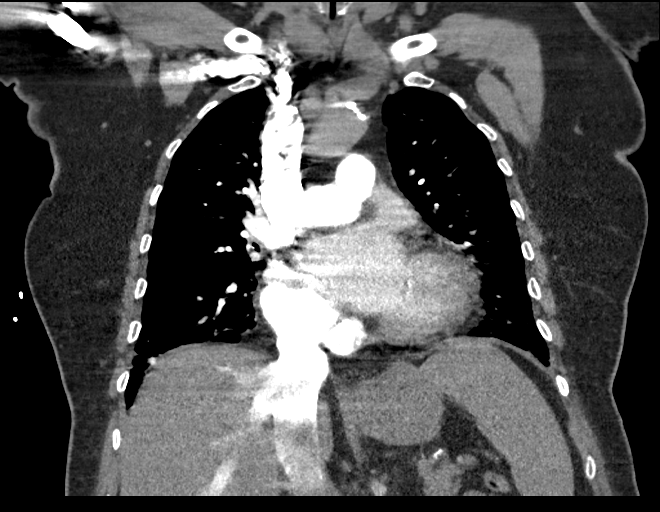
[im 113/151  soft-tissue]
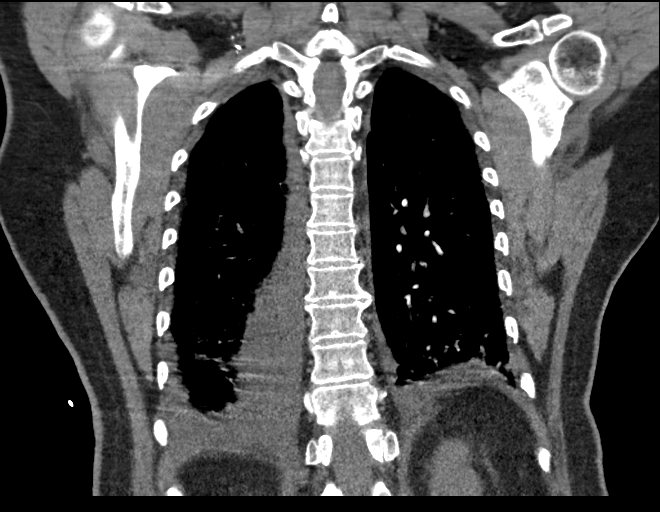

[18 of 46 positions shown; findings below may reference images not displayed]

FINDINGS: Cardiovascular: This is a technically adequate evaluation of the
pulmonary vasculature. No filling defects or pulmonary emboli.

Heart is enlarged without pericardial effusion. Reflux of contrast
into the hepatic veins suggests cardiac dysfunction. No evidence of
thoracic aortic aneurysm or dissection. Mild atherosclerosis.

Mediastinum/Nodes: No enlarged mediastinal, hilar, or axillary lymph
nodes. Thyroid gland, trachea, and esophagus demonstrate no
significant findings.

Lungs/Pleura: There are trace bilateral pleural effusions, right
greater than left. No airspace disease or pneumothorax. The central
airways are patent.

Upper Abdomen: No acute abnormality.

Musculoskeletal: No acute or destructive bony lesions. Reconstructed
images demonstrate no additional findings.

Review of the MIP images confirms the above findings.
IMPRESSION: 1. No evidence of pulmonary embolus.
2. Cardiomegaly, with reflux of contrast into the hepatic veins
suggesting cardiac dysfunction.
3. Trace bilateral pleural effusions, right greater than left.
4.  Aortic Atherosclerosis ([id number].I).

## 2021-10-17 ENCOUNTER — Ambulatory Visit (INDEPENDENT_AMBULATORY_CARE_PROVIDER_SITE_OTHER): Payer: Medicare Other | Admitting: Family Medicine

## 2021-10-17 ENCOUNTER — Encounter: Payer: Self-pay | Admitting: Family Medicine

## 2021-10-17 ENCOUNTER — Ambulatory Visit (INDEPENDENT_AMBULATORY_CARE_PROVIDER_SITE_OTHER): Payer: Medicare Other

## 2021-10-17 VITALS — BP 103/56 | HR 86 | Temp 97.6°F | Ht 63.0 in | Wt 170.5 lb

## 2021-10-17 DIAGNOSIS — M5431 Sciatica, right side: Secondary | ICD-10-CM | POA: Diagnosis not present

## 2021-10-17 DIAGNOSIS — M5432 Sciatica, left side: Secondary | ICD-10-CM | POA: Diagnosis not present

## 2021-10-17 MED ORDER — TRAMADOL HCL 50 MG PO TABS: 50.0000 mg | ORAL_TABLET | Freq: Three times a day (TID) | ORAL | 0 refills | Status: AC | PRN

## 2021-10-17 NOTE — Patient Instructions (Signed)
Sciatica Sciatica is pain, numbness, weakness, or tingling along the path of the sciatic nerve. The sciatic nerve starts in the lower back and runs down the back of each leg. The nerve controls the muscles in the lower leg and in the back of the knee. It also provides feeling (sensation) to the back of the thigh, the lower leg, and the sole of the foot. Sciatica is a symptom of another medical condition that pinches or puts pressure on the sciatic nerve. Sciatica most often only affects one side of the body. Sciatica usually goes away on its own or with treatment. In some cases, sciatica may come back (recur). What are the causes? This condition is caused by pressure on the sciatic nerve or pinching of the nerve. This may be the result of: A disk in between the bones of the spine bulging out too far (herniated disk). Age-related changes in the spinal disks. A pain disorder that affects a muscle in the buttock. Extra bone growth near the sciatic nerve. A break (fracture) of the pelvis. Pregnancy. Tumor. This is rare. What increases the risk? The following factors may make you more likely to develop this condition: Playing sports that place pressure or stress on the spine. Having poor strength and flexibility. A history of back injury or surgery. Sitting for long periods of time. Doing activities that involve repetitive bending or lifting. Obesity. What are the signs or symptoms? Symptoms can vary from mild to very severe, and they may include: Any of these problems in the lower back, leg, hip, or buttock: Mild tingling, numbness, or dull aches. Burning sensations. Sharp pains. Numbness in the back of the calf or the sole of the foot. Leg weakness. Severe back pain that makes movement difficult. Symptoms may get worse when you cough, sneeze, or laugh, or when you sit or stand for long periods of time. How is this diagnosed? This condition may be diagnosed based on: Your symptoms and  medical history. A physical exam. Blood tests. Imaging tests, such as: X-rays. MRI. CT scan. How is this treated? In many cases, this condition improves on its own without treatment. However, treatment may include: Reducing or modifying physical activity. Exercising and stretching. Icing and applying heat to the affected area. Medicines that help to: Relieve pain and swelling. Relax your muscles. Injections of medicines that help to relieve pain, irritation, and inflammation around the sciatic nerve (steroids). Surgery. Follow these instructions at home: Medicines Take over-the-counter and prescription medicines only as told by your health care provider. Ask your health care provider if the medicine prescribed to you: Requires you to avoid driving or using heavy machinery. Can cause constipation. You may need to take these actions to prevent or treat constipation: Drink enough fluid to keep your urine pale yellow. Take over-the-counter or prescription medicines. Eat foods that are high in fiber, such as beans, whole grains, and fresh fruits and vegetables. Limit foods that are high in fat and processed sugars, such as fried or sweet foods. Managing pain   If directed, put ice on the affected area. Put ice in a plastic bag. Place a towel between your skin and the bag. Leave the ice on for 20 minutes, 2-3 times a day. If directed, apply heat to the affected area. Use the heat source that your health care provider recommends, such as a moist heat pack or a heating pad. Place a towel between your skin and the heat source. Leave the heat on for 20-30 minutes. Remove   the heat if your skin turns bright red. This is especially important if you are unable to feel pain, heat, or cold. You may have a greater risk of getting burned. Activity  Return to your normal activities as told by your health care provider. Ask your health care provider what activities are safe for you. Avoid  activities that make your symptoms worse. Take brief periods of rest throughout the day. When you rest for longer periods, mix in some mild activity or stretching between periods of rest. This will help to prevent stiffness and pain. Avoid sitting for long periods of time without moving. Get up and move around at least one time each hour. Exercise and stretch regularly, as told by your health care provider. Do not lift anything that is heavier than 10 lb (4.5 kg) while you have symptoms of sciatica. When you do not have symptoms, you should still avoid heavy lifting, especially repetitive heavy lifting. When you lift objects, always use proper lifting technique, which includes: Bending your knees. Keeping the load close to your body. Avoiding twisting. General instructions Maintain a healthy weight. Excess weight puts extra stress on your back. Wear supportive, comfortable shoes. Avoid wearing high heels. Avoid sleeping on a mattress that is too soft or too hard. A mattress that is firm enough to support your back when you sleep may help to reduce your pain. Keep all follow-up visits as told by your health care provider. This is important. Contact a health care provider if: You have pain that: Wakes you up when you are sleeping. Gets worse when you lie down. Is worse than you have experienced in the past. Lasts longer than 4 weeks. You have an unexplained weight loss. Get help right away if: You are not able to control when you urinate or have bowel movements (incontinence). You have: Weakness in your lower back, pelvis, buttocks, or legs that gets worse. Redness or swelling of your back. A burning sensation when you urinate. Summary Sciatica is pain, numbness, weakness, or tingling along the path of the sciatic nerve. This condition is caused by pressure on the sciatic nerve or pinching of the nerve. Sciatica can cause pain, numbness, or tingling in the lower back, legs, hips, and  buttocks. Treatment often includes rest, exercise, medicines, and applying ice or heat. This information is not intended to replace advice given to you by your health care provider. Make sure you discuss any questions you have with your health care provider. Document Revised: 11/04/2018 Document Reviewed: 11/04/2018 Elsevier Patient Education  2022 [employer]  

## 2021-10-17 NOTE — Progress Notes (Signed)
Acute Office Visit  Subjective:    [name], female    DOB: [birth year], 75 y.o.   MRN: [id number]  Chief Complaint  Patient presents with   Back Pain    HPI Patient is in today for lower back pain x 1 month. She reports a sharp pain in her lower back that radiates down both legs. The pain occurs with movement. She does not have pain at rest. The pain is an 8/10. She has tried a prednisone burst, tylenol, naproxen, salon pas, and heating pad without improvement. She denies fever, numbness, tingling, or saddle anesthesia.   Past Medical History:  Diagnosis Date   (HFpEF) heart failure with preserved ejection fraction ([place])    a. EF previously 30-35% in 02/2021 in the setting of afib w/RVR --> normalized by repeat imaging in 05/2021.   Anxiety    Atrial flutter ([place])    a. s/p DCCV in 02/2021   Atrial septal defect    Surgical repair in 1994   Cardiomyopathy [facility])    Chest pain    Normal coronary angiography in 1994 and 2011   Dizziness    Endometrial cancer, grade I ([place]) 10/09/2019   Essential thrombocythemia ([place]) 06/06/2016   GERD (gastroesophageal reflux disease)    History of kidney stones    Hyperlipidemia    Hypertension    LBBB (left bundle branch block) 2011   Mitral regurgitation    Palpitations    Persistent atrial fibrillation ([name])    PONV (postoperative nausea and vomiting)    Syncope    Tricuspid regurgitation    Type 2 diabetes mellitus with other specified complication ([place]) 54/65/0354   Ventricular tachycardia    Exercise induced    Past Surgical History:  Procedure Laterality Date   APPENDECTOMY     [facility]   BREAST EXCISIONAL BIOPSY     Right and left   BREAST SURGERY     right and left breast-benign   CARDIAC CATHETERIZATION  12/24/2009   [name]    CARDIOVERSION N/A 03/04/2021   Procedure: CARDIOVERSION;  Surgeon: [name], MD;  Location: [facility];  Service: Endoscopy;  Laterality:  N/A;   CATARACT EXTRACTION W/PHACO Left 03/11/2013   Procedure: CATARACT EXTRACTION PHACO AND INTRAOCULAR LENS PLACEMENT (IOC);  Surgeon: [name], MD;  Location: [facility];  Service: Ophthalmology;  Laterality: Left;  CDE:  12.20   HYSTEROSCOPY WITH D & C N/A 11/11/2019   Procedure: DILATATION AND CURETTAGE /HYSTEROSCOPY;  Surgeon: [name], MD;  Location: [facility];  Service: Gynecology;  Laterality: N/A;   POLYPECTOMY N/A 11/11/2019   Procedure: POLYPECTOMY(ENDOMETRIAL POLYP);  Surgeon: [name], MD;  Location: [facility];  Service: Gynecology;  Laterality: N/A;   ROBOTIC ASSISTED LAPAROSCOPIC HYSTERECTOMY AND SALPINGECTOMY Bilateral 12/09/2019   Procedure: XI ROBOTIC ASSISTED LAPAROSCOPIC HYSTERECTOMY BILATERAL SALPINGOOOPHORECTOMY;  Surgeon: [name], MD;  Location: [facility];  Service: Gynecology;  Laterality: Bilateral;   SENTINEL NODE BIOPSY N/A 12/09/2019   Procedure: SENTINEL LYMPH  NODE BIOPSY;  Surgeon: [name], MD;  Location: [facility];  Service: Gynecology;  Laterality: N/A;   TEE WITHOUT CARDIOVERSION N/A 03/04/2021   Procedure: TRANSESOPHAGEAL ECHOCARDIOGRAM (TEE);  Surgeon: [name], MD;  Location: [facility];  Service: Endoscopy;  Laterality: N/A;   TONSILLECTOMY     TUBAL LIGATION     Bilateral    Family History  Problem Relation Age of Onset  Cancer Mother        female   Heart failure Father    Stroke Sister    Cancer Sister        breast   Coronary artery disease Neg Hx     Social History   Socioeconomic History   Marital status: Widowed    Spouse name: widowed   Number of children: 3   Years of education: Not on file   Highest education level: Not on file  Occupational History   Occupation: [employer]  Tobacco Use   Smoking status: Never   Smokeless tobacco: Never  Vaping Use   Vaping Use: Never used  Substance and Sexual Activity   Alcohol use: No   Drug use: No   Sexual activity: Not Currently    Birth control/protection:  Post-menopausal  Other Topics Concern   Not on file  Social History Narrative   Married and resides in [place] with husband and 3 children   Sedentary lifestyle   Social Determinants of Health   Financial Resource Strain: Not on file  Food Insecurity: Not on file  Transportation Needs: No Transportation Needs   Lack of Transportation (Medical): No   Lack of Transportation (Non-Medical): No  Physical Activity: Inactive   Days of Exercise per Week: 0 days   Minutes of Exercise per Session: 0 min  Stress: Not on file  Social Connections: Not on file  Intimate Partner Violence: Not At Risk   Fear of Current or Ex-Partner: No   Emotionally Abused: No   Physically Abused: No   Sexually Abused: No    Outpatient Medications Prior to Visit  Medication Sig Dispense Refill   amiodarone (PACERONE) 200 MG tablet Take 0.5 tablets (100 mg total) by mouth daily. 45 tablet 3   apixaban (ELIQUIS) 5 MG TABS tablet Take 1 tablet (5 mg total) by mouth 2 (two) times daily. 60 tablet 5   atorvastatin (LIPITOR) 20 MG tablet Take 1 tablet (20 mg total) by mouth at bedtime. 90 tablet 3   fenofibrate 160 MG tablet Take 1 tablet (160 mg total) by mouth daily. 90 tablet 1   hydroxyurea (HYDREA) 500 MG capsule TAKE 1 CAPSULE BY MOUTH TWICE A DAY 180 capsule 1   ketoconazole (NIZORAL) 2 % cream Apply 1 application topically 2 (two) times daily. 60 g 0   metoprolol succinate (TOPROL-XL) 100 MG 24 hr tablet TAKE 1 TABLET (100 MG TOTAL) 2 (TWO) TIMES DAILY. TAKE WITH OR IMMEDIATELY FOLLOWING A MEAL. 180 tablet 2   Multiple Vitamins-Minerals (MULTIVITAMIN WOMEN 50+) TABS Take by mouth daily.     naproxen sodium (ALEVE) 220 MG tablet Take 220 mg by mouth as needed.     omeprazole (PRILOSEC) 20 MG capsule Take 1 capsule (20 mg total) by mouth daily. 90 capsule 3   sacubitril-valsartan (ENTRESTO) 24-26 MG Take 1 tablet by mouth 2 (two) times daily. 180 tablet 3   nystatin (MYCOSTATIN) 100000 UNIT/ML suspension Take  5 mLs (500,000 Units total) by mouth 4 (four) times daily. 60 mL 2   No facility-administered medications prior to visit.    Allergies  Allergen Reactions   Codeine Nausea And Vomiting   Morphine Other (See Comments)    GI symptoms    Review of Systems As per HPI.     Objective:    Physical Exam Vitals and nursing note reviewed.  Constitutional:      General: She is not in acute distress.    Appearance: She is  not ill-appearing, toxic-appearing or diaphoretic.  Pulmonary:     Effort: Pulmonary effort is normal. No respiratory distress.  Musculoskeletal:     Lumbar back: Tenderness (paraspinal, gluteal) present. No swelling, edema, deformity, signs of trauma or bony tenderness. Positive right straight leg raise test and positive left straight leg raise test.     Right lower leg: No edema.     Left lower leg: No edema.  Skin:    General: Skin is warm and dry.  Neurological:     Mental Status: She is alert and oriented to person, place, and time.     Motor: No weakness.     Gait: Gait normal.  Psychiatric:        Mood and Affect: Mood normal.        Behavior: Behavior normal.    BP (!) 103/56    Pulse 86    Temp 97.6 F (36.4 C) (Temporal)    Ht 5\' 3"  (1.6 m)    Wt 170 lb 8 oz (77.3 kg)    BMI 30.20 kg/m  Wt Readings from Last 3 Encounters:  10/17/21 170 lb 8 oz (77.3 kg)  09/21/21 172 lb (78 kg)  09/12/21 167 lb (75.8 kg)    Health Maintenance Due  Topic Date Due   COVID-19 Vaccine (4 - Booster for Moderna series) 11/06/2020    There are no preventive care reminders to display for this patient.   Lab Results  Component Value Date   TSH 15.652 (H) 09/02/2021   Lab Results  Component Value Date   WBC 4.3 09/02/2021   HGB 11.1 (L) 09/02/2021   HCT 31.9 (L) 09/02/2021   MCV 120.8 (H) 09/02/2021   PLT 247 09/02/2021   Lab Results  Component Value Date   NA 137 09/02/2021   K 3.9 09/02/2021   CO2 25 09/02/2021   GLUCOSE 125 (H) 09/02/2021   BUN 20  09/02/2021   CREATININE 0.86 09/02/2021   BILITOT 0.6 09/02/2021   ALKPHOS 45 09/02/2021   AST 17 09/02/2021   ALT 16 09/02/2021   PROT 7.0 09/02/2021   ALBUMIN 4.2 09/02/2021   CALCIUM 9.0 09/02/2021   ANIONGAP 8 09/02/2021   Lab Results  Component Value Date   CHOL 194 09/21/2021   Lab Results  Component Value Date   HDL 46 09/21/2021   Lab Results  Component Value Date   LDLCALC 96 09/21/2021   Lab Results  Component Value Date   TRIG 311 (H) 09/21/2021   Lab Results  Component Value Date   CHOLHDL 4.2 09/21/2021   Lab Results  Component Value Date   HGBA1C 6.0 (H) 09/21/2021       Assessment & Plan:   Aerilynn was seen today for back pain.  Diagnoses and all orders for this visit:  Bilateral sciatica Xray today in office, radiology report pending, will notify patient of results. Discussed PT referral pending Xray results. Stretching, heat, muscle rubs. Short course of tramadol provided. PDMP reviewed, no red flags. Patient aware that tramadol will not be refilled.  -     DG Lumbar Spine 2-3 Views; Future -     traMADol (ULTRAM) 50 MG tablet; Take 1 tablet (50 mg total) by mouth every 8 (eight) hours as needed for up to 5 days.  Return to office for new or worsening symptoms, or if symptoms persist.   The patient indicates understanding of these issues and agrees with the plan.  Gwenlyn Perking, FNP

## 2021-10-20 ENCOUNTER — Telehealth: Payer: Self-pay | Admitting: Family Medicine

## 2021-11-14 ENCOUNTER — Ambulatory Visit (INDEPENDENT_AMBULATORY_CARE_PROVIDER_SITE_OTHER): Payer: Medicare Other

## 2021-11-14 VITALS — Ht 65.0 in | Wt 170.0 lb

## 2021-11-14 DIAGNOSIS — I4891 Unspecified atrial fibrillation: Secondary | ICD-10-CM

## 2021-11-14 DIAGNOSIS — E1169 Type 2 diabetes mellitus with other specified complication: Secondary | ICD-10-CM

## 2021-11-14 DIAGNOSIS — I10 Essential (primary) hypertension: Secondary | ICD-10-CM

## 2021-11-14 DIAGNOSIS — Z0000 Encounter for general adult medical examination without abnormal findings: Secondary | ICD-10-CM

## 2021-11-14 DIAGNOSIS — Z0001 Encounter for general adult medical examination with abnormal findings: Secondary | ICD-10-CM

## 2021-11-14 DIAGNOSIS — E782 Mixed hyperlipidemia: Secondary | ICD-10-CM | POA: Diagnosis not present

## 2021-11-14 NOTE — Progress Notes (Signed)
Subjective:   Heather Barber is a 76 y.o. female who presents for an Initial Medicare Annual Wellness Visit. Virtual Visit via Telephone Note  I connected with  Heather Barber on 11/14/21 at  1:15 PM EST by telephone and verified that I am speaking with the correct person using two identifiers.  Location: Patient: HOME Provider: [facility] Persons participating in the virtual visit: patient/Nurse Health Advisor   I discussed the limitations, risks, security and privacy concerns of performing an evaluation and management service by telephone and the availability of in person appointments. The patient expressed understanding and agreed to proceed.  Interactive audio and video telecommunications were attempted between this nurse and patient, however failed, due to patient having technical difficulties OR patient did not have access to video capability.  We continued and completed visit with audio only.  Some vital signs may be absent or patient reported.   Chriss Driver, LPN  Review of Systems     Cardiac Risk Factors include: advanced age (>98men, >2 women);diabetes mellitus;dyslipidemia;hypertension;obesity (BMI >30kg/m2)  PHONE VISIT. PT AT HOME. NURSE AT [facility].    Objective:    Today's Vitals   11/14/21 1317  Weight: 170 lb (77.1 kg)  Height: 5\' 5"  (1.651 m)   Body mass index is 28.29 kg/m.  Advanced Directives 11/14/2021 09/08/2021 06/06/2021 02/28/2021 12/06/2020 04/13/2020 12/09/2019  Does Patient Have a Medical Advance Directive? No Yes No No No No Yes  Type of Advance Directive - [place]  Does patient want to make changes to medical advance directive? No - Patient declined No - Patient declined - - - - No - Patient declined  Copy of Ruma in Chart? - No - copy requested - - - - -  Would patient like information on creating a medical advance directive? No - Patient declined - No - Patient declined  No - Patient declined No - Patient declined Yes (MAU/Ambulatory/Procedural Areas - Information given) -  Pre-existing out of facility DNR order (yellow form or pink MOST form) - - - - - - -    Current Medications (verified) Outpatient Encounter Medications as of 11/14/2021  Medication Sig   amiodarone (PACERONE) 200 MG tablet Take 0.5 tablets (100 mg total) by mouth daily.   apixaban (ELIQUIS) 5 MG TABS tablet Take 1 tablet (5 mg total) by mouth 2 (two) times daily.   atorvastatin (LIPITOR) 20 MG tablet Take 1 tablet (20 mg total) by mouth at bedtime.   hydroxyurea (HYDREA) 500 MG capsule TAKE 1 CAPSULE BY MOUTH TWICE A DAY   ketoconazole (NIZORAL) 2 % cream Apply 1 application topically 2 (two) times daily.   metoprolol succinate (TOPROL-XL) 100 MG 24 hr tablet TAKE 1 TABLET (100 MG TOTAL) 2 (TWO) TIMES DAILY. TAKE WITH OR IMMEDIATELY FOLLOWING A MEAL.   Multiple Vitamins-Minerals (MULTIVITAMIN WOMEN 50+) TABS Take by mouth daily.   naproxen sodium (ALEVE) 220 MG tablet Take 220 mg by mouth as needed.   omeprazole (PRILOSEC) 20 MG capsule Take 1 capsule (20 mg total) by mouth daily.   sacubitril-valsartan (ENTRESTO) 24-26 MG Take 1 tablet by mouth 2 (two) times daily.   fenofibrate 160 MG tablet Take 1 tablet (160 mg total) by mouth daily.   No facility-administered encounter medications on file as of 11/14/2021.    Allergies (verified) Codeine and Morphine   History: Past Medical History:  Diagnosis Date   (HFpEF) heart failure  with preserved ejection fraction ([place])    a. EF previously 30-35% in 02/2021 in the setting of afib w/RVR --> normalized by repeat imaging in 05/2021.   Anxiety    Atrial flutter ([place])    a. s/p DCCV in 02/2021   Atrial septal defect    Surgical repair in 1994   Cardiomyopathy [facility])    Chest pain    Normal coronary angiography in 1994 and 2011   Dizziness    Endometrial cancer, grade I ([place]) 10/09/2019   Essential thrombocythemia ([place]) 06/06/2016    GERD (gastroesophageal reflux disease)    History of kidney stones    Hyperlipidemia    Hypertension    LBBB (left bundle branch block) 2011   Mitral regurgitation    Palpitations    Persistent atrial fibrillation ([name])    PONV (postoperative nausea and vomiting)    Syncope    Tricuspid regurgitation    Type 2 diabetes mellitus with other specified complication ([place]) 36/64/4034   Ventricular tachycardia    Exercise induced   Past Surgical History:  Procedure Laterality Date   APPENDECTOMY     ASD [facility]   BREAST EXCISIONAL BIOPSY     Right and left   BREAST SURGERY     right and left breast-benign   CARDIAC CATHETERIZATION  12/24/2009   [name]    CARDIOVERSION N/A 03/04/2021   Procedure: CARDIOVERSION;  Surgeon: [name], MD;  Location: [facility];  Service: Endoscopy;  Laterality: N/A;   CATARACT EXTRACTION W/PHACO Left 03/11/2013   Procedure: CATARACT EXTRACTION PHACO AND INTRAOCULAR LENS PLACEMENT (IOC);  Surgeon: [name], MD;  Location: [facility];  Service: Ophthalmology;  Laterality: Left;  CDE:  12.20   HYSTEROSCOPY WITH D & C N/A 11/11/2019   Procedure: DILATATION AND CURETTAGE /HYSTEROSCOPY;  Surgeon: [name], MD;  Location: [facility];  Service: Gynecology;  Laterality: N/A;   POLYPECTOMY N/A 11/11/2019   Procedure: POLYPECTOMY(ENDOMETRIAL POLYP);  Surgeon: [name], MD;  Location: [facility];  Service: Gynecology;  Laterality: N/A;   ROBOTIC ASSISTED LAPAROSCOPIC HYSTERECTOMY AND SALPINGECTOMY Bilateral 12/09/2019   Procedure: XI ROBOTIC ASSISTED LAPAROSCOPIC HYSTERECTOMY BILATERAL SALPINGOOOPHORECTOMY;  Surgeon: [name], MD;  Location: [facility];  Service: Gynecology;  Laterality: Bilateral;   SENTINEL NODE BIOPSY N/A 12/09/2019   Procedure: SENTINEL LYMPH  NODE BIOPSY;  Surgeon: [name], MD;  Location: [facility];  Service: Gynecology;  Laterality: N/A;   TEE WITHOUT CARDIOVERSION N/A 03/04/2021   Procedure: TRANSESOPHAGEAL  ECHOCARDIOGRAM (TEE);  Surgeon: [name], MD;  Location: [facility];  Service: Endoscopy;  Laterality: N/A;   TONSILLECTOMY     TUBAL LIGATION     Bilateral   Family History  Problem Relation Age of Onset   Cancer Mother        female   Heart failure Father    Stroke Sister    Cancer Sister        breast   Coronary artery disease Neg Hx    Social History   Socioeconomic History   Marital status: Widowed    Spouse name: widowed   Number of children: 3   Years of education: Not on file   Highest education level: Not on file  Occupational History   Occupation: [employer]  Tobacco Use   Smoking status: Never   Smokeless tobacco: Never  Vaping Use   Vaping Use: Never used  Substance and Sexual Activity  Alcohol use: No   Drug use: No   Sexual activity: Not Currently    Birth control/protection: Post-menopausal  Other Topics Concern   Not on file  Social History Narrative   Married and resides in [place]. 3 children. 3 grandchildren.   Sedentary lifestyle   Social Determinants of Radio broadcast assistant Strain: Low Risk    Difficulty of Paying Living Expenses: Not hard at all  Food Insecurity: No Food Insecurity   Worried About Charity fundraiser in the Last Year: Never true   Ran Out of Food in the Last Year: Never true  Transportation Needs: No Transportation Needs   Lack of Transportation (Medical): No   Lack of Transportation (Non-Medical): No  Physical Activity: Sufficiently Active   Days of Exercise per Week: 5 days   Minutes of Exercise per Session: 30 min  Stress: No Stress Concern Present   Feeling of Stress : Not at all  Social Connections: Moderately Isolated   Frequency of Communication with Friends and Family: More than three times a week   Frequency of Social Gatherings with Friends and Family: More than three times a week   Attends Religious Services: 1 to 4 times per year   Active Member of [employer] or Organizations: No    Attends Archivist Meetings: Never   Marital Status: Widowed    Tobacco Counseling Counseling given: Not Answered   Clinical Intake:  Pre-visit preparation completed: Yes  Pain : No/denies pain     BMI - recorded: 28.29 Nutritional Status: BMI 25 -29 Overweight Nutritional Risks: None Diabetes: Yes  How often do you need to have someone help you when you read instructions, pamphlets, or other written materials from your doctor or pharmacy?: 1 - Never  Diabetic?Nutrition Risk Assessment:  Has the patient had any N/V/D within the last 2 months?  No  Does the patient have any non-healing wounds?  No  Has the patient had any unintentional weight loss or weight gain?  No   Diabetes:  Is the patient diabetic?   Pre-diabetes per pt. If diabetic, was a CBG obtained today?  No  Did the patient bring in their glucometer from home?  No  Phone visit. How often do you monitor your CBG's? Weekly.   Financial Strains and Diabetes Management:  Are you having any financial strains with the device, your supplies or your medication? No .  Does the patient want to be seen by Chronic Care Management for management of their diabetes?  No  Would the patient like to be referred to a Nutritionist or for Diabetic Management?  No   Diabetic Exams:  Diabetic Eye Exam: Completed 2022. Pt has been advised about the importance in completing this exam.  Diabetic Foot Exam: Completed 09/21/2021. Pt has been advised about the importance in completing this exam.  Interpreter Needed?: No  Information entered by :: MJ Rambo Sarafian, LPN   Activities of Daily Living In your present state of health, do you have any difficulty performing the following activities: 11/14/2021 02/28/2021  Hearing? N N  Vision? N N  Difficulty concentrating or making decisions? N N  Walking or climbing stairs? N N  Dressing or bathing? N N  Doing errands, shopping? N N  Preparing Food and eating ? N -  Using the  Toilet? N -  In the past six months, have you accidently leaked urine? N -  Do you have problems with loss of bowel control? N -  Managing  your Medications? N -  Managing your Finances? N -  Housekeeping or managing your Housekeeping? N -  Some recent data might be hidden    Patient Care Team: Dettinger, Fransisca Kaufmann, MD as PCP - General (Family Medicine) Branch, Alphonse Guild, MD as PCP - Cardiology (Cardiology) Felicie Morn, MD (Inactive) (General Surgery) Gala Romney Cristopher Estimable, MD as Consulting Physician (Gastroenterology)  Indicate any recent Medical Services you may have received from other than [facility] providers in the past year (date may be approximate).     Assessment:   This is a routine wellness examination for Heather Barber.  Hearing/Vision screen Hearing Screening - Comments:: No hearing issues.  Vision Screening - Comments:: Readers. Dr. Radford Pax in [place]. 2022.  Dietary issues and exercise activities discussed: Current Exercise Habits: Home exercise routine, Type of exercise: walking, Time (Minutes): 30, Frequency (Times/Week): 5, Weekly Exercise (Minutes/Week): 150, Intensity: Mild, Exercise limited by: cardiac condition(s)   Goals Addressed             This Visit's Progress    Exercise 3x per week (30 min per time)       Continue to exercise. Continue to work in yard and her flowers.        Depression Screen PHQ 2/9 Scores 11/14/2021 10/17/2021 09/21/2021 06/15/2021 03/17/2021 12/17/2020 10/11/2020  PHQ - 2 Score 0 0 0 0 0 0 0  PHQ- 9 Score - 0 - - - - -    Fall Risk Fall Risk  11/14/2021 10/17/2021 09/21/2021 06/15/2021 03/17/2021  Falls in the past year? 0 0 0 0 0  Number falls in past yr: 0 - - - -  Injury with Fall? 0 - - - -  Risk for fall due to : No Fall Risks - - - -  Follow up Falls prevention discussed - - - -    FALL RISK PREVENTION PERTAINING TO THE HOME:  Any stairs in or around the home? No  If so, are there any without handrails? No  Home free of loose  throw rugs in walkways, pet beds, electrical cords, etc? Yes  Adequate lighting in your home to reduce risk of falls? Yes   ASSISTIVE DEVICES UTILIZED TO PREVENT FALLS:  Life alert? No  Use of a cane, walker or w/c? No  Grab bars in the bathroom? Yes  Shower chair or bench in shower? No  Elevated toilet seat or a handicapped toilet? Yes   TIMED UP AND GO:  Was the test performed? No .  Phone visit.  Cognitive Function:     6CIT Screen 11/14/2021 11/14/2021  What Year? 0 points 0 points  What month? 0 points 0 points  What time? 0 points 0 points  Count back from 20 0 points 0 points  Months in reverse 0 points 2 points  Repeat phrase 0 points 0 points  Total Score 0 2    Immunizations Immunization History  Administered Date(s) Administered   Fluad Quad(high Dose 65+) 07/18/2019, 09/16/2020, 09/21/2021   Influenza, High Dose Seasonal PF 08/04/2016, 09/10/2017, 08/14/2018   Influenza,trivalent, recombinat, inj, PF 08/13/2014, 08/02/2015   Moderna Sars-Covid-2 Vaccination 12/25/2019, 01/23/2020, 09/11/2020   Pneumococcal Conjugate-13 11/25/2015   Pneumococcal Polysaccharide-23 11/30/2016    TDAP status: Due, Education has been provided regarding the importance of this vaccine. Advised may receive this vaccine at local pharmacy or Health Dept. Aware to provide a copy of the vaccination record if obtained from local pharmacy or Health Dept. Verbalized acceptance and understanding.  Flu Vaccine status: Up  to date  Pneumococcal vaccine status: Up to date  Covid-19 vaccine status: Completed vaccines  Qualifies for Shingles Vaccine? Yes   Zostavax completed No   Shingrix Completed?: No.    Education has been provided regarding the importance of this vaccine. Patient has been advised to call insurance company to determine out of pocket expense if they have not yet received this vaccine. Advised may also receive vaccine at local pharmacy or Health Dept. Verbalized acceptance and  understanding.  Screening Tests Health Maintenance  Topic Date Due   COVID-19 Vaccine (4 - Booster for Moderna series) 11/06/2020   Zoster Vaccines- Shingrix (1 of 2) 12/22/2021 (Originally 04/26/1965)   OPHTHALMOLOGY EXAM  04/10/2022 (Originally 04/26/1956)   COLONOSCOPY (Pts 45-53yrs Insurance coverage will need to be confirmed)  09/21/2022 (Originally 04/27/1991)   TETANUS/TDAP  09/21/2022 (Originally 04/26/1965)   HEMOGLOBIN A1C  03/21/2022   FOOT EXAM  09/21/2022   Pneumonia Vaccine 57+ Years old  Completed   INFLUENZA VACCINE  Completed   DEXA SCAN  Completed   Hepatitis C Screening  Completed   HPV VACCINES  Aged Out    Health Maintenance  Health Maintenance Due  Topic Date Due   COVID-19 Vaccine (4 - Booster for Moderna series) 11/06/2020    Colorectal cancer screening: Type of screening: Cologuard. Completed 07/20/2021. Repeat every 3 years  Mammogram status: Completed 06/16/2021. Repeat every year  Bone Density status: Completed 09/17/2020. Results reflect: Bone density results: OSTEOPENIA. Repeat every 2 years.  Lung Cancer Screening: (Low Dose CT Chest recommended if Age 78-80 years, 30 pack-year currently smoking OR have quit w/in 15years.) does not qualify.  Additional Screening:  Hepatitis C Screening: does qualify; Completed 07/18/2019  Vision Screening: Recommended annual ophthalmology exams for early detection of glaucoma and other disorders of the eye. Is the patient up to date with their annual eye exam?  Yes  Who is the provider or what is the name of the office in which the patient attends annual eye exams? Dr. Radford Pax in [place]. If pt is not established with a provider, would they like to be referred to a provider to establish care? No .   Dental Screening: Recommended annual dental exams for proper oral hygiene  Community Resource Referral / Chronic Care Management: CRR required this visit?  No   CCM required this visit?  Yes      Plan:     I have  personally reviewed and noted the following in the patients chart:   Medical and social history Use of alcohol, tobacco or illicit drugs  Current medications and supplements including opioid prescriptions. Patient is not currently taking opioid prescriptions. Functional ability and status Nutritional status Physical activity Advanced directives List of other physicians Hospitalizations, surgeries, and ER visits in previous 12 months Vitals Screenings to include cognitive, depression, and falls Referrals and appointments  In addition, I have reviewed and discussed with patient certain preventive protocols, quality metrics, and best practice recommendations. A written personalized care plan for preventive services as well as general preventive health recommendations were provided to patient.     Chriss Driver, LPN   [id number]   Nurse Notes: Pt c/o issues with being able to afford Eliquis. Offered referral to [facility] and pt is agreeable. Referral made. Discussed Td and Shingles vaccines and how to obtain.

## 2021-11-14 NOTE — Patient Instructions (Signed)
Ms. Dejarnett , Thank you for taking time to come for your Medicare Wellness Visit. I appreciate your ongoing commitment to your health goals. Please review the following plan we discussed and let me know if I can assist you in the future.   Screening recommendations/referrals: Colonoscopy: Cologuard done 07/20/2021. Repeat every 3 years.  Mammogram: Done 06/16/2021  Repeat annually  Bone Density: Done 09/17/2020 Repeat every 2 years  Recommended yearly ophthalmology/optometry visit for glaucoma screening and checkup Recommended yearly dental visit for hygiene and checkup  Vaccinations: Influenza vaccine: Done 09/21/2021 Repeat annually  Pneumococcal vaccine: Done 11/25/2015 and 11/30/2016. Tdap vaccine: Due Repeat in 10 years  Shingles vaccine: Shingrix discussed. Please contact your pharmacy for coverage information.     Covid-19:Done 12/25/2019, 01/23/2020 and 09/11/2020.  Advanced directives: Advance directive discussed with you today. I have provided a copy for you to complete at home and have notarized. Once this is complete please bring a copy in to our office so we can scan it into your chart.   Conditions/risks identified: Aim for 30 minutes of exercise or brisk walking each day, drink 6-8 glasses of water and eat lots of fruits and vegetables. KEEP UP THE GOOD WORK!!  Next appointment: Follow up in one year for your annual wellness visit 2024.   Preventive Care 23 Years and Older, Female Preventive care refers to lifestyle choices and visits with your health care provider that can promote health and wellness. What does preventive care include? A yearly physical exam. This is also called an annual well check. Dental exams once or twice a year. Routine eye exams. Ask your health care provider how often you should have your eyes checked. Personal lifestyle choices, including: Daily care of your teeth and gums. Regular physical activity. Eating a healthy diet. Avoiding tobacco  and drug use. Limiting alcohol use. Practicing safe sex. Taking low-dose aspirin every day. Taking vitamin and mineral supplements as recommended by your health care provider. What happens during an annual well check? The services and screenings done by your health care provider during your annual well check will depend on your age, overall health, lifestyle risk factors, and family history of disease. Counseling  Your health care provider may ask you questions about your: Alcohol use. Tobacco use. Drug use. Emotional well-being. Home and relationship well-being. Sexual activity. Eating habits. History of falls. Memory and ability to understand (cognition). Work and work Statistician. Reproductive health. Screening  You may have the following tests or measurements: Height, weight, and BMI. Blood pressure. Lipid and cholesterol levels. These may be checked every 5 years, or more frequently if you are over 61 years old. Skin check. Lung cancer screening. You may have this screening every year starting at age 36 if you have a 30-pack-year history of smoking and currently smoke or have quit within the past 15 years. Fecal occult blood test (FOBT) of the stool. You may have this test every year starting at age 70. Flexible sigmoidoscopy or colonoscopy. You may have a sigmoidoscopy every 5 years or a colonoscopy every 10 years starting at age 55. Hepatitis C blood test. Hepatitis B blood test. Sexually transmitted disease (STD) testing. Diabetes screening. This is done by checking your blood sugar (glucose) after you have not eaten for a while (fasting). You may have this done every 1-3 years. Bone density scan. This is done to screen for osteoporosis. You may have this done starting at age 54. Mammogram. This may be done every 1-2 years. Talk to your  health care provider about how often you should have regular mammograms. Talk with your health care provider about your test results,  treatment options, and if necessary, the need for more tests. Vaccines  Your health care provider may recommend certain vaccines, such as: Influenza vaccine. This is recommended every year. Tetanus, diphtheria, and acellular pertussis (Tdap, Td) vaccine. You may need a Td booster every 10 years. Zoster vaccine. You may need this after age 64. Pneumococcal 13-valent conjugate (PCV13) vaccine. One dose is recommended after age 74. Pneumococcal polysaccharide (PPSV23) vaccine. One dose is recommended after age 53. Talk to your health care provider about which screenings and vaccines you need and how often you need them. This information is not intended to replace advice given to you by your health care provider. Make sure you discuss any questions you have with your health care provider. Document Released: 11/12/2015 Document Revised: 07/05/2016 Document Reviewed: 08/17/2015 Elsevier Interactive Patient Education  2017 [place] Prevention in the Home Falls can cause injuries. They can happen to people of all ages. There are many things you can do to make your home safe and to help prevent falls. What can I do on the outside of my home? Regularly fix the edges of walkways and driveways and fix any cracks. Remove anything that might make you trip as you walk through a door, such as a raised step or threshold. Trim any bushes or trees on the path to your home. Use bright outdoor lighting. Clear any walking paths of anything that might make someone trip, such as rocks or tools. Regularly check to see if handrails are loose or broken. Make sure that both sides of any steps have handrails. Any raised decks and porches should have guardrails on the edges. Have any leaves, snow, or ice cleared regularly. Use sand or salt on walking paths during winter. Clean up any spills in your garage right away. This includes oil or grease spills. What can I do in the bathroom? Use night  lights. Install grab bars by the toilet and in the tub and shower. Do not use towel bars as grab bars. Use non-skid mats or decals in the tub or shower. If you need to sit down in the shower, use a plastic, non-slip stool. Keep the floor dry. Clean up any water that spills on the floor as soon as it happens. Remove soap buildup in the tub or shower regularly. Attach bath mats securely with double-sided non-slip rug tape. Do not have throw rugs and other things on the floor that can make you trip. What can I do in the bedroom? Use night lights. Make sure that you have a light by your bed that is easy to reach. Do not use any sheets or blankets that are too big for your bed. They should not hang down onto the floor. Have a firm chair that has side arms. You can use this for support while you get dressed. Do not have throw rugs and other things on the floor that can make you trip. What can I do in the kitchen? Clean up any spills right away. Avoid walking on wet floors. Keep items that you use a lot in easy-to-reach places. If you need to reach something above you, use a strong step stool that has a grab bar. Keep electrical cords out of the way. Do not use floor polish or wax that makes floors slippery. If you must use wax, use non-skid floor wax. Do not have  throw rugs and other things on the floor that can make you trip. What can I do with my stairs? Do not leave any items on the stairs. Make sure that there are handrails on both sides of the stairs and use them. Fix handrails that are broken or loose. Make sure that handrails are as long as the stairways. Check any carpeting to make sure that it is firmly attached to the stairs. Fix any carpet that is loose or worn. Avoid having throw rugs at the top or bottom of the stairs. If you do have throw rugs, attach them to the floor with carpet tape. Make sure that you have a light switch at the top of the stairs and the bottom of the stairs. If  you do not have them, ask someone to add them for you. What else can I do to help prevent falls? Wear shoes that: Do not have high heels. Have rubber bottoms. Are comfortable and fit you well. Are closed at the toe. Do not wear sandals. If you use a stepladder: Make sure that it is fully opened. Do not climb a closed stepladder. Make sure that both sides of the stepladder are locked into place. Ask someone to hold it for you, if possible. Clearly mark and make sure that you can see: Any grab bars or handrails. First and last steps. Where the edge of each step is. Use tools that help you move around (mobility aids) if they are needed. These include: Canes. Walkers. Scooters. Crutches. Turn on the lights when you go into a dark area. Replace any light bulbs as soon as they burn out. Set up your furniture so you have a clear path. Avoid moving your furniture around. If any of your floors are uneven, fix them. If there are any pets around you, be aware of where they are. Review your medicines with your doctor. Some medicines can make you feel dizzy. This can increase your chance of falling. Ask your doctor what other things that you can do to help prevent falls. This information is not intended to replace advice given to you by your health care provider. Make sure you discuss any questions you have with your health care provider. Document Released: 08/12/2009 Document Revised: 03/23/2016 Document Reviewed: 11/20/2014 Elsevier Interactive Patient Education  2017 [employer].

## 2021-11-16 ENCOUNTER — Telehealth: Payer: Self-pay

## 2021-11-16 NOTE — Chronic Care Management (AMB) (Signed)
°  Chronic Care Management   Outreach Note  11/16/2021 Name: Heather Barber MRN: [id number] DOB: May 19, 1946  Heather Barber is a 76 y.o. year old female who is a primary care patient of Dettinger, Fransisca Kaufmann, MD. I reached out to Heather Barber by phone today in response to a referral sent by Heather Barber's primary care provider.  An unsuccessful telephone outreach was attempted today. The patient was referred to the case management team for assistance with care management and care coordination.   Follow Up Plan: A HIPAA compliant phone message was left for the patient providing contact information and requesting a return call.  The care management team will reach out to the patient again over the next 7 days.  If patient returns call to provider office, please advise to call [facility] * at [phone]*  Noreene Larsson, [place], [facility]  [place], [place] [address] Direct Dial: [phone] [email or web address]: [email or web address]

## 2021-11-21 ENCOUNTER — Other Ambulatory Visit: Payer: Self-pay | Admitting: Family Medicine

## 2021-11-21 ENCOUNTER — Telehealth: Payer: Self-pay | Admitting: Family Medicine

## 2021-11-21 MED ORDER — ATORVASTATIN CALCIUM 20 MG PO TABS: 20.0000 mg | ORAL_TABLET | Freq: Every day | ORAL | 0 refills | Status: DC

## 2021-11-21 NOTE — Telephone Encounter (Signed)
°  Prescription Request  11/21/2021  Is this a "Controlled Substance" medicine? no  Have you seen your PCP in the last 2 weeks? 09/21/21  If YES, route message to pool  -  If NO, patient needs to be scheduled for appointment.  What is the name of the medication or equipment? Lipitor and prilosec  Have you contacted your pharmacy to request a refill? yes   Which pharmacy would you like this sent to? [facility]   Patient notified that their request is being sent to the clinical staff for review and that they should receive a response within 2 business days.

## 2021-11-22 ENCOUNTER — Other Ambulatory Visit: Payer: Self-pay | Admitting: Family Medicine

## 2021-11-23 ENCOUNTER — Ambulatory Visit (INDEPENDENT_AMBULATORY_CARE_PROVIDER_SITE_OTHER): Payer: Medicare Other

## 2021-11-23 ENCOUNTER — Telehealth: Payer: Self-pay

## 2021-11-23 ENCOUNTER — Ambulatory Visit (INDEPENDENT_AMBULATORY_CARE_PROVIDER_SITE_OTHER): Payer: Medicare Other | Admitting: Family Medicine

## 2021-11-23 ENCOUNTER — Encounter: Payer: Self-pay | Admitting: Family Medicine

## 2021-11-23 VITALS — BP 130/70 | HR 77 | Ht 65.0 in | Wt 174.0 lb

## 2021-11-23 DIAGNOSIS — M25551 Pain in right hip: Secondary | ICD-10-CM | POA: Diagnosis not present

## 2021-11-23 DIAGNOSIS — R319 Hematuria, unspecified: Secondary | ICD-10-CM

## 2021-11-23 DIAGNOSIS — I4891 Unspecified atrial fibrillation: Secondary | ICD-10-CM

## 2021-11-23 DIAGNOSIS — N3000 Acute cystitis without hematuria: Secondary | ICD-10-CM

## 2021-11-23 LAB — URINALYSIS
Bilirubin, UA: NEGATIVE
Glucose, UA: NEGATIVE
Nitrite, UA: NEGATIVE
Specific Gravity, UA: 1.025 (ref 1.005–1.030)
Urobilinogen, Ur: 0.2 mg/dL (ref 0.2–1.0)
pH, UA: 5.5 (ref 5.0–7.5)

## 2021-11-23 MED ORDER — CEPHALEXIN 500 MG PO CAPS: 500.0000 mg | ORAL_CAPSULE | Freq: Four times a day (QID) | ORAL | 0 refills | Status: DC

## 2021-11-23 NOTE — Progress Notes (Signed)
BP 130/70    Pulse 77    Ht 5\' 5"  (1.651 m)    Wt 174 lb (78.9 kg)    SpO2 96%    BMI 28.96 kg/m    Subjective:   [name], female    DOB: 23-May-1946, 76 y.o.   MRN: [id number]  HPI: Heather Barber is a 76 y.o. female presenting on 11/23/2021 for Hip Pain (right) and Urinary Tract Infection (Blood in urine and increase in frequency)   HPI Right hip pain Patient is coming in today for right hip pain.  She has been having this pain for some time but it has not been improving, describes Worsening.  She says it hurts on the anterior part of the hip but then sometimes down the lateral aspect of the upper leg as well.  She says it hurts pretty much all day now but it is stiffer when she first gets up.  It is better when she lays down and better when she walks but hurts worse when she stands on it.  Patient also comes in complaining of some hematuria and frequency that she has been having off-and-on over the past week.  She did leave a urinary sample.  Does not know if she has infection but wanted to get it checked out.  She denies any fevers chills or body aches.   Relevant past medical, surgical, family and social history reviewed and updated as indicated. Interim medical history since our last visit reviewed. Allergies and medications reviewed and updated.  Review of Systems  Constitutional:  Negative for chills and fever.  Eyes:  Negative for visual disturbance.  Respiratory:  Negative for chest tightness and shortness of breath.   Cardiovascular:  Negative for chest pain and leg swelling.  Genitourinary:  Positive for dysuria, frequency and hematuria. Negative for difficulty urinating, vaginal bleeding, vaginal discharge and vaginal pain.  Musculoskeletal:  Positive for arthralgias. Negative for back pain and gait problem.  Skin:  Negative for rash.  Neurological:  Negative for dizziness, light-headedness and headaches.  Psychiatric/Behavioral:  Negative for agitation  and behavioral problems.   All other systems reviewed and are negative.  Per HPI unless specifically indicated above   Allergies as of 11/23/2021       Reactions   Codeine Nausea And Vomiting   Morphine Other (See Comments)   GI symptoms        Medication List        Accurate as of November 23, 2021 10:05 AM. If you have any questions, ask your nurse or doctor.          amiodarone 200 MG tablet Commonly known as: Pacerone Take 0.5 tablets (100 mg total) by mouth daily.   apixaban 5 MG Tabs tablet Commonly known as: ELIQUIS Take 1 tablet (5 mg total) by mouth 2 (two) times daily.   atorvastatin 20 MG tablet Commonly known as: LIPITOR Take 1 tablet (20 mg total) by mouth at bedtime.   cephALEXin 500 MG capsule Commonly known as: KEFLEX Take 1 capsule (500 mg total) by mouth 4 (four) times daily. Started by: Fransisca Kaufmann Glinda Natzke, MD   fenofibrate 160 MG tablet Take 1 tablet (160 mg total) by mouth daily.   hydroxyurea 500 MG capsule Commonly known as: HYDREA TAKE 1 CAPSULE BY MOUTH TWICE A DAY   ketoconazole 2 % cream Commonly known as: NIZORAL Apply 1 application topically 2 (two) times daily.   metoprolol succinate 100 MG 24 hr tablet  Commonly known as: TOPROL-XL TAKE 1 TABLET (100 MG TOTAL) 2 (TWO) TIMES DAILY. TAKE WITH OR IMMEDIATELY FOLLOWING A MEAL.   Multivitamin Women 50+ Tabs Take by mouth daily.   naproxen sodium 220 MG tablet Commonly known as: ALEVE Take 220 mg by mouth as needed.   omeprazole 20 MG capsule Commonly known as: PRILOSEC TAKE 1 CAPSULE BY MOUTH EVERY DAY   sacubitril-valsartan 24-26 MG Commonly known as: ENTRESTO Take 1 tablet by mouth 2 (two) times daily.         Objective:   BP 130/70    Pulse 77    Ht 5\' 5"  (1.651 m)    Wt 174 lb (78.9 kg)    SpO2 96%    BMI 28.96 kg/m   Wt Readings from Last 3 Encounters:  11/23/21 174 lb (78.9 kg)  11/14/21 170 lb (77.1 kg)  10/17/21 170 lb 8 oz (77.3 kg)    Physical  Exam Vitals and nursing note reviewed.  Constitutional:      General: She is not in acute distress.    Appearance: She is well-developed. She is not diaphoretic.  Eyes:     Conjunctiva/sclera: Conjunctivae normal.  Cardiovascular:     Rate and Rhythm: Normal rate and regular rhythm.     Heart sounds: Normal heart sounds. No murmur heard. Pulmonary:     Effort: Pulmonary effort is normal. No respiratory distress.     Breath sounds: Normal breath sounds. No wheezing.  Abdominal:     General: Abdomen is flat. Bowel sounds are normal. There is no distension.     Tenderness: There is no abdominal tenderness. There is no guarding or rebound.  Musculoskeletal:        General: Normal range of motion.     Right hip: Tenderness (Right hip pain with range of motion, pain in the anterior hip with range of motion.) present.  Skin:    General: Skin is warm and dry.     Findings: No rash.  Neurological:     Mental Status: She is alert and oriented to person, place, and time.     Coordination: Coordination normal.  Psychiatric:        Behavior: Behavior normal.   Urinalysis: 3+ leukocytes 2+ protein trace ketones 2+ blood   Assessment & Plan:   Problem List Items Addressed This Visit   None Visit Diagnoses     Hematuria, unspecified type    -  Primary   Relevant Medications   cephALEXin (KEFLEX) 500 MG capsule   Other Relevant Orders   Urine Culture   Urinalysis   Right hip pain       Relevant Orders   DG HIP UNILAT W OR W/O PELVIS 2-3 VIEWS RIGHT   Acute cystitis without hematuria       Relevant Medications   cephALEXin (KEFLEX) 500 MG capsule     We will do hip x-ray, if shows anything significant we will consider orthopedic referral such as osteoarthritis versus avascular necrosis versus other issues.  Follow up plan: Return if symptoms worsen or fail to improve.  Counseling provided for all of the vaccine components Orders Placed This Encounter  Procedures   Urine  Culture   DG HIP UNILAT W OR W/O PELVIS 2-3 VIEWS RIGHT   Urinalysis    Caryl Pina, MD [facility] 11/23/2021, 10:05 AM

## 2021-11-23 NOTE — Telephone Encounter (Signed)
Referral placed.  Left message informing pt.

## 2021-11-23 NOTE — Telephone Encounter (Signed)
Please let patient know:  Patient assistance program with eliquis only works when patient has spent 3% out of pocket for household income.  Must bring me proof of spending and make CCM appt at that time. I do not have samples

## 2021-11-23 NOTE — Telephone Encounter (Signed)
Pt takes Eliquis 5mg .  She is asking for samples today during appt. We do not have any.  Pt would like to speak with Heather Barber. She has helped pt in the past. Informed pt that we would get in touch with Heather Barber for her.  Pt does have MCR and has already received assistance with Julie's help.  Does she need another referral, an appt with Heather Barber?

## 2021-11-24 ENCOUNTER — Other Ambulatory Visit: Payer: Self-pay | Admitting: *Deleted

## 2021-11-24 MED ORDER — OMEPRAZOLE 20 MG PO CPDR: DELAYED_RELEASE_CAPSULE | ORAL | 0 refills | Status: DC

## 2021-11-24 NOTE — Telephone Encounter (Signed)
Omeprazole RF sent to [facility], changing pharmacy's TC to [facility] cancelled RF there from 11/21/21

## 2021-11-24 NOTE — Progress Notes (Signed)
Pt returning call

## 2021-11-25 LAB — URINE CULTURE

## 2021-11-28 ENCOUNTER — Telehealth: Payer: Self-pay | Admitting: Family Medicine

## 2021-11-28 DIAGNOSIS — M1611 Unilateral primary osteoarthritis, right hip: Secondary | ICD-10-CM

## 2021-11-28 DIAGNOSIS — M25551 Pain in right hip: Secondary | ICD-10-CM

## 2021-11-28 MED ORDER — SULFAMETHOXAZOLE-TRIMETHOPRIM 800-160 MG PO TABS: 1.0000 | ORAL_TABLET | Freq: Two times a day (BID) | ORAL | 0 refills | Status: DC

## 2021-11-28 NOTE — Addendum Note (Signed)
Addended by: Caryl Pina on: 11/28/2021 01:04 PM   Modules accepted: Orders

## 2021-11-28 NOTE — Telephone Encounter (Signed)
REFERRAL REQUEST Telephone Note  Have you been seen at our office for this problem? yes (Advise that they may need an appointment with their PCP before a referral can be done)  Reason for Referral: arthritis in hip Referral discussed with patient: yes  Best contact number of patient for referral team: [phone]    Has patient been seen by a specialist for this issue before: no  Patient provider preference for referral: does not remember name Patient location preference for referral: [facility]   Patient notified that referrals can take up to a week or longer to process. If they haven't heard anything within a week they should call back and speak with the referral department.

## 2021-11-29 NOTE — Telephone Encounter (Signed)
Placed referral for the patient. 

## 2021-12-01 NOTE — Chronic Care Management (AMB) (Signed)
°  Chronic Care Management   Outreach Note  12/01/2021 Name: Heather Barber MRN: [id number] DOB: 26-Aug-1946  Heather Barber is a 76 y.o. year old female who is a primary care patient of Dettinger, Fransisca Kaufmann, MD. I reached out to Heather Barber by phone today in response to a referral sent by [name]'s primary care provider.  A second unsuccessful telephone outreach was attempted today. The patient was referred to the case management team for assistance with care management and care coordination.   Follow Up Plan: A HIPAA compliant phone message was left for the patient providing contact information and requesting a return call.  The care management team will reach out to the patient again over the next 7 days.  If patient returns call to provider office, please advise to call [facility] * at [phone]*  Noreene Larsson, [place], [place] Management  [place], [place] [address] Direct Dial: [phone] [email or web address] Website: [email or web address]

## 2021-12-07 NOTE — Progress Notes (Deleted)
NO SHOW

## 2021-12-07 NOTE — Chronic Care Management (AMB) (Signed)
°  Chronic Care Management   Outreach Note  12/07/2021 Name: Heather Barber MRN: [id number] DOB: August 06, 1946  Heather Barber is a 76 y.o. year old female who is a primary care patient of Dettinger, Fransisca Kaufmann, MD. I reached out to Heather Barber by phone today in response to a referral sent by Ms. Francene Boyers Latino's primary care provider.  Third unsuccessful telephone outreach was attempted today. The patient was referred to the case management team for assistance with care management and care coordination. The patient's primary care provider has been notified of our unsuccessful attempts to make or maintain contact with the patient. The care management team is pleased to engage with this patient at any time in the future should he/she be interested in assistance from the care management team.   Follow Up Plan: We have been unable to make contact with the patient for follow up. The care management team is available to follow up with the patient after provider conversation with the patient regarding recommendation for care management engagement and subsequent re-referral to the care management team.   Noreene Larsson, [place], [place], [place] [address] Direct Dial: [phone] [email or web address]: [email or web address]

## 2021-12-08 ENCOUNTER — Ambulatory Visit (HOSPITAL_COMMUNITY): Payer: Medicare Other | Admitting: Physician Assistant

## 2021-12-08 ENCOUNTER — Inpatient Hospital Stay (HOSPITAL_COMMUNITY): Payer: Medicare Other | Attending: Hematology

## 2021-12-26 ENCOUNTER — Ambulatory Visit (INDEPENDENT_AMBULATORY_CARE_PROVIDER_SITE_OTHER): Payer: Medicare Other | Admitting: Family Medicine

## 2021-12-26 ENCOUNTER — Telehealth: Payer: Self-pay | Admitting: Family Medicine

## 2021-12-26 ENCOUNTER — Encounter: Payer: Self-pay | Admitting: Family Medicine

## 2021-12-26 VITALS — BP 131/61 | HR 81 | Wt 173.0 lb

## 2021-12-26 DIAGNOSIS — I10 Essential (primary) hypertension: Secondary | ICD-10-CM | POA: Diagnosis not present

## 2021-12-26 DIAGNOSIS — K219 Gastro-esophageal reflux disease without esophagitis: Secondary | ICD-10-CM

## 2021-12-26 DIAGNOSIS — E781 Pure hyperglyceridemia: Secondary | ICD-10-CM

## 2021-12-26 DIAGNOSIS — E782 Mixed hyperlipidemia: Secondary | ICD-10-CM | POA: Diagnosis not present

## 2021-12-26 DIAGNOSIS — R7989 Other specified abnormal findings of blood chemistry: Secondary | ICD-10-CM

## 2021-12-26 DIAGNOSIS — E1169 Type 2 diabetes mellitus with other specified complication: Secondary | ICD-10-CM

## 2021-12-26 LAB — BAYER DCA HB A1C WAIVED: HB A1C (BAYER DCA - WAIVED): 6.3 % — ABNORMAL HIGH (ref 4.8–5.6)

## 2021-12-26 MED ORDER — OMEPRAZOLE 20 MG PO CPDR: 20.0000 mg | DELAYED_RELEASE_CAPSULE | Freq: Every day | ORAL | 3 refills | Status: DC

## 2021-12-26 MED ORDER — ATORVASTATIN CALCIUM 20 MG PO TABS: 20.0000 mg | ORAL_TABLET | Freq: Every day | ORAL | 3 refills | Status: DC

## 2021-12-26 NOTE — Progress Notes (Signed)
BP 131/61    Pulse 81    Wt 173 lb (78.5 kg)    SpO2 97%    BMI 28.79 kg/m    Subjective:   [name], female    DOB: May 29, 1946, 76 y.o.   MRN: [id number]  HPI: Heather Barber is a 76 y.o. female presenting on 12/26/2021 for Medical Management of Chronic Issues, Diabetes, and Hypertension   HPI Type 2 diabetes mellitus Patient comes in today for recheck of his diabetes. Patient has been currently taking A1c looks good at 6.3, patient is currently diet controlled.. Patient is currently on an ACE inhibitor/ARB. Patient has not seen an ophthalmologist this year. Patient denies any issues with their feet. The symptom started onset as an adult hypertension and hyperlipidemia ARE RELATED TO DM   Hypertension and A-fib, sees cardiology Patient is currently on Entresto and metoprolol and amiodarone, and their blood pressure today is 131/61. Patient denies any lightheadedness or dizziness. Patient denies headaches, blurred vision, chest pains, shortness of breath, or weakness. Denies any side effects from medication and is content with current medication.   Hyperlipidemia Patient is coming in for recheck of his hyperlipidemia. The patient is currently taking atorvastatin and fenofibrate. They deny any issues with myalgias or history of liver damage from it. They deny any focal numbness or weakness or chest pain.   Relevant past medical, surgical, family and social history reviewed and updated as indicated. Interim medical history since our last visit reviewed. Allergies and medications reviewed and updated.  Review of Systems  Constitutional:  Negative for chills and fever.  HENT:  Negative for ear pain and tinnitus.   Eyes:  Negative for pain.  Respiratory:  Negative for cough, shortness of breath and wheezing.   Cardiovascular:  Negative for chest pain, palpitations and leg swelling.  Gastrointestinal:  Negative for abdominal pain, blood in stool, constipation and diarrhea.   Genitourinary:  Negative for dysuria and hematuria.  Musculoskeletal:  Negative for back pain and myalgias.  Skin:  Negative for rash.  Neurological:  Negative for dizziness, weakness and headaches.  Psychiatric/Behavioral:  Negative for suicidal ideas.    Per HPI unless specifically indicated above   Allergies as of 12/26/2021       Reactions   Codeine Nausea And Vomiting   Morphine Other (See Comments)   GI symptoms        Medication List        Accurate as of December 26, 2021  3:59 PM. If you have any questions, ask your nurse or doctor.          STOP taking these medications    cephALEXin 500 MG capsule Commonly known as: KEFLEX Stopped by: Fransisca Kaufmann Remi Rester, MD   sulfamethoxazole-trimethoprim 800-160 MG tablet Commonly known as: BACTRIM DS Stopped by: Fransisca Kaufmann Shironda Kain, MD       TAKE these medications    amiodarone 200 MG tablet Commonly known as: Pacerone Take 0.5 tablets (100 mg total) by mouth daily.   apixaban 5 MG Tabs tablet Commonly known as: ELIQUIS Take 1 tablet (5 mg total) by mouth 2 (two) times daily.   atorvastatin 20 MG tablet Commonly known as: LIPITOR Take 1 tablet (20 mg total) by mouth at bedtime.   fenofibrate 160 MG tablet Take 1 tablet (160 mg total) by mouth daily.   hydroxyurea 500 MG capsule Commonly known as: HYDREA TAKE 1 CAPSULE BY MOUTH TWICE A DAY   ketoconazole 2 % cream Commonly  known as: NIZORAL Apply 1 application topically 2 (two) times daily.   metoprolol succinate 100 MG 24 hr tablet Commonly known as: TOPROL-XL TAKE 1 TABLET (100 MG TOTAL) 2 (TWO) TIMES DAILY. TAKE WITH OR IMMEDIATELY FOLLOWING A MEAL.   Multivitamin Women 50+ Tabs Take by mouth daily.   naproxen sodium 220 MG tablet Commonly known as: ALEVE Take 220 mg by mouth as needed.   omeprazole 20 MG capsule Commonly known as: PRILOSEC Take 1 capsule (20 mg total) by mouth daily. What changed:  how much to take how to take  this when to take this additional instructions Changed by: Fransisca Kaufmann Derico Mitton, MD   sacubitril-valsartan 24-26 MG Commonly known as: ENTRESTO Take 1 tablet by mouth 2 (two) times daily.         Objective:   BP 131/61    Pulse 81    Wt 173 lb (78.5 kg)    SpO2 97%    BMI 28.79 kg/m   Wt Readings from Last 3 Encounters:  12/26/21 173 lb (78.5 kg)  11/23/21 174 lb (78.9 kg)  11/14/21 170 lb (77.1 kg)    Physical Exam Vitals and nursing note reviewed.  Constitutional:      General: She is not in acute distress.    Appearance: She is well-developed. She is not diaphoretic.  Eyes:     Conjunctiva/sclera: Conjunctivae normal.  Cardiovascular:     Rate and Rhythm: Normal rate and regular rhythm.     Heart sounds: Normal heart sounds. No murmur heard. Pulmonary:     Effort: Pulmonary effort is normal. No respiratory distress.     Breath sounds: Normal breath sounds. No wheezing.  Musculoskeletal:        General: No swelling or tenderness. Normal range of motion.  Skin:    General: Skin is warm and dry.     Findings: No rash.  Neurological:     Mental Status: She is alert and oriented to person, place, and time.     Coordination: Coordination normal.  Psychiatric:        Behavior: Behavior normal.      Assessment & Plan:   Problem List Items Addressed This Visit       Cardiovascular and Mediastinum   Hypertension   Relevant Medications   atorvastatin (LIPITOR) 20 MG tablet     Digestive   Gastroesophageal reflux disease   Relevant Medications   omeprazole (PRILOSEC) 20 MG capsule     Endocrine   Type 2 diabetes mellitus with other specified complication ([name]) - Primary   Relevant Medications   atorvastatin (LIPITOR) 20 MG tablet   Other Relevant Orders   Bayer DCA Hb A1c Waived     Other   Hyperlipidemia   Relevant Medications   atorvastatin (LIPITOR) 20 MG tablet   Hypertriglyceridemia   Relevant Medications   atorvastatin (LIPITOR) 20 MG tablet    Elevated TSH   Relevant Orders   Thyroid Panel With TSH   CMP14+EGFR    Thyroid was off, will recheck today.  1C looks good at 6.3 Follow up plan: Return in about 3 months (around 03/25/2022), or if symptoms worsen or fail to improve, for Diabetes and hypertension and thyroid.  Counseling provided for all of the vaccine components Orders Placed This Encounter  Procedures   Bayer DCA Hb A1c Waived   Thyroid Panel With TSH   CMP14+EGFR    Caryl Pina, MD [facility] 12/26/2021, 3:59 PM

## 2021-12-27 ENCOUNTER — Telehealth: Payer: Self-pay

## 2021-12-27 LAB — THYROID PANEL WITH TSH
Free Thyroxine Index: 0.7 — ABNORMAL LOW (ref 1.2–4.9)
T3 Uptake Ratio: 20 % — ABNORMAL LOW (ref 24–39)
T4, Total: 3.5 ug/dL — ABNORMAL LOW (ref 4.5–12.0)
TSH: 28.8 u[IU]/mL — ABNORMAL HIGH (ref 0.450–4.500)

## 2021-12-27 LAB — CMP14+EGFR
ALT: 18 IU/L (ref 0–32)
AST: 19 IU/L (ref 0–40)
Albumin/Globulin Ratio: 2.3 — ABNORMAL HIGH (ref 1.2–2.2)
Albumin: 4.5 g/dL (ref 3.7–4.7)
Alkaline Phosphatase: 49 IU/L (ref 44–121)
BUN/Creatinine Ratio: 18 (ref 12–28)
BUN: 18 mg/dL (ref 8–27)
Bilirubin Total: <0.2 mg/dL (ref 0.0–1.2)
CO2: 21 mmol/L (ref 20–29)
Calcium: 9.4 mg/dL (ref 8.7–10.3)
Chloride: 105 mmol/L (ref 96–106)
Creatinine, Ser: 1.02 mg/dL — ABNORMAL HIGH (ref 0.57–1.00)
Globulin, Total: 2 g/dL (ref 1.5–4.5)
Glucose: 145 mg/dL — ABNORMAL HIGH (ref 70–99)
Potassium: 4.6 mmol/L (ref 3.5–5.2)
Sodium: 144 mmol/L (ref 134–144)
Total Protein: 6.5 g/dL (ref 6.0–8.5)
eGFR: 57 mL/min/{1.73_m2} — ABNORMAL LOW (ref >59)

## 2021-12-27 NOTE — Chronic Care Management (AMB) (Signed)
Chronic Care Management   Note  12/27/2021 Name: Heather Barber MRN: [id number] DOB: 1945-12-25  Heather Barber is a 76 y.o. year old female who is a primary care patient of Dettinger, Fransisca Kaufmann, MD. I reached out to Heather Barber by phone today in response to a referral sent by [name]'s PCP.  Ms. All was given information about Chronic Care Management services today including:  CCM service includes personalized support from designated clinical staff supervised by her physician, including individualized plan of care and coordination with other care providers 24/7 contact phone numbers for assistance for urgent and routine care needs. Service will only be billed when office clinical staff spend 20 minutes or more in a month to coordinate care. Only one practitioner may furnish and bill the service in a calendar month. The patient may stop CCM services at any time (effective at the end of the month) by phone call to the office staff. The patient is responsible for co-pay (up to 20% after annual deductible is met) if co-pay is required by the individual health plan.   Patient agreed to services and verbal consent obtained.   Follow up plan: Face to Face appointment with care management team member scheduled for: 12/28/2021  Noreene Larsson, [place], [place], [place] [address] Direct Dial: [phone] [email or web address]

## 2021-12-28 ENCOUNTER — Ambulatory Visit (INDEPENDENT_AMBULATORY_CARE_PROVIDER_SITE_OTHER): Payer: Medicare Other | Admitting: Pharmacist

## 2021-12-28 DIAGNOSIS — I4819 Other persistent atrial fibrillation: Secondary | ICD-10-CM

## 2021-12-28 DIAGNOSIS — I10 Essential (primary) hypertension: Secondary | ICD-10-CM

## 2021-12-28 NOTE — Progress Notes (Incomplete)
[place] due 03/05/22 Will re apply for BMS when patient meets out of pocket

## 2022-01-04 NOTE — Patient Instructions (Incomplete)
Visit Information  Following are the goals we discussed today:  ***(Copy and paste patient goals from clinical care plan here)  Plan: Telephone follow up appointment with care management team member scheduled for:  1 MONTH  Signature Regina Eck, PharmD, BCPS Clinical Pharmacist, [place]  II Phone [phone]   Please call the care guide team at [phone] if you need to cancel or reschedule your appointment.   The patient verbalized understanding of instructions, educational materials, and care plan provided today and agreed to receive a mailed copy of patient instructions, educational materials, and care plan.

## 2022-01-09 ENCOUNTER — Other Ambulatory Visit: Payer: Self-pay

## 2022-01-09 MED ORDER — LEVOTHYROXINE SODIUM 50 MCG PO TABS: 50.0000 ug | ORAL_TABLET | Freq: Every day | ORAL | 1 refills | Status: DC

## 2022-01-12 ENCOUNTER — Telehealth: Payer: Self-pay | Admitting: Family Medicine

## 2022-01-12 MED ORDER — LEVOTHYROXINE SODIUM 50 MCG PO TABS: 50.0000 ug | ORAL_TABLET | Freq: Every day | ORAL | 1 refills | Status: DC

## 2022-01-12 NOTE — Telephone Encounter (Signed)
Done

## 2022-01-17 ENCOUNTER — Inpatient Hospital Stay (HOSPITAL_COMMUNITY): Payer: Medicare Other | Attending: Hematology

## 2022-01-17 DIAGNOSIS — D75839 Thrombocytosis, unspecified: Secondary | ICD-10-CM

## 2022-01-17 DIAGNOSIS — E611 Iron deficiency: Secondary | ICD-10-CM

## 2022-01-17 DIAGNOSIS — D649 Anemia, unspecified: Secondary | ICD-10-CM

## 2022-01-17 DIAGNOSIS — E538 Deficiency of other specified B group vitamins: Secondary | ICD-10-CM

## 2022-01-17 DIAGNOSIS — C541 Malignant neoplasm of endometrium: Secondary | ICD-10-CM | POA: Insufficient documentation

## 2022-01-17 LAB — COMPREHENSIVE METABOLIC PANEL
ALT: 19 U/L (ref 0–44)
AST: 19 U/L (ref 15–41)
Albumin: 4.2 g/dL (ref 3.5–5.0)
Alkaline Phosphatase: 57 U/L (ref 38–126)
Anion gap: 11 (ref 5–15)
BUN: 22 mg/dL (ref 8–23)
CO2: 24 mmol/L (ref 22–32)
Calcium: 9.2 mg/dL (ref 8.9–10.3)
Chloride: 105 mmol/L (ref 98–111)
Creatinine, Ser: 0.85 mg/dL (ref 0.44–1.00)
GFR, Estimated: >60 mL/min (ref >60)
Glucose, Bld: 206 mg/dL — ABNORMAL HIGH (ref 70–99)
Potassium: 4 mmol/L (ref 3.5–5.1)
Sodium: 140 mmol/L (ref 135–145)
Total Bilirubin: 0.4 mg/dL (ref 0.3–1.2)
Total Protein: 6.9 g/dL (ref 6.5–8.1)

## 2022-01-17 LAB — CBC WITH DIFFERENTIAL/PLATELET
Abs Immature Granulocytes: 0.02 10*3/uL (ref 0.00–0.07)
Basophils Absolute: 0 10*3/uL (ref 0.0–0.1)
Basophils Relative: 1 %
Eosinophils Absolute: 0.1 10*3/uL (ref 0.0–0.5)
Eosinophils Relative: 2 %
HCT: 36.3 % (ref 36.0–46.0)
Hemoglobin: 12.4 g/dL (ref 12.0–15.0)
Immature Granulocytes: 0 %
Lymphocytes Relative: 38 %
Lymphs Abs: 1.8 10*3/uL (ref 0.7–4.0)
MCH: 38.3 pg — ABNORMAL HIGH (ref 26.0–34.0)
MCHC: 34.2 g/dL (ref 30.0–36.0)
MCV: 112 fL — ABNORMAL HIGH (ref 80.0–100.0)
Monocytes Absolute: 0.3 10*3/uL (ref 0.1–1.0)
Monocytes Relative: 7 %
Neutro Abs: 2.4 10*3/uL (ref 1.7–7.7)
Neutrophils Relative %: 52 %
Platelets: 305 10*3/uL (ref 150–400)
RBC: 3.24 MIL/uL — ABNORMAL LOW (ref 3.87–5.11)
RDW: 12.2 % (ref 11.5–15.5)
WBC: 4.6 10*3/uL (ref 4.0–10.5)
nRBC: 0 % (ref 0.0–0.2)

## 2022-01-17 LAB — VITAMIN B12: Vitamin B-12: 202 pg/mL (ref 180–914)

## 2022-01-17 LAB — IRON AND TIBC
Iron: 95 ug/dL (ref 28–170)
Saturation Ratios: 18 % (ref 10.4–31.8)
TIBC: 514 ug/dL — ABNORMAL HIGH (ref 250–450)
UIBC: 419 ug/dL

## 2022-01-17 LAB — FERRITIN: Ferritin: 55 ng/mL (ref 11–307)

## 2022-01-17 LAB — LACTATE DEHYDROGENASE: LDH: 152 U/L (ref 98–192)

## 2022-01-19 ENCOUNTER — Inpatient Hospital Stay (HOSPITAL_COMMUNITY): Payer: Medicare Other

## 2022-01-26 ENCOUNTER — Ambulatory Visit (HOSPITAL_COMMUNITY): Payer: Medicare Other | Admitting: Physician Assistant

## 2022-01-27 DIAGNOSIS — I4819 Other persistent atrial fibrillation: Secondary | ICD-10-CM

## 2022-01-27 DIAGNOSIS — I10 Essential (primary) hypertension: Secondary | ICD-10-CM

## 2022-02-10 NOTE — Progress Notes (Signed)
? ?Heather Barber ?[address] ?[place], [place] [address] ? ? ?CLINIC:  ?Medical Oncology/Hematology ? ?PCP:  ?Barber, Heather Kaufmann, MD ?[address] ?[place] [address] ?[phone] ? ? ?REASON FOR VISIT:  ?Follow-up for JAK2 positive essential thrombocytosis ?  ?PRIOR THERAPY: None ?  ?CURRENT THERAPY: Hydrea 500 mg once daily  ?  ?INTERVAL HISTORY:  ?Heather Barber 76 y.o. female returns for routine follow-up of her essential thrombocytosis.  She was last seen by Heather Abernethy PA-C on 09/08/2021. ?  ?At today's visit, she reports feeling fair.  No recent hospitalizations, surgeries, or changes in baseline health status. ? ?She is tolerating Hydrea well.  She denies any GI symptoms such as nausea, vomiting, diarrhea.  She has not noted any mouth sores or skin ulcers.  Energy levels are stable with some mild fatigue. ?  ?She denies any erythromelalgia, aquagenic pruritus, Raynaud's phenomenon, or vasomotor symptoms.  No signs or symptoms of DVT or PE.  No B symptoms such as fever, chills, night sweats, unintentional weight loss. She denies any pelvic pain or vaginal bleeding, in light of her history of previous endometrial cancer. ? ?Regarding her anemia, she denies any chest pain, dyspnea on exertion, syncopal episodes.   ?She has not noticed any bleeding such as epistaxis, hematemesis, hematochezia, or melena. ? ?She has 80% energy and 100% appetite. She endorses that she is maintaining a stable weight. ? ? ?REVIEW OF SYSTEMS:  ?Review of Systems  ?Constitutional:  Positive for fatigue (mild). Negative for appetite change, chills, diaphoresis, fever and unexpected weight change.  ?HENT:   Negative for lump/mass and nosebleeds.   ?Eyes:  Negative for eye problems.  ?Respiratory:  Negative for cough, hemoptysis and shortness of breath.   ?Cardiovascular:  Negative for chest pain, leg swelling and palpitations.  ?Gastrointestinal:  Negative for abdominal pain, blood in stool, constipation, diarrhea,  nausea and vomiting.  ?Genitourinary:  Negative for hematuria.   ?Musculoskeletal:  Positive for arthralgias (right hip).  ?Skin: Negative.   ?Neurological:  Negative for dizziness, headaches and light-headedness.  ?Hematological:  Does not bruise/bleed easily.   ? ? ?PAST MEDICAL/SURGICAL HISTORY:  ?Past Medical History:  ?Diagnosis Date  ? (HFpEF) heart failure with preserved ejection fraction ([place])   ? a. EF previously 30-35% in 02/2021 in the setting of afib w/RVR --> normalized by repeat imaging in 05/2021.  ? Anxiety   ? Atrial flutter ([place])   ? a. s/p DCCV in 02/2021  ? Atrial septal defect   ? Surgical repair in 1994  ? Cardiomyopathy ([place])   ? Chest pain   ? Normal coronary angiography in 1994 and 2011  ? Dizziness   ? Endometrial cancer, grade I ([place]) 10/09/2019  ? Essential thrombocythemia ([place]) 06/06/2016  ? GERD (gastroesophageal reflux disease)   ? History of kidney stones   ? Hyperlipidemia   ? Hypertension   ? LBBB (left bundle branch block) 2011  ? Mitral regurgitation   ? Palpitations   ? Persistent atrial fibrillation ([place])   ? PONV (postoperative nausea and vomiting)   ? Syncope   ? Tricuspid regurgitation   ? Type 2 diabetes mellitus with other specified complication ([place]) 46/27/0350  ? Ventricular tachycardia   ? Exercise induced  ? ?Past Surgical History:  ?Procedure Laterality Date  ? APPENDECTOMY    ? ASD REPAIR  1994  ? [facility]  ? BREAST EXCISIONAL BIOPSY    ? Right and left  ? BREAST SURGERY    ?  right and left breast-benign  ? CARDIAC CATHETERIZATION  12/24/2009  ? [name]   ? CARDIOVERSION N/A 03/04/2021  ? Procedure: CARDIOVERSION;  Surgeon: [name], MD;  Location: [facility];  Service: Endoscopy;  Laterality: N/A;  ? CATARACT EXTRACTION W/PHACO Left 03/11/2013  ? Procedure: CATARACT EXTRACTION PHACO AND INTRAOCULAR LENS PLACEMENT (IOC);  Surgeon: [name], MD;  Location: [facility];  Service: Ophthalmology;  Laterality: Left;  CDE:  12.20  ? HYSTEROSCOPY WITH D &  C N/A 11/11/2019  ? Procedure: DILATATION AND CURETTAGE /HYSTEROSCOPY;  Surgeon: [name], MD;  Location: [facility];  Service: Gynecology;  Laterality: N/A;  ? POLYPECTOMY N/A 11/11/2019  ? Procedure: POLYPECTOMY(ENDOMETRIAL POLYP);  Surgeon: [name], MD;  Location: [facility];  Service: Gynecology;  Laterality: N/A;  ? ROBOTIC ASSISTED LAPAROSCOPIC HYSTERECTOMY AND SALPINGECTOMY Bilateral 12/09/2019  ? Procedure: XI ROBOTIC ASSISTED LAPAROSCOPIC HYSTERECTOMY BILATERAL SALPINGOOOPHORECTOMY;  Surgeon: [name], MD;  Location: [facility];  Service: Gynecology;  Laterality: Bilateral;  ? SENTINEL NODE BIOPSY N/A 12/09/2019  ? Procedure: SENTINEL LYMPH  NODE BIOPSY;  Surgeon: [name], MD;  Location: [facility];  Service: Gynecology;  Laterality: N/A;  ? TEE WITHOUT CARDIOVERSION N/A 03/04/2021  ? Procedure: TRANSESOPHAGEAL ECHOCARDIOGRAM (TEE);  Surgeon: [name], MD;  Location: [facility];  Service: Endoscopy;  Laterality: N/A;  ? TONSILLECTOMY    ? TUBAL LIGATION    ? Bilateral  ? ? ? ?SOCIAL HISTORY:  ?Social History  ? ?Socioeconomic History  ? Marital status: Widowed  ?  Spouse name: widowed  ? Number of children: 3  ? Years of education: Not on file  ? Highest education level: Not on file  ?Occupational History  ? Occupation: [employer]  ?Tobacco Use  ? Smoking status: Never  ? Smokeless tobacco: Never  ?Vaping Use  ? Vaping Use: Never used  ?Substance and Sexual Activity  ? Alcohol use: No  ? Drug use: No  ? Sexual activity: Not Currently  ?  Birth control/protection: Post-menopausal  ?Other Topics Concern  ? Not on file  ?Social History Narrative  ? Married and resides in [place]. 3 children. 3 grandchildren.  ? Sedentary lifestyle  ? ?Social Determinants of Health  ? ?Financial Resource Strain: Low Risk   ? Difficulty of Paying Living Expenses: Not hard at all  ?Food Insecurity: No Food Insecurity  ? Worried About Charity fundraiser in the Last Year: Never true  ? Ran Out of Food in the Last  Year: Never true  ?Transportation Needs: No Transportation Needs  ? Lack of Transportation (Medical): No  ? Lack of Transportation (Non-Medical): No  ?Physical Activity: Sufficiently Active  ? Days of Exercise per Week: 5 days  ? Minutes of Exercise per Session: 30 min  ?Stress: No Stress Concern Present  ? Feeling of Stress : Not at all  ?Social Connections: Moderately Isolated  ? Frequency of Communication with Friends and Family: More than three times a week  ? Frequency of Social Gatherings with Friends and Family: More than three times a week  ? Attends Religious Services: 1 to 4 times per year  ? Active Member of Clubs or Organizations: No  ? Attends Archivist Meetings: Never  ? Marital Status: Widowed  ?Intimate Partner Violence: Not At Risk  ? Fear of Current or Ex-Partner: No  ? Emotionally Abused: No  ? Physically Abused: No  ? Sexually Abused: No  ? ? ?FAMILY HISTORY:  ?Family History  ?  Problem Relation Age of Onset  ? Cancer Mother   ?     female  ? Heart failure Father   ? Stroke Sister   ? Cancer Sister   ?     breast  ? Coronary artery disease Neg Hx   ? ? ?CURRENT MEDICATIONS:  ?Outpatient Encounter Medications as of 02/13/2022  ?Medication Sig Note  ? amiodarone (PACERONE) 200 MG tablet Take 0.5 tablets (100 mg total) by mouth daily.   ? apixaban (ELIQUIS) 5 MG TABS tablet Take 1 tablet (5 mg total) by mouth 2 (two) times daily.   ? atorvastatin (LIPITOR) 20 MG tablet Take 1 tablet (20 mg total) by mouth at bedtime.   ? fenofibrate 160 MG tablet Take 1 tablet (160 mg total) by mouth daily.   ? hydroxyurea (HYDREA) 500 MG capsule TAKE 1 CAPSULE BY MOUTH TWICE A DAY   ? ketoconazole (NIZORAL) 2 % cream Apply 1 application topically 2 (two) times daily.   ? levothyroxine (SYNTHROID) 50 MCG tablet Take 1 tablet (50 mcg total) by mouth daily.   ? metoprolol succinate (TOPROL-XL) 100 MG 24 hr tablet TAKE 1 TABLET (100 MG TOTAL) 2 (TWO) TIMES DAILY. TAKE WITH OR IMMEDIATELY FOLLOWING A MEAL.   ?  Multiple Vitamins-Minerals (MULTIVITAMIN WOMEN 50+) TABS Take by mouth daily.   ? naproxen sodium (ALEVE) 220 MG tablet Take 220 mg by mouth as needed.   ? omeprazole (PRILOSEC) 20 MG capsule Take 1 capsul

## 2022-02-13 ENCOUNTER — Inpatient Hospital Stay (HOSPITAL_COMMUNITY): Payer: Medicare Other | Attending: Hematology | Admitting: Physician Assistant

## 2022-02-13 VITALS — BP 141/64 | HR 67 | Temp 98.4°F | Resp 18 | Ht 64.57 in | Wt 173.1 lb

## 2022-02-13 DIAGNOSIS — C541 Malignant neoplasm of endometrium: Secondary | ICD-10-CM | POA: Diagnosis not present

## 2022-02-13 DIAGNOSIS — D509 Iron deficiency anemia, unspecified: Secondary | ICD-10-CM | POA: Diagnosis not present

## 2022-02-13 DIAGNOSIS — D75839 Thrombocytosis, unspecified: Secondary | ICD-10-CM

## 2022-02-13 DIAGNOSIS — E611 Iron deficiency: Secondary | ICD-10-CM

## 2022-02-13 DIAGNOSIS — Z79899 Other long term (current) drug therapy: Secondary | ICD-10-CM | POA: Insufficient documentation

## 2022-02-13 DIAGNOSIS — E538 Deficiency of other specified B group vitamins: Secondary | ICD-10-CM | POA: Diagnosis not present

## 2022-02-13 DIAGNOSIS — D649 Anemia, unspecified: Secondary | ICD-10-CM | POA: Diagnosis not present

## 2022-02-13 DIAGNOSIS — D473 Essential (hemorrhagic) thrombocythemia: Secondary | ICD-10-CM | POA: Diagnosis not present

## 2022-02-13 NOTE — Patient Instructions (Addendum)
[facility] ?Discharge Instructions ? ?You were seen today by Tarri Abernethy PA-C for your elevated platelets and mild anemia. ? ?Your platelet levels look great!  Continue hydroxyurea (Hydrea) to 500 mg (1 tablet) once per day.  We will recheck your levels again in 4 months. ? ?LABS: Return in 4 months for repeat labs ? ?MEDICATIONS: ?- Continue Hydrea to 500 mg (1 tablet) once daily ?- Continue ferrous sulfate (iron supplement) 325 mg daily.   ?- Continue vitamin B12 (cyanocobalamin) 500 mcg (0.5 mg) daily. ? ?FOLLOW-UP APPOINTMENT: Office visit in 4 months, with same-day labs ? ? ?Thank you for choosing [place] at [facility] to provide your oncology and hematology care.  To afford each patient quality time with our provider, please arrive at least 15 minutes before your scheduled appointment time.  ? ?If you have a lab appointment with the [facility] please come in thru the Main Entrance and check in at the main information desk. ? ?You need to re-schedule your appointment should you arrive 10 or more minutes late.  We strive to give you quality time with our providers, and arriving late affects you and other patients whose appointments are after yours.  Also, if you no show three or more times for appointments you may be dismissed from the clinic at the providers discretion.     ?Again, thank you for choosing [facility].  Our hope is that these requests will decrease the amount of time that you wait before being seen by our physicians.       ?_____________________________________________________________ ? ?Should you have questions after your visit to [facility], please contact our office at [phone] and follow the prompts.  Our office hours are 8:00 a.m. and 4:30 p.m. Monday - Friday.  Please note that voicemails left after 4:00 p.m. may not be returned until the following business day.  We are closed  weekends and major holidays.  You do have access to a nurse 24-7, just call the main number to the clinic [phone] and do not press any options, hold on the line and a nurse will answer the phone.   ? ?For prescription refill requests, have your pharmacy contact our office and allow 72 hours.   ? ?Due to Covid, you will need to wear a mask upon entering the hospital. If you do not have a mask, a mask will be given to you at the Main Entrance upon arrival. For doctor visits, patients may have 1 support person age 13 or older with them. For treatment visits, patients can not have anyone with them due to social distancing guidelines and our immunocompromised population.  ? ? ? ?

## 2022-02-14 ENCOUNTER — Ambulatory Visit (INDEPENDENT_AMBULATORY_CARE_PROVIDER_SITE_OTHER): Payer: Medicare Other | Admitting: Pharmacist

## 2022-02-14 DIAGNOSIS — E782 Mixed hyperlipidemia: Secondary | ICD-10-CM

## 2022-02-14 DIAGNOSIS — I4819 Other persistent atrial fibrillation: Secondary | ICD-10-CM

## 2022-02-14 NOTE — Patient Instructions (Signed)
Visit Information ? ?Following are the goals we discussed today:  ?Current Barriers:  ?Unable to independently afford treatment regimen ? ?Pharmacist Clinical Goal(s):  ?patient will verbalize ability to afford treatment regimen through collaboration with PharmD and provider.  ? ?Interventions: ?1:1 collaboration with Dettinger, Fransisca Kaufmann, MD regarding development and update of comprehensive plan of care as evidenced by provider attestation and co-signature ?Inter-disciplinary care team collaboration (see longitudinal plan of care) ?Comprehensive medication review performed; medication list updated in electronic medical record ? ?Atrial Fibrillation: ?controlled; current rate/rhythm control: amiodarone, metoprolol; anticoagulant treatment: ELIQUIS ?CHADS2VASc score: at least 4 ?Home blood pressure, heart rate readings: controlled ?Educated on taking BP at home, aware of symptoms; denies signs & symptoms of bleeding ?Assessed patient finances. Will enroll in healthwell grant in may when due (appt for 06/01/14); application approved for eliquis PAP via [employer]MS patient assistance until 10/29/22 ? ? ?Patient Goals/Self-Care Activities ?patient will:  ?- take medications as prescribed as evidenced by patient report and record review ?collaborate with provider on medication access solutions ?target a minimum of 150 minutes of moderate intensity exercise weekly ?engage in dietary modifications by FOLLOWING A HEART HEALTHY DIET/HEALTHY PLATE METHOD ? ? ? ?Plan: Telephone follow up appointment with care management team member scheduled for:  02/28/22 ? ?Signature ?Regina Eck, PharmD, BCPS ?Clinical Pharmacist, [facility] ?[place]  II Phone [phone] ? ? ?Please call the care guide team at [phone] if you need to cancel or reschedule your appointment.  ? ?The patient verbalized understanding of instructions, educational materials, and care plan provided today and declined offer to receive  copy of patient instructions, educational materials, and care plan.  ? ?

## 2022-02-14 NOTE — Progress Notes (Signed)
Received notification from [facility] ([place]) regarding approval for [employer]. Patient assistance approved UNTIL 10/29/22. ? ?PT REC'D FIRST SHIPMENT ? ?Phone: [phone] ? ?

## 2022-02-14 NOTE — Progress Notes (Signed)
? ? ?Chronic Care Management ?Pharmacy Note ? ?02/14/2022 ?Name:  Heather Barber MRN:  [id number] DOB:  11-06-1945 ? ?Summary: ? ?Atrial Fibrillation: ?controlled; current rate/rhythm control: amiodarone, metoprolol; anticoagulant treatment: ELIQUIS ?CHADS2VASc score: at least 4 ?Home blood pressure, heart rate readings: controlled ?Educated on taking BP at home, aware of symptoms; denies signs & symptoms of bleeding ?Assessed patient finances. Will enroll in healthwell grant in may when due (appt for 76/4/76); application approved for eliquis PAP via [employer] patient assistance until 10/29/22 ? ?Subjective: ?Heather Barber is an 76 y.o. year old female who is a primary patient of Dettinger, Fransisca Kaufmann, MD.  The CCM team was consulted for assistance with disease management and care coordination needs.   ? ?Engaged with patient by telephone for follow up visit in response to provider referral for pharmacy case management and/or care coordination services.  ? ?Consent to Services:  ?The patient was given information about Chronic Care Management services, agreed to services, and gave verbal consent prior to initiation of services.  Please see initial visit note for detailed documentation.  ? ?Patient Care Team: ?Dettinger, Fransisca Kaufmann, MD as PCP - General (Family Medicine) ?[name], MD as PCP - Cardiology (Cardiology) ?Felicie Morn, MD (Inactive) (General Surgery) ?Rourk, Cristopher Estimable, MD as Consulting Physician (Gastroenterology) ?Lavera Guise, [facility] as Pharmacist (Family Medicine) ? ?Objective: ? ?Lab Results  ?Component Value Date  ? CREATININE 0.85 01/17/2022  ? CREATININE 1.02 (H) 12/26/2021  ? CREATININE 0.86 09/02/2021  ? ? ?Lab Results  ?Component Value Date  ? HGBA1C 6.3 (H) 12/26/2021  ? ?Last diabetic Eye exam: No results found for: HMDIABEYEEXA  ?Last diabetic Foot exam: No results found for: HMDIABFOOTEX  ? ?   ?Component Value Date/Time  ? CHOL 194 09/21/2021 1531  ? TRIG 311 (H) 09/21/2021 1531  ? HDL  46 09/21/2021 1531  ? CHOLHDL 4.2 09/21/2021 1531  ? CHOLHDL 6.6 12/08/2017 0202  ? VLDL 73 (H) 12/08/2017 0202  ? [place] 96 09/21/2021 1531  ? ? ? ?  Latest Ref Rng & Units 01/17/2022  ?  9:13 AM 12/26/2021  ?  4:07 PM 09/02/2021  ? 11:35 AM  ?Hepatic Function  ?Total Protein 6.5 - 8.1 g/dL 6.9   6.5   7.0    ?Albumin 3.5 - 5.0 g/dL 4.2   4.5   4.2    ?AST 15 - 41 U/L '19   19   17    ' ?ALT 0 - 44 U/L '19   18   16    ' ?Alk Phosphatase 38 - 126 U/L 57   49   45    ?Total Bilirubin 0.3 - 1.2 mg/dL 0.4   <0.2   0.6    ? ? ?Lab Results  ?Component Value Date/Time  ? TSH 28.800 (H) 12/26/2021 04:07 PM  ? TSH 15.652 (H) 09/02/2021 11:35 AM  ? TSH 3.423 02/28/2021 12:13 PM  ? TSH 1.565 *Test methodology is 3rd generation TSH 12/24/2009 12:12 PM  ? ? ? ?  Latest Ref Rng & Units 01/17/2022  ?  9:13 AM 09/02/2021  ? 11:35 AM 06/06/2021  ?  9:14 AM  ?CBC  ?WBC 4.0 - 10.5 K/uL 4.6   4.3   3.8    ?Hemoglobin 12.0 - 15.0 g/dL 12.4   11.1   11.1    ?Hematocrit 36.0 - 46.0 % 36.3   31.9   32.4    ?Platelets 150 - 400 K/uL 305   247  260    ? ? ?No results found for: VD25OH ? ?Clinical ASCVD: No  ?The 10-year ASCVD risk score (Arnett DK, et al., 2019) is: 42.5% ?  Values used to calculate the score: ?    Age: 76 years ?    Sex: Female ?    Is Non-Hispanic African American: No ?    Diabetic: Yes ?    Tobacco smoker: No ?    Systolic Blood Pressure: 103 mmHg ?    Is BP treated: Yes ?    HDL Cholesterol: 46 mg/dL ?    Total Cholesterol: 194 mg/dL   ? ?Other: (CHADS2VASc if Afib, PHQ9 if depression, MMRC or CAT for COPD, ACT, DEXA) ? ?Social History  ? ?Tobacco Use  ?Smoking Status Never  ?Smokeless Tobacco Never  ? ?BP Readings from Last 3 Encounters:  ?02/13/22 (!) 141/64  ?12/26/21 131/61  ?11/23/21 130/70  ? ?Pulse Readings from Last 3 Encounters:  ?02/13/22 67  ?12/26/21 81  ?11/23/21 77  ? ?Wt Readings from Last 3 Encounters:  ?02/13/22 173 lb 1 oz (78.5 kg)  ?12/26/21 173 lb (78.5 kg)  ?11/23/21 174 lb (78.9 kg)  ? ? ?Assessment: Review  of patient past medical history, allergies, medications, health status, including review of consultants reports, laboratory and other test data, was performed as part of comprehensive evaluation and provision of chronic care management services.  ? ?SDOH:  (Social Determinants of Health) assessments and interventions performed:  ? ? ?CCM Care Plan ? ?Allergies  ?Allergen Reactions  ? Codeine Nausea And Vomiting  ? Morphine Other (See Comments)  ?  GI symptoms  ? ? ?Medications Reviewed Today   ? ? Reviewed by Lavera Guise, [facility] (Pharmacist) on 02/14/22 at [place] List Status: <None>  ? ?Medication Order Taking? Sig Documenting Provider Last Dose Status Informant  ?amiodarone (PACERONE) 200 MG tablet [id number] No Take 0.5 tablets (100 mg total) by mouth daily. Erma Heritage, PA-C Taking Active   ?apixaban (ELIQUIS) 5 MG TABS tablet [id number] No Take 1 tablet (5 mg total) by mouth 2 (two) times daily. [name], MD Taking Active   ?atorvastatin (LIPITOR) 20 MG tablet [id number] No Take 1 tablet (20 mg total) by mouth at bedtime. Dettinger, Fransisca Kaufmann, MD Taking Active   ?fenofibrate 160 MG tablet [id number] No Take 1 tablet (160 mg total) by mouth daily. [name], MD Taking Expired 10/26/21 2359   ?hydroxyurea (HYDREA) 500 MG capsule [id number] No TAKE 1 CAPSULE BY MOUTH TWICE A DAY Derek Jack, MD Taking Active   ?ketoconazole (NIZORAL) 2 % cream [id number] No Apply 1 application topically 2 (two) times daily. Dettinger, Fransisca Kaufmann, MD Taking Active   ?levothyroxine (SYNTHROID) 50 MCG tablet [id number] No Take 1 tablet (50 mcg total) by mouth daily. Dettinger, Fransisca Kaufmann, MD Taking Active   ?metoprolol succinate (TOPROL-XL) 100 MG 24 hr tablet [id number] No TAKE 1 TABLET (100 MG TOTAL) 2 (TWO) TIMES DAILY. TAKE WITH OR IMMEDIATELY FOLLOWING A MEAL. [name], MD Taking Active   ?Multiple Vitamins-Minerals (MULTIVITAMIN WOMEN 50+) TABS [id number] No Take by mouth daily. [provider] Taking Active Self  ?naproxen sodium (ALEVE) 220 MG tablet [id number] No Take 220 mg by mouth as needed. [provider] Taking Active Self  ?omeprazole (PRILOSEC) 20 MG capsule [id number] No Take 1 capsule (20 mg total) by mouth daily. Dettinger, Fransisca Kaufmann, MD Taking Active   ?sacubitril-valsartan (ENTRESTO) 24-26 MG [id number] No Take 1 tablet by mouth  2 (two) times daily. Charlie Pitter, PA-C Taking Active   ?         ?Med Note (Skylen Spiering D   Wed Jan 04, 2022 10:22 AM) Nicholes Rough GRANT ?  ? ?  ?  ? ?  ? ? ?Patient Active Problem List  ? Diagnosis Date Noted  ? Elevated TSH 09/21/2021  ? A-fib ([place]) 03/01/2021  ? Hypertriglyceridemia 09/16/2020  ? Endometrial cancer, grade I ([place]) 10/09/2019  ? PMB (postmenopausal bleeding) 10/09/2019  ? Type 2 diabetes mellitus with other specified complication ([place]) 34/94/9447  ? SVT (supraventricular tachycardia) ([place]) 05/21/2019  ? Diet-controlled diabetes mellitus ([place]) 06/10/2018  ? Pseudophakia 03/13/2017  ? Gastroesophageal reflux disease 07/24/2016  ? Thrombocytosis 06/06/2016  ? LBBB (left bundle branch block)   ? Ventricular tachycardia ([place])   ? ASD (atrial septal defect)   ? Hypertension   ? Hyperlipidemia 04/27/2010  ? ? ?Immunization History  ?Administered Date(s) Administered  ? Fluad Quad(high Dose 65+) 07/18/2019, 09/16/2020, 09/21/2021  ? Influenza, High Dose Seasonal PF 08/04/2016, 09/10/2017, 08/14/2018  ? Influenza,trivalent, recombinat, inj, PF 08/13/2014, 08/02/2015  ? Moderna Sars-Covid-2 Vaccination 12/25/2019, 01/23/2020, 09/11/2020  ? Pneumococcal Conjugate-13 11/25/2015  ? Pneumococcal Polysaccharide-23 11/30/2016  ? ? ?Conditions to be addressed/monitored: ?HLD and DMII ? ?Care Plan : PHARMD MED MANAGEMENT  ?Updates made by Lavera Guise, RPH since 02/14/2022 12:00 AM  ?  ? ?Problem: DISEASE PROGRESSION PREVENTION   ?  ? ?Long-Range Goal: AFIB/CHF   ?Note:   ?Current Barriers:  ?Unable to independently afford treatment  regimen ? ?Pharmacist Clinical Goal(s):  ?patient will verbalize ability to afford treatment regimen through collaboration with PharmD and provider.  ? ? ?Interventions: ?1:1 collaboration with Dettinger, Fransisca Kaufmann

## 2022-02-26 DIAGNOSIS — I4819 Other persistent atrial fibrillation: Secondary | ICD-10-CM

## 2022-02-26 DIAGNOSIS — E782 Mixed hyperlipidemia: Secondary | ICD-10-CM

## 2022-02-28 ENCOUNTER — Telehealth: Payer: Self-pay | Admitting: Family Medicine

## 2022-02-28 ENCOUNTER — Ambulatory Visit (INDEPENDENT_AMBULATORY_CARE_PROVIDER_SITE_OTHER): Payer: Medicare Other | Admitting: Pharmacist

## 2022-02-28 DIAGNOSIS — I4819 Other persistent atrial fibrillation: Secondary | ICD-10-CM

## 2022-02-28 DIAGNOSIS — E782 Mixed hyperlipidemia: Secondary | ICD-10-CM

## 2022-02-28 NOTE — Patient Instructions (Signed)
Visit Information ? ?Following are the goals we discussed today:  ?Current Barriers:  ?Unable to independently afford treatment regimen ? ?Pharmacist Clinical Goal(s):  ?patient will verbalize ability to afford treatment regimen through collaboration with PharmD and provider.  ? ? ?Interventions: ?1:1 collaboration with Dettinger, Fransisca Kaufmann, MD regarding development and update of comprehensive plan of care as evidenced by provider attestation and co-signature ?Inter-disciplinary care team collaboration (see longitudinal plan of care) ?Comprehensive medication review performed; medication list updated in electronic medical record ? ?PMH per cards notes: ASD (s/p repair in 1994), thrombocytosis, HTN, HLD, valvular heart disease and recently diagnosed atrial fibrillation with associated cardiomyopathy (diagnosed in 02/2021 with echo showing EF of 30-35%) ? ?Atrial Fibrillation/ CHF LVEF EF30-35%: ?Controlled; current rate/rhythm control: amiodarone, metoprolol; anticoagulant treatment: ELIQUIS ?CHADS2VASc score: at least 4 ?Home blood pressure, heart rate readings: controlled ?Educated on taking BP at home, aware of symptoms; denies signs & symptoms of bleeding ?Assessed patient finances. RE-ENROLLED IN healthwell grant today 02/28/22 for Delene Loll (card will be mailed to patient and she will take card to pharmacy and pick up entresto); application approved for eliquis PAP via [employer] patient assistance until 10/29/22 (will ship to patient's home) ? ?Hyperlipidemia ?-LDL<70 with T2DM--sees cardiology ?-Continue atorvastatin and fenofibrate (denies side effects) ? Patient to try diet/lifestyle, but we are likely going to increase atorvastatin ?-recommend-->FOLLOWING A HEART HEALTHY DIET/HEALTHY PLATE METHOD ?Lipid Panel  ?   ?Component Value Date/Time  ? CHOL 194 09/21/2021 1531  ? TRIG 311 (H) 09/21/2021 1531  ? HDL 46 09/21/2021 1531  ? CHOLHDL 4.2 09/21/2021 1531  ? CHOLHDL 6.6 12/08/2017 0202  ? VLDL 73 (H) 12/08/2017 0202   ? [place] 96 09/21/2021 1531  ? LABVLDL 52 (H) 09/21/2021 1531  ? ? ? ?Patient Goals/Self-Care Activities ?patient will:  ?- take medications as prescribed as evidenced by patient report and record review ?collaborate with provider on medication access solutions ?target a minimum of 150 minutes of moderate intensity exercise weekly ?engage in dietary modifications by FOLLOWING A HEART HEALTHY DIET/HEALTHY PLATE METHOD ? ? ? ?Plan: Telephone follow up appointment with care management team member scheduled for:  1 month ? ?Signature ?Regina Eck, PharmD, BCPS ?Clinical Pharmacist, [facility] ?[place]  [place] Phone [phone] ? ? ?Please call the care guide team at [phone] if you need to cancel or reschedule your appointment.  ? ?The patient verbalized understanding of instructions, educational materials, and care plan provided today and declined offer to receive copy of patient instructions, educational materials, and care plan.  ? ?

## 2022-02-28 NOTE — Progress Notes (Signed)
? ? ?Chronic Care Management ?Pharmacy Note ? ?02/28/2022 ?Name:  Heather Barber MRN:  [id number] DOB:  1946-06-24 ? ?Summary: ? ?Atrial Fibrillation/ CHF LVEF EF30-35%: ?Controlled; current rate/rhythm control: amiodarone, metoprolol; anticoagulant treatment: ELIQUIS ?CHADS2VASc score: at least 4 ?Home blood pressure, heart rate readings: controlled ?Educated on taking BP at home, aware of symptoms; denies signs & symptoms of bleeding ?Assessed patient finances. RE-ENROLLED IN healthwell grant today 02/28/22 for Delene Loll (card will be mailed to patient and she will take card to pharmacy and pick up entresto); application approved for eliquis PAP via [employer] patient assistance until 10/29/22 (will ship to patient's home) ? ?Hyperlipidemia ?-LDL<70 with T2DM--sees cardiology ?-Continue atorvastatin and fenofibrate (denies side effects) ? Patient to try diet/lifestyle, but we are likely going to increase atorvastatin ?-recommend-->FOLLOWING A HEART HEALTHY DIET/HEALTHY PLATE METHOD ?Lipid Panel  ?   ?Component Value Date/Time  ? CHOL 194 09/21/2021 1531  ? TRIG 311 (H) 09/21/2021 1531  ? HDL 46 09/21/2021 1531  ? CHOLHDL 4.2 09/21/2021 1531  ? CHOLHDL 6.6 12/08/2017 0202  ? VLDL 73 (H) 12/08/2017 0202  ? [place] 96 09/21/2021 1531  ? LABVLDL 52 (H) 09/21/2021 1531  ? ? ?Subjective: ?[name] is an 76 y.o. year old female who is a primary patient of Dettinger, Fransisca Kaufmann, MD.  The CCM team was consulted for assistance with disease management and care coordination needs.   ? ?Engaged with patient by telephone for follow up visit in response to provider referral for pharmacy case management and/or care coordination services.  ? ?Consent to Services:  ?The patient was given information about Chronic Care Management services, agreed to services, and gave verbal consent prior to initiation of services.  Please see initial visit note for detailed documentation.  ? ?Patient Care Team: ?Dettinger, Fransisca Kaufmann, MD as PCP - General  (Family Medicine) ?[name], MD as PCP - Cardiology (Cardiology) ?Felicie Morn, MD (Inactive) (General Surgery) ?Rourk, Cristopher Estimable, MD as Consulting Physician (Gastroenterology) ?Lavera Guise, [facility] as Pharmacist (Family Medicine) ? ?Objective: ? ?Lab Results  ?Component Value Date  ? CREATININE 0.85 01/17/2022  ? CREATININE 1.02 (H) 12/26/2021  ? CREATININE 0.86 09/02/2021  ? ? ?Lab Results  ?Component Value Date  ? HGBA1C 6.3 (H) 12/26/2021  ? ?Last diabetic Eye exam: No results found for: HMDIABEYEEXA  ?Last diabetic Foot exam: No results found for: HMDIABFOOTEX  ? ?   ?Component Value Date/Time  ? CHOL 194 09/21/2021 1531  ? TRIG 311 (H) 09/21/2021 1531  ? HDL 46 09/21/2021 1531  ? CHOLHDL 4.2 09/21/2021 1531  ? CHOLHDL 6.6 12/08/2017 0202  ? VLDL 73 (H) 12/08/2017 0202  ? [place] 96 09/21/2021 1531  ? ? ? ?  Latest Ref Rng & Units 01/17/2022  ?  9:13 AM 12/26/2021  ?  4:07 PM 09/02/2021  ? 11:35 AM  ?Hepatic Function  ?Total Protein 6.5 - 8.1 g/dL 6.9   6.5   7.0    ?Albumin 3.5 - 5.0 g/dL 4.2   4.5   4.2    ?AST 15 - 41 U/L '19   19   17    ' ?ALT 0 - 44 U/L '19   18   16    ' ?Alk Phosphatase 38 - 126 U/L 57   49   45    ?Total Bilirubin 0.3 - 1.2 mg/dL 0.4   <0.2   0.6    ? ? ?Lab Results  ?Component Value Date/Time  ? TSH 28.800 (H)  12/26/2021 04:07 PM  ? TSH 15.652 (H) 09/02/2021 11:35 AM  ? TSH 3.423 02/28/2021 12:13 PM  ? TSH 1.565 *Test methodology is 3rd generation TSH 12/24/2009 12:12 PM  ? ? ? ?  Latest Ref Rng & Units 01/17/2022  ?  9:13 AM 09/02/2021  ? 11:35 AM 06/06/2021  ?  9:14 AM  ?CBC  ?WBC 4.0 - 10.5 K/uL 4.6   4.3   3.8    ?Hemoglobin 12.0 - 15.0 g/dL 12.4   11.1   11.1    ?Hematocrit 36.0 - 46.0 % 36.3   31.9   32.4    ?Platelets 150 - 400 K/uL 305   247   260    ? ? ?No results found for: VD25OH ? ?Clinical ASCVD: Yes  ?The 10-year ASCVD risk score (Arnett DK, et al., 2019) is: 42.5% ?  Values used to calculate the score: ?    Age: 42 years ?    Sex: Female ?    Is Non-Hispanic African  American: No ?    Diabetic: Yes ?    Tobacco smoker: No ?    Systolic Blood Pressure: 224 mmHg ?    Is BP treated: Yes ?    HDL Cholesterol: 46 mg/dL ?    Total Cholesterol: 194 mg/dL   ? ?Other: (CHADS2VASc if Afib, PHQ9 if depression, MMRC or CAT for COPD, ACT, DEXA) ? ?Social History  ? ?Tobacco Use  ?Smoking Status Never  ?Smokeless Tobacco Never  ? ?BP Readings from Last 3 Encounters:  ?02/13/22 (!) 141/64  ?12/26/21 131/61  ?11/23/21 130/70  ? ?Pulse Readings from Last 3 Encounters:  ?02/13/22 67  ?12/26/21 81  ?11/23/21 77  ? ?Wt Readings from Last 3 Encounters:  ?02/13/22 173 lb 1 oz (78.5 kg)  ?12/26/21 173 lb (78.5 kg)  ?11/23/21 174 lb (78.9 kg)  ? ? ?Assessment: Review of patient past medical history, allergies, medications, health status, including review of consultants reports, laboratory and other test data, was performed as part of comprehensive evaluation and provision of chronic care management services.  ? ?SDOH:  (Social Determinants of Health) assessments and interventions performed:  ? ? ?CCM Care Plan ? ?Allergies  ?Allergen Reactions  ? Codeine Nausea And Vomiting  ? Morphine Other (See Comments)  ?  GI symptoms  ? ? ?Medications Reviewed Today   ? ? Reviewed by Lavera Guise, [facility] (Pharmacist) on 02/28/22 at 1146  Med List Status: <None>  ? ?Medication Order Taking? Sig Documenting Provider Last Dose Status Informant  ?amiodarone (PACERONE) 200 MG tablet [id number] No Take 0.5 tablets (100 mg total) by mouth daily. Erma Heritage, PA-C Taking Active   ?apixaban (ELIQUIS) 5 MG TABS tablet [id number] No Take 1 tablet (5 mg total) by mouth 2 (two) times daily. [name], MD Taking Active   ?atorvastatin (LIPITOR) 20 MG tablet [id number] No Take 1 tablet (20 mg total) by mouth at bedtime. Dettinger, Fransisca Kaufmann, MD Taking Active   ?fenofibrate 160 MG tablet [id number] No Take 1 tablet (160 mg total) by mouth daily. [name], MD Taking Expired 10/26/21 2359   ?hydroxyurea  (HYDREA) 500 MG capsule [id number] No TAKE 1 CAPSULE BY MOUTH TWICE A DAY Derek Jack, MD Taking Active   ?ketoconazole (NIZORAL) 2 % cream [id number] No Apply 1 application topically 2 (two) times daily. Dettinger, Fransisca Kaufmann, MD Taking Active   ?levothyroxine (SYNTHROID) 50 MCG tablet [id number] No Take 1 tablet (50 mcg total) by mouth daily. Dettinger,  Fransisca Kaufmann, MD Taking Active   ?metoprolol succinate (TOPROL-XL) 100 MG 24 hr tablet [id number] No TAKE 1 TABLET (100 MG TOTAL) 2 (TWO) TIMES DAILY. TAKE WITH OR IMMEDIATELY FOLLOWING A MEAL. [name], MD Taking Active   ?Multiple Vitamins-Minerals (MULTIVITAMIN WOMEN 50+) TABS [id number] No Take by mouth daily. [provider] Taking Active Self  ?naproxen sodium (ALEVE) 220 MG tablet [id number] No Take 220 mg by mouth as needed. [provider] Taking Active Self  ?omeprazole (PRILOSEC) 20 MG capsule [id number] No Take 1 capsule (20 mg total) by mouth daily. Dettinger, Fransisca Kaufmann, MD Taking Active   ?sacubitril-valsartan (ENTRESTO) 24-26 MG [id number] No Take 1 tablet by mouth 2 (two) times daily. Charlie Pitter, PA-C Taking Active   ?         ?Med Note (Norris Brumbach D   Wed Jan 04, 2022 10:22 AM) Nicholes Rough GRANT ?  ? ?  ?  ? ?  ? ? ?Patient Active Problem List  ? Diagnosis Date Noted  ? Elevated TSH 09/21/2021  ? A-fib ([place]) 03/01/2021  ? Hypertriglyceridemia 09/16/2020  ? Endometrial cancer, grade I ([place]) 10/09/2019  ? PMB (postmenopausal bleeding) 10/09/2019  ? Type 2 diabetes mellitus with other specified complication ([place]) 49/82/6415  ? SVT (supraventricular tachycardia) ([place]) 05/21/2019  ? Diet-controlled diabetes mellitus ([place]) 06/10/2018  ? Pseudophakia 03/13/2017  ? Gastroesophageal reflux disease 07/24/2016  ? Thrombocytosis 06/06/2016  ? LBBB (left bundle branch block)   ? Ventricular tachycardia ([place])   ? ASD (atrial septal defect)   ? Hypertension   ? Hyperlipidemia 04/27/2010  ? ? ?Immunization History  ?Administered Date(s)  Administered  ? Fluad Quad(high Dose 65+) 07/18/2019, 09/16/2020, 09/21/2021  ? Influenza, High Dose Seasonal PF 08/04/2016, 09/10/2017, 08/14/2018  ? Influenza,trivalent, recombinat, inj, PF 08/13/2014, 08/02/19

## 2022-03-01 ENCOUNTER — Ambulatory Visit: Payer: Medicare Other | Admitting: Student

## 2022-03-15 ENCOUNTER — Encounter: Payer: Self-pay | Admitting: Adult Health

## 2022-03-15 ENCOUNTER — Ambulatory Visit (INDEPENDENT_AMBULATORY_CARE_PROVIDER_SITE_OTHER): Payer: Medicare Other | Admitting: Adult Health

## 2022-03-15 VITALS — BP 134/67 | HR 76 | Ht 63.0 in | Wt 174.0 lb

## 2022-03-15 DIAGNOSIS — C541 Malignant neoplasm of endometrium: Secondary | ICD-10-CM | POA: Diagnosis not present

## 2022-03-15 NOTE — Progress Notes (Signed)
?Subjective:  ?  ? [name], female   DOB: [birth year], 76 y.o.   MRN: [id number] ? ?HPI ?Heather Barber is a 76 year old white female, widowed, with a history of stage IA grade 1 endometrioid endometrial adenocarcinoma, s/p robotic staging on 12/09/19, she is in today for pelvic exam.  She gets pelvic exams every 6 months for 5 years. ? ?PCP is Dr Building control surveyor. ? ?Review of Systems ?Patient denies any headaches, hearing loss, fatigue, blurred vision, shortness of breath, chest pain, abdominal pain, problems with bowel movements, urination, or intercourse(not active). No  mood swings.  ?  She has pain in right hip and had xray with PCP ?She denies any vaginal bleeding. ?Reviewed past medical,surgical, social and family history. Reviewed medications and allergies.  ?Objective:  ? Physical Exam ?BP 134/67 (BP Location: Left Arm, Patient Position: Sitting, Cuff Size: Normal)   Pulse 76   Ht '5\' 3"'$  (1.6 m)   Wt 174 lb (78.9 kg)   BMI 30.82 kg/m?   ?  Skin warm and dry. Neck: mid line trachea, normal thyroid, good ROM, no lymphadenopathy noted. Lungs: clear to ausculation bilaterally. Cardiovascular: regular rate and rhythm.  ?Pelvic: external genitalia is normal in appearance no lesions, vagina: pale with loss of rugae, no lesions seen,urethra has no lesions or masses noted, cervix and uterus are absent, adnexa: no masses or tenderness noted. Bladder is non tender and no masses felt.    ?Rectal was deferred. ?AA is 0 ?Fall risk is low ? ?  03/15/2022  ?  1:35 PM 12/26/2021  ?  3:36 PM 11/23/2021  ?  9:17 AM  ?Depression screen PHQ 2/9  ?Decreased Interest 0 0 0  ?Down, Depressed, Hopeless 0 0 0  ?PHQ - 2 Score 0 0 0  ?Altered sleeping 0 0   ?Tired, decreased energy 0 0   ?Change in appetite 0 0   ?Feeling bad or failure about yourself  0 0   ?Trouble concentrating 0 0   ?Moving slowly or fidgety/restless 0 0   ?Suicidal thoughts 0 0   ?PHQ-9 Score 0 0   ?  ? ?  03/15/2022  ?  1:35 PM 12/26/2021  ?  3:36 PM 10/17/2021  ?   9:37 AM  ?GAD 7 : Generalized Anxiety Score  ?Nervous, Anxious, on Edge 0 0 0  ?Control/stop worrying 0 0 0  ?Worry too much - different things 0 0 0  ?Trouble relaxing 0 0 0  ?Restless 0 0 0  ?Easily annoyed or irritable 0 0 0  ?Afraid - awful might happen 0 0 0  ?Total GAD 7 Score 0 0 0  ?Anxiety Difficulty   Not difficult at all  ? ?  ? [place] - 03/15/22 1333   ? ?  ? Pregnancy Intention Screening  ? Does the patient want to become pregnant in the next year? N/A   ? Does the patient's partner want to become pregnant in the next year? N/A   ? Would the patient like to discuss contraceptive options today? N/A   ?  ? Contraception Wrap Up  ? Current Method Female Sterilization   hyst  ? End Method Female Sterilization   hyst  ? Contraception Counseling Provided No   ? ?  ?  ? ?  ? Examination chaperoned by Marcelino Scot RN ? ?Assessment:  ?   ?1. Endometrial cancer, grade I ([place]) ?No lesions seen ?She denies any bleeding ?Pelvic exam in 6 months  ?   ?  Plan:  ?   ?Return in 6 months for pelvic exam  ?   ?

## 2022-03-17 ENCOUNTER — Encounter: Payer: Self-pay | Admitting: Student

## 2022-03-17 ENCOUNTER — Ambulatory Visit (INDEPENDENT_AMBULATORY_CARE_PROVIDER_SITE_OTHER): Payer: Medicare Other | Admitting: Student

## 2022-03-17 VITALS — BP 128/80 | HR 71 | Ht 63.0 in | Wt 173.0 lb

## 2022-03-17 DIAGNOSIS — Q211 Atrial septal defect, unspecified: Secondary | ICD-10-CM

## 2022-03-17 DIAGNOSIS — I4892 Unspecified atrial flutter: Secondary | ICD-10-CM

## 2022-03-17 DIAGNOSIS — Z8679 Personal history of other diseases of the circulatory system: Secondary | ICD-10-CM | POA: Diagnosis not present

## 2022-03-17 DIAGNOSIS — I340 Nonrheumatic mitral (valve) insufficiency: Secondary | ICD-10-CM

## 2022-03-17 DIAGNOSIS — I10 Essential (primary) hypertension: Secondary | ICD-10-CM | POA: Diagnosis not present

## 2022-03-17 NOTE — Patient Instructions (Signed)
Medication Instructions:  Your physician recommends that you continue on your current medications as directed. Please refer to the Current Medication list given to you today.  *If you need a refill on your cardiac medications before your next appointment, please call your pharmacy*   Lab Work: NONE   If you have labs (blood work) drawn today and your tests are completely normal, you will receive your results only by: [place] (if you have MyChart) OR A paper copy in the mail If you have any lab test that is abnormal or we need to change your treatment, we will call you to review the results.   Testing/Procedures: NONE    Follow-Up: At [facility], you and your health needs are our priority.  As part of our continuing mission to provide you with exceptional heart care, we have created designated Provider Care Teams.  These Care Teams include your primary Cardiologist (physician) and Advanced Practice Providers (APPs -  Physician Assistants and Nurse Practitioners) who all work together to provide you with the care you need, when you need it.  We recommend signing up for the patient portal called "MyChart".  Sign up information is provided on this After Visit Summary.  MyChart is used to connect with patients for Virtual Visits (Telemedicine).  Patients are able to view lab/test results, encounter notes, upcoming appointments, etc.  Non-urgent messages can be sent to your provider as well.   To learn more about what you can do with MyChart, go to [email or web address].    Your next appointment:   6 month(s)  The format for your next appointment:   In Person  Provider:   Carlyle Dolly, MD or Bernerd Pho, PA-C    Other Instructions Thank you for choosing [place]!    Important Information About Sugar

## 2022-03-17 NOTE — Progress Notes (Unsigned)
Cardiology Office Note    Date:  03/17/2022   ID:  Heather Barber, DOB 1945-11-13, MRN [id number]  PCP:  Dettinger, Fransisca Kaufmann, MD  Cardiologist: Carlyle Dolly, MD    Chief Complaint  Patient presents with   Follow-up    6 month visit    History of Present Illness:    Heather Barber is a 76 y.o. female with past medical history of palpitations (prior monitor showing SVT and reported history of VT with normal cath in 1994 in 2011), persistent atrial fibrillation (s/p DCCV in 02/2021), ASD (s/p repair in 1994), thrombocytosis, endometrial cancer, HTN, HLD, valvular heart disease and HFrEF (EF 30-35% in 02/2021 in the setting of new-onset atrial fibrillation with RVR but normalized to 55-60% by repeat imaging in 05/2021) who presents to the office today for 70-monthfollow-up.  She was last examined by myself in 06/2021 and reported overall doing well at that time and denied any recent anginal symptoms. She was continued on Amiodarone 200 mg daily along with Toprol-XL 100 mg twice daily and Eliquis 5 mg twice daily for anticoagulation. Recent echocardiogram had shown that her EF had normalized and she was continued on Entresto but it was mentioned to switch to an ARB in the future if EDelene Lollbecame unaffordable.  In talking with the patient today, she reports overall feeling well since her last office visit. [place] [place]. She denies any recent chest pain or progressive dyspnea on exertion. No recent palpitations, orthopnea, PND or pitting edema.  Past Medical History:  Diagnosis Date   (HFpEF) heart failure with preserved ejection fraction ([facility]    a. EF previously 30-35% in 02/2021 in the setting of afib w/RVR --> normalized by repeat imaging in 05/2021.   Anxiety    Atrial flutter ([facility]    a. s/p DCCV in 02/2021   Atrial septal defect    Surgical repair in 1994   Cardiomyopathy ([facility]    Chest pain     Normal coronary angiography in 1994 and 2011   Dizziness    Endometrial cancer, grade I ([place] 10/09/2019   Essential thrombocythemia ([facility] 06/06/2016   GERD (gastroesophageal reflux disease)    History of kidney stones    Hyperlipidemia    Hypertension    LBBB (left bundle branch block) 2011   Mitral regurgitation    Palpitations    Persistent atrial fibrillation ([name])    PONV (postoperative nausea and vomiting)    Syncope    Tricuspid regurgitation    Type 2 diabetes mellitus with other specified complication (H[place] [id number]  Ventricular tachycardia ([facility]    Exercise induced    Past Surgical History:  Procedure Laterality Date   [facility]  BREAST EXCISIONAL BIOPSY     Right and left   BREAST SURGERY     right and left breast-benign   CARDIAC CATHETERIZATION  12/24/2009   Dr. CBurt Knack   CARDIOVERSION N/A 03/04/2021   Procedure: CARDIOVERSION;  Surgeon: BArnoldo Lenis MD;  Location: [facility] ORS;  Service: Endoscopy;  Laterality: N/A;   CATARACT EXTRACTION W/PHACO Left 03/11/2013   Procedure: CATARACT EXTRACTION PHACO AND INTRAOCULAR LENS PLACEMENT (IOC);  Surgeon: MElta GuadeloupeT. SGershon Crane MD;  Location: [facility] ORS;  Service: Ophthalmology;  Laterality: Left;  CDE:  12.20   HYSTEROSCOPY WITH D & C N/A 11/11/2019  Procedure: DILATATION AND CURETTAGE /HYSTEROSCOPY;  Surgeon: [name], MD;  Location: [facility] ORS;  Service: Gynecology;  Laterality: N/A;   POLYPECTOMY N/A 11/11/2019   Procedure: POLYPECTOMY(ENDOMETRIAL POLYP);  Surgeon: [name], MD;  Location: [facility] ORS;  Service: Gynecology;  Laterality: N/A;   ROBOTIC ASSISTED LAPAROSCOPIC HYSTERECTOMY AND SALPINGECTOMY Bilateral 12/09/2019   Procedure: XI ROBOTIC ASSISTED LAPAROSCOPIC HYSTERECTOMY BILATERAL SALPINGOOOPHORECTOMY;  Surgeon: [name], MD;  Location: [facility]S;  Service: Gynecology;  Laterality: Bilateral;   SENTINEL NODE BIOPSY N/A 12/09/2019   Procedure: SENTINEL LYMPH  NODE  BIOPSY;  Surgeon: [name], MD;  Location: [facility];  Service: Gynecology;  Laterality: N/A;   TEE WITHOUT CARDIOVERSION N/A 03/04/2021   Procedure: TRANSESOPHAGEAL ECHOCARDIOGRAM (TEE);  Surgeon: [name], MD;  Location: [facility] ORS;  Service: Endoscopy;  Laterality: N/A;   TONSILLECTOMY     TUBAL LIGATION     Bilateral    Current Medications: Outpatient Medications Prior to Visit  Medication Sig Dispense Refill   amiodarone (PACERONE) 200 MG tablet Take 0.5 tablets (100 mg total) by mouth daily. 45 tablet 3   apixaban (ELIQUIS) 5 MG TABS tablet Take 1 tablet (5 mg total) by mouth 2 (two) times daily. 60 tablet 5   atorvastatin (LIPITOR) 20 MG tablet Take 1 tablet (20 mg total) by mouth at bedtime. 90 tablet 3   fenofibrate 160 MG tablet Take 1 tablet (160 mg total) by mouth daily. 90 tablet 1   hydroxyurea (HYDREA) 500 MG capsule TAKE 1 CAPSULE BY MOUTH TWICE A DAY 180 capsule 1   ketoconazole (NIZORAL) 2 % cream Apply 1 application topically 2 (two) times daily. 60 g 0   levothyroxine (SYNTHROID) 50 MCG tablet Take 1 tablet (50 mcg total) by mouth daily. 90 tablet 1   metoprolol succinate (TOPROL-XL) 100 MG 24 hr tablet TAKE 1 TABLET (100 MG TOTAL) 2 (TWO) TIMES DAILY. TAKE WITH OR IMMEDIATELY FOLLOWING A MEAL. 180 tablet 2   Multiple Vitamins-Minerals (MULTIVITAMIN WOMEN 50+) TABS Take by mouth daily.     naproxen sodium (ALEVE) 220 MG tablet Take 220 mg by mouth as needed.     omeprazole (PRILOSEC) 20 MG capsule Take 1 capsule (20 mg total) by mouth daily. 90 capsule 3   sacubitril-valsartan (ENTRESTO) 24-26 MG Take 1 tablet by mouth 2 (two) times daily. 180 tablet 3   No facility-administered medications prior to visit.     Allergies:   Codeine and Morphine   Social History   Socioeconomic History   Marital status: Widowed    Spouse name: widowed   Number of children: 3   Years of education: Not on file   Highest education level: Not on file  Occupational History    Occupation: [employer]  Tobacco Use   Smoking status: Never   Smokeless tobacco: Never  Vaping Use   Vaping Use: Never used  Substance and Sexual Activity   Alcohol use: No   Drug use: No   Sexual activity: Not Currently    Birth control/protection: Post-menopausal, Surgical    Comment: hyst  Other Topics Concern   Not on file  Social History Narrative   Married and resides in [place]. 3 children. 3 grandchildren.   Sedentary lifestyle   Social Determinants of Radio broadcast assistant Strain: Low Risk    Difficulty of Paying Living Expenses: Not very hard  Food Insecurity: No Food Insecurity   Worried About Running Out of Food in the Last Year: Never true  Ran Out of Food in the Last Year: Never true  Transportation Needs: No Transportation Needs   Lack of Transportation (Medical): No   Lack of Transportation (Non-Medical): No  Physical Activity: Insufficiently Active   Days of Exercise per Week: 1 day   Minutes of Exercise per Session: 100 min  Stress: No Stress Concern Present   Feeling of Stress : Not at all  Social Connections: Moderately Isolated   Frequency of Communication with Friends and Family: More than three times a week   Frequency of Social Gatherings with Friends and Family: Once a week   Attends Religious Services: More than 4 times per year   Active Member of [employer] or Organizations: No   Attends Archivist Meetings: Never   Marital Status: Widowed     Family History:  The patient's family history includes Cancer in her mother and sister; Heart failure in her father; Stroke in her sister.   Review of Systems:    Please see the history of present illness.     All other systems reviewed and are otherwise negative except as noted above.   Physical Exam:    VS:  BP 128/80   Pulse 71   Ht '5\' 3"'$  (1.6 m)   Wt 173 lb (78.5 kg)   SpO2 97%   BMI 30.65 kg/m    General: Well developed, well nourished,female appearing in no acute  distress. Head: Normocephalic, atraumatic. Neck: No carotid bruits. JVD not elevated.  Lungs: Respirations regular and unlabored, without wheezes or rales.  Heart: Regular rate and rhythm. No S3 or S4.  No murmur, no rubs, or gallops appreciated. Abdomen: Appears non-distended. No obvious abdominal masses. Msk:  Strength and tone appear normal for age. No obvious joint deformities or effusions. Extremities: No clubbing or cyanosis. No pitting edema.  Distal pedal pulses are 2+ bilaterally. Neuro: Alert and oriented X 3. Moves all extremities spontaneously. No focal deficits noted. Psych:  Responds to questions appropriately with a normal affect. Skin: No rashes or lesions noted  Wt Readings from Last 3 Encounters:  03/17/22 173 lb (78.5 kg)  03/15/22 174 lb (78.9 kg)  02/13/22 173 lb 1 oz (78.5 kg)    Studies/Labs Reviewed:   EKG:  EKG is ordered today.  The ekg ordered today demonstrates NSR, HR 72 with LBBB.   Recent Labs: 12/26/2021: TSH 28.800 01/17/2022: ALT 19; BUN 22; Creatinine, Ser 0.85; Hemoglobin 12.4; Platelets 305; Potassium 4.0; Sodium 140   Lipid Panel    Component Value Date/Time   CHOL 194 09/21/2021 1531   TRIG 311 (H) 09/21/2021 1531   HDL 46 09/21/2021 1531   CHOLHDL 4.2 09/21/2021 1531   CHOLHDL 6.6 12/08/2017 0202   VLDL 73 (H) 12/08/2017 0202   LDLCALC 96 09/21/2021 1531    Additional studies/ records that were reviewed today include:   Echocardiogram: 05/2021 IMPRESSIONS     1. Left ventricular ejection fraction, by estimation, is 55 to 60%. The  left ventricle has normal function. The left ventricle has no regional  wall motion abnormalities. There is mild left ventricular hypertrophy.  Left ventricular diastolic parameters  were normal.   2. Right ventricular systolic function is normal. The right ventricular  size is normal. There is normal pulmonary artery systolic pressure. The  estimated right ventricular systolic pressure is 68.3 mmHg.    3. Left atrial size was mildly dilated.   4. Right atrial size was moderately dilated.   5. The mitral valve is grossly normal.  Moderate mitral valve  regurgitation.   6. Tricuspid valve regurgitation is moderate.   7. The aortic valve is tricuspid. There is mild calcification of the  aortic valve. Aortic valve regurgitation is not visualized. Mild to  moderate aortic valve sclerosis/calcification is present, without any  evidence of aortic stenosis. Aortic valve mean   gradient measures 9.0 mmHg. Aortic valve Vmax measures 2.01 m/s.   8. The inferior vena cava is normal in size with greater than 50%  respiratory variability, suggesting right atrial pressure of 3 mmHg.   Comparison(s): Prior images reviewed side by side. LVEF has improved  significantly.   Assessment:    1. Paroxysmal atrial flutter (August)   2. History of cardiomegaly   3. Essential hypertension   4. ASD (atrial septal defect)   5. Mitral valve insufficiency, unspecified etiology      Plan:   In order of problems listed above:  1. Paroxysmal Atrial Fibrillation - She underwent DCCV in 02/2021 but had recurrence afterwards with intermittent obstructive paroxysmal atrial fibrillation and was ultimately started on antiarrhythmic therapy with amiodarone.  She denies any recent palpitation and is maintaining normal sinus rhythm by examination and EKG today. TSH was elevated to 28 in 11/2021 which led to dose reduction of Amiodarone to 100 mg daily.  She was also started on Synthroid by her PCP and says she is scheduled for follow-up labs next month.  If her hypothyroidism remains difficult to control, may need to review refer to EP for other considerations of antiarrhythmic therapy.  - Continue Toprol-XL 100 mg twice daily for rate control along with Eliquis 5 mg twice daily for anticoagulation.  2. HFimpEF -Her EF was reduced at 30 to 35% in 02/2021 and felt to be tachycardia mediated in the setting of atrial  fibrillation with RVR.  Follow-up echocardiogram imaging showed that her EF has since normalized.   -She remains active at baseline and denies any recent progressive dyspnea on exertion, orthopnea, PND or pitting edema.  Continue current medical therapy with Toprol-XL 100 mg daily and Entresto 24-26 mg twice daily.  She is currently receiving patient assistance for Entresto but if this is not covered in the future, would switch to losartan.    3. HTN - Her BP is well controlled at 128/80 during today's visit. Continue current medical therapy.  4. ASD - She underwent repair in 1994.  No evidence of residual ASD by recent echocardiogram imaging  5. Valvular Heart Disease  - She did have moderate MR and moderate TR by echocardiogram in 05/2021.  Would anticipate repeat imaging later this year.     Medication Adjustments/Labs and Tests Ordered: Current medicines are reviewed at length with the patient today.  Concerns regarding medicines are outlined above.  Medication changes, Labs and Tests ordered today are listed in the Patient Instructions below. Patient Instructions  Medication Instructions:  Your physician recommends that you continue on your current medications as directed. Please refer to the Current Medication list given to you today.  *If you need a refill on your cardiac medications before your next appointment, please call your pharmacy*   Lab Work: NONE   If you have labs (blood work) drawn today and your tests are completely normal, you will receive your results only by: [place] (if you have MyChart) OR A paper copy in the mail If you have any lab test that is abnormal or we need to change your treatment, we will call you to review the results.  Testing/Procedures: NONE    Follow-Up: At [facility], you and your health needs are our priority.  As part of our continuing mission to provide you with exceptional heart care, we have created designated Provider  Care Teams.  These Care Teams include your primary Cardiologist (physician) and Advanced Practice Providers (APPs -  Physician Assistants and Nurse Practitioners) who all work together to provide you with the care you need, when you need it.  We recommend signing up for the patient portal called "MyChart".  Sign up information is provided on this After Visit Summary.  MyChart is used to connect with patients for Virtual Visits (Telemedicine).  Patients are able to view lab/test results, encounter notes, upcoming appointments, etc.  Non-urgent messages can be sent to your provider as well.   To learn more about what you can do with MyChart, go to [email or web address].    Your next appointment:   6 month(s)  The format for your next appointment:   In Person  Provider:   Carlyle Dolly, MD or Bernerd Pho, PA-C    Other Instructions Thank you for choosing [place]!    Important Information About Sugar         Signed, Erma Heritage, PA-C  03/17/2022 5:11 PM    [facility] [address] [place], [place] [address] Phone: [phone] Fax: [phone]

## 2022-03-29 DIAGNOSIS — E785 Hyperlipidemia, unspecified: Secondary | ICD-10-CM | POA: Diagnosis not present

## 2022-03-29 DIAGNOSIS — I509 Heart failure, unspecified: Secondary | ICD-10-CM | POA: Diagnosis not present

## 2022-03-29 DIAGNOSIS — I4891 Unspecified atrial fibrillation: Secondary | ICD-10-CM | POA: Diagnosis not present

## 2022-03-29 DIAGNOSIS — E782 Mixed hyperlipidemia: Secondary | ICD-10-CM

## 2022-03-29 DIAGNOSIS — I4819 Other persistent atrial fibrillation: Secondary | ICD-10-CM

## 2022-03-30 ENCOUNTER — Encounter: Payer: Self-pay | Admitting: Family Medicine

## 2022-03-30 ENCOUNTER — Ambulatory Visit (INDEPENDENT_AMBULATORY_CARE_PROVIDER_SITE_OTHER): Payer: Medicare Other | Admitting: Family Medicine

## 2022-03-30 VITALS — BP 126/55 | HR 84 | Temp 98.0°F | Ht 63.0 in | Wt 176.0 lb

## 2022-03-30 DIAGNOSIS — M1611 Unilateral primary osteoarthritis, right hip: Secondary | ICD-10-CM

## 2022-03-30 DIAGNOSIS — I10 Essential (primary) hypertension: Secondary | ICD-10-CM

## 2022-03-30 DIAGNOSIS — E782 Mixed hyperlipidemia: Secondary | ICD-10-CM

## 2022-03-30 DIAGNOSIS — E781 Pure hyperglyceridemia: Secondary | ICD-10-CM

## 2022-03-30 DIAGNOSIS — Z23 Encounter for immunization: Secondary | ICD-10-CM | POA: Diagnosis not present

## 2022-03-30 DIAGNOSIS — E1169 Type 2 diabetes mellitus with other specified complication: Secondary | ICD-10-CM | POA: Diagnosis not present

## 2022-03-30 LAB — BAYER DCA HB A1C WAIVED: HB A1C (BAYER DCA - WAIVED): 7.2 % — ABNORMAL HIGH (ref 4.8–5.6)

## 2022-03-30 NOTE — Progress Notes (Signed)
BP (!) 126/55   Pulse 84   Temp 98 F (36.7 C)   Ht '5\' 3"'$  (1.6 m)   Wt 176 lb (79.8 kg)   SpO2 93%   BMI 31.18 kg/m    Subjective:   [name], female    DOB: 08-Mar-1946, 76 y.o.   MRN: [id number]  HPI: Heather Barber is a 76 y.o. female presenting on 03/30/2022 for Medical Management of Chronic Issues and Diabetes   HPI Type 2 diabetes mellitus Patient comes in today for recheck of his diabetes. Patient has been currently taking no medicine currently, has been trying to do diet control, A1c is up slightly at 7.2.. Patient is currently on an ACE inhibitor/ARB. Patient has not seen an ophthalmologist this year. Patient denies any issues with their feet. The symptom started onset as an adult hypertension and hyperlipidemia and hypertriglyceridemia ARE RELATED TO DM   Right hip pain Patient has continued right hip pain both anterior and laterally that is been bothering her.  We tried some anti-inflammatory and muscle rubs and creams that she says is not getting any better.  She has been diagnosed with right hip osteoarthritis and she would like to go to an orthopedic.  Hypertension and A-fib Patient is currently on Entresto and metoprolol and amiodarone, and their blood pressure today is 126/55. Patient denies any lightheadedness or dizziness. Patient denies headaches, blurred vision, chest pains, shortness of breath, or weakness. Denies any side effects from medication and is content with current medication.   Hyperlipidemia and hypertriglyceridemia Patient is coming in for recheck of his hyperlipidemia. The patient is currently taking fenofibrate and atorvastatin. They deny any issues with myalgias or history of liver damage from it. They deny any focal numbness or weakness or chest pain.   Relevant past medical, surgical, family and social history reviewed and updated as indicated. Interim medical history since our last visit reviewed. Allergies and medications  reviewed and updated.  Review of Systems  Constitutional:  Negative for chills and fever.  Eyes:  Negative for visual disturbance.  Respiratory:  Negative for chest tightness and shortness of breath.   Cardiovascular:  Negative for chest pain and leg swelling.  Musculoskeletal:  Positive for arthralgias. Negative for back pain and gait problem.  Skin:  Negative for rash.  Neurological:  Negative for dizziness, light-headedness and headaches.  Psychiatric/Behavioral:  Negative for agitation and behavioral problems.   All other systems reviewed and are negative.  Per HPI unless specifically indicated above   Allergies as of 03/30/2022       Reactions   Codeine Nausea And Vomiting   Morphine Other (See Comments)   GI symptoms        Medication List        Accurate as of March 30, 2022  4:35 PM. If you have any questions, ask your nurse or doctor.          amiodarone 200 MG tablet Commonly known as: Pacerone Take 0.5 tablets (100 mg total) by mouth daily.   apixaban 5 MG Tabs tablet Commonly known as: ELIQUIS Take 1 tablet (5 mg total) by mouth 2 (two) times daily.   atorvastatin 20 MG tablet Commonly known as: LIPITOR Take 1 tablet (20 mg total) by mouth at bedtime.   fenofibrate 160 MG tablet Take 1 tablet (160 mg total) by mouth daily.   hydroxyurea 500 MG capsule Commonly known as: HYDREA TAKE 1 CAPSULE BY MOUTH TWICE A DAY  ketoconazole 2 % cream Commonly known as: NIZORAL Apply 1 application topically 2 (two) times daily.   levothyroxine 50 MCG tablet Commonly known as: SYNTHROID Take 1 tablet (50 mcg total) by mouth daily.   metoprolol succinate 100 MG 24 hr tablet Commonly known as: TOPROL-XL TAKE 1 TABLET (100 MG TOTAL) 2 (TWO) TIMES DAILY. TAKE WITH OR IMMEDIATELY FOLLOWING A MEAL.   Multivitamin Women 50+ Tabs Take by mouth daily.   naproxen sodium 220 MG tablet Commonly known as: ALEVE Take 220 mg by mouth as needed.   omeprazole 20 MG  capsule Commonly known as: PRILOSEC Take 1 capsule (20 mg total) by mouth daily.   sacubitril-valsartan 24-26 MG Commonly known as: ENTRESTO Take 1 tablet by mouth 2 (two) times daily.         Objective:   BP (!) 126/55   Pulse 84   Temp 98 F (36.7 C)   Ht '5\' 3"'$  (1.6 m)   Wt 176 lb (79.8 kg)   SpO2 93%   BMI 31.18 kg/m   Wt Readings from Last 3 Encounters:  03/30/22 176 lb (79.8 kg)  03/17/22 173 lb (78.5 kg)  03/15/22 174 lb (78.9 kg)    Physical Exam Vitals and nursing note reviewed.  Constitutional:      General: She is not in acute distress.    Appearance: She is well-developed. She is not diaphoretic.  Eyes:     Conjunctiva/sclera: Conjunctivae normal.  Cardiovascular:     Rate and Rhythm: Normal rate and regular rhythm.     Heart sounds: Normal heart sounds. No murmur heard. Pulmonary:     Effort: Pulmonary effort is normal. No respiratory distress.     Breath sounds: Normal breath sounds. No wheezing.  Musculoskeletal:        General: No tenderness. Normal range of motion.     Right hip: Crepitus present. No tenderness or bony tenderness. Normal range of motion. Normal strength.  Skin:    General: Skin is warm and dry.     Findings: No rash.  Neurological:     Mental Status: She is alert and oriented to person, place, and time.     Coordination: Coordination normal.  Psychiatric:        Behavior: Behavior normal.      Assessment & Plan:   Problem List Items Addressed This Visit       Cardiovascular and Mediastinum   Hypertension     Endocrine   Type 2 diabetes mellitus with other specified complication ([place]) - Primary   Relevant Orders   Bayer DCA Hb A1c Waived     Other   Hyperlipidemia   Hypertriglyceridemia   Other Visit Diagnoses     Need for shingles vaccine       Relevant Orders   Varicella-zoster vaccine IM (Shingrix) (Completed)   Primary osteoarthritis of right hip       Relevant Orders   Ambulatory referral to  Orthopedic Surgery       We will do referral to orthopedic for right hip osteoarthritis.  A1c was up slightly at 7.2, patient wants to try diet and will come back in the future.  If still elevated may consider medication at that point. Follow up plan: Return in about 3 months (around 06/30/2022), or if symptoms worsen or fail to improve, for Diabetes and hyperlipidemia and hypertriglyceridemia.  Counseling provided for all of the vaccine components Orders Placed This Encounter  Procedures   Varicella-zoster vaccine IM (Shingrix)  Bayer [facility] Hb A1c Waived   Ambulatory referral to Makanda Awanda Wilcock, MD [facility] 03/30/2022, 4:35 PM

## 2022-03-31 ENCOUNTER — Telehealth: Payer: Self-pay | Admitting: Family Medicine

## 2022-03-31 NOTE — Telephone Encounter (Signed)
Pt called stating that she changed her mind. Doesn't want her referral sent to [facility]. Wants it sent to [facility].

## 2022-04-03 ENCOUNTER — Other Ambulatory Visit: Payer: Self-pay | Admitting: Physician Assistant

## 2022-04-04 ENCOUNTER — Ambulatory Visit (INDEPENDENT_AMBULATORY_CARE_PROVIDER_SITE_OTHER): Payer: Medicare Other | Admitting: Pharmacist

## 2022-04-04 DIAGNOSIS — I4819 Other persistent atrial fibrillation: Secondary | ICD-10-CM

## 2022-04-04 DIAGNOSIS — E782 Mixed hyperlipidemia: Secondary | ICD-10-CM

## 2022-04-04 NOTE — Patient Instructions (Addendum)
Visit Information  Following are the goals we discussed today:  Current Barriers:  Unable to independently afford treatment regimen  Pharmacist Clinical Goal(s):  patient will verbalize ability to afford treatment regimen through collaboration with PharmD and provider.   Interventions: 1:1 collaboration with Dettinger, Fransisca Kaufmann, MD regarding development and update of comprehensive plan of care as evidenced by provider attestation and co-signature Inter-disciplinary care team collaboration (see longitudinal plan of care) Comprehensive medication review performed; medication list updated in electronic medical record  PMH per cards notes: ASD (s/p repair in 1994), thrombocytosis, HTN, HLD, valvular heart disease and recently diagnosed atrial fibrillation with associated cardiomyopathy (diagnosed in 02/2021 with echo showing EF of 30-35%)  Atrial Fibrillation/ CHF LVEF EF30-35%: Controlled; current rate/rhythm control: amiodarone, metoprolol; anticoagulant treatment: ELIQUIS CHADS2VASc score: at least 4 Home blood pressure, heart rate readings: controlled Educated on taking BP at home, aware of symptoms; denies signs & symptoms of bleeding Assessed patient finances. RE-ENROLLED IN healthwell grant today 02/28/22 for Delene Loll (card will be mailed to patient and she will take card to pharmacy and pick up entresto); application approved for eliquis PAP via [employer] patient assistance until 10/29/22 (will ship to patient's home) Patient has entresto and eliquis secured until next may 2024 Will f/u at that time  Hyperlipidemia -LDL<70 with T2DM--sees cardiology -Continue atorvastatin and fenofibrate (denies side effects)  Patient to try diet/lifestyle, but we are likely going to increase atorvastatin -recommend-->FOLLOWING A HEART HEALTHY DIET/HEALTHY PLATE METHOD Lipid Panel     Component Value Date/Time   CHOL 194 09/21/2021 1531   TRIG 311 (H) 09/21/2021 1531   HDL 46 09/21/2021 1531   CHOLHDL  4.2 09/21/2021 1531   CHOLHDL 6.6 12/08/2017 0202   VLDL 73 (H) 12/08/2017 0202   LDLCALC 96 09/21/2021 1531   LABVLDL 52 (H) 09/21/2021 1531     Patient Goals/Self-Care Activities patient will:  - take medications as prescribed as evidenced by patient report and record review collaborate with provider on medication access solutions target a minimum of 150 minutes of moderate intensity exercise weekly engage in dietary modifications by FOLLOWING A HEART HEALTHY DIET/HEALTHY PLATE METHOD    Plan: Telephone follow up appointment with care management team member scheduled for:  annually  Regina Eck, PharmD, BCPS Clinical Pharmacist, [place]  II Phone [phone]    Please call the care guide team at [phone] if you need to cancel or reschedule your appointment.   The patient verbalized understanding of instructions, educational materials, and care plan provided today and DECLINED offer to receive copy of patient instructions, educational materials, and care plan.

## 2022-04-04 NOTE — Progress Notes (Signed)
Chronic Care Management Pharmacy Note  04/04/2022 Name:  Heather Barber MRN:  [id number] DOB:  Oct 21, 1946  Summary: PMH per cards notes: ASD (s/p repair in 1994), thrombocytosis, HTN, HLD, valvular heart disease and recently diagnosed atrial fibrillation with associated cardiomyopathy (diagnosed in 02/2021 with echo showing EF of 30-35%)  Atrial Fibrillation/ CHF LVEF EF30-35%: Controlled; current rate/rhythm control: amiodarone, metoprolol; anticoagulant treatment: ELIQUIS CHADS2VASc score: at least 4 Home blood pressure, heart rate readings: controlled Educated on taking BP at home, aware of symptoms; denies signs & symptoms of bleeding Assessed patient finances. RE-ENROLLED IN healthwell grant 02/28/22 for Delene Loll (card will be mailed to patient and she will take card to pharmacy and pick up entresto); application approved for eliquis PAP via [employer] patient assistance until 10/29/22 (will ship to patient's home) Patient has entresto and eliquis secured until next may 2024 Will f/u at that time  Hyperlipidemia -LDL<70 with T2DM--sees cardiology -Continue atorvastatin and fenofibrate (denies side effects)  Patient to try diet/lifestyle, but we are likely going to increase atorvastatin -recommend-->FOLLOWING A HEART HEALTHY DIET/HEALTHY PLATE METHOD Lipid Panel     Component Value Date/Time   CHOL 194 09/21/2021 1531   TRIG 311 (H) 09/21/2021 1531   HDL 46 09/21/2021 1531   CHOLHDL 4.2 09/21/2021 1531   CHOLHDL 6.6 12/08/2017 0202   VLDL 73 (H) 12/08/2017 0202   LDLCALC 96 09/21/2021 1531   LABVLDL 52 (H) 09/21/2021 1531    Subjective: Heather Barber is an 76 y.o. year old female who is a primary patient of Dettinger, Fransisca Kaufmann, MD.  The CCM team was consulted for assistance with disease management and care coordination needs.    Engaged with patient by telephone for follow up visit in response to provider referral for pharmacy case management and/or care coordination services.    Consent to Services:  The patient was given information about Chronic Care Management services, agreed to services, and gave verbal consent prior to initiation of services.  Please see initial visit note for detailed documentation.   Patient Care Team: Dettinger, Fransisca Kaufmann, MD as PCP - General (Family Medicine) [name], MD as PCP - Cardiology (Cardiology) Felicie Morn, MD (Inactive) (General Surgery) Gala Romney Cristopher Estimable, MD as Consulting Physician (Gastroenterology) Lavera Guise, [facility] as Pharmacist (Family Medicine)  Objective:  Lab Results  Component Value Date   CREATININE 0.85 01/17/2022   CREATININE 1.02 (H) 12/26/2021   CREATININE 0.86 09/02/2021    Lab Results  Component Value Date   HGBA1C 7.2 (H) 03/30/2022   Last diabetic Eye exam: No results found for: HMDIABEYEEXA  Last diabetic Foot exam: No results found for: HMDIABFOOTEX      Component Value Date/Time   CHOL 194 09/21/2021 1531   TRIG 311 (H) 09/21/2021 1531   HDL 46 09/21/2021 1531   CHOLHDL 4.2 09/21/2021 1531   CHOLHDL 6.6 12/08/2017 0202   VLDL 73 (H) 12/08/2017 0202   LDLCALC 96 09/21/2021 1531       Latest Ref Rng & Units 01/17/2022    9:13 AM 12/26/2021    4:07 PM 09/02/2021   11:35 AM  Hepatic Function  Total Protein 6.5 - 8.1 g/dL 6.9   6.5   7.0    Albumin 3.5 - 5.0 g/dL 4.2   4.5   4.2    AST 15 - 41 U/L '19   19   17    ' ALT 0 - 44 U/L 19   18   16  Alk Phosphatase 38 - 126 U/L 57   49   45    Total Bilirubin 0.3 - 1.2 mg/dL 0.4   <0.2   0.6      Lab Results  Component Value Date/Time   TSH 28.800 (H) 12/26/2021 04:07 PM   TSH 15.652 (H) 09/02/2021 11:35 AM   TSH 3.423 02/28/2021 12:13 PM   TSH 1.565Test methodology is 3rd generation TSH 12/24/2009 12:12 PM       Latest Ref Rng & Units 01/17/2022    9:13 AM 09/02/2021   11:35 AM 06/06/2021    9:14 AM  CBC  WBC 4.0 - 10.5 K/uL 4.6   4.3   3.8    Hemoglobin 12.0 - 15.0 g/dL 12.4   11.1   11.1    Hematocrit 36.0 -  46.0 % 36.3   31.9   32.4    Platelets 150 - 400 K/uL 305   247   260      No results found for: VD25OH  Clinical ASCVD: No  The 10-year ASCVD risk score (Arnett DK, et al., 2019) is: 35.6%   Values used to calculate the score:     Age: 76 years     Sex: Female     Is Non-Hispanic African American: No     Diabetic: Yes     Tobacco smoker: No     Systolic Blood Pressure: 122 mmHg     Is BP treated: Yes     HDL Cholesterol: 46 mg/dL     Total Cholesterol: 194 mg/dL    Other: (CHADS2VASc if Afib, PHQ9 if depression, MMRC or CAT for COPD, ACT, DEXA)  Social History   Tobacco Use  Smoking Status Never  Smokeless Tobacco Never   BP Readings from Last 3 Encounters:  03/30/22 (!) 126/55  03/17/22 128/80  03/15/22 134/67   Pulse Readings from Last 3 Encounters:  03/30/22 84  03/17/22 71  03/15/22 76   Wt Readings from Last 3 Encounters:  03/30/22 176 lb (79.8 kg)  03/17/22 173 lb (78.5 kg)  03/15/22 174 lb (78.9 kg)    Assessment: Review of patient past medical history, allergies, medications, health status, including review of consultants reports, laboratory and other test data, was performed as part of comprehensive evaluation and provision of chronic care management services.   SDOH:  (Social Determinants of Health) assessments and interventions performed:    CCM Care Plan  Allergies  Allergen Reactions   Codeine Nausea And Vomiting   Morphine Other (See Comments)    GI symptoms    Medications Reviewed Today     Reviewed by Dettinger, Fransisca Kaufmann, MD (Physician) on 03/30/22 at 1557  Med List Status: <None>   Medication Order Taking? Sig Documenting Provider Last Dose Status Informant  amiodarone (PACERONE) 200 MG tablet [id number] Yes Take 0.5 tablets (100 mg total) by mouth daily. Ahmed Prima [place] M, PA-C Taking Active   apixaban (ELIQUIS) 5 MG TABS tablet [id number] Yes Take 1 tablet (5 mg total) by mouth 2 (two) times daily. [name], MD Taking Active    atorvastatin (LIPITOR) 20 MG tablet [id number] Yes Take 1 tablet (20 mg total) by mouth at bedtime. Dettinger, Fransisca Kaufmann, MD Taking Active   fenofibrate 160 MG tablet [id number]  Take 1 tablet (160 mg total) by mouth daily. [name], MD  Expired 03/17/22 2359   hydroxyurea (HYDREA) 500 MG capsule [id number] Yes TAKE 1 CAPSULE BY MOUTH TWICE A DAY Derek Jack, MD Taking Active  ketoconazole (NIZORAL) 2 % cream [id number] Yes Apply 1 application topically 2 (two) times daily. Dettinger, Fransisca Kaufmann, MD Taking Active   levothyroxine (SYNTHROID) 50 MCG tablet [id number] Yes Take 1 tablet (50 mcg total) by mouth daily. Dettinger, Fransisca Kaufmann, MD Taking Active   metoprolol succinate (TOPROL-XL) 100 MG 24 hr tablet [id number] Yes TAKE 1 TABLET (100 MG TOTAL) 2 (TWO) TIMES DAILY. TAKE WITH OR IMMEDIATELY FOLLOWING A MEAL. [name], MD Taking Active   Multiple Vitamins-Minerals (MULTIVITAMIN WOMEN 50+) TABS [id number] Yes Take by mouth daily. [provider] Taking Active Self  naproxen sodium (ALEVE) 220 MG tablet [id number] Yes Take 220 mg by mouth as needed. [provider] Taking Active Self  omeprazole (PRILOSEC) 20 MG capsule [id number] Yes Take 1 capsule (20 mg total) by mouth daily. Dettinger, Fransisca Kaufmann, MD Taking Active   sacubitril-valsartan [facility]) 24-26 [place] [id number] Yes Take 1 tablet by mouth 2 (two) times daily. Charlie Pitter, PA-C Taking Active            Med Note Leisa Lenz Jan 04, 2022 10:22 AM) Roxanne Gates             Patient Active Problem List   Diagnosis Date Noted   Elevated TSH 09/21/2021   A-fib ([place]) 03/01/2021   Hypertriglyceridemia 09/16/2020   Endometrial cancer, grade I ([place]) 10/09/2019   PMB (postmenopausal bleeding) 10/09/2019   Type 2 diabetes mellitus with other specified complication ([place]) 24/06/7352   SVT (supraventricular tachycardia) ([place]) 05/21/2019   Pseudophakia 03/13/2017   Gastroesophageal reflux  disease 07/24/2016   Thrombocytosis 06/06/2016   LBBB (left bundle branch block)    Ventricular tachycardia ([place])    ASD (atrial septal defect)    Hypertension    Hyperlipidemia 04/27/2010    Immunization History  Administered Date(s) Administered   Fluad Quad(high Dose 65+) 07/18/2019, 09/16/2020, 09/21/2021   Influenza, High Dose Seasonal PF 08/04/2016, 09/10/2017, 08/14/2018   Influenza,trivalent, recombinat, inj, PF 08/13/2014, 08/02/2015   Moderna Sars-Covid-2 Vaccination 12/25/2019, 01/23/2020, 09/11/2020   Pneumococcal Conjugate-13 11/25/2015   Pneumococcal Polysaccharide-23 11/30/2016   Zoster Recombinat (Shingrix) 03/30/2022    Conditions to be addressed/monitored: Atrial Fibrillation, CHF, HLD, and Hypertriglyceridemia  Care Plan : PHARMD MED MANAGEMENT  Updates made by Lavera Guise, RPH since 04/04/2022 12:00 AM     Problem: DISEASE PROGRESSION PREVENTION      Long-Range Goal: AFIB/CHF   Recent Progress: On track  Priority: High  Note:   Current Barriers:  Unable to independently afford treatment regimen  Pharmacist Clinical Goal(s):  patient will verbalize ability to afford treatment regimen through collaboration with PharmD and provider.   Interventions: 1:1 collaboration with Dettinger, Fransisca Kaufmann, MD regarding development and update of comprehensive plan of care as evidenced by provider attestation and co-signature Inter-disciplinary care team collaboration (see longitudinal plan of care) Comprehensive medication review performed; medication list updated in electronic medical record  PMH per cards notes: ASD (s/p repair in 1994), thrombocytosis, HTN, HLD, valvular heart disease and recently diagnosed atrial fibrillation with associated cardiomyopathy (diagnosed in 02/2021 with echo showing EF of 30-35%)  Atrial Fibrillation/ CHF LVEF EF30-35%: Controlled; current rate/rhythm control: amiodarone, metoprolol; anticoagulant treatment: ELIQUIS CHADS2VASc  score: at least 4 Home blood pressure, heart rate readings: controlled Educated on taking BP at home, aware of symptoms; denies signs & symptoms of bleeding Assessed patient finances. RE-ENROLLED IN healthwell grant today 02/28/22 for Delene Loll (card will be mailed to patient and she will take card to  pharmacy and pick up entresto); application approved for eliquis PAP via [employer] patient assistance  until 10/29/22 (will ship to patient's home) Patient has entresto and eliquis secured until next may 2024 Will f/u at that time  Hyperlipidemia -LDL<70 with T2DM--sees cardiology -Continue atorvastatin and fenofibrate (denies side effects)  Patient to try diet/lifestyle, but we are likely going to increase atorvastatin -recommend-->FOLLOWING A HEART HEALTHY DIET/HEALTHY PLATE METHOD Lipid Panel     Component Value Date/Time   CHOL 194 09/21/2021 1531   TRIG 311 (H) 09/21/2021 1531   HDL 46 09/21/2021 1531   CHOLHDL 4.2 09/21/2021 1531   CHOLHDL 6.6 12/08/2017 0202   VLDL 73 (H) 12/08/2017 0202   LDLCALC 96 09/21/2021 1531   LABVLDL 52 (H) 09/21/2021 1531    Patient Goals/Self-Care Activities patient will:  - take medications as prescribed as evidenced by patient report and record review collaborate with provider on medication access solutions target a minimum of 150 minutes of moderate intensity exercise weekly engage in dietary modifications by   FOLLOWING A HEART HEALTHY DIET/HEALTHY PLATE METHOD       Medication Assistance:  healthwell grant for entresto and eliquis via BMS  Patient's preferred pharmacy is:  [id number] [id number]: [phone]: [phone] Follow Up:  Patient agrees to Care Plan and Follow-up.  Plan: Telephone follow up appointment with care management team member scheduled for:  may 2024   JRegina Eck PharmD, BCPS Clinical Pharmacist, [place] Phone [phone]

## 2022-04-06 ENCOUNTER — Ambulatory Visit: Payer: Medicare Other | Admitting: Family Medicine

## 2022-04-18 ENCOUNTER — Encounter: Payer: Self-pay | Admitting: Orthopaedic Surgery

## 2022-04-18 ENCOUNTER — Ambulatory Visit (INDEPENDENT_AMBULATORY_CARE_PROVIDER_SITE_OTHER): Payer: Medicare Other | Admitting: Orthopaedic Surgery

## 2022-04-18 VITALS — Ht 63.0 in | Wt 173.0 lb

## 2022-04-18 DIAGNOSIS — M25551 Pain in right hip: Secondary | ICD-10-CM

## 2022-04-18 MED ORDER — METHYLPREDNISOLONE ACETATE 40 MG/ML IJ SUSP
40.0000 mg | INTRAMUSCULAR | Status: AC | PRN
  Administered 2022-04-18: 40 mg via INTRA_ARTICULAR

## 2022-04-18 MED ORDER — LIDOCAINE HCL 1 % IJ SOLN
3.0000 mL | INTRAMUSCULAR | Status: AC | PRN
  Administered 2022-04-18: 3 mL

## 2022-04-18 MED ORDER — BUPIVACAINE HCL 0.5 % IJ SOLN
3.0000 mL | INTRAMUSCULAR | Status: AC | PRN
  Administered 2022-04-18: 3 mL via INTRA_ARTICULAR

## 2022-04-18 NOTE — Progress Notes (Signed)
Office Visit Note   Patient: Heather Barber           Date of Birth: 1946/07/17           MRN: [id number] Visit Date: 04/18/2022              Requested by: Dettinger, Fransisca Kaufmann, MD [place],  [place] [address] PCP: Dettinger, Fransisca Kaufmann, MD   Assessment & Plan: Visit Diagnoses:  1. Pain in right hip     Plan: Impression is right lateral hip pain for a year.  Treatment options discussed patient would like to try cortisone injection today which she tolerated well.  Home exercises were provided as well.  Her granddaughter is physical therapy assistant who can help her with the exercises.  Follow-up as needed.  Follow-Up Instructions: No follow-ups on file.   Orders:  No orders of the defined types were placed in this encounter.  No orders of the defined types were placed in this encounter.     Procedures: Large Joint Inj: R greater trochanter on 04/18/2022 4:47 PM Indications: pain Details: 22 G needle  Arthrogram: No  Medications: 3 mL lidocaine 1 %; 3 mL bupivacaine 0.5 %; 40 mg methylPREDNISolone acetate 40 MG/ML Patient was prepped and draped in the usual sterile fashion.       Clinical Data: No additional findings.   Subjective: Chief Complaint  Patient presents with   Right Hip - Pain    HPI [name] a 76 year old female here for lateral right hip pain for about a year.  He had fallen off the back of a truck which started the pain.  Denies any groin pain or back pain no numbness and tingling.  Activities such as walking and standing make it worse.  Ibuprofen and Aleve gives temporary relief.  Review of Systems  Constitutional: Negative.   HENT: Negative.    Eyes: Negative.   Respiratory: Negative.    Cardiovascular: Negative.   Endocrine: Negative.   Musculoskeletal: Negative.   Neurological: Negative.   Hematological: Negative.   Psychiatric/Behavioral: Negative.    All other systems reviewed and are negative.    Objective: Vital Signs: Ht  '5\' 3"'$  (1.6 m)   Wt 173 lb (78.5 kg)   BMI 30.65 kg/m   Physical Exam Vitals and nursing note reviewed.  Constitutional:      Appearance: She is well-developed.  HENT:     Head: Normocephalic and atraumatic.  Pulmonary:     Effort: Pulmonary effort is normal.  Abdominal:     Palpations: Abdomen is soft.  Musculoskeletal:     Cervical back: Neck supple.  Skin:    General: Skin is warm.     Capillary Refill: Capillary refill takes less than 2 seconds.  Neurological:     Mental Status: She is alert and oriented to person, place, and time.  Psychiatric:        Behavior: Behavior normal.        Thought Content: Thought content normal.        Judgment: Judgment normal.     Ortho Exam Examination of right hip shows normal range of motion without pain.  Painless circumduction.  No sciatic tension signs.  Normal strength and sensation.  Tender to the posterior lateral aspect of the tip of the greater trochanter.  Specialty Comments:  No specialty comments available.  Imaging: No results found.   PMFS History: Patient Active Problem List   Diagnosis Date Noted   Elevated TSH  09/21/2021   A-fib ([place]) 03/01/2021   Hypertriglyceridemia 09/16/2020   Endometrial cancer, grade I ([place]) 10/09/2019   PMB (postmenopausal bleeding) 10/09/2019   Type 2 diabetes mellitus with other specified complication ([place]) 95/62/1308   SVT (supraventricular tachycardia) ([place]) 05/21/2019   Pseudophakia 03/13/2017   Gastroesophageal reflux disease 07/24/2016   Thrombocytosis 06/06/2016   LBBB (left bundle branch block)    Ventricular tachycardia ([place])    ASD (atrial septal defect)    Hypertension    Hyperlipidemia 04/27/2010   Past Medical History:  Diagnosis Date   (HFpEF) heart failure with preserved ejection fraction ([place])    a. EF previously 30-35% in 02/2021 in the setting of afib w/RVR --> normalized by repeat imaging in 05/2021.   Anxiety    Atrial flutter ([place])    a. s/p DCCV in  02/2021   Atrial septal defect    Surgical repair in 1994   Cardiomyopathy [facility])    Chest pain    Normal coronary angiography in 1994 and 2011   Dizziness    Endometrial cancer, grade I ([place]) 10/09/2019   Essential thrombocythemia ([place]) 06/06/2016   GERD (gastroesophageal reflux disease)    History of kidney stones    Hyperlipidemia    Hypertension    LBBB (left bundle branch block) 2011   Mitral regurgitation    Palpitations    Persistent atrial fibrillation ([name])    PONV (postoperative nausea and vomiting)    Syncope    Tricuspid regurgitation    Type 2 diabetes mellitus with other specified complication ([place]) 65/78/4696   Ventricular tachycardia ([place])    Exercise induced    Family History  Problem Relation Age of Onset   Cancer Mother        female   Heart failure Father    Stroke Sister    Cancer Sister        breast   Coronary artery disease Neg Hx     Past Surgical History:  Procedure Laterality Date   APPENDECTOMY     ASD [facility]   BREAST EXCISIONAL BIOPSY     Right and left   BREAST SURGERY     right and left breast-benign   CARDIAC CATHETERIZATION  12/24/2009   [name]    CARDIOVERSION N/A 03/04/2021   Procedure: CARDIOVERSION;  Surgeon: [name], MD;  Location: [facility];  Service: Endoscopy;  Laterality: N/A;   CATARACT EXTRACTION W/PHACO Left 03/11/2013   Procedure: CATARACT EXTRACTION PHACO AND INTRAOCULAR LENS PLACEMENT (IOC);  Surgeon: [name], MD;  Location: [facility];  Service: Ophthalmology;  Laterality: Left;  CDE:  12.20   HYSTEROSCOPY WITH D & C N/A 11/11/2019   Procedure: DILATATION AND CURETTAGE /HYSTEROSCOPY;  Surgeon: [name], MD;  Location: [facility];  Service: Gynecology;  Laterality: N/A;   POLYPECTOMY N/A 11/11/2019   Procedure: POLYPECTOMY(ENDOMETRIAL POLYP);  Surgeon: [name], MD;  Location: [facility];  Service: Gynecology;  Laterality: N/A;   ROBOTIC ASSISTED LAPAROSCOPIC HYSTERECTOMY  AND SALPINGECTOMY Bilateral 12/09/2019   Procedure: XI ROBOTIC ASSISTED LAPAROSCOPIC HYSTERECTOMY BILATERAL SALPINGOOOPHORECTOMY;  Surgeon: [name], MD;  Location: [facility];  Service: Gynecology;  Laterality: Bilateral;   SENTINEL NODE BIOPSY N/A 12/09/2019   Procedure: SENTINEL LYMPH  NODE BIOPSY;  Surgeon: [name], MD;  Location: [facility];  Service: Gynecology;  Laterality: N/A;   TEE WITHOUT CARDIOVERSION N/A 03/04/2021   Procedure: TRANSESOPHAGEAL ECHOCARDIOGRAM (TEE);  Surgeon: [name], MD;  Location: [facility];  Service: Endoscopy;  Laterality: N/A;   TONSILLECTOMY     TUBAL LIGATION     Bilateral   Social History   Occupational History   Occupation: Psychologist, prison and probation services  Tobacco Use   Smoking status: Never   Smokeless tobacco: Never  Vaping Use   Vaping Use: Never used  Substance and Sexual Activity   Alcohol use: No   Drug use: No   Sexual activity: Not Currently    Birth control/protection: Post-menopausal, Surgical    Comment: hyst

## 2022-04-20 ENCOUNTER — Encounter: Payer: Self-pay | Admitting: Adult Health

## 2022-04-20 ENCOUNTER — Ambulatory Visit (INDEPENDENT_AMBULATORY_CARE_PROVIDER_SITE_OTHER): Payer: Medicare Other | Admitting: Adult Health

## 2022-04-20 VITALS — BP 139/75 | HR 67 | Ht 63.0 in | Wt 175.0 lb

## 2022-04-20 DIAGNOSIS — C541 Malignant neoplasm of endometrium: Secondary | ICD-10-CM

## 2022-04-20 DIAGNOSIS — N939 Abnormal uterine and vaginal bleeding, unspecified: Secondary | ICD-10-CM | POA: Diagnosis not present

## 2022-04-20 DIAGNOSIS — Z1211 Encounter for screening for malignant neoplasm of colon: Secondary | ICD-10-CM

## 2022-04-20 LAB — HEMOCCULT GUIAC POC 1CARD (OFFICE): Fecal Occult Blood, POC: NEGATIVE

## 2022-04-20 NOTE — Progress Notes (Signed)
  Subjective:     [name], female   DOB: June 19, 1946, 76 y.o.   MRN: [id number]  HPI Heather Barber is a 76 year old white female, widowed, with a history of stage IA grade 1 endometrioid endometrial adenocarcinoma, s/p robotic staging on 12/09/19, she had normal pelvic exam 03/15/22, and this past weekend had brown then bright red vaginal bleeding, it has stopped, no pain, was not after BM PCP is Dr Dettinger  Review of Systems Vaginal bleeding this weekend, no pain Reviewed past medical,surgical, social and family history. Reviewed medications and allergies.     Objective:   Physical Exam BP 139/75 (BP Location: Right Arm, Patient Position: Sitting, Cuff Size: Normal)   Pulse 67   Ht '5\' 3"'$  (1.6 m)   Wt 175 lb (79.4 kg)   BMI 31.00 kg/m     Pelvic: external genitalia is normal in appearance no lesions, vagina: pale with loss of rugae, no lesions seen,urethra has no lesions or masses noted, cervix and uterus are absent, adnexa: no masses or tenderness noted. Bladder is non tender and no masses felt.   Co exam with Dr Elonda Husky, no lesions. On rectal has good tone, no masses and hemoccult was negative. Examination chaperoned by Celene Squibb LPN  Assessment:     1. Vaginal bleeding No lesions seen  Call if reoccurs   2. Endometrial cancer, grade I ([place]) Follow up as scheduled every 6 months for pelvic exam  3. Encounter for screening fecal occult blood testing Hemoccult was negative     Plan:     Follow up as scheduled

## 2022-04-28 DIAGNOSIS — I509 Heart failure, unspecified: Secondary | ICD-10-CM | POA: Diagnosis not present

## 2022-04-28 DIAGNOSIS — E785 Hyperlipidemia, unspecified: Secondary | ICD-10-CM

## 2022-04-28 DIAGNOSIS — I4891 Unspecified atrial fibrillation: Secondary | ICD-10-CM | POA: Diagnosis not present

## 2022-05-30 ENCOUNTER — Other Ambulatory Visit: Payer: Self-pay | Admitting: Cardiology

## 2022-06-19 ENCOUNTER — Other Ambulatory Visit: Payer: Self-pay | Admitting: Physician Assistant

## 2022-06-19 NOTE — Progress Notes (Deleted)
RESCHEDULED

## 2022-06-20 ENCOUNTER — Inpatient Hospital Stay: Payer: Medicare Other | Admitting: Physician Assistant

## 2022-06-20 ENCOUNTER — Inpatient Hospital Stay: Payer: Medicare Other

## 2022-06-23 ENCOUNTER — Other Ambulatory Visit: Payer: Self-pay | Admitting: Family Medicine

## 2022-06-26 ENCOUNTER — Other Ambulatory Visit: Payer: Self-pay | Admitting: Nurse Practitioner

## 2022-06-26 DIAGNOSIS — D473 Essential (hemorrhagic) thrombocythemia: Secondary | ICD-10-CM

## 2022-06-29 ENCOUNTER — Other Ambulatory Visit: Payer: Self-pay | Admitting: Nurse Practitioner

## 2022-06-29 DIAGNOSIS — D473 Essential (hemorrhagic) thrombocythemia: Secondary | ICD-10-CM

## 2022-07-06 ENCOUNTER — Ambulatory Visit (INDEPENDENT_AMBULATORY_CARE_PROVIDER_SITE_OTHER): Payer: Medicare Other | Admitting: Family Medicine

## 2022-07-06 ENCOUNTER — Encounter: Payer: Self-pay | Admitting: Family Medicine

## 2022-07-06 VITALS — BP 106/59 | HR 72 | Temp 98.0°F | Ht 63.0 in | Wt 172.0 lb

## 2022-07-06 DIAGNOSIS — E782 Mixed hyperlipidemia: Secondary | ICD-10-CM

## 2022-07-06 DIAGNOSIS — E781 Pure hyperglyceridemia: Secondary | ICD-10-CM | POA: Diagnosis not present

## 2022-07-06 DIAGNOSIS — E1169 Type 2 diabetes mellitus with other specified complication: Secondary | ICD-10-CM | POA: Diagnosis not present

## 2022-07-06 DIAGNOSIS — E039 Hypothyroidism, unspecified: Secondary | ICD-10-CM

## 2022-07-06 DIAGNOSIS — Z23 Encounter for immunization: Secondary | ICD-10-CM

## 2022-07-06 DIAGNOSIS — I10 Essential (primary) hypertension: Secondary | ICD-10-CM

## 2022-07-06 LAB — BAYER DCA HB A1C WAIVED: HB A1C (BAYER DCA - WAIVED): 7.1 % — ABNORMAL HIGH (ref 4.8–5.6)

## 2022-07-06 MED ORDER — ACCU-CHEK SOFTCLIX LANCETS MISC: 1.0000 | Freq: Every day | 3 refills | Status: DC

## 2022-07-06 MED ORDER — ACCU-CHEK GUIDE VI STRP: 1.0000 | ORAL_STRIP | Freq: Every day | 3 refills | Status: DC

## 2022-07-06 NOTE — Progress Notes (Signed)
BP (!) 106/59   Pulse 72   Temp 98 F (36.7 C)   Ht 5' 3" (1.6 m)   Wt 172 lb (78 kg)   SpO2 95%   BMI 30.47 kg/m    Subjective:   [name], female    DOB: [birth year], 76 y.o.   MRN: [id number]  HPI: Heather Barber is a 76 y.o. female presenting on 07/06/2022 for Medical Management of Chronic Issues   HPI Type 2 diabetes mellitus Patient comes in today for recheck of his diabetes. Patient has been currently taking no medication currently, diet control. Patient is currently on an ACE inhibitor/ARB. Patient has not seen an ophthalmologist this year. Patient denies any issues with their feet. The symptom started onset as an adult hypertension and hyperlipidemia and hypertriglyceridemia and hypothyroidism ARE RELATED TO DM   Hypertension Patient is currently on metoprolol and amiodarone and Entresto, and their blood pressure today is 106/59. Patient denies any lightheadedness or dizziness. Patient denies headaches, blurred vision, chest pains, shortness of breath, or weakness. Denies any side effects from medication and is content with current medication.   Hyperlipidemia and hypertriglyceridemia Patient is coming in for recheck of his hyperlipidemia. The patient is currently taking Lipitor and fenofibrate. They deny any issues with myalgias or history of liver damage from it. They deny any focal numbness or weakness or chest pain.   Hypothyroidism recheck Patient is coming in for thyroid recheck today as well. They deny any issues with hair changes or heat or cold problems or diarrhea or constipation. They deny any chest pain or palpitations. They are currently on levothyroxine 50 micrograms   Relevant past medical, surgical, family and social history reviewed and updated as indicated. Interim medical history since our last visit reviewed. Allergies and medications reviewed and updated.  Review of Systems  Constitutional:  Negative for chills and fever.  Eyes:   Negative for visual disturbance.  Respiratory:  Negative for chest tightness and shortness of breath.   Cardiovascular:  Negative for chest pain and leg swelling.  Genitourinary:  Negative for difficulty urinating and dysuria.  Musculoskeletal:  Negative for back pain and gait problem.  Skin:  Negative for rash.  Neurological:  Negative for dizziness, light-headedness and headaches.  Psychiatric/Behavioral:  Negative for agitation and behavioral problems.   All other systems reviewed and are negative.   Per HPI unless specifically indicated above   Allergies as of 07/06/2022       Reactions   Codeine Nausea And Vomiting   Morphine Other (See Comments)   GI symptoms        Medication List        Accurate as of July 06, 2022  4:16 PM. If you have any questions, ask your nurse or doctor.          Accu-Chek Guide test strip Generic drug: glucose blood 1 each by Other route daily. Use as instructed Started by: Fransisca Kaufmann Dettinger, MD   Accu-Chek Softclix Lancets lancets 1 each by Other route daily at 12 noon. Use as instructed Started by: Fransisca Kaufmann Dettinger, MD   amiodarone 200 MG tablet Commonly known as: Pacerone Take 0.5 tablets (100 mg total) by mouth daily.   apixaban 5 MG Tabs tablet Commonly known as: ELIQUIS Take 1 tablet (5 mg total) by mouth 2 (two) times daily.   atorvastatin 20 MG tablet Commonly known as: LIPITOR Take 1 tablet (20 mg total) by mouth at bedtime.   Delene Loll  24-26 MG Generic drug: sacubitril-valsartan TAKE 1 TABLET BY MOUTH TWICE DAILY   fenofibrate 160 MG tablet TAKE 1 TABLET BY MOUTH DAILY   hydroxyurea 500 MG capsule Commonly known as: HYDREA TAKE 1 CAPSULE BY MOUTH TWICE DAILY   ketoconazole 2 % cream Commonly known as: NIZORAL Apply 1 application topically 2 (two) times daily.   levothyroxine 50 MCG tablet Commonly known as: SYNTHROID Take 1 tablet (50 mcg total) by mouth daily. (NEEDS TO BE SEEN BEFORE NEXT REFILL)    metoprolol succinate 100 MG 24 hr tablet Commonly known as: TOPROL-XL TAKE 1 TABLET BY MOUTH TWICE DAILY WITH A MEAL   Multivitamin Women 50+ Tabs Take by mouth daily.   naproxen sodium 220 MG tablet Commonly known as: ALEVE Take 220 mg by mouth as needed.   omeprazole 20 MG capsule Commonly known as: PRILOSEC Take 1 capsule (20 mg total) by mouth daily.         Objective:   BP (!) 106/59   Pulse 72   Temp 98 F (36.7 C)   Ht 5' 3" (1.6 m)   Wt 172 lb (78 kg)   SpO2 95%   BMI 30.47 kg/m   Wt Readings from Last 3 Encounters:  07/06/22 172 lb (78 kg)  04/20/22 175 lb (79.4 kg)  04/18/22 173 lb (78.5 kg)    Physical Exam Vitals and nursing note reviewed.  Constitutional:      General: She is not in acute distress.    Appearance: She is well-developed. She is not diaphoretic.  Eyes:     Conjunctiva/sclera: Conjunctivae normal.     Pupils: Pupils are equal, round, and reactive to light.  Cardiovascular:     Rate and Rhythm: Normal rate and regular rhythm.     Heart sounds: Normal heart sounds. No murmur heard. Pulmonary:     Effort: Pulmonary effort is normal. No respiratory distress.     Breath sounds: Normal breath sounds. No wheezing.  Musculoskeletal:        General: No tenderness. Normal range of motion.  Skin:    General: Skin is warm and dry.     Findings: No rash.  Neurological:     Mental Status: She is alert and oriented to person, place, and time.     Coordination: Coordination normal.  Psychiatric:        Behavior: Behavior normal.       Assessment & Plan:   Problem List Items Addressed This Visit       Cardiovascular and Mediastinum   Hypertension   Relevant Orders   CBC with Differential/Platelet   CMP14+EGFR   Lipid panel   Bayer DCA Hb A1c Waived     Endocrine   Type 2 diabetes mellitus with other specified complication ([name]) - Primary   Relevant Medications   Accu-Chek Softclix Lancets lancets   glucose blood (ACCU-CHEK  GUIDE) test strip   Other Relevant Orders   CBC with Differential/Platelet   CMP14+EGFR   Lipid panel   Bayer DCA Hb A1c Waived   Hypothyroidism   Relevant Orders   TSH     Other   Hyperlipidemia   Relevant Orders   CBC with Differential/Platelet   CMP14+EGFR   Lipid panel   Bayer DCA Hb A1c Waived   Hypertriglyceridemia   Other Visit Diagnoses     Need for shingles vaccine       Relevant Orders   Zoster Recombinant (Shingrix ) (Completed)       A1c  is slightly improved at 7.1, continue with diet, no changes for now.  She is trying to lose weight and be more active but I think it is helping, just keep going. Follow up plan: Return in about 3 months (around 10/05/2022), or if symptoms worsen or fail to improve, for Thyroid and diabetes and hypertension and cholesterol.  Counseling provided for all of the vaccine components Orders Placed This Encounter  Procedures   Zoster Recombinant (Shingrix )   CBC with Differential/Platelet   CMP14+EGFR   Lipid panel   Bayer DCA Hb A1c Waived   TSH    Caryl Pina, MD [facility] 07/06/2022, 4:16 PM

## 2022-07-07 LAB — CMP14+EGFR
ALT: 13 IU/L (ref 0–32)
AST: 12 IU/L (ref 0–40)
Albumin/Globulin Ratio: 2.5 — ABNORMAL HIGH (ref 1.2–2.2)
Albumin: 4.9 g/dL — ABNORMAL HIGH (ref 3.8–4.8)
Alkaline Phosphatase: 60 IU/L (ref 44–121)
BUN/Creatinine Ratio: 21 (ref 12–28)
BUN: 23 mg/dL (ref 8–27)
Bilirubin Total: 0.2 mg/dL (ref 0.0–1.2)
CO2: 23 mmol/L (ref 20–29)
Calcium: 10.4 mg/dL — ABNORMAL HIGH (ref 8.7–10.3)
Chloride: 103 mmol/L (ref 96–106)
Creatinine, Ser: 1.1 mg/dL — ABNORMAL HIGH (ref 0.57–1.00)
Globulin, Total: 2 g/dL (ref 1.5–4.5)
Glucose: 122 mg/dL — ABNORMAL HIGH (ref 70–99)
Potassium: 5.4 mmol/L — ABNORMAL HIGH (ref 3.5–5.2)
Sodium: 144 mmol/L (ref 134–144)
Total Protein: 6.9 g/dL (ref 6.0–8.5)
eGFR: 52 mL/min/{1.73_m2} — ABNORMAL LOW (ref >59)

## 2022-07-07 LAB — CBC WITH DIFFERENTIAL/PLATELET
Basophils Absolute: 0.1 10*3/uL (ref 0.0–0.2)
Basos: 1 %
EOS (ABSOLUTE): 0.1 10*3/uL (ref 0.0–0.4)
Eos: 2 %
Hematocrit: 34.2 % (ref 34.0–46.6)
Hemoglobin: 12.4 g/dL (ref 11.1–15.9)
Immature Grans (Abs): 0 10*3/uL (ref 0.0–0.1)
Immature Granulocytes: 0 %
Lymphocytes Absolute: 2.3 10*3/uL (ref 0.7–3.1)
Lymphs: 37 %
MCH: 35.8 pg — ABNORMAL HIGH (ref 26.6–33.0)
MCHC: 36.3 g/dL — ABNORMAL HIGH (ref 31.5–35.7)
MCV: 99 fL — ABNORMAL HIGH (ref 79–97)
Monocytes Absolute: 0.5 10*3/uL (ref 0.1–0.9)
Monocytes: 8 %
Neutrophils Absolute: 3.2 10*3/uL (ref 1.4–7.0)
Neutrophils: 52 %
Platelets: 361 10*3/uL (ref 150–450)
RBC: 3.46 x10E6/uL — ABNORMAL LOW (ref 3.77–5.28)
RDW: 12.6 % (ref 11.7–15.4)
WBC: 6.3 10*3/uL (ref 3.4–10.8)

## 2022-07-07 LAB — LIPID PANEL
Chol/HDL Ratio: 4.3 ratio (ref 0.0–4.4)
Cholesterol, Total: 216 mg/dL — ABNORMAL HIGH (ref 100–199)
HDL: 50 mg/dL (ref >39)
LDL Chol Calc (NIH): 125 mg/dL — ABNORMAL HIGH (ref 0–99)
Triglycerides: 231 mg/dL — ABNORMAL HIGH (ref 0–149)
VLDL Cholesterol Cal: 41 mg/dL — ABNORMAL HIGH (ref 5–40)

## 2022-07-08 LAB — TSH: TSH: 21.5 u[IU]/mL — ABNORMAL HIGH (ref 0.450–4.500)

## 2022-07-08 LAB — SPECIMEN STATUS REPORT

## 2022-07-12 ENCOUNTER — Ambulatory Visit (INDEPENDENT_AMBULATORY_CARE_PROVIDER_SITE_OTHER): Payer: Medicare Other | Admitting: Nurse Practitioner

## 2022-07-12 ENCOUNTER — Encounter: Payer: Self-pay | Admitting: Nurse Practitioner

## 2022-07-12 VITALS — BP 100/69 | HR 78 | Temp 98.6°F | Ht 63.0 in | Wt 173.0 lb

## 2022-07-12 DIAGNOSIS — R300 Dysuria: Secondary | ICD-10-CM

## 2022-07-12 LAB — URINALYSIS, COMPLETE
Bilirubin, UA: NEGATIVE
Glucose, UA: NEGATIVE
Nitrite, UA: NEGATIVE
Specific Gravity, UA: >1.03 — ABNORMAL HIGH (ref 1.005–1.030)
Urobilinogen, Ur: 1 mg/dL (ref 0.2–1.0)
pH, UA: 5.5 (ref 5.0–7.5)

## 2022-07-12 LAB — MICROSCOPIC EXAMINATION
Renal Epithel, UA: NONE SEEN /hpf
WBC, UA: >30 /hpf — AB (ref 0–5)

## 2022-07-12 MED ORDER — CEPHALEXIN 500 MG PO CAPS: 500.0000 mg | ORAL_CAPSULE | Freq: Two times a day (BID) | ORAL | 0 refills | Status: DC

## 2022-07-12 NOTE — Patient Instructions (Signed)
Urinary Tract Infection, Adult A urinary tract infection (UTI) is an infection of any part of the urinary tract. The urinary tract includes: The kidneys. The ureters. The bladder. The urethra. These organs make, store, and get rid of pee (urine) in the body. What are the causes? This infection is caused by germs (bacteria) in your genital area. These germs grow and cause swelling (inflammation) of your urinary tract. What increases the risk? The following factors may make you more likely to develop this condition: Using a small, thin tube (catheter) to drain pee. Not being able to control when you pee or poop (incontinence). Being female. If you are female, these things can increase the risk: Using these methods to prevent pregnancy: A medicine that kills sperm (spermicide). A device that blocks sperm (diaphragm). Having low levels of a female hormone (estrogen). Being pregnant. You are more likely to develop this condition if: You have genes that add to your risk. You are sexually active. You take antibiotic medicines. You have trouble peeing because of: A prostate that is bigger than normal, if you are female. A blockage in the part of your body that drains pee from the bladder. A kidney stone. A nerve condition that affects your bladder. Not getting enough to drink. Not peeing often enough. You have other conditions, such as: Diabetes. A weak disease-fighting system (immune system). Sickle cell disease. Gout. Injury of the spine. What are the signs or symptoms? Symptoms of this condition include: Needing to pee right away. Peeing small amounts often. Pain or burning when peeing. Blood in the pee. Pee that smells bad or not like normal. Trouble peeing. Pee that is cloudy. Fluid coming from the vagina, if you are female. Pain in the belly or lower back. Other symptoms include: Vomiting. Not feeling hungry. Feeling mixed up (confused). This may be the first symptom in  older adults. Being tired and grouchy (irritable). A fever. Watery poop (diarrhea). How is this treated? Taking antibiotic medicine. Taking other medicines. Drinking enough water. In some cases, you may need to see a specialist. Follow these instructions at home:  Medicines Take over-the-counter and prescription medicines only as told by your doctor. If you were prescribed an antibiotic medicine, take it as told by your doctor. Do not stop taking it even if you start to feel better. General instructions Make sure you: Pee until your bladder is empty. Do not hold pee for a long time. Empty your bladder after sex. Wipe from front to back after peeing or pooping if you are a female. Use each tissue one time when you wipe. Drink enough fluid to keep your pee pale yellow. Keep all follow-up visits. Contact a doctor if: You do not get better after 1-2 days. Your symptoms go away and then come back. Get help right away if: You have very bad back pain. You have very bad pain in your lower belly. You have a fever. You have chills. You feeling like you will vomit or you vomit. Summary A urinary tract infection (UTI) is an infection of any part of the urinary tract. This condition is caused by germs in your genital area. There are many risk factors for a UTI. Treatment includes antibiotic medicines. Drink enough fluid to keep your pee pale yellow. This information is not intended to replace advice given to you by your health care provider. Make sure you discuss any questions you have with your health care provider. Document Revised: 05/28/2020 Document Reviewed: 05/28/2020 Elsevier Patient Education    2023 Elsevier Inc.  

## 2022-07-12 NOTE — Progress Notes (Signed)
Acute Office Visit  Subjective:     [name], female    DOB: 09-02-1946, 76 y.o.   MRN: [id number]  Chief Complaint  Patient presents with   Dysuria    Started Monday    Urinary Frequency    Dysuria  This is a new problem. The current episode started yesterday. The problem occurs every urination. The problem has been gradually worsening. The quality of the pain is described as burning and aching. The pain is mild. There has been no fever. Associated symptoms include frequency. Pertinent negatives include no chills, discharge, nausea or vomiting. She has tried nothing for the symptoms.  Urinary Frequency  This is a new problem. The current episode started yesterday. The problem occurs every urination. The quality of the pain is described as aching and burning. The pain is mild. There has been no fever. Associated symptoms include frequency. Pertinent negatives include no chills, discharge, nausea or vomiting.    Review of Systems  Constitutional: Negative.  Negative for chills.  HENT: Negative.    Respiratory: Negative.    Cardiovascular: Negative.   Gastrointestinal:  Negative for abdominal pain, nausea and vomiting.  Genitourinary:  Positive for dysuria and frequency.  Musculoskeletal: Negative.   Skin: Negative.  Negative for itching and rash.  All other systems reviewed and are negative.       Objective:    BP 100/69   Pulse 78   Temp 98.6 F (37 C)   Ht '5\' 3"'$  (1.6 m)   Wt 173 lb (78.5 kg)   SpO2 93%   BMI 30.65 kg/m    Physical Exam Vitals and nursing note reviewed.  Constitutional:      Appearance: Normal appearance.  HENT:     Head: Normocephalic.     Nose: Nose normal.     Mouth/Throat:     Mouth: Mucous membranes are moist.     Pharynx: Oropharynx is clear.  Eyes:     Conjunctiva/sclera: Conjunctivae normal.  Cardiovascular:     Rate and Rhythm: Normal rate and regular rhythm.     Pulses: Normal pulses.     Heart sounds: Normal  heart sounds.  Pulmonary:     Effort: Pulmonary effort is normal.     Breath sounds: Normal breath sounds.  Abdominal:     General: Bowel sounds are normal.     Tenderness: There is no right CVA tenderness or left CVA tenderness.  Musculoskeletal:        General: Normal range of motion.  Skin:    General: Skin is warm.  Neurological:     General: No focal deficit present.     Mental Status: She is alert and oriented to person, place, and time.  Psychiatric:        Mood and Affect: Mood normal.        Behavior: Behavior normal.     No results found for any visits on 07/12/22.      Assessment & Plan:   UTI  vs  interstitial cystitis -urinalysis completed results pending -pyridium for pain -Keflex Precaution and education provided -All questions answered -follow up with unresolved symptoms  Problem List Items Addressed This Visit   None Visit Diagnoses     Dysuria    -  Primary   Relevant Medications   cephALEXin (KEFLEX) 500 MG capsule   Other Relevant Orders   Urinalysis, Complete   Urine Culture       Meds ordered this encounter  Medications   cephALEXin (KEFLEX) 500 MG capsule    Sig: Take 1 capsule (500 mg total) by mouth 2 (two) times daily.    Dispense:  14 capsule    Refill:  0    Order Specific Question:   Supervising Provider    Answer:   Claretta Fraise [[id number]]    Return if symptoms worsen or fail to improve, for dysuria.  Ivy Lynn, NP

## 2022-07-15 LAB — URINE CULTURE

## 2022-07-17 ENCOUNTER — Other Ambulatory Visit: Payer: Self-pay | Admitting: Nurse Practitioner

## 2022-07-17 ENCOUNTER — Other Ambulatory Visit: Payer: Self-pay | Admitting: Family Medicine

## 2022-07-17 DIAGNOSIS — D473 Essential (hemorrhagic) thrombocythemia: Secondary | ICD-10-CM

## 2022-07-18 ENCOUNTER — Other Ambulatory Visit: Payer: Self-pay

## 2022-07-18 ENCOUNTER — Other Ambulatory Visit: Payer: Self-pay | Admitting: Nurse Practitioner

## 2022-07-18 DIAGNOSIS — D473 Essential (hemorrhagic) thrombocythemia: Secondary | ICD-10-CM

## 2022-07-18 MED ORDER — SULFAMETHOXAZOLE-TRIMETHOPRIM 800-160 MG PO TABS: 1.0000 | ORAL_TABLET | Freq: Two times a day (BID) | ORAL | 0 refills | Status: DC

## 2022-07-18 MED ORDER — HYDROXYUREA 500 MG PO CAPS: 500.0000 mg | ORAL_CAPSULE | Freq: Two times a day (BID) | ORAL | 2 refills | Status: DC

## 2022-07-18 MED ORDER — HYDROXYUREA 500 MG PO CAPS: 500.0000 mg | ORAL_CAPSULE | Freq: Two times a day (BID) | ORAL | 1 refills | Status: DC

## 2022-07-18 NOTE — Telephone Encounter (Signed)
Chart reviewed. Hydrea refilled per last office note with Tarri Abernethy, PA-C.

## 2022-07-18 NOTE — Telephone Encounter (Signed)
Refill from 06/30/22 failed, resent

## 2022-07-18 NOTE — Addendum Note (Signed)
Addended by: Antonietta Barcelona D on: 07/18/2022 07:57 AM   Modules accepted: Orders

## 2022-07-20 ENCOUNTER — Other Ambulatory Visit: Payer: Self-pay

## 2022-07-20 MED ORDER — LEVOTHYROXINE SODIUM 75 MCG PO TABS: 75.0000 ug | ORAL_TABLET | Freq: Every day | ORAL | 6 refills | Status: DC

## 2022-07-20 MED ORDER — ATORVASTATIN CALCIUM 40 MG PO TABS: 40.0000 mg | ORAL_TABLET | Freq: Every evening | ORAL | 3 refills | Status: DC

## 2022-08-14 ENCOUNTER — Other Ambulatory Visit: Payer: Self-pay | Admitting: Student

## 2022-08-21 ENCOUNTER — Inpatient Hospital Stay: Payer: Medicare Other

## 2022-08-21 ENCOUNTER — Inpatient Hospital Stay: Payer: Medicare Other | Attending: Physician Assistant | Admitting: Physician Assistant

## 2022-08-21 DIAGNOSIS — D75839 Thrombocytosis, unspecified: Secondary | ICD-10-CM

## 2022-08-21 DIAGNOSIS — Z79899 Other long term (current) drug therapy: Secondary | ICD-10-CM | POA: Diagnosis not present

## 2022-08-21 DIAGNOSIS — D473 Essential (hemorrhagic) thrombocythemia: Secondary | ICD-10-CM | POA: Insufficient documentation

## 2022-08-21 LAB — CBC WITH DIFFERENTIAL/PLATELET
Abs Immature Granulocytes: 0.04 10*3/uL (ref 0.00–0.07)
Basophils Absolute: 0 10*3/uL (ref 0.0–0.1)
Basophils Relative: 1 %
Eosinophils Absolute: 0.1 10*3/uL (ref 0.0–0.5)
Eosinophils Relative: 2 %
HCT: 36.7 % (ref 36.0–46.0)
Hemoglobin: 12.4 g/dL (ref 12.0–15.0)
Immature Granulocytes: 1 %
Lymphocytes Relative: 33 %
Lymphs Abs: 1.9 10*3/uL (ref 0.7–4.0)
MCH: 35.6 pg — ABNORMAL HIGH (ref 26.0–34.0)
MCHC: 33.8 g/dL (ref 30.0–36.0)
MCV: 105.5 fL — ABNORMAL HIGH (ref 80.0–100.0)
Monocytes Absolute: 0.5 10*3/uL (ref 0.1–1.0)
Monocytes Relative: 9 %
Neutro Abs: 3.2 10*3/uL (ref 1.7–7.7)
Neutrophils Relative %: 54 %
Platelets: 347 10*3/uL (ref 150–400)
RBC: 3.48 MIL/uL — ABNORMAL LOW (ref 3.87–5.11)
RDW: 12.9 % (ref 11.5–15.5)
WBC: 5.7 10*3/uL (ref 4.0–10.5)
nRBC: 0 % (ref 0.0–0.2)

## 2022-08-21 LAB — COMPREHENSIVE METABOLIC PANEL
ALT: 17 U/L (ref 0–44)
AST: 16 U/L (ref 15–41)
Albumin: 4.3 g/dL (ref 3.5–5.0)
Alkaline Phosphatase: 48 U/L (ref 38–126)
Anion gap: 10 (ref 5–15)
BUN: 22 mg/dL (ref 8–23)
CO2: 27 mmol/L (ref 22–32)
Calcium: 9.5 mg/dL (ref 8.9–10.3)
Chloride: 103 mmol/L (ref 98–111)
Creatinine, Ser: 0.79 mg/dL (ref 0.44–1.00)
GFR, Estimated: >60 mL/min (ref >60)
Glucose, Bld: 157 mg/dL — ABNORMAL HIGH (ref 70–99)
Potassium: 3.9 mmol/L (ref 3.5–5.1)
Sodium: 140 mmol/L (ref 135–145)
Total Bilirubin: 0.6 mg/dL (ref 0.3–1.2)
Total Protein: 7.2 g/dL (ref 6.5–8.1)

## 2022-08-21 LAB — LACTATE DEHYDROGENASE: LDH: 151 U/L (ref 98–192)

## 2022-09-13 NOTE — Progress Notes (Unsigned)
Virtual Visit via Telephone Note [facility]  I connected with Heather Barber  on *** at  *** by telephone and verified that I am speaking with the correct person using two identifiers.  Location: Patient: Home Provider: [facility]   I discussed the limitations, risks, security and privacy concerns of performing an evaluation and management service by telephone and the availability of in person appointments. I also discussed with the patient that there may be a patient responsible charge related to this service. The patient expressed understanding and agreed to proceed.  REASON FOR VISIT:  Follow-up for JAK2 positive essential thrombocytosis   PRIOR THERAPY: None   CURRENT THERAPY: Hydrea 500 mg once daily    INTERVAL HISTORY:  Heather Barber 76 y.o. female is contacted today for routine follow-up of her essential thrombocytosis.  She was last seen by Rojelio Brenner PA-C on 02/13/2022.   At today's visit, she reports feeling fair.  ***  No recent hospitalizations, surgeries, or changes in baseline health status.  She is tolerating Hydrea well at 500 mg daily.   *** She denies any GI symptoms such as nausea, vomiting, diarrhea.  She has not noted any mouth sores or skin ulcers.   *** Energy levels are stable with some mild fatigue.   She denies any erythromelalgia, aquagenic pruritus, Raynaud's phenomenon, or vasomotor symptoms.  *** *** No signs or symptoms of DVT or PE. *** No B symptoms such as fever, chills, night sweats, unintentional weight loss. *** She denies any pelvic pain or vaginal bleeding, in light of her history of previous endometrial cancer.  Regarding her anemia, she denies any chest pain, dyspnea on exertion, syncopal episodes.   *** *** She has not noticed any bleeding such as epistaxis, hematemesis, hematochezia, or melena. *** She is taking iron tablet and vitamin B12 supplement daily.  She has  *** % energy and  *** % appetite. She  endorses that she is maintaining a stable weight.    OBSERVATIONS/OBJECTIVE: ROS ***  PHYSICAL EXAM (per limitations of virtual telephone visit): The patient is alert and oriented x 3, exhibiting adequate mentation, good mood, and ability to speak in full sentences and execute sound judgement.   ASSESSMENT & PLAN: 1.  JAK2 positive essential thrombocytosis - High risk because of her advanced age. - Hydroxyurea 500 mg daily, currently tolerating well - No prior history of thrombosis.   - She does not report any aquagenic pruritus or vasomotor symptoms.  ***  - No lymphadenopathy or splenomegaly on exam at last office visit (April 2023) - Most recent labs (08/21/2022): Platelets 347.  Hgb 12.4/MCV 105.5.  Normal WBC and differential. - PLAN: Continue Hydrea 500 mg daily   ***  - RTC 4 months for repeat labs and follow-up.   2.  Anemia with iron deficiency and B12 deficiency - Labs in November 2022 showed mildly decreased Hgb 11.1, MCV 120.8 (macrocytosis secondary to Hydrea). - She was also noted to have mild iron deficiency (ferritin 56, iron saturation 17%, TIBC 508) and B12 deficiency (147) - She was suspected to have have some element of anemia secondary to Hydrea. - Additional labs for work-up of anemia were unremarkable (normal creatinine 0.86, normal folate, copper; SPEP and free light chains unremarkable) - She was started on oral iron supplementation and B12 supplementation in November 2022. - Most recent CBC (08/21/2022) showed resolution of anemia with Hgb 12.4/MCV 105.5. - No bright blood per rectum or melena. ***  -  PLAN: Continue same dose of Hydrea as above - Continue oral iron supplementation with ferrous sulfate daily  ***  - INCREASE vitamin B12 supplement to 1000 mcg daily ***  - Labs in 4 months to include iron and B12 panels   3.  Stage Ia grade 1 endometrioid adenocarcinoma - Status post robotic staging on 12/09/2019. - MMR normal. - Due to low risk of  recurrence, no adjuvant therapy was recommended. - Seen by GYN in June 2023 due to isolated episode of vaginal bleeding, no concerns noted per GYN note *** - No pelvic pain, B symptoms, or abnormal vaginal bleeding at this time   ***  - PLAN: Continue follow-up with GYN every 6 months for 5 years.   PLAN SUMMARY: >> Labs in 4 months (CBC/D, CMP, LDH, B12, MMA, ferritin, iron/TIBC) >> Office visit 1 week after labs    I discussed the assessment and treatment plan with the patient. The patient was provided an opportunity to ask questions and all were answered. The patient agreed with the plan and demonstrated an understanding of the instructions.   The patient was advised to call back or seek an in-person evaluation if the symptoms worsen or if the condition fails to improve as anticipated.  I provided *** minutes of non-face-to-face time during this encounter.   Carnella Guadalajara, PA-C ***

## 2022-09-14 ENCOUNTER — Inpatient Hospital Stay: Payer: Medicare Other | Attending: Physician Assistant | Admitting: Physician Assistant

## 2022-09-14 ENCOUNTER — Ambulatory Visit (INDEPENDENT_AMBULATORY_CARE_PROVIDER_SITE_OTHER): Payer: Medicare Other | Admitting: Adult Health

## 2022-09-14 ENCOUNTER — Other Ambulatory Visit: Payer: Self-pay

## 2022-09-14 ENCOUNTER — Encounter: Payer: Self-pay | Admitting: Adult Health

## 2022-09-14 VITALS — BP 146/76 | HR 72 | Ht 64.0 in | Wt 174.5 lb

## 2022-09-14 DIAGNOSIS — C541 Malignant neoplasm of endometrium: Secondary | ICD-10-CM

## 2022-09-14 DIAGNOSIS — Z124 Encounter for screening for malignant neoplasm of cervix: Secondary | ICD-10-CM

## 2022-09-14 DIAGNOSIS — D649 Anemia, unspecified: Secondary | ICD-10-CM

## 2022-09-14 DIAGNOSIS — E611 Iron deficiency: Secondary | ICD-10-CM

## 2022-09-14 DIAGNOSIS — D75839 Thrombocytosis, unspecified: Secondary | ICD-10-CM

## 2022-09-14 DIAGNOSIS — D539 Nutritional anemia, unspecified: Secondary | ICD-10-CM

## 2022-09-14 DIAGNOSIS — D7589 Other specified diseases of blood and blood-forming organs: Secondary | ICD-10-CM

## 2022-09-14 DIAGNOSIS — E538 Deficiency of other specified B group vitamins: Secondary | ICD-10-CM

## 2022-09-14 DIAGNOSIS — Z01419 Encounter for gynecological examination (general) (routine) without abnormal findings: Secondary | ICD-10-CM | POA: Insufficient documentation

## 2022-09-14 NOTE — Progress Notes (Signed)
  Subjective:     [name], female   DOB: October 28, 1946, 76 y.o.   MRN: [id number]  HPI Heather Barber is a 76 year old white female, widowed, with a history of stage IA grade 1 endometrioid endometrial adenocarcinoma, s/p robotic staging on 12/09/19, she is in today for pelvic exam.  She gets pelvic exams every 6 months for 5 years.   PCP is Dr Tressie Stalker  Review of Systems Denies any vaginal bleeding   Reviewed past medical,surgical, social and family history. Reviewed medications and allergies.  Objective:   Physical Exam BP (!) 146/76 (BP Location: Left Arm, Patient Position: Sitting, Cuff Size: Normal)   Pulse 72   Ht '5\' 4"'$  (1.626 m)   Wt 174 lb 8 oz (79.2 kg)   BMI 29.95 kg/m     Pelvic: external genitalia is normal in appearance no lesions, vagina: pale with loss of rugae, no lesions seen,urethra has no lesions or masses noted, cervix and uterus are absent, adnexa: no masses or tenderness noted. Bladder is non tender and no masses felt.   No masses felt on rectal exam. No lymph nodes felt in groin.  Fall risk is low  Upstream - 09/14/22 1607       Pregnancy Intention Screening   Does the patient want to become pregnant in the next year? N/A    Does the patient's partner want to become pregnant in the next year? N/A    Would the patient like to discuss contraceptive options today? N/A      Contraception Wrap Up   Current Method --   sp hsyt   End Method --   sp hyst   Contraception Counseling Provided No            Examination chaperoned by Dewitt Hoes RN  Assessment:     1. Endometrial cancer, grade I ([place]) No lesions seen  No bleeding  Pelvic exam in 6 months   2. Visit for pelvic exam     Plan:     Return in 6 months for pelvic exam

## 2022-09-19 ENCOUNTER — Encounter: Payer: Self-pay | Admitting: Cardiology

## 2022-09-19 ENCOUNTER — Ambulatory Visit: Payer: Medicare Other | Attending: Cardiology | Admitting: Cardiology

## 2022-09-19 VITALS — BP 136/68 | HR 72 | Ht 64.0 in | Wt 175.0 lb

## 2022-09-19 DIAGNOSIS — I5022 Chronic systolic (congestive) heart failure: Secondary | ICD-10-CM | POA: Diagnosis present

## 2022-09-19 DIAGNOSIS — I340 Nonrheumatic mitral (valve) insufficiency: Secondary | ICD-10-CM | POA: Diagnosis present

## 2022-09-19 DIAGNOSIS — I4891 Unspecified atrial fibrillation: Secondary | ICD-10-CM | POA: Diagnosis present

## 2022-09-19 NOTE — Progress Notes (Signed)
Clinical Summary Heather Barber is a 76 y.o.female seen today for follow up of the following medical problems.    1.Afib vs atypical aflutter with variable conduction - new diagnosis during 02/2021 admission - difficult to rate control, had TEE/DCCV initially but quickly had recurrent arrhythmia - started on amio, converted to SR -  TSH was elevated to 28 in 11/2021 which led to dose reduction of Amiodarone to 100 mg daily. She was also started on Synthroid by her PCP   - recent palpitations - compliant with meds - no bleeding on eliquis   2. HFimEF - new diagnosis during 02/2021 in setting of afib with RVR - 02/2021 echo LVEF 30-35% -likely tachy mediated CM  05/2021 echo: LVEF 55-60% - no SOB/DOE, no edema   3. Mitral regurgitation - 05/2021 mod MR, mod TR   4. Hyopothyroidism - issues developed after starting amiodarone 06/2022 TSH 21, on replacement - upcoming appt with pcp for recheck      Past Medical History:  Diagnosis Date   (HFpEF) heart failure with preserved ejection fraction ([name])    a. EF previously 30-35% in 02/2021 in the setting of afib w/RVR --> normalized by repeat imaging in 05/2021.   Anxiety    Atrial flutter ([name])    a. s/p DCCV in 02/2021   Atrial septal defect    Surgical repair in 1994   Cardiomyopathy [facility])    Chest pain    Normal coronary angiography in 1994 and 2011   Dizziness    Endometrial cancer, grade I ([name]) 10/09/2019   Essential thrombocythemia ([name]) 06/06/2016   GERD (gastroesophageal reflux disease)    History of kidney stones    Hyperlipidemia    Hypertension    LBBB (left bundle Keneshia Tena block) 2011   Mitral regurgitation    Palpitations    Persistent atrial fibrillation ([name])    PONV (postoperative nausea and vomiting)    Syncope    Tricuspid regurgitation    Type 2 diabetes mellitus with other specified complication ([name]) 99/83/3825   Ventricular tachycardia ([name])    Exercise induced     Allergies  Allergen  Reactions   Codeine Nausea And Vomiting   Morphine Other (See Comments)    GI symptoms     Current Outpatient Medications  Medication Sig Dispense Refill   Accu-Chek Softclix Lancets lancets 1 each by Other route daily at 12 noon. Use as instructed 90 each 3   amiodarone (PACERONE) 200 MG tablet TAKE 1/2 TABLET BY MOUTH DAILY 45 tablet 3   apixaban (ELIQUIS) 5 MG TABS tablet Take 1 tablet (5 mg total) by mouth 2 (two) times daily. 60 tablet 5   atorvastatin (LIPITOR) 40 MG tablet Take 1 tablet (40 mg total) by mouth at bedtime. 90 tablet 3   cephALEXin (KEFLEX) 500 MG capsule Take 1 capsule (500 mg total) by mouth 2 (two) times daily. (Patient not taking: Reported on 09/14/2022) 14 capsule 0   ENTRESTO 24-26 MG TAKE 1 TABLET BY MOUTH TWICE DAILY 60 tablet 2   fenofibrate 160 MG tablet TAKE 1 TABLET BY MOUTH DAILY 90 tablet 1   glucose blood (ACCU-CHEK GUIDE) test strip 1 each by Other route daily. Use as instructed 90 each 3   hydroxyurea (HYDREA) 500 MG capsule Take 1 capsule (500 mg total) by mouth 2 (two) times daily. 180 capsule 2   ketoconazole (NIZORAL) 2 % cream Apply 1 application topically 2 (two) times daily. 60 g 0  levothyroxine (SYNTHROID) 75 MCG tablet Take 1 tablet (75 mcg total) by mouth daily. (NEEDS TO BE SEEN BEFORE NEXT REFILL) 30 tablet 6   metoprolol succinate (TOPROL-XL) 100 MG 24 hr tablet TAKE 1 TABLET BY MOUTH TWICE DAILY WITH A MEAL 180 tablet 2   Multiple Vitamins-Minerals (MULTIVITAMIN WOMEN 50+) TABS Take by mouth daily.     naproxen sodium (ALEVE) 220 MG tablet Take 220 mg by mouth as needed.     omeprazole (PRILOSEC) 20 MG capsule Take 1 capsule (20 mg total) by mouth daily. 90 capsule 3   sulfamethoxazole-trimethoprim (BACTRIM DS) 800-160 MG tablet Take 1 tablet by mouth 2 (two) times daily. (Patient not taking: Reported on 09/14/2022) 10 tablet 0   No current facility-administered medications for this visit.     Past Surgical History:  Procedure  Laterality Date   APPENDECTOMY     ASD [name]   BREAST EXCISIONAL BIOPSY     Right and left   BREAST SURGERY     right and left breast-benign   CARDIAC CATHETERIZATION  12/24/2009   [name]    CARDIOVERSION N/A 03/04/2021   Procedure: CARDIOVERSION;  Surgeon: [name], MD;  Location: [facility];  Service: Endoscopy;  Laterality: N/A;   CATARACT EXTRACTION W/PHACO Left 03/11/2013   Procedure: CATARACT EXTRACTION PHACO AND INTRAOCULAR LENS PLACEMENT (IOC);  Surgeon: [name], MD;  Location: [facility];  Service: Ophthalmology;  Laterality: Left;  CDE:  12.20   HYSTEROSCOPY WITH D & C N/A 11/11/2019   Procedure: DILATATION AND CURETTAGE /HYSTEROSCOPY;  Surgeon: [name], MD;  Location: [facility];  Service: Gynecology;  Laterality: N/A;   POLYPECTOMY N/A 11/11/2019   Procedure: POLYPECTOMY(ENDOMETRIAL POLYP);  Surgeon: [name], MD;  Location: [facility];  Service: Gynecology;  Laterality: N/A;   ROBOTIC ASSISTED LAPAROSCOPIC HYSTERECTOMY AND SALPINGECTOMY Bilateral 12/09/2019   Procedure: XI ROBOTIC ASSISTED LAPAROSCOPIC HYSTERECTOMY BILATERAL SALPINGOOOPHORECTOMY;  Surgeon: [name], MD;  Location: [facility];  Service: Gynecology;  Laterality: Bilateral;   SENTINEL NODE BIOPSY N/A 12/09/2019   Procedure: SENTINEL LYMPH  NODE BIOPSY;  Surgeon: [name], MD;  Location: [facility];  Service: Gynecology;  Laterality: N/A;   TEE WITHOUT CARDIOVERSION N/A 03/04/2021   Procedure: TRANSESOPHAGEAL ECHOCARDIOGRAM (TEE);  Surgeon: [name], MD;  Location: [facility];  Service: Endoscopy;  Laterality: N/A;   TONSILLECTOMY     TUBAL LIGATION     Bilateral     Allergies  Allergen Reactions   Codeine Nausea And Vomiting   Morphine Other (See Comments)    GI symptoms      Family History  Problem Relation Age of Onset   Cancer Mother        female   Heart failure Father    Stroke Sister    Cancer Sister        breast   Coronary artery disease Neg  Hx      Social History Ms. Santoyo reports that she has never smoked. She has never used smokeless tobacco. Ms. Alviar reports no history of alcohol use.   Review of Systems CONSTITUTIONAL: No weight loss, fever, chills, weakness or fatigue.  HEENT: Eyes: No visual loss, blurred vision, double vision or yellow sclerae.No hearing loss, sneezing, congestion, runny nose or sore throat.  SKIN: No rash or itching.  CARDIOVASCULAR: per hpi RESPIRATORY: No shortness of breath, cough or sputum.  GASTROINTESTINAL: No anorexia, nausea, vomiting or diarrhea. No abdominal pain or  blood.  GENITOURINARY: No burning on urination, no polyuria NEUROLOGICAL: No headache, dizziness, syncope, paralysis, ataxia, numbness or tingling in the extremities. No change in bowel or bladder control.  MUSCULOSKELETAL: No muscle, back pain, joint pain or stiffness.  LYMPHATICS: No enlarged nodes. No history of splenectomy.  PSYCHIATRIC: No history of depression or anxiety.  ENDOCRINOLOGIC: No reports of sweating, cold or heat intolerance. No polyuria or polydipsia.  Marland Kitchen   Physical Examination Today's Vitals   09/19/22 1518  BP: 136/68  Pulse: 72  Weight: 175 lb (79.4 kg)  Height: '5\' 4"'$  (1.626 m)   Body mass index is 30.04 kg/m.  Gen: resting comfortably, no acute distress HEENT: no scleral icterus, pupils equal round and reactive, no palptable cervical adenopathy,  CV: RRR, 2/6 systoilc murmur apex, no jvd Resp: Clear to auscultation bilaterally GI: abdomen is soft, non-tender, non-distended, normal bowel sounds, no hepatosplenomegaly MSK: extremities are warm, no edema.  Skin: warm, no rash Neuro:  no focal deficits Psych: appropriate affect     Assessment and Plan  1.Paroxsymal afib vs atypical aflutter - no symptoms, continue current meds. Has been maintaining SR - follow thyroid on amio, we had previously lowered dose  2. HFimpEF - history of tachy mediated CM, LVEF normalized with control of  her arrhythmias - no symptoms - remains on entresto, if becomes expensive over time would be reasonable to transition to ARB  3.Mitral regurgitation - repeat echo 8/ 2024     [name], M.D.

## 2022-09-19 NOTE — Patient Instructions (Signed)
Medication Instructions:  Your physician recommends that you continue on your current medications as directed. Please refer to the Current Medication list given to you today.   Labwork: None  Testing/Procedures: None  Follow-Up: Follow up with Dr. Harl Bowie in 4 months.   Any Other Special Instructions Will Be Listed Below (If Applicable).     If you need a refill on your cardiac medications before your next appointment, please call your pharmacy.

## 2022-09-29 ENCOUNTER — Encounter: Payer: Self-pay | Admitting: Nurse Practitioner

## 2022-09-29 ENCOUNTER — Ambulatory Visit (INDEPENDENT_AMBULATORY_CARE_PROVIDER_SITE_OTHER): Payer: Medicare Other | Admitting: Nurse Practitioner

## 2022-09-29 VITALS — BP 132/73 | HR 75 | Temp 98.7°F | Ht 64.0 in | Wt 174.0 lb

## 2022-09-29 DIAGNOSIS — R300 Dysuria: Secondary | ICD-10-CM | POA: Diagnosis not present

## 2022-09-29 LAB — URINALYSIS
Bilirubin, UA: NEGATIVE
Glucose, UA: NEGATIVE
Nitrite, UA: NEGATIVE
Specific Gravity, UA: >1.03 — ABNORMAL HIGH (ref 1.005–1.030)
Urobilinogen, Ur: 1 mg/dL (ref 0.2–1.0)
pH, UA: 5.5 (ref 5.0–7.5)

## 2022-09-29 MED ORDER — CEPHALEXIN 500 MG PO CAPS: 500.0000 mg | ORAL_CAPSULE | Freq: Two times a day (BID) | ORAL | 0 refills | Status: DC

## 2022-09-29 NOTE — Patient Instructions (Signed)
Urinary Tract Infection, Adult A urinary tract infection (UTI) is an infection of any part of the urinary tract. The urinary tract includes: The kidneys. The ureters. The bladder. The urethra. These organs make, store, and get rid of pee (urine) in the body. What are the causes? This infection is caused by germs (bacteria) in your genital area. These germs grow and cause swelling (inflammation) of your urinary tract. What increases the risk? The following factors may make you more likely to develop this condition: Using a small, thin tube (catheter) to drain pee. Not being able to control when you pee or poop (incontinence). Being female. If you are female, these things can increase the risk: Using these methods to prevent pregnancy: A medicine that kills sperm (spermicide). A device that blocks sperm (diaphragm). Having low levels of a female hormone (estrogen). Being pregnant. You are more likely to develop this condition if: You have genes that add to your risk. You are sexually active. You take antibiotic medicines. You have trouble peeing because of: A prostate that is bigger than normal, if you are female. A blockage in the part of your body that drains pee from the bladder. A kidney stone. A nerve condition that affects your bladder. Not getting enough to drink. Not peeing often enough. You have other conditions, such as: Diabetes. A weak disease-fighting system (immune system). Sickle cell disease. Gout. Injury of the spine. What are the signs or symptoms? Symptoms of this condition include: Needing to pee right away. Peeing small amounts often. Pain or burning when peeing. Blood in the pee. Pee that smells bad or not like normal. Trouble peeing. Pee that is cloudy. Fluid coming from the vagina, if you are female. Pain in the belly or lower back. Other symptoms include: Vomiting. Not feeling hungry. Feeling mixed up (confused). This may be the first symptom in  older adults. Being tired and grouchy (irritable). A fever. Watery poop (diarrhea). How is this treated? Taking antibiotic medicine. Taking other medicines. Drinking enough water. In some cases, you may need to see a specialist. Follow these instructions at home:  Medicines Take over-the-counter and prescription medicines only as told by your doctor. If you were prescribed an antibiotic medicine, take it as told by your doctor. Do not stop taking it even if you start to feel better. General instructions Make sure you: Pee until your bladder is empty. Do not hold pee for a long time. Empty your bladder after sex. Wipe from front to back after peeing or pooping if you are a female. Use each tissue one time when you wipe. Drink enough fluid to keep your pee pale yellow. Keep all follow-up visits. Contact a doctor if: You do not get better after 1-2 days. Your symptoms go away and then come back. Get help right away if: You have very bad back pain. You have very bad pain in your lower belly. You have a fever. You have chills. You feeling like you will vomit or you vomit. Summary A urinary tract infection (UTI) is an infection of any part of the urinary tract. This condition is caused by germs in your genital area. There are many risk factors for a UTI. Treatment includes antibiotic medicines. Drink enough fluid to keep your pee pale yellow. This information is not intended to replace advice given to you by your health care provider. Make sure you discuss any questions you have with your health care provider. Document Revised: 05/28/2020 Document Reviewed: 05/28/2020 Elsevier Patient Education    2023 Elsevier Inc.  

## 2022-09-29 NOTE — Progress Notes (Signed)
Acute Office Visit  Subjective:     [name], female    DOB: 1946-06-05, 76 y.o.   MRN: [id number]  Chief Complaint  Patient presents with   Dysuria    Dysuria  This is a new problem. Episode onset: in the past 4 days. The problem has been unchanged. The quality of the pain is described as burning. There has been no fever. Associated symptoms include frequency and urgency. Pertinent negatives include no chills, discharge, flank pain, hesitancy, nausea or vomiting. She has tried nothing for the symptoms. Her past medical history is significant for recurrent UTIs.     Review of Systems  Constitutional: Negative.  Negative for chills and fever.  HENT: Negative.    Cardiovascular: Negative.   Gastrointestinal:  Negative for nausea and vomiting.  Genitourinary:  Positive for dysuria, frequency and urgency. Negative for flank pain and hesitancy.  Skin: Negative.  Negative for rash.  All other systems reviewed and are negative.       Objective:    BP 132/73   Pulse 75   Temp 98.7 F (37.1 C)   Ht '5\' 4"'$  (1.626 m)   Wt 174 lb (78.9 kg)   SpO2 95%   BMI 29.87 kg/m  Wt Readings from Last 3 Encounters:  09/29/22 174 lb (78.9 kg)  09/19/22 175 lb (79.4 kg)  09/14/22 174 lb 8 oz (79.2 kg)      Physical Exam Vitals and nursing note reviewed.  Constitutional:      Appearance: Normal appearance.  HENT:     Right Ear: External ear normal.     Left Ear: External ear normal.     Nose: Nose normal.  Eyes:     Conjunctiva/sclera: Conjunctivae normal.  Cardiovascular:     Rate and Rhythm: Normal rate.     Pulses: Normal pulses.     Heart sounds: Normal heart sounds.  Pulmonary:     Effort: Pulmonary effort is normal.     Breath sounds: Normal breath sounds.  Abdominal:     General: Bowel sounds are normal.     Tenderness: There is no abdominal tenderness. There is no right CVA tenderness or left CVA tenderness.  Skin:    General: Skin is warm.      Findings: No erythema or rash.  Neurological:     Mental Status: She is alert and oriented to person, place, and time.     No results found for any visits on 09/29/22.      Assessment & Plan:  Patient presents with dysuria, burning and frequency for the past 4 days.  Urinalysis positive for leukocytes, protein and blood. UTI  vs  interstitial cystitis -Cultures completed results pending -pyridium for pain -Keflex Precaution and education provided -All questions answered -follow up with unresolved symptoms  Problem List Items Addressed This Visit   None Visit Diagnoses     Dysuria    -  Primary   Relevant Medications   cephALEXin (KEFLEX) 500 MG capsule   Other Relevant Orders   Urinalysis   Urine Culture       Meds ordered this encounter  Medications   cephALEXin (KEFLEX) 500 MG capsule    Sig: Take 1 capsule (500 mg total) by mouth 2 (two) times daily.    Dispense:  14 capsule    Refill:  0    Order Specific Question:   Supervising Provider    Answer:   Claretta Fraise [phone]  Return if symptoms worsen or fail to improve.  Ivy Lynn, NP

## 2022-10-03 LAB — URINE CULTURE

## 2022-10-05 ENCOUNTER — Other Ambulatory Visit: Payer: Self-pay | Admitting: Nurse Practitioner

## 2022-10-05 DIAGNOSIS — N3000 Acute cystitis without hematuria: Secondary | ICD-10-CM

## 2022-10-05 MED ORDER — AMOXICILLIN-POT CLAVULANATE 875-125 MG PO TABS: 1.0000 | ORAL_TABLET | Freq: Two times a day (BID) | ORAL | 0 refills | Status: DC

## 2022-10-11 ENCOUNTER — Other Ambulatory Visit: Payer: Self-pay | Admitting: Physician Assistant

## 2022-10-12 ENCOUNTER — Encounter: Payer: Self-pay | Admitting: Family Medicine

## 2022-10-12 ENCOUNTER — Other Ambulatory Visit: Payer: Self-pay | Admitting: Family Medicine

## 2022-10-12 ENCOUNTER — Ambulatory Visit (INDEPENDENT_AMBULATORY_CARE_PROVIDER_SITE_OTHER): Payer: Medicare Other | Admitting: Family Medicine

## 2022-10-12 VITALS — BP 136/67 | HR 66 | Ht 64.0 in | Wt 178.0 lb

## 2022-10-12 DIAGNOSIS — I10 Essential (primary) hypertension: Secondary | ICD-10-CM

## 2022-10-12 DIAGNOSIS — E782 Mixed hyperlipidemia: Secondary | ICD-10-CM | POA: Diagnosis not present

## 2022-10-12 DIAGNOSIS — Z23 Encounter for immunization: Secondary | ICD-10-CM

## 2022-10-12 DIAGNOSIS — E1169 Type 2 diabetes mellitus with other specified complication: Secondary | ICD-10-CM | POA: Diagnosis not present

## 2022-10-12 DIAGNOSIS — E781 Pure hyperglyceridemia: Secondary | ICD-10-CM

## 2022-10-12 DIAGNOSIS — E039 Hypothyroidism, unspecified: Secondary | ICD-10-CM

## 2022-10-12 LAB — BAYER DCA HB A1C WAIVED: HB A1C (BAYER DCA - WAIVED): 8.4 % — ABNORMAL HIGH (ref 4.8–5.6)

## 2022-10-12 MED ORDER — METFORMIN HCL 500 MG PO TABS: 500.0000 mg | ORAL_TABLET | Freq: Two times a day (BID) | ORAL | 3 refills | Status: DC

## 2022-10-12 NOTE — Progress Notes (Signed)
BP 136/67   Pulse 66   Ht _0  (1.626 m)   Wt 178 lb (80.7 kg)   SpO2 95%   BMI 30.55 kg/m    Subjective:   [name], female    DOB: 1946/06/18, 76 y.o.   MRN: [id number]  HPI: Heather Barber is a 76 y.o. female presenting on 10/12/2022 for Medical Management of Chronic Issues and Diabetes   HPI Type 2 diabetes mellitus Patient comes in today for recheck of his diabetes. Patient has been currently taking no medicine currently, has been diet controlled but A1c is up at 8.4 today. Patient is currently on an ACE inhibitor/ARB. Patient has seen an ophthalmologist this year. Patient denies any issues with their feet. The symptom started onset as an adult hypertension and hyperlipidemia ARE RELATED TO DM   Hypertension Patient is currently on metoprolol and Entresto and amiodarone, sees cardiology as well, and their blood pressure today is 136/67. Patient denies any lightheadedness or dizziness. Patient denies headaches, blurred vision, chest pains, shortness of breath, or weakness. Denies any side effects from medication and is content with current medication.   Hyperlipidemia and hypertriglyceridemia Patient is coming in for recheck of his hyperlipidemia. The patient is currently taking fenofibrate and Lipitor. They deny any issues with myalgias or history of liver damage from it. They deny any focal numbness or weakness or chest pain.   Relevant past medical, surgical, family and social history reviewed and updated as indicated. Interim medical history since our last visit reviewed. Allergies and medications reviewed and updated.  Review of Systems  Constitutional:  Negative for chills and fever.  Eyes:  Negative for visual disturbance.  Respiratory:  Negative for chest tightness and shortness of breath.   Cardiovascular:  Negative for chest pain and leg swelling.  Genitourinary:  Negative for difficulty urinating and dysuria.  Musculoskeletal:  Negative for back  pain and gait problem.  Skin:  Negative for rash.  Neurological:  Negative for light-headedness and headaches.  Psychiatric/Behavioral:  Negative for agitation and behavioral problems.   All other systems reviewed and are negative.   Per HPI unless specifically indicated above   Allergies as of 10/12/2022       Reactions   Codeine Nausea And Vomiting   Morphine Other (See Comments)   GI symptoms        Medication List        Accurate as of October 12, 2022  4:21 PM. If you have any questions, ask your nurse or doctor.          STOP taking these medications    amoxicillin-clavulanate 875-125 MG tablet Commonly known as: AUGMENTIN Stopped by: Fransisca Kaufmann Luther Newhouse, MD   sulfamethoxazole-trimethoprim 800-160 MG tablet Commonly known as: Bactrim DS Stopped by: Fransisca Kaufmann Annielee Jemmott, MD       TAKE these medications    Accu-Chek Guide test strip Generic drug: glucose blood 1 each by Other route daily. Use as instructed   Accu-Chek Softclix Lancets lancets 1 each by Other route daily at 12 noon. Use as instructed   amiodarone 200 MG tablet Commonly known as: PACERONE TAKE 1/2 TABLET BY MOUTH DAILY   apixaban 5 MG Tabs tablet Commonly known as: ELIQUIS Take 1 tablet (5 mg total) by mouth 2 (two) times daily.   atorvastatin 40 MG tablet Commonly known as: LIPITOR Take 1 tablet (40 mg total) by mouth at bedtime.   Entresto 24-26 MG Generic drug: sacubitril-valsartan TAKE 1 TABLET  BY MOUTH TWICE A DAY   fenofibrate 160 MG tablet TAKE 1 TABLET BY MOUTH DAILY   hydroxyurea 500 MG capsule Commonly known as: HYDREA Take 1 capsule (500 mg total) by mouth 2 (two) times daily.   ketoconazole 2 % cream Commonly known as: NIZORAL Apply 1 application topically 2 (two) times daily.   levothyroxine 75 MCG tablet Commonly known as: SYNTHROID Take 1 tablet (75 mcg total) by mouth daily. (NEEDS TO BE SEEN BEFORE NEXT REFILL)   metFORMIN 500 MG tablet Commonly  known as: GLUCOPHAGE Take 1 tablet (500 mg total) by mouth 2 (two) times daily with a meal. Started by: Fransisca Kaufmann Marquest Gunkel, MD   metoprolol succinate 100 MG 24 hr tablet Commonly known as: TOPROL-XL TAKE 1 TABLET BY MOUTH TWICE DAILY WITH A MEAL   Multivitamin Women 50+ Tabs Take by mouth daily.   naproxen sodium 220 MG tablet Commonly known as: ALEVE Take 220 mg by mouth as needed.   omeprazole 20 MG capsule Commonly known as: PRILOSEC Take 1 capsule (20 mg total) by mouth daily.         Objective:   BP 136/67   Pulse 66   Ht _0  (1.626 m)   Wt 178 lb (80.7 kg)   SpO2 95%   BMI 30.55 kg/m   Wt Readings from Last 3 Encounters:  10/12/22 178 lb (80.7 kg)  09/29/22 174 lb (78.9 kg)  09/19/22 175 lb (79.4 kg)    Physical Exam Vitals and nursing note reviewed.  Constitutional:      General: She is not in acute distress.    Appearance: She is well-developed. She is not diaphoretic.  Eyes:     Conjunctiva/sclera: Conjunctivae normal.  Cardiovascular:     Rate and Rhythm: Normal rate and regular rhythm.     Heart sounds: Normal heart sounds. No murmur heard. Pulmonary:     Effort: Pulmonary effort is normal. No respiratory distress.     Breath sounds: Normal breath sounds. No wheezing.  Musculoskeletal:        General: No swelling or tenderness. Normal range of motion.  Skin:    General: Skin is warm and dry.     Findings: No rash.  Neurological:     Mental Status: She is alert and oriented to person, place, and time.     Coordination: Coordination normal.  Psychiatric:        Behavior: Behavior normal.       Assessment & Plan:   Problem List Items Addressed This Visit       Cardiovascular and Mediastinum   Hypertension   Relevant Orders   CBC with Differential/Platelet   CMP14+EGFR   Lipid panel   TSH   Bayer DCA Hb A1c Waived     Endocrine   Type 2 diabetes mellitus with other specified complication ([name]) - Primary   Relevant Medications    metFORMIN (GLUCOPHAGE) 500 MG tablet   Other Relevant Orders   CBC with Differential/Platelet   CMP14+EGFR   Lipid panel   TSH   Bayer DCA Hb A1c Waived   Microalbumin / creatinine urine ratio   Hypothyroidism     Other   Hyperlipidemia   Relevant Orders   CBC with Differential/Platelet   CMP14+EGFR   Lipid panel   TSH   Bayer DCA Hb A1c Waived   Hypertriglyceridemia    A1c up to 8.4, will start metformin twice a day at 500 mg.  Blood pressure looks good, no other  changes, will check blood work. Follow up plan: Return in about 3 months (around 01/11/2023), or if symptoms worsen or fail to improve, for Diabetes recheck.  Counseling provided for all of the vaccine components Orders Placed This Encounter  Procedures   Tdap vaccine greater than or equal to 7yo IM   CBC with Differential/Platelet   CMP14+EGFR   Lipid panel   TSH   Bayer DCA Hb A1c Waived   Microalbumin / creatinine urine ratio    Caryl Pina, MD [facility] 10/12/2022, 4:21 PM

## 2022-10-13 LAB — CBC WITH DIFFERENTIAL/PLATELET
Basophils Absolute: 0.1 10*3/uL (ref 0.0–0.2)
Basos: 1 %
EOS (ABSOLUTE): 0.1 10*3/uL (ref 0.0–0.4)
Eos: 2 %
Hematocrit: 34.2 % (ref 34.0–46.6)
Hemoglobin: 12.2 g/dL (ref 11.1–15.9)
Immature Grans (Abs): 0 10*3/uL (ref 0.0–0.1)
Immature Granulocytes: 0 %
Lymphocytes Absolute: 2.2 10*3/uL (ref 0.7–3.1)
Lymphs: 34 %
MCH: 35.2 pg — ABNORMAL HIGH (ref 26.6–33.0)
MCHC: 35.7 g/dL (ref 31.5–35.7)
MCV: 99 fL — ABNORMAL HIGH (ref 79–97)
Monocytes Absolute: 0.5 10*3/uL (ref 0.1–0.9)
Monocytes: 7 %
Neutrophils Absolute: 3.8 10*3/uL (ref 1.4–7.0)
Neutrophils: 56 %
Platelets: 355 10*3/uL (ref 150–450)
RBC: 3.47 x10E6/uL — ABNORMAL LOW (ref 3.77–5.28)
RDW: 12 % (ref 11.7–15.4)
WBC: 6.7 10*3/uL (ref 3.4–10.8)

## 2022-10-13 LAB — CMP14+EGFR
ALT: 13 IU/L (ref 0–32)
AST: 14 IU/L (ref 0–40)
Albumin/Globulin Ratio: 2.1 (ref 1.2–2.2)
Albumin: 4.6 g/dL (ref 3.8–4.8)
Alkaline Phosphatase: 62 IU/L (ref 44–121)
BUN/Creatinine Ratio: 18 (ref 12–28)
BUN: 16 mg/dL (ref 8–27)
Bilirubin Total: 0.3 mg/dL (ref 0.0–1.2)
CO2: 27 mmol/L (ref 20–29)
Calcium: 10 mg/dL (ref 8.7–10.3)
Chloride: 100 mmol/L (ref 96–106)
Creatinine, Ser: 0.91 mg/dL (ref 0.57–1.00)
Globulin, Total: 2.2 g/dL (ref 1.5–4.5)
Glucose: 121 mg/dL — ABNORMAL HIGH (ref 70–99)
Potassium: 5.4 mmol/L — ABNORMAL HIGH (ref 3.5–5.2)
Sodium: 139 mmol/L (ref 134–144)
Total Protein: 6.8 g/dL (ref 6.0–8.5)
eGFR: 65 mL/min/{1.73_m2} (ref >59)

## 2022-10-13 LAB — LIPID PANEL
Chol/HDL Ratio: 4.2 ratio (ref 0.0–4.4)
Cholesterol, Total: 184 mg/dL (ref 100–199)
HDL: 44 mg/dL (ref >39)
LDL Chol Calc (NIH): 98 mg/dL (ref 0–99)
Triglycerides: 248 mg/dL — ABNORMAL HIGH (ref 0–149)
VLDL Cholesterol Cal: 42 mg/dL — ABNORMAL HIGH (ref 5–40)

## 2022-10-13 LAB — MICROALBUMIN / CREATININE URINE RATIO
Creatinine, Urine: 52.9 mg/dL
Microalb/Creat Ratio: 7 mg/g creat (ref 0–29)
Microalbumin, Urine: 3.8 ug/mL

## 2022-10-13 LAB — TSH: TSH: 2.69 u[IU]/mL (ref 0.450–4.500)

## 2022-10-25 ENCOUNTER — Telehealth: Payer: Self-pay | Admitting: Family Medicine

## 2022-10-26 NOTE — Telephone Encounter (Signed)
Can you assist patient?

## 2022-10-27 NOTE — Telephone Encounter (Signed)
Left message informing patient she will only need to complete highlighted areas on application and return to office. We will complete the rest and get re-enrollment sent off.   Left call back # [phone]

## 2022-11-01 ENCOUNTER — Encounter: Payer: Self-pay | Admitting: Nurse Practitioner

## 2022-11-01 ENCOUNTER — Ambulatory Visit (INDEPENDENT_AMBULATORY_CARE_PROVIDER_SITE_OTHER): Payer: Medicare Other | Admitting: Nurse Practitioner

## 2022-11-01 VITALS — BP 121/68 | HR 82 | Temp 98.8°F | Ht 64.0 in | Wt 172.0 lb

## 2022-11-01 DIAGNOSIS — R300 Dysuria: Secondary | ICD-10-CM

## 2022-11-01 LAB — URINALYSIS
Bilirubin, UA: NEGATIVE
Glucose, UA: NEGATIVE
Nitrite, UA: POSITIVE — AB
Specific Gravity, UA: >1.03 — ABNORMAL HIGH (ref 1.005–1.030)
Urobilinogen, Ur: 1 mg/dL (ref 0.2–1.0)
pH, UA: 5.5 (ref 5.0–7.5)

## 2022-11-01 MED ORDER — CIPROFLOXACIN HCL 250 MG PO TABS: 250.0000 mg | ORAL_TABLET | Freq: Two times a day (BID) | ORAL | 0 refills | Status: AC

## 2022-11-01 NOTE — Patient Instructions (Signed)
Urinary Tract Infection, Adult A urinary tract infection (UTI) is an infection of any part of the urinary tract. The urinary tract includes: The kidneys. The ureters. The bladder. The urethra. These organs make, store, and get rid of pee (urine) in the body. What are the causes? This infection is caused by germs (bacteria) in your genital area. These germs grow and cause swelling (inflammation) of your urinary tract. What increases the risk? The following factors may make you more likely to develop this condition: Using a small, thin tube (catheter) to drain pee. Not being able to control when you pee or poop (incontinence). Being female. If you are female, these things can increase the risk: Using these methods to prevent pregnancy: A medicine that kills sperm (spermicide). A device that blocks sperm (diaphragm). Having low levels of a female hormone (estrogen). Being pregnant. You are more likely to develop this condition if: You have genes that add to your risk. You are sexually active. You take antibiotic medicines. You have trouble peeing because of: A prostate that is bigger than normal, if you are female. A blockage in the part of your body that drains pee from the bladder. A kidney stone. A nerve condition that affects your bladder. Not getting enough to drink. Not peeing often enough. You have other conditions, such as: Diabetes. A weak disease-fighting system (immune system). Sickle cell disease. Gout. Injury of the spine. What are the signs or symptoms? Symptoms of this condition include: Needing to pee right away. Peeing small amounts often. Pain or burning when peeing. Blood in the pee. Pee that smells bad or not like normal. Trouble peeing. Pee that is cloudy. Fluid coming from the vagina, if you are female. Pain in the belly or lower back. Other symptoms include: Vomiting. Not feeling hungry. Feeling mixed up (confused). This may be the first symptom in  older adults. Being tired and grouchy (irritable). A fever. Watery poop (diarrhea). How is this treated? Taking antibiotic medicine. Taking other medicines. Drinking enough water. In some cases, you may need to see a specialist. Follow these instructions at home:  Medicines Take over-the-counter and prescription medicines only as told by your doctor. If you were prescribed an antibiotic medicine, take it as told by your doctor. Do not stop taking it even if you start to feel better. General instructions Make sure you: Pee until your bladder is empty. Do not hold pee for a long time. Empty your bladder after sex. Wipe from front to back after peeing or pooping if you are a female. Use each tissue one time when you wipe. Drink enough fluid to keep your pee pale yellow. Keep all follow-up visits. Contact a doctor if: You do not get better after 1-2 days. Your symptoms go away and then come back. Get help right away if: You have very bad back pain. You have very bad pain in your lower belly. You have a fever. You have chills. You feeling like you will vomit or you vomit. Summary A urinary tract infection (UTI) is an infection of any part of the urinary tract. This condition is caused by germs in your genital area. There are many risk factors for a UTI. Treatment includes antibiotic medicines. Drink enough fluid to keep your pee pale yellow. This information is not intended to replace advice given to you by your health care provider. Make sure you discuss any questions you have with your health care provider. Document Revised: 05/28/2020 Document Reviewed: 05/28/2020 Elsevier Patient Education    2023 Elsevier Inc.  

## 2022-11-01 NOTE — Progress Notes (Signed)
Acute Office Visit  Subjective:     [name], female    DOB: 1946-04-28, 77 y.o.   MRN: [id number]  Chief Complaint  Patient presents with   Dysuria    Urinary Tract Infection  This is a new problem. The current episode started in the past 7 days. The problem occurs every urination. The problem has been unchanged. Quality: fequency. There has been no fever. She is Not sexually active. Associated symptoms include frequency. Pertinent negatives include no chills, flank pain, hematuria, nausea or urgency. She has tried nothing for the symptoms. Her past medical history is significant for recurrent UTIs.    Review of Systems  Constitutional: Negative.  Negative for chills.  HENT: Negative.    Cardiovascular: Negative.   Gastrointestinal:  Negative for nausea.  Genitourinary:  Positive for frequency. Negative for flank pain, hematuria and urgency.  Skin: Negative.  Negative for rash.  Neurological: Negative.   All other systems reviewed and are negative.       Objective:    BP 121/68   Pulse 82   Temp 98.8 F (37.1 C)   Ht '5\' 4"'$  (1.626 m)   Wt 172 lb (78 kg)   SpO2 93%   BMI 29.52 kg/m  Wt Readings from Last 3 Encounters:  11/01/22 172 lb (78 kg)  10/12/22 178 lb (80.7 kg)  09/29/22 174 lb (78.9 kg)      Physical Exam Vitals and nursing note reviewed.  Constitutional:      Appearance: Normal appearance.  HENT:     Head: Normocephalic.     Right Ear: External ear normal.     Left Ear: External ear normal.     Nose: Nose normal.     Mouth/Throat:     Mouth: Mucous membranes are moist.     Pharynx: Oropharynx is clear.  Eyes:     Conjunctiva/sclera: Conjunctivae normal.     Pupils: Pupils are equal, round, and reactive to light.  Cardiovascular:     Rate and Rhythm: Normal rate and regular rhythm.     Pulses: Normal pulses.     Heart sounds: Normal heart sounds.  Pulmonary:     Effort: Pulmonary effort is normal.     Breath sounds:  Normal breath sounds.  Abdominal:     General: Bowel sounds are normal.     Tenderness: There is no abdominal tenderness. There is no right CVA tenderness or left CVA tenderness.  Skin:    General: Skin is warm.     Findings: No erythema or rash.  Neurological:     General: No focal deficit present.     Mental Status: She is alert and oriented to person, place, and time.     Results for orders placed or performed in visit on 11/01/22  Urinalysis  Result Value Ref Range   Specific Gravity, UA >1.030 (H) 1.005 - 1.030   pH, UA 5.5 5.0 - 7.5   Color, UA Yellow Yellow   Appearance Ur Cloudy (A) Clear   Leukocytes,UA 1+ (A) Negative   Protein,UA 3+ (A) Negative/Trace   Glucose, UA Negative Negative   Ketones, UA 1+ (A) Negative   RBC, UA 3+ (A) Negative   Bilirubin, UA Negative Negative   Urobilinogen, Ur 1.0 0.2 - 1.0 mg/dL   Nitrite, UA Positive (A) Negative        Assessment & Plan:  Patient presents with symptoms of dysuria with frequency and foul odor.   UTI  vs  interstitial cystitis -urinalysis completed results pending -pyridium for pain -cipro Precaution and education provided -All questions answered -follow up with unresolved symptoms   Problem List Items Addressed This Visit   None Visit Diagnoses     Dysuria    -  Primary   Relevant Medications   ciprofloxacin (CIPRO) 250 MG tablet   Other Relevant Orders   Urinalysis (Completed)   Urine Culture       Meds ordered this encounter  Medications   ciprofloxacin (CIPRO) 250 MG tablet    Sig: Take 1 tablet (250 mg total) by mouth 2 (two) times daily for 3 days.    Dispense:  6 tablet    Refill:  0    Order Specific Question:   Supervising Provider    Answer:   Claretta Fraise [[id number]]    Return if symptoms worsen or fail to improve, for UTI.  Ivy Lynn, NP

## 2022-11-04 LAB — URINE CULTURE

## 2022-11-05 ENCOUNTER — Other Ambulatory Visit: Payer: Self-pay | Admitting: Nurse Practitioner

## 2022-11-05 DIAGNOSIS — R8279 Other abnormal findings on microbiological examination of urine: Secondary | ICD-10-CM

## 2022-11-05 MED ORDER — AMOXICILLIN-POT CLAVULANATE 875-125 MG PO TABS: 1.0000 | ORAL_TABLET | Freq: Two times a day (BID) | ORAL | 0 refills | Status: DC

## 2022-11-16 ENCOUNTER — Telehealth: Payer: Self-pay | Admitting: Family Medicine

## 2022-11-16 NOTE — Telephone Encounter (Signed)
Left message for patient to call back and schedule Medicare Annual Wellness Visit (AWV) to be completed by video or phone.   Last AWV:  11/14/2021  Please schedule at anytime with Center     Any questions, please contact me at [phone]   Thank you,   [facility] Clinical Support for [place] [facility]. We Are. [facility] ??[phone] or ??[phone]

## 2022-11-20 ENCOUNTER — Telehealth: Payer: Self-pay

## 2022-11-20 ENCOUNTER — Other Ambulatory Visit (HOSPITAL_COMMUNITY): Payer: Self-pay

## 2022-11-20 NOTE — Telephone Encounter (Signed)
Pt previously enrolled for Eliquis PAP. However hasn't met the 3% of income OOP medication costs needed for 2024 enrollment.   Patient has about 20 pills remaining and was looking into samples.   Copay at the pharmacy is $130+

## 2022-12-12 ENCOUNTER — Other Ambulatory Visit (HOSPITAL_COMMUNITY): Payer: Self-pay | Admitting: Family Medicine

## 2022-12-12 ENCOUNTER — Ambulatory Visit (INDEPENDENT_AMBULATORY_CARE_PROVIDER_SITE_OTHER): Payer: Medicare Other | Admitting: Family Medicine

## 2022-12-12 ENCOUNTER — Encounter: Payer: Self-pay | Admitting: Family Medicine

## 2022-12-12 VITALS — BP 138/67 | HR 68 | Temp 98.4°F | Ht 64.0 in | Wt 169.0 lb

## 2022-12-12 DIAGNOSIS — R300 Dysuria: Secondary | ICD-10-CM | POA: Diagnosis not present

## 2022-12-12 DIAGNOSIS — Z1231 Encounter for screening mammogram for malignant neoplasm of breast: Secondary | ICD-10-CM

## 2022-12-12 DIAGNOSIS — N390 Urinary tract infection, site not specified: Secondary | ICD-10-CM

## 2022-12-12 LAB — URINALYSIS, ROUTINE W REFLEX MICROSCOPIC
Bilirubin, UA: NEGATIVE
Glucose, UA: NEGATIVE
Ketones, UA: NEGATIVE
Nitrite, UA: NEGATIVE
Specific Gravity, UA: 1.02 (ref 1.005–1.030)
Urobilinogen, Ur: 0.2 mg/dL (ref 0.2–1.0)
pH, UA: 5.5 (ref 5.0–7.5)

## 2022-12-12 LAB — MICROSCOPIC EXAMINATION
RBC, Urine: >30 /hpf — AB (ref 0–2)
Renal Epithel, UA: NONE SEEN /hpf
WBC, UA: >30 /hpf — AB (ref 0–5)

## 2022-12-12 NOTE — Progress Notes (Signed)
Acute Office Visit  Subjective:     [name], female    DOB: 1946-03-15, 77 y.o.   MRN: [id number]  Chief Complaint  Patient presents with   Dysuria     Pt recently treated for UTI. Received 3 day course of cipro. Then changed to Augmentin for 14 days per Chart Review. States she saw moderate improvement in symptoms, but was never completely healed. States that she feels the urge to go to the bathroom, then will have trickling. Does not feel that she is completely emptying bladder.   Dysuria  This is a recurrent problem. The current episode started more than 1 month ago. The problem occurs intermittently. The problem has been gradually worsening. The quality of the pain is described as aching and burning. The pain is at a severity of 5/10. The pain is moderate. There has been no fever. Associated symptoms include frequency, hesitancy and urgency. Pertinent negatives include no chills, discharge, flank pain, hematuria or sweats. She has tried antibiotics for the symptoms. Her past medical history is significant for kidney stones and recurrent UTIs. There is no history of catheterization or a urological procedure. remote hx of kidney stones  Urinary Tract Infection  This is a recurrent problem. The current episode started more than 1 month ago. The problem occurs intermittently. The problem has been gradually worsening. The quality of the pain is described as aching and burning (pressure). The pain is at a severity of 5/10. The pain is moderate. There has been no fever. She is Not sexually active. History of pyelonephritis: unknown by patient. Associated symptoms include frequency, hesitancy and urgency. Pertinent negatives include no chills, discharge, flank pain, hematuria or sweats. Associated symptoms comments: Denies itching. She has tried antibiotics for the symptoms. The treatment provided mild relief. Her past medical history is significant for kidney stones and recurrent  UTIs. There is no history of catheterization or a urological procedure. remote hx of kidney stones    Review of Systems  Constitutional:  Negative for chills, diaphoresis, fever and malaise/fatigue.  HENT: Negative.    Eyes: Negative.   Respiratory: Negative.    Cardiovascular: Negative.   Genitourinary:  Positive for dysuria, frequency, hesitancy and urgency. Negative for flank pain and hematuria.  Skin: Negative.   All other systems reviewed and are negative.       Objective:    BP (!) 148/71   Pulse 68   Temp 98.4 F (36.9 C)   Ht 5' 4"$  (1.626 m)   Wt 169 lb (76.7 kg)   SpO2 94%   BMI 29.01 kg/m    Physical Exam Constitutional:      General: She is not in acute distress.    Appearance: Normal appearance. She is normal weight. She is not ill-appearing, toxic-appearing or diaphoretic.  Cardiovascular:     Rate and Rhythm: Normal rate.     Pulses: Normal pulses.     Heart sounds: No murmur heard. Pulmonary:     Effort: Pulmonary effort is normal. No respiratory distress.     Breath sounds: Normal breath sounds. No wheezing or rales.  Abdominal:     General: Bowel sounds are normal.  Musculoskeletal:        General: Normal range of motion.  Skin:    General: Skin is warm.  Neurological:     General: No focal deficit present.     Mental Status: She is alert and oriented to person, place, and time. Mental status is at  baseline.  Psychiatric:        Mood and Affect: Mood normal.        Behavior: Behavior normal.        Thought Content: Thought content normal.        Judgment: Judgment normal.       Assessment & Plan:   Problem List Items Addressed This Visit   Heather Barber was seen today for dysuria.  Diagnoses and all orders for this visit:  Dysuria Recurrent UTI -     Urinalysis, Routine w reflex microscopic -     Urine Culture -     Ambulatory referral to Urology Reviewed results of UA with patient today in clinic. Discussed that since she has completed  two courses of antibiotics (cipro and augmentin) in January that we would wait until the cx results to determine to treat for infection. Discussed placing a referral for urology with patient. She is amenable to referral for recurrent UTI infection.  The above assessment and management plan was discussed with the patient. The patient verbalized understanding of and has agreed to the management plan using shared-decision making. Patient is aware to call the clinic if they develop any new symptoms or if symptoms fail to improve or worsen. Patient is aware when to return to the clinic for a follow-up visit. Patient educated on when it is appropriate to go to the emergency department.     Donzetta Kohut, FNP

## 2022-12-12 NOTE — Telephone Encounter (Signed)
Patient hadn't heard anything on patient assistance and wants Rosendo Gros to call her because she needs her medication.

## 2022-12-13 ENCOUNTER — Other Ambulatory Visit (HOSPITAL_COMMUNITY): Payer: Self-pay

## 2022-12-13 NOTE — Telephone Encounter (Signed)
Spoke to patient regarding Eliquis PAP.  Informed her she will need to spend 3% of her income on medication costs. Let patient know she may need to spend around $720.00 based on the income she provided. Patient expressed understanding and says she has a few tablets left. She is ok with picking up at the pharmacy for a few months with the copay being $139 (30 day supply). Patient says she will reach out to office for samples if needed and follow up on future enrollment.  Patient would like a new RX of Eliquis sent to [facility] in [place].   She also mentioned enrollment with [employer] for her Delene Loll, says it will expire soon. Almyra Free, did you originally do this one? I can mail her an application for renewal if needed!

## 2022-12-13 NOTE — Telephone Encounter (Signed)
We are requesting that patient's now go through cardiology to assist with entresto patient assistance She can call or go online to print entresto application, but needs to go through cardiology now.  I think we did [employer] grant for [employer] which she could go online and do herself if needed. But entresto is a high risk med we are deferring to cards  And actually eliquis was last written by cardiology so I would defer both to cardiology.  We have not prescirbed in over 1 yr so I can't send in refills.   Thanks!

## 2022-12-14 ENCOUNTER — Telehealth: Payer: Self-pay | Admitting: Cardiology

## 2022-12-14 NOTE — Telephone Encounter (Signed)
Spoke with pt. Who would like to apply to the [employer] patient assistance. Will mail pt application to complete and bring back with proof of income.

## 2022-12-14 NOTE — Telephone Encounter (Signed)
Pt c/o medication issue:  1. Name of Medication: apixaban (ELIQUIS) 5 MG TABS tablet   2. How are you currently taking this medication (dosage and times per day)? As prescribed  3. Are you having a reaction (difficulty breathing--STAT)? No   4. What is your medication issue? Patient is calling wanting to reinstate in the program that assist with the cost of this medication and sends it to her home. Please advise.

## 2022-12-14 NOTE — Telephone Encounter (Signed)
Informed patient to reach out to cardiology for [facility] assistance. Patient expressed understanding.

## 2022-12-15 ENCOUNTER — Other Ambulatory Visit: Payer: Self-pay | Admitting: Family Medicine

## 2022-12-15 DIAGNOSIS — R300 Dysuria: Secondary | ICD-10-CM

## 2022-12-15 MED ORDER — CEPHALEXIN 500 MG PO CAPS: 500.0000 mg | ORAL_CAPSULE | Freq: Two times a day (BID) | ORAL | 0 refills | Status: AC

## 2022-12-16 LAB — URINE CULTURE

## 2022-12-19 ENCOUNTER — Other Ambulatory Visit: Payer: Self-pay | Admitting: Cardiology

## 2022-12-19 ENCOUNTER — Other Ambulatory Visit: Payer: Self-pay | Admitting: Family Medicine

## 2022-12-19 ENCOUNTER — Telehealth: Payer: Self-pay | Admitting: Family Medicine

## 2022-12-19 NOTE — Patient Instructions (Signed)
Heather Barber , Thank you for taking time to come for your Medicare Wellness Visit. I appreciate your ongoing commitment to your health goals. Please review the following plan we discussed and let me know if I can assist you in the future.   These are the goals we discussed:  Goals      Exercise 3x per week (30 min per time)     Continue to exercise. Continue to work in yard and her flowers.         This is a list of the screening recommended for you and due dates:  Health Maintenance  Topic Date Due   Eye exam for diabetics  Never done   COVID-19 Vaccine (4 - 2023-24 season) 06/30/2022   Flu Shot  01/28/2023*   Hemoglobin A1C  04/13/2023   Yearly kidney function blood test for diabetes  10/13/2023   Yearly kidney health urinalysis for diabetes  10/13/2023   Complete foot exam   10/13/2023   Medicare Annual Wellness Visit  12/21/2023   DTaP/Tdap/Td vaccine (2 - Td or Tdap) 10/12/2032   Pneumonia Vaccine  Completed   DEXA scan (bone density measurement)  Completed   Hepatitis C Screening: USPSTF Recommendation to screen - Ages 89-79 yo.  Completed   Zoster (Shingles) Vaccine  Completed   HPV Vaccine  Aged Out  *Topic was postponed. The date shown is not the original due date.    Advanced directives: Please bring a copy of your health care power of attorney and living will to the office to be added to your chart at your convenience.   Conditions/risks identified: Aim for 30 minutes of exercise or brisk walking, 6-8 glasses of water, and 5 servings of fruits and vegetables each day.   Next appointment: Follow up in one year for your annual wellness visit    Preventive Care 65 Years and Older, Female Preventive care refers to lifestyle choices and visits with your health care provider that can promote health and wellness. What does preventive care include? A yearly physical exam. This is also called an annual well check. Dental exams once or twice a year. Routine eye exams. Ask  your health care provider how often you should have your eyes checked. Personal lifestyle choices, including: Daily care of your teeth and gums. Regular physical activity. Eating a healthy diet. Avoiding tobacco and drug use. Limiting alcohol use. Practicing safe sex. Taking low-dose aspirin every day. Taking vitamin and mineral supplements as recommended by your health care provider. What happens during an annual well check? The services and screenings done by your health care provider during your annual well check will depend on your age, overall health, lifestyle risk factors, and family history of disease. Counseling  Your health care provider may ask you questions about your: Alcohol use. Tobacco use. Drug use. Emotional well-being. Home and relationship well-being. Sexual activity. Eating habits. History of falls. Memory and ability to understand (cognition). Work and work Statistician. Reproductive health. Screening  You may have the following tests or measurements: Height, weight, and BMI. Blood pressure. Lipid and cholesterol levels. These may be checked every 5 years, or more frequently if you are over 87 years old. Skin check. Lung cancer screening. You may have this screening every year starting at age 31 if you have a 30-pack-year history of smoking and currently smoke or have quit within the past 15 years. Fecal occult blood test (FOBT) of the stool. You may have this test every year starting at age  50. Flexible sigmoidoscopy or colonoscopy. You may have a sigmoidoscopy every 5 years or a colonoscopy every 10 years starting at age 70. Hepatitis C blood test. Hepatitis B blood test. Sexually transmitted disease (STD) testing. Diabetes screening. This is done by checking your blood sugar (glucose) after you have not eaten for a while (fasting). You may have this done every 1-3 years. Bone density scan. This is done to screen for osteoporosis. You may have this done  starting at age 55. Mammogram. This may be done every 1-2 years. Talk to your health care provider about how often you should have regular mammograms. Talk with your health care provider about your test results, treatment options, and if necessary, the need for more tests. Vaccines  Your health care provider may recommend certain vaccines, such as: Influenza vaccine. This is recommended every year. Tetanus, diphtheria, and acellular pertussis (Tdap, Td) vaccine. You may need a Td booster every 10 years. Zoster vaccine. You may need this after age 68. Pneumococcal 13-valent conjugate (PCV13) vaccine. One dose is recommended after age 58. Pneumococcal polysaccharide (PPSV23) vaccine. One dose is recommended after age 78. Talk to your health care provider about which screenings and vaccines you need and how often you need them. This information is not intended to replace advice given to you by your health care provider. Make sure you discuss any questions you have with your health care provider. Document Released: 11/12/2015 Document Revised: 07/05/2016 Document Reviewed: 08/17/2015 Elsevier Interactive Patient Education  2017 [employer] Prevention in the Home Falls can cause injuries. They can happen to people of all ages. There are many things you can do to make your home safe and to help prevent falls. What can I do on the outside of my home? Regularly fix the edges of walkways and driveways and fix any cracks. Remove anything that might make you trip as you walk through a door, such as a raised step or threshold. Trim any bushes or trees on the path to your home. Use bright outdoor lighting. Clear any walking paths of anything that might make someone trip, such as rocks or tools. Regularly check to see if handrails are loose or broken. Make sure that both sides of any steps have handrails. Any raised decks and porches should have guardrails on the edges. Have any leaves, snow, or  ice cleared regularly. Use sand or salt on walking paths during winter. Clean up any spills in your garage right away. This includes oil or grease spills. What can I do in the bathroom? Use night lights. Install grab bars by the toilet and in the tub and shower. Do not use towel bars as grab bars. Use non-skid mats or decals in the tub or shower. If you need to sit down in the shower, use a plastic, non-slip stool. Keep the floor dry. Clean up any water that spills on the floor as soon as it happens. Remove soap buildup in the tub or shower regularly. Attach bath mats securely with double-sided non-slip rug tape. Do not have throw rugs and other things on the floor that can make you trip. What can I do in the bedroom? Use night lights. Make sure that you have a light by your bed that is easy to reach. Do not use any sheets or blankets that are too big for your bed. They should not hang down onto the floor. Have a firm chair that has side arms. You can use this for support while you  get dressed. Do not have throw rugs and other things on the floor that can make you trip. What can I do in the kitchen? Clean up any spills right away. Avoid walking on wet floors. Keep items that you use a lot in easy-to-reach places. If you need to reach something above you, use a strong step stool that has a grab bar. Keep electrical cords out of the way. Do not use floor polish or wax that makes floors slippery. If you must use wax, use non-skid floor wax. Do not have throw rugs and other things on the floor that can make you trip. What can I do with my stairs? Do not leave any items on the stairs. Make sure that there are handrails on both sides of the stairs and use them. Fix handrails that are broken or loose. Make sure that handrails are as long as the stairways. Check any carpeting to make sure that it is firmly attached to the stairs. Fix any carpet that is loose or worn. Avoid having throw rugs at  the top or bottom of the stairs. If you do have throw rugs, attach them to the floor with carpet tape. Make sure that you have a light switch at the top of the stairs and the bottom of the stairs. If you do not have them, ask someone to add them for you. What else can I do to help prevent falls? Wear shoes that: Do not have high heels. Have rubber bottoms. Are comfortable and fit you well. Are closed at the toe. Do not wear sandals. If you use a stepladder: Make sure that it is fully opened. Do not climb a closed stepladder. Make sure that both sides of the stepladder are locked into place. Ask someone to hold it for you, if possible. Clearly mark and make sure that you can see: Any grab bars or handrails. First and last steps. Where the edge of each step is. Use tools that help you move around (mobility aids) if they are needed. These include: Canes. Walkers. Scooters. Crutches. Turn on the lights when you go into a dark area. Replace any light bulbs as soon as they burn out. Set up your furniture so you have a clear path. Avoid moving your furniture around. If any of your floors are uneven, fix them. If there are any pets around you, be aware of where they are. Review your medicines with your doctor. Some medicines can make you feel dizzy. This can increase your chance of falling. Ask your doctor what other things that you can do to help prevent falls. This information is not intended to replace advice given to you by your health care provider. Make sure you discuss any questions you have with your health care provider. Document Released: 08/12/2009 Document Revised: 03/23/2016 Document Reviewed: 11/20/2014 Elsevier Interactive Patient Education  2017 [employer].

## 2022-12-19 NOTE — Progress Notes (Signed)
Subjective:   Heather Barber is a 77 y.o. female who presents for Medicare Annual (Subsequent) preventive examination.  Review of Systems    ***       Objective:    There were no vitals filed for this visit. There is no height or weight on file to calculate BMI.     02/13/2022   11:35 AM 11/14/2021    1:26 PM 09/08/2021    1:22 PM 06/06/2021   10:23 AM 02/28/2021   11:50 AM 12/06/2020    2:38 PM 04/13/2020    2:58 PM  Advanced Directives  Does Patient Have a Medical Advance Directive? No No Yes No No No No  Type of Advance Directive   [place]      Does patient want to make changes to medical advance directive?  No - Patient declined No - Patient declined      Copy of [place] in Chart?   No - copy requested      Would patient like information on creating a medical advance directive? No - Patient declined No - Patient declined  No - Patient declined No - Patient declined No - Patient declined Yes (MAU/Ambulatory/Procedural Areas - Information given)    Current Medications (verified) Outpatient Encounter Medications as of 12/20/2022  Medication Sig   Accu-Chek Softclix Lancets lancets 1 each by Other route daily at 12 noon. Use as instructed   amiodarone (PACERONE) 200 MG tablet TAKE 1/2 TABLET BY MOUTH DAILY   apixaban (ELIQUIS) 5 MG TABS tablet Take 1 tablet (5 mg total) by mouth 2 (two) times daily.   atorvastatin (LIPITOR) 40 MG tablet Take 1 tablet (40 mg total) by mouth at bedtime.   cephALEXin (KEFLEX) 500 MG capsule Take 1 capsule (500 mg total) by mouth 2 (two) times daily for 7 days.   ENTRESTO 24-26 MG TAKE 1 TABLET BY MOUTH TWICE A DAY   fenofibrate 160 MG tablet TAKE 1 TABLET BY MOUTH EVERY DAY   glucose blood (ACCU-CHEK GUIDE) test strip 1 each by Other route daily. Use as instructed   hydroxyurea (HYDREA) 500 MG capsule Take 1 capsule (500 mg total) by mouth 2 (two) times daily.   ketoconazole (NIZORAL) 2 % cream Apply 1  application topically 2 (two) times daily.   levothyroxine (SYNTHROID) 75 MCG tablet Take 1 tablet (75 mcg total) by mouth daily before breakfast.   metFORMIN (GLUCOPHAGE) 500 MG tablet Take 1 tablet (500 mg total) by mouth 2 (two) times daily with a meal.   metoprolol succinate (TOPROL-XL) 100 MG 24 hr tablet TAKE 1 TABLET BY MOUTH TWICE DAILY WITH A MEAL   Multiple Vitamins-Minerals (MULTIVITAMIN WOMEN 50+) TABS Take by mouth daily.   naproxen sodium (ALEVE) 220 MG tablet Take 220 mg by mouth as needed.   omeprazole (PRILOSEC) 20 MG capsule Take 1 capsule (20 mg total) by mouth daily.   No facility-administered encounter medications on file as of 12/20/2022.    Allergies (verified) Codeine and Morphine   History: Past Medical History:  Diagnosis Date   (HFpEF) heart failure with preserved ejection fraction ([place])    a. EF previously 30-35% in 02/2021 in the setting of afib w/RVR --> normalized by repeat imaging in 05/2021.   Anxiety    Atrial flutter ([place])    a. s/p DCCV in 02/2021   Atrial septal defect    Surgical repair in 1994   Cardiomyopathy [facility])    Chest pain  Normal coronary angiography in 1994 and 2011   Dizziness    Endometrial cancer, grade I ([place]) 10/09/2019   Essential thrombocythemia ([place]) 06/06/2016   GERD (gastroesophageal reflux disease)    History of kidney stones    Hyperlipidemia    Hypertension    LBBB (left bundle branch block) 2011   Mitral regurgitation    Palpitations    Persistent atrial fibrillation ([name])    PONV (postoperative nausea and vomiting)    Syncope    Tricuspid regurgitation    Type 2 diabetes mellitus with other specified complication ([place]) 123456   Ventricular tachycardia ([place])    Exercise induced   Past Surgical History:  Procedure Laterality Date   APPENDECTOMY     ASD [facility]   BREAST EXCISIONAL BIOPSY     Right and left   BREAST SURGERY     right and left breast-benign   CARDIAC  CATHETERIZATION  12/24/2009   [name]    CARDIOVERSION N/A 03/04/2021   Procedure: CARDIOVERSION;  Surgeon: [name], MD;  Location: [facility];  Service: Endoscopy;  Laterality: N/A;   CATARACT EXTRACTION W/PHACO Left 03/11/2013   Procedure: CATARACT EXTRACTION PHACO AND INTRAOCULAR LENS PLACEMENT (IOC);  Surgeon: [name], MD;  Location: [facility];  Service: Ophthalmology;  Laterality: Left;  CDE:  12.20   HYSTEROSCOPY WITH D & C N/A 11/11/2019   Procedure: DILATATION AND CURETTAGE /HYSTEROSCOPY;  Surgeon: [name], MD;  Location: [facility];  Service: Gynecology;  Laterality: N/A;   POLYPECTOMY N/A 11/11/2019   Procedure: POLYPECTOMY(ENDOMETRIAL POLYP);  Surgeon: [name], MD;  Location: [facility];  Service: Gynecology;  Laterality: N/A;   ROBOTIC ASSISTED LAPAROSCOPIC HYSTERECTOMY AND SALPINGECTOMY Bilateral 12/09/2019   Procedure: XI ROBOTIC ASSISTED LAPAROSCOPIC HYSTERECTOMY BILATERAL SALPINGOOOPHORECTOMY;  Surgeon: [name], MD;  Location: [facility];  Service: Gynecology;  Laterality: Bilateral;   SENTINEL NODE BIOPSY N/A 12/09/2019   Procedure: SENTINEL LYMPH  NODE BIOPSY;  Surgeon: [name], MD;  Location: [facility];  Service: Gynecology;  Laterality: N/A;   TEE WITHOUT CARDIOVERSION N/A 03/04/2021   Procedure: TRANSESOPHAGEAL ECHOCARDIOGRAM (TEE);  Surgeon: [name], MD;  Location: [facility];  Service: Endoscopy;  Laterality: N/A;   TONSILLECTOMY     TUBAL LIGATION     Bilateral   Family History  Problem Relation Age of Onset   Cancer Mother        female   Heart failure Father    Stroke Sister    Cancer Sister        breast   Coronary artery disease Neg Hx    Social History   Socioeconomic History   Marital status: Widowed    Spouse name: widowed   Number of children: 3   Years of education: Not on file   Highest education level: Not on file  Occupational History   Occupation: [employer]  Tobacco Use   Smoking status: Never   Smokeless  tobacco: Never  Vaping Use   Vaping Use: Never used  Substance and Sexual Activity   Alcohol use: No   Drug use: No   Sexual activity: Not Currently    Birth control/protection: Post-menopausal, Surgical    Comment: hyst  Other Topics Concern   Not on file  Social History Narrative   Married and resides in [place]. 3 children. 3 grandchildren.   Sedentary lifestyle   Social Determinants of Health   Financial Resource Strain: Low Risk  (  03/15/2022)   Overall Financial Resource Strain (CARDIA)    Difficulty of Paying Living Expenses: Not very hard  Food Insecurity: No Food Insecurity (03/15/2022)   Hunger Vital Sign    Worried About Running Out of Food in the Last Year: Never true    Ran Out of Food in the Last Year: Never true  Transportation Needs: No Transportation Needs (03/15/2022)   PRAPARE - Hydrologist (Medical): No    Lack of Transportation (Non-Medical): No  Physical Activity: Insufficiently Active (03/15/2022)   Exercise Vital Sign    Days of Exercise per Week: 1 day    Minutes of Exercise per Session: 100 min  Stress: No Stress Concern Present (03/15/2022)   [place]    Feeling of Stress : Not at all  Social Connections: Moderately Isolated (03/15/2022)   Social Connection and Isolation Panel [NHANES]    Frequency of Communication with Friends and Family: More than three times a week    Frequency of Social Gatherings with Friends and Family: Once a week    Attends Religious Services: More than 4 times per year    Active Member of [employer] or Organizations: No    Attends Archivist Meetings: Never    Marital Status: Widowed    Tobacco Counseling Counseling given: Not Answered   Clinical Intake:                 Diabetic?Yes   Nutrition Risk Assessment:  Has the patient had any N/V/D within the last 2 months?  {YES/NO:21197} Does the patient have  any non-healing wounds?  {YES/NO:21197} Has the patient had any unintentional weight loss or weight gain?  {YES/NO:21197}  Diabetes:  Is the patient diabetic?  {YES/NO:21197} If diabetic, was a CBG obtained today?  {YES/NO:21197} Did the patient bring in their glucometer from home?  {YES/NO:21197} How often do you monitor your CBG's? ***.   Financial Strains and Diabetes Management:  Are you having any financial strains with the device, your supplies or your medication? {YES/NO:21197}.  Does the patient want to be seen by Chronic Care Management for management of their diabetes?  {YES/NO:21197} Would the patient like to be referred to a Nutritionist or for Diabetic Management?  {YES/NO:21197}  Diabetic Exams:  {Diabetic Eye Exam:2101801} Diabetic Foot Exam: Completed 10/12/22          Activities of Daily Living     No data to display          Patient Care Team: Dettinger, Fransisca Kaufmann, MD as PCP - General (Family Medicine) Harl Bowie, Alphonse Guild, MD as PCP - Cardiology (Cardiology) Felicie Morn, MD (Inactive) (General Surgery) Gala Romney Cristopher Estimable, MD as Consulting Physician (Gastroenterology) Lavera Guise, [facility] as Pharmacist (Family Medicine)  Indicate any recent Medical Services you may have received from other than [facility] providers in the past year (date may be approximate).     Assessment:   This is a routine wellness examination for Heather Barber.  Hearing/Vision screen No results found.  Dietary issues and exercise activities discussed:     Goals Addressed   None    Depression Screen    12/12/2022    3:47 PM 11/01/2022    2:17 PM 10/12/2022    3:48 PM 09/29/2022   10:47 AM 07/12/2022    3:18 PM 07/06/2022    4:02 PM 03/15/2022    1:35 PM  PHQ 2/9 Scores  PHQ - 2 Score 0 0  0 0 0 0 0  PHQ- 9 Score 0 0 0 0 0 0 0    Fall Risk    12/12/2022    3:47 PM 11/01/2022    2:16 PM 10/12/2022    3:48 PM 09/29/2022   10:47 AM 09/14/2022    4:07 PM  [place] in the  past year? 0 0 0 0 0  Number falls in past yr: 0 0  0 0  Injury with Fall? 0 0  0 0  Risk for fall due to : No Fall Risks No Fall Risks  No Fall Risks   Follow up Falls evaluation completed Education provided  Falls evaluation completed     [place]:  Any stairs in or around the home? {YES/NO:21197} If so, are there any without handrails? {YES/NO:21197} Home free of loose throw rugs in walkways, pet beds, electrical cords, etc? {YES/NO:21197} Adequate lighting in your home to reduce risk of falls? {YES/NO:21197}  ASSISTIVE DEVICES UTILIZED TO PREVENT FALLS:  Life alert? {YES/NO:21197} Use of a cane, walker or w/c? {YES/NO:21197} Grab bars in the bathroom? {YES/NO:21197} Shower chair or bench in shower? {YES/NO:21197} Elevated toilet seat or a handicapped toilet? {YES/NO:21197}  TIMED UP AND GO:  Was the test performed? No . Telephonic visit  Length of time to ambulate 10 feet: *** sec.   {Appearance of YN:[id number]  Cognitive Function:        11/14/2021    1:40 PM 11/14/2021    1:31 PM  6CIT Screen  What Year? 0 points 0 points  What month? 0 points 0 points  What time? 0 points 0 points  Count back from 20 0 points 0 points  Months in reverse 0 points 2 points  Repeat phrase 0 points 0 points  Total Score 0 points 2 points    Immunizations Immunization History  Administered Date(s) Administered   Fluad Quad(high Dose 65+) 08/04/2016, 09/10/2017, 08/14/2018, 07/18/2019, 09/16/2020, 09/21/2021   Influenza, High Dose Seasonal PF 08/04/2016, 09/10/2017, 08/14/2018   Influenza,trivalent, recombinat, inj, PF 08/13/2014, 08/02/2015   Influenza-Unspecified 08/13/2014, 08/02/2015   Moderna Sars-Covid-2 Vaccination 12/25/2019, 01/23/2020, 09/11/2020   Pneumococcal Conjugate-13 11/25/2015   Pneumococcal Polysaccharide-23 11/30/2016   Tdap 10/12/2022   Zoster Recombinat (Shingrix) 03/30/2022, 07/06/2022    TDAP status: Up to  date  {Flu Vaccine status:2101806}  Pneumococcal vaccine status: Up to date  Covid-19 vaccine status: Information provided on how to obtain vaccines.   Qualifies for Shingles Vaccine? Yes   Zostavax completed No   Shingrix Completed?: Yes  Screening Tests Health Maintenance  Topic Date Due   OPHTHALMOLOGY EXAM  Never done   COVID-19 Vaccine (4 - 2023-24 season) 06/30/2022   Medicare Annual Wellness (AWV)  11/14/2022   INFLUENZA VACCINE  01/28/2023 (Originally 05/30/2022)   HEMOGLOBIN A1C  04/13/2023   Diabetic kidney evaluation - eGFR measurement  10/13/2023   Diabetic kidney evaluation - Urine ACR  10/13/2023   FOOT EXAM  10/13/2023   DTaP/Tdap/Td (2 - Td or Tdap) 10/12/2032   Pneumonia Vaccine 49+ Years old  Completed   DEXA SCAN  Completed   Hepatitis C Screening  Completed   Zoster Vaccines- Shingrix  Completed   HPV VACCINES  Aged Out    Health Maintenance  Health Maintenance Due  Topic Date Due   OPHTHALMOLOGY EXAM  Never done   COVID-19 Vaccine (4 - 2023-24 season) 06/30/2022   Medicare Annual Wellness (AWV)  11/14/2022    Colorectal cancer  screening: No longer required.   Mammogram status: No longer required due to age.  Bone Density status: Completed 09/17/20. Results reflect: Bone density results: OSTEOPENIA. Repeat every 2 years.  Lung Cancer Screening: (Low Dose CT Chest recommended if Age 68-80 years, 30 pack-year currently smoking OR have quit w/in 15years.) does not qualify.   Lung Cancer Screening Referral: n/a  Additional Screening:  Hepatitis C Screening: does qualify; Completed 07/18/19  Vision Screening: Recommended annual ophthalmology exams for early detection of glaucoma and other disorders of the eye. Is the patient up to date with their annual eye exam?  {YES/NO:21197} Who is the provider or what is the name of the office in which the patient attends annual eye exams? *** If pt is not established with a provider, would they like to be  referred to a provider to establish care? {YES/NO:21197}.   Dental Screening: Recommended annual dental exams for proper oral hygiene  Community Resource Referral / Chronic Care Management: CRR required this visit?  {YES/NO:21197}  CCM required this visit?  {YES/NO:21197}     Plan:     I have personally reviewed and noted the following in the patient's chart:   Medical and social history Use of alcohol, tobacco or illicit drugs  Current medications and supplements including opioid prescriptions. {Opioid Prescriptions:[phone]} Functional ability and status Nutritional status Physical activity Advanced directives List of other physicians Hospitalizations, surgeries, and ER visits in previous 12 months Vitals Screenings to include cognitive, depression, and falls Referrals and appointments  In addition, I have reviewed and discussed with patient certain preventive protocols, quality metrics, and best practice recommendations. A written personalized care plan for preventive services as well as general preventive health recommendations were provided to patient.     Denman George [place], [place]   [id number]   Due to this being a virtual visit, the after visit summary with patients personalized plan was offered to patient via mail or my-chart. ***Patient declined at this time./ Patient would like to access on my-chart/ per request, patient was mailed a copy of AVS./ Patient preferred to pick up at office at next visit   Nurse Notes: ***

## 2022-12-19 NOTE — Telephone Encounter (Signed)
Contacted Francene Boyers Chavis to schedule their annual wellness visit. Appointment made for 12/20/2022.  Thank you,  Colletta [place],  [place] Program Direct Dial ??[phone]

## 2022-12-19 NOTE — Telephone Encounter (Signed)
Called patient to schedule Medicare Annual Wellness Visit (AWV). Left message for patient to call back and schedule Medicare Annual Wellness Visit (AWV).  Last date of AWV: 11/14/2021   Please schedule an appointment at any time with either Mickel Baas or [place], [facility]'s. .  If any questions, please contact me at [phone].  Thank you,  [place] ??[phone]

## 2022-12-20 ENCOUNTER — Ambulatory Visit (HOSPITAL_COMMUNITY)
Admission: RE | Admit: 2022-12-20 | Discharge: 2022-12-20 | Disposition: A | Discharge status: Home / Self Care | Payer: Medicare Other | Source: Ambulatory Visit | Attending: Family Medicine | Admitting: Family Medicine

## 2022-12-20 ENCOUNTER — Ambulatory Visit (INDEPENDENT_AMBULATORY_CARE_PROVIDER_SITE_OTHER): Payer: Medicare Other

## 2022-12-20 VITALS — Ht 64.0 in | Wt 169.0 lb

## 2022-12-20 DIAGNOSIS — Z0000 Encounter for general adult medical examination without abnormal findings: Secondary | ICD-10-CM | POA: Diagnosis not present

## 2022-12-20 DIAGNOSIS — Z1231 Encounter for screening mammogram for malignant neoplasm of breast: Secondary | ICD-10-CM | POA: Diagnosis not present

## 2022-12-28 ENCOUNTER — Ambulatory Visit (INDEPENDENT_AMBULATORY_CARE_PROVIDER_SITE_OTHER): Payer: Medicare Other | Admitting: Pharmacist

## 2022-12-28 DIAGNOSIS — I4891 Unspecified atrial fibrillation: Secondary | ICD-10-CM

## 2023-01-03 ENCOUNTER — Inpatient Hospital Stay: Payer: Medicare Other | Attending: Hematology

## 2023-01-03 ENCOUNTER — Telehealth: Payer: Self-pay | Admitting: Cardiology

## 2023-01-03 DIAGNOSIS — D7589 Other specified diseases of blood and blood-forming organs: Secondary | ICD-10-CM

## 2023-01-03 DIAGNOSIS — D473 Essential (hemorrhagic) thrombocythemia: Secondary | ICD-10-CM | POA: Diagnosis present

## 2023-01-03 DIAGNOSIS — D75839 Thrombocytosis, unspecified: Secondary | ICD-10-CM

## 2023-01-03 DIAGNOSIS — D539 Nutritional anemia, unspecified: Secondary | ICD-10-CM

## 2023-01-03 DIAGNOSIS — E538 Deficiency of other specified B group vitamins: Secondary | ICD-10-CM

## 2023-01-03 DIAGNOSIS — D649 Anemia, unspecified: Secondary | ICD-10-CM

## 2023-01-03 DIAGNOSIS — E611 Iron deficiency: Secondary | ICD-10-CM

## 2023-01-03 LAB — COMPREHENSIVE METABOLIC PANEL
ALT: 14 U/L (ref 0–44)
AST: 18 U/L (ref 15–41)
Albumin: 4.1 g/dL (ref 3.5–5.0)
Alkaline Phosphatase: 48 U/L (ref 38–126)
Anion gap: 10 (ref 5–15)
BUN: 23 mg/dL (ref 8–23)
CO2: 24 mmol/L (ref 22–32)
Calcium: 9.2 mg/dL (ref 8.9–10.3)
Chloride: 103 mmol/L (ref 98–111)
Creatinine, Ser: 1.09 mg/dL — ABNORMAL HIGH (ref 0.44–1.00)
GFR, Estimated: 53 mL/min — ABNORMAL LOW (ref >60)
Glucose, Bld: 113 mg/dL — ABNORMAL HIGH (ref 70–99)
Potassium: 4.5 mmol/L (ref 3.5–5.1)
Sodium: 137 mmol/L (ref 135–145)
Total Bilirubin: 0.6 mg/dL (ref 0.3–1.2)
Total Protein: 7.1 g/dL (ref 6.5–8.1)

## 2023-01-03 LAB — CBC WITH DIFFERENTIAL/PLATELET
Abs Immature Granulocytes: 0.02 10*3/uL (ref 0.00–0.07)
Basophils Absolute: 0 10*3/uL (ref 0.0–0.1)
Basophils Relative: 1 %
Eosinophils Absolute: 0.1 10*3/uL (ref 0.0–0.5)
Eosinophils Relative: 2 %
HCT: 35.8 % — ABNORMAL LOW (ref 36.0–46.0)
Hemoglobin: 11.9 g/dL — ABNORMAL LOW (ref 12.0–15.0)
Immature Granulocytes: 0 %
Lymphocytes Relative: 29 %
Lymphs Abs: 1.7 10*3/uL (ref 0.7–4.0)
MCH: 35.1 pg — ABNORMAL HIGH (ref 26.0–34.0)
MCHC: 33.2 g/dL (ref 30.0–36.0)
MCV: 105.6 fL — ABNORMAL HIGH (ref 80.0–100.0)
Monocytes Absolute: 0.5 10*3/uL (ref 0.1–1.0)
Monocytes Relative: 9 %
Neutro Abs: 3.4 10*3/uL (ref 1.7–7.7)
Neutrophils Relative %: 59 %
Platelets: 356 10*3/uL (ref 150–400)
RBC: 3.39 MIL/uL — ABNORMAL LOW (ref 3.87–5.11)
RDW: 14.2 % (ref 11.5–15.5)
WBC: 5.8 10*3/uL (ref 4.0–10.5)
nRBC: 0 % (ref 0.0–0.2)

## 2023-01-03 LAB — LACTATE DEHYDROGENASE: LDH: 138 U/L (ref 98–192)

## 2023-01-03 MED ORDER — APIXABAN 5 MG PO TABS: 5.0000 mg | ORAL_TABLET | Freq: Two times a day (BID) | ORAL | 0 refills | Status: DC

## 2023-01-03 NOTE — Addendum Note (Signed)
Addended by: Berlinda Last on: 01/03/2023 02:34 PM   Modules accepted: Orders

## 2023-01-03 NOTE — Telephone Encounter (Signed)
Sample request for Eliquis received. Indication: AF Last office visit: 09/19/22  Zandra Abts MD Scr:  1.09 on 01/03/23 Age: 77 Weight: 79.4KG  Based on above findings Eliquis '5mg'$  twice daily is the appropriate dose.  OK to provide samples if available.

## 2023-01-03 NOTE — Telephone Encounter (Signed)
Patient came into office to drop off patient assistant forms,and is requesting samples of Eliquis. Pt informed message to be sent to clinical and someone would contact pt regarding samples.

## 2023-01-03 NOTE — Telephone Encounter (Signed)
Eliquis Sample #28: Lot #[id number] Exp: 06/25 Sig: Take 1 tablet by mouth twice daily  Pt dropped off patient assistance paperwork for Eliquis

## 2023-01-05 ENCOUNTER — Telehealth: Payer: Self-pay | Admitting: *Deleted

## 2023-01-05 NOTE — Progress Notes (Signed)
   Pharmacy Note   12/28/2022 Name:  Heather Barber           MRN:  [id number]      DOB:  [birth year]   Summary: PMH per cards notes: ASD (s/p repair in 1994), thrombocytosis, HTN, HLD, valvular heart disease and recently diagnosed atrial fibrillation with associated cardiomyopathy (diagnosed in 02/2021 with echo showing EF of 30-35%)   Atrial Fibrillation/ CHF LVEF EF30-35%: Controlled; current rate/rhythm control: amiodarone, metoprolol; anticoagulant treatment: ELIQUIS CHADS2VASc score: at least 4 Home blood pressure, heart rate readings: controlled Educated on taking BP at home, aware of symptoms; denies signs & symptoms of bleeding Assessed patient finances.  Patient is not eligible for Eliquis BMS patient assistance until she meets 3% out of pocket Patient not eligible for re-enrollment until 02/2023--grant still active Patient takes card to pharmacy and pick up entresto    Follow up scheduled with PharmD for spring 2024  Regina Eck, PharmD, [place] Clinical Pharmacist, [place]

## 2023-01-05 NOTE — Telephone Encounter (Signed)
Called pt to notify that she has been denied for the BMS patient assistance. Pt's household income is over the current eligibility criteria and she has not shown documentation of 3% out of pocket prescription expenses for this year. No answer and no voice mail.

## 2023-01-08 ENCOUNTER — Inpatient Hospital Stay: Payer: Medicare Other

## 2023-01-11 ENCOUNTER — Encounter: Payer: Self-pay | Admitting: Family Medicine

## 2023-01-11 ENCOUNTER — Ambulatory Visit (INDEPENDENT_AMBULATORY_CARE_PROVIDER_SITE_OTHER): Payer: Medicare Other | Admitting: Family Medicine

## 2023-01-11 VITALS — BP 122/68 | HR 78 | Ht 64.0 in | Wt 168.0 lb

## 2023-01-11 DIAGNOSIS — E782 Mixed hyperlipidemia: Secondary | ICD-10-CM | POA: Diagnosis not present

## 2023-01-11 DIAGNOSIS — E781 Pure hyperglyceridemia: Secondary | ICD-10-CM

## 2023-01-11 DIAGNOSIS — E039 Hypothyroidism, unspecified: Secondary | ICD-10-CM

## 2023-01-11 DIAGNOSIS — R399 Unspecified symptoms and signs involving the genitourinary system: Secondary | ICD-10-CM | POA: Diagnosis not present

## 2023-01-11 DIAGNOSIS — E1169 Type 2 diabetes mellitus with other specified complication: Secondary | ICD-10-CM | POA: Diagnosis not present

## 2023-01-11 DIAGNOSIS — K219 Gastro-esophageal reflux disease without esophagitis: Secondary | ICD-10-CM

## 2023-01-11 DIAGNOSIS — I10 Essential (primary) hypertension: Secondary | ICD-10-CM

## 2023-01-11 DIAGNOSIS — I152 Hypertension secondary to endocrine disorders: Secondary | ICD-10-CM

## 2023-01-11 DIAGNOSIS — E1159 Type 2 diabetes mellitus with other circulatory complications: Secondary | ICD-10-CM

## 2023-01-11 LAB — URINALYSIS
Bilirubin, UA: NEGATIVE
Glucose, UA: NEGATIVE
Nitrite, UA: NEGATIVE
Specific Gravity, UA: >1.03 — ABNORMAL HIGH (ref 1.005–1.030)
Urobilinogen, Ur: 0.2 mg/dL (ref 0.2–1.0)
pH, UA: 5.5 (ref 5.0–7.5)

## 2023-01-11 LAB — BAYER DCA HB A1C WAIVED: HB A1C (BAYER DCA - WAIVED): 6.8 % — ABNORMAL HIGH (ref 4.8–5.6)

## 2023-01-11 MED ORDER — CEPHALEXIN 500 MG PO CAPS: 500.0000 mg | ORAL_CAPSULE | Freq: Four times a day (QID) | ORAL | 0 refills | Status: DC

## 2023-01-11 MED ORDER — OMEPRAZOLE 20 MG PO CPDR: 20.0000 mg | DELAYED_RELEASE_CAPSULE | Freq: Every day | ORAL | 3 refills | Status: DC

## 2023-01-11 NOTE — Addendum Note (Signed)
Addended by: Caryl Pina on: 01/11/2023 04:26 PM   Modules accepted: Orders

## 2023-01-11 NOTE — Progress Notes (Signed)
BP 122/68   Pulse 78   Ht '5\' 4"'$  (1.626 m)   Wt 168 lb (76.2 kg)   SpO2 96%   BMI 28.84 kg/m    Subjective:   [name], female    DOB: 05-18-1946, 77 y.o.   MRN: [id number]  HPI: Heather Barber is a 77 y.o. female presenting on 01/11/2023 for Medical Management of Chronic Issues and Diabetes   HPI Type 2 diabetes mellitus Patient comes in today for recheck of his diabetes. Patient has been currently taking metformin. Patient is currently on an ACE inhibitor/ARB. Patient has not seen an ophthalmologist this year. Patient denies any issues with their feet. The symptom started onset as an adult hypertension and hyperlipidemia and hypothyroidism ARE RELATED TO DM   Hypertension Patient is currently on amiodarone and metoprolol, and their blood pressure today is 122/68. Patient denies any lightheadedness or dizziness. Patient denies headaches, blurred vision, chest pains, shortness of breath, or weakness. Denies any side effects from medication and is content with current medication.   Hyperlipidemia Patient is coming in for recheck of his hyperlipidemia. The patient is currently taking atorvastatin and fenofibrate. They deny any issues with myalgias or history of liver damage from it. They deny any focal numbness or weakness or chest pain.   Hypothyroidism recheck Patient is coming in for thyroid recheck today as well. They deny any issues with hair changes or heat or cold problems or diarrhea or constipation. They deny any chest pain or palpitations. They are currently on levothyroxine 75 micrograms   Dysuria Patient is coming in for recurrent dysuria that she gets off-and-on.  This time it started a couple days ago but she has been getting this frequently and she does have a urology appointment later this month.  Relevant past medical, surgical, family and social history reviewed and updated as indicated. Interim medical history since our last visit  reviewed. Allergies and medications reviewed and updated.  Review of Systems  Constitutional:  Negative for chills and fever.  Eyes:  Negative for visual disturbance.  Respiratory:  Negative for chest tightness and shortness of breath.   Cardiovascular:  Negative for chest pain and leg swelling.  Musculoskeletal:  Negative for back pain and gait problem.  Skin:  Negative for rash.  Neurological:  Negative for dizziness, light-headedness and headaches.  Psychiatric/Behavioral:  Negative for agitation and behavioral problems.   All other systems reviewed and are negative.   Per HPI unless specifically indicated above   Allergies as of 01/11/2023       Reactions   Codeine Nausea And Vomiting   Morphine Other (See Comments)   GI symptoms        Medication List        Accurate as of January 11, 2023  4:21 PM. If you have any questions, ask your nurse or doctor.          Accu-Chek Guide test strip Generic drug: glucose blood 1 each by Other route daily. Use as instructed   Accu-Chek Softclix Lancets lancets 1 each by Other route daily at 12 noon. Use as instructed   amiodarone 200 MG tablet Commonly known as: PACERONE TAKE 1/2 TABLET BY MOUTH DAILY   apixaban 5 MG Tabs tablet Commonly known as: ELIQUIS Take 1 tablet (5 mg total) by mouth 2 (two) times daily.   atorvastatin 40 MG tablet Commonly known as: LIPITOR Take 1 tablet (40 mg total) by mouth at bedtime.   Entresto 24-26  MG Generic drug: sacubitril-valsartan TAKE 1 TABLET BY MOUTH TWICE A DAY   fenofibrate 160 MG tablet TAKE 1 TABLET BY MOUTH EVERY DAY   hydroxyurea 500 MG capsule Commonly known as: HYDREA Take 1 capsule (500 mg total) by mouth 2 (two) times daily.   ketoconazole 2 % cream Commonly known as: NIZORAL Apply 1 application topically 2 (two) times daily.   levothyroxine 75 MCG tablet Commonly known as: SYNTHROID Take 1 tablet (75 mcg total) by mouth daily before breakfast.    metFORMIN 500 MG tablet Commonly known as: GLUCOPHAGE Take 1 tablet (500 mg total) by mouth 2 (two) times daily with a meal.   metoprolol succinate 100 MG 24 hr tablet Commonly known as: TOPROL-XL TAKE 1 TABLET BY MOUTH TWICE DAILY WITH A MEAL   Multivitamin Women 50+ Tabs Take by mouth daily.   naproxen sodium 220 MG tablet Commonly known as: ALEVE Take 220 mg by mouth as needed.   omeprazole 20 MG capsule Commonly known as: PRILOSEC Take 1 capsule (20 mg total) by mouth daily.         Objective:   BP 122/68   Pulse 78   Ht '5\' 4"'$  (1.626 m)   Wt 168 lb (76.2 kg)   SpO2 96%   BMI 28.84 kg/m   Wt Readings from Last 3 Encounters:  01/11/23 168 lb (76.2 kg)  12/20/22 169 lb (76.7 kg)  12/12/22 169 lb (76.7 kg)    Physical Exam Vitals and nursing note reviewed.  Constitutional:      General: She is not in acute distress.    Appearance: She is well-developed. She is not diaphoretic.  Eyes:     Conjunctiva/sclera: Conjunctivae normal.  Cardiovascular:     Rate and Rhythm: Normal rate and regular rhythm.     Heart sounds: Normal heart sounds. No murmur heard. Pulmonary:     Effort: Pulmonary effort is normal. No respiratory distress.     Breath sounds: Normal breath sounds. No wheezing.  Musculoskeletal:        General: No swelling or tenderness. Normal range of motion.  Skin:    General: Skin is warm and dry.     Findings: No rash.  Neurological:     Mental Status: She is alert and oriented to person, place, and time.     Coordination: Coordination normal.  Psychiatric:        Behavior: Behavior normal.       Assessment & Plan:   Problem List Items Addressed This Visit       Cardiovascular and Mediastinum   Hypertension     Digestive   Gastroesophageal reflux disease   Relevant Medications   omeprazole (PRILOSEC) 20 MG capsule     Endocrine   Type 2 diabetes mellitus with other specified complication ([place]) - Primary   Relevant Orders    Bayer DCA Hb A1c Waived   Hypothyroidism     Other   Hyperlipidemia   Hypertriglyceridemia   Other Visit Diagnoses     UTI symptoms       Relevant Orders   Urine Culture   Urinalysis       Urinalysis shows 3+ blood and 3+ protein and 2+ leukocytes and trace ketones and 1+ bilirubin.  A1c shows 6.8 which looks decent.  Will give antibiotics to treat like possible UTI.  But do heavily recommend that she does need to see urology. Follow up plan: Return in about 3 months (around 04/13/2023), or if symptoms worsen or  fail to improve, for Diabetes and hypertension and hypothyroidism recheck.  Counseling provided for all of the vaccine components Orders Placed This Encounter  Procedures   Urine Culture   Bayer [facility] Hb A1c Waived   Urinalysis    Caryl Pina, MD [facility] 01/11/2023, 4:21 PM

## 2023-01-15 LAB — URINE CULTURE

## 2023-01-16 ENCOUNTER — Ambulatory Visit: Payer: Medicare Other | Admitting: Physician Assistant

## 2023-01-17 ENCOUNTER — Other Ambulatory Visit: Payer: Self-pay | Admitting: Physician Assistant

## 2023-01-17 ENCOUNTER — Other Ambulatory Visit: Payer: Self-pay | Admitting: Cardiology

## 2023-01-17 ENCOUNTER — Other Ambulatory Visit: Payer: Self-pay | Admitting: Family Medicine

## 2023-01-23 ENCOUNTER — Encounter: Payer: Self-pay | Admitting: Urology

## 2023-01-23 ENCOUNTER — Ambulatory Visit (INDEPENDENT_AMBULATORY_CARE_PROVIDER_SITE_OTHER): Payer: Medicare Other | Admitting: Urology

## 2023-01-23 VITALS — BP 168/82 | HR 75 | Ht 64.0 in | Wt 168.0 lb

## 2023-01-23 DIAGNOSIS — N3000 Acute cystitis without hematuria: Secondary | ICD-10-CM

## 2023-01-23 DIAGNOSIS — R300 Dysuria: Secondary | ICD-10-CM | POA: Diagnosis not present

## 2023-01-23 DIAGNOSIS — Z87440 Personal history of urinary (tract) infections: Secondary | ICD-10-CM

## 2023-01-23 DIAGNOSIS — N952 Postmenopausal atrophic vaginitis: Secondary | ICD-10-CM

## 2023-01-23 LAB — BLADDER SCAN AMB NON-IMAGING: Scan Result: 0

## 2023-01-23 MED ORDER — METHENAMINE HIPPURATE 1 G PO TABS: 1.0000 g | ORAL_TABLET | Freq: Two times a day (BID) | ORAL | 11 refills | Status: DC

## 2023-01-23 MED ORDER — ESTRADIOL 0.1 MG/GM VA CREA: TOPICAL_CREAM | VAGINAL | 3 refills | Status: DC

## 2023-01-23 NOTE — Progress Notes (Signed)
H&P  Chief Complaint: Dysuria  History of Present Illness: Heather Barber is a 77 y.o. year old female referred by [facility] for E/M of dysuria as well as positive urine cultures (pansensitive E coli/klebsiella)  Symptoms have been recurrent for about 3 months.  She has taken 1 course of Cipro and 2 of cephalexin.  Currently asymptomatic.  She does have occasional blood when she does have these symptomatic episodes.  No associated abdominal or flank pain.  Has a good stream, usually feels like she empties well.    Past Medical History:  Diagnosis Date   (HFpEF) heart failure with preserved ejection fraction ([place])    a. EF previously 30-35% in 02/2021 in the setting of afib w/RVR --> normalized by repeat imaging in 05/2021.   Anxiety    Atrial flutter ([place])    a. s/p DCCV in 02/2021   Atrial septal defect    Surgical repair in 1994   Cardiomyopathy [facility])    Chest pain    Normal coronary angiography in 1994 and 2011   Dizziness    Endometrial cancer, grade I ([place]) 10/09/2019   Essential thrombocythemia ([place]) 06/06/2016   GERD (gastroesophageal reflux disease)    History of kidney stones    Hyperlipidemia    Hypertension    LBBB (left bundle branch block) 2011   Mitral regurgitation    Palpitations    Persistent atrial fibrillation ([name])    PONV (postoperative nausea and vomiting)    Syncope    Tricuspid regurgitation    Type 2 diabetes mellitus with other specified complication ([place]) 123456   Ventricular tachycardia ([place])    Exercise induced    Past Surgical History:  Procedure Laterality Date   APPENDECTOMY     ASD [facility]   BREAST EXCISIONAL BIOPSY     Right and left   BREAST SURGERY     right and left breast-benign   CARDIAC CATHETERIZATION  12/24/2009   [name]    CARDIOVERSION N/A 03/04/2021   Procedure: CARDIOVERSION;  Surgeon: [name], MD;  Location: [facility];  Service: Endoscopy;  Laterality: N/A;   CATARACT EXTRACTION  W/PHACO Left 03/11/2013   Procedure: CATARACT EXTRACTION PHACO AND INTRAOCULAR LENS PLACEMENT (IOC);  Surgeon: [name], MD;  Location: [facility];  Service: Ophthalmology;  Laterality: Left;  CDE:  12.20   HYSTEROSCOPY WITH D & C N/A 11/11/2019   Procedure: DILATATION AND CURETTAGE /HYSTEROSCOPY;  Surgeon: [name], MD;  Location: [facility];  Service: Gynecology;  Laterality: N/A;   POLYPECTOMY N/A 11/11/2019   Procedure: POLYPECTOMY(ENDOMETRIAL POLYP);  Surgeon: [name], MD;  Location: [facility];  Service: Gynecology;  Laterality: N/A;   ROBOTIC ASSISTED LAPAROSCOPIC HYSTERECTOMY AND SALPINGECTOMY Bilateral 12/09/2019   Procedure: XI ROBOTIC ASSISTED LAPAROSCOPIC HYSTERECTOMY BILATERAL SALPINGOOOPHORECTOMY;  Surgeon: [name], MD;  Location: [facility];  Service: Gynecology;  Laterality: Bilateral;   SENTINEL NODE BIOPSY N/A 12/09/2019   Procedure: SENTINEL LYMPH  NODE BIOPSY;  Surgeon: [name], MD;  Location: [facility];  Service: Gynecology;  Laterality: N/A;   TEE WITHOUT CARDIOVERSION N/A 03/04/2021   Procedure: TRANSESOPHAGEAL ECHOCARDIOGRAM (TEE);  Surgeon: [name], MD;  Location: [facility];  Service: Endoscopy;  Laterality: N/A;   TONSILLECTOMY     TUBAL LIGATION     Bilateral    Home Medications:  (Not in a hospital admission)   Allergies:  Allergies  Allergen Reactions   Codeine Nausea And Vomiting  Morphine Other (See Comments)    GI symptoms    Family History  Problem Relation Age of Onset   Cancer Mother        female   Heart failure Father    Stroke Sister    Cancer Sister        breast   Coronary artery disease Neg Hx     Social History:  reports that she has never smoked. She has never used smokeless tobacco. She reports that she does not drink alcohol and does not use drugs.  ROS: A complete review of systems was performed.  All systems are negative except for pertinent findings as noted.  Physical Exam:  Vital signs in last 24  hours: @VSRANGES @ General:  Alert and oriented, No acute distress HEENT: Normocephalic, atraumatic Neck: No JVD or lymphadenopathy Cardiovascular: Regular rate  Lungs: Normal inspiratory/expiratory excursion Abdomen: Soft, nontender, nondistended, no abdominal masses Back: No CVA tenderness Extremities: No edema Neurologic: Grossly intact  I have reviewed prior pt notes  I have reviewed notes from referring/previous physicians  I have reviewed urinalysis results  I have reviewed prior urine cultures  Bladder scan reviewed  Impression/Assessment:  1.  Recurrent cystitis in postmenopausal female  2.  Vaginal atrophic changes  Plan:  1.  Begin estrogen therapy-pea-sized bit to finger, rub in the labial area 2 nights a week  2.  Take probiotic 1 a day  3.  I will start her on methenamine hippurate 1 g twice daily for 3 months  4.  Follow-up here in 3 months for routine check  Jorja Loa 01/23/2023, 6:19 AM  Lillette Boxer. Kelijah Towry MD

## 2023-01-23 NOTE — Progress Notes (Signed)
post void residual =0mL 

## 2023-01-24 LAB — URINALYSIS, ROUTINE W REFLEX MICROSCOPIC
Bilirubin, UA: NEGATIVE
Glucose, UA: NEGATIVE
Nitrite, UA: NEGATIVE
Specific Gravity, UA: 1.025 (ref 1.005–1.030)
Urobilinogen, Ur: 1 mg/dL (ref 0.2–1.0)
pH, UA: 5 (ref 5.0–7.5)

## 2023-01-24 LAB — MICROSCOPIC EXAMINATION: RBC, Urine: >30 /hpf — AB (ref 0–2)

## 2023-01-29 ENCOUNTER — Encounter: Payer: Self-pay | Admitting: Medical

## 2023-01-29 ENCOUNTER — Ambulatory Visit: Payer: Medicare Other | Attending: Medical | Admitting: Medical

## 2023-01-29 VITALS — BP 126/64 | HR 74 | Ht 64.0 in | Wt 167.0 lb

## 2023-01-29 DIAGNOSIS — I340 Nonrheumatic mitral (valve) insufficiency: Secondary | ICD-10-CM | POA: Insufficient documentation

## 2023-01-29 DIAGNOSIS — I5032 Chronic diastolic (congestive) heart failure: Secondary | ICD-10-CM | POA: Insufficient documentation

## 2023-01-29 DIAGNOSIS — I480 Paroxysmal atrial fibrillation: Secondary | ICD-10-CM | POA: Diagnosis not present

## 2023-01-29 MED ORDER — APIXABAN 5 MG PO TABS: 5.0000 mg | ORAL_TABLET | Freq: Two times a day (BID) | ORAL | 0 refills | Status: DC

## 2023-01-29 NOTE — Patient Instructions (Signed)
Medication Instructions:  Your physician recommends that you continue on your current medications as directed. Please refer to the Current Medication list given to you today.  *If you need a refill on your cardiac medications before your next appointment, please call your pharmacy*   Lab Work: None If you have labs (blood work) drawn today and your tests are completely normal, you will receive your results only by: MyChart Message (if you have MyChart) OR A paper copy in the mail If you have any lab test that is abnormal or we need to change your treatment, we will call you to review the results.   Testing/Procedures: Your physician has requested that you have an echocardiogram. Echocardiography is a painless test that uses sound waves to create images of your heart. It provides your doctor with information about the size and shape of your heart and how well your heart's chambers and valves are working. This procedure takes approximately one hour. There are no restrictions for this procedure. Please do NOT wear cologne, perfume, aftershave, or lotions (deodorant is allowed). Please arrive 15 minutes prior to your appointment time.    Follow-Up: At [facility], you and your health needs are our priority.  As part of our continuing mission to provide you with exceptional heart care, we have created designated Provider Care Teams.  These Care Teams include your primary Cardiologist (physician) and Advanced Practice Providers (APPs -  Physician Assistants and Nurse Practitioners) who all work together to provide you with the care you need, when you need it.  We recommend signing up for the patient portal called "MyChart".  Sign up information is provided on this After Visit Summary.  MyChart is used to connect with patients for Virtual Visits (Telemedicine).  Patients are able to view lab/test results, encounter notes, upcoming appointments, etc.  Non-urgent messages can be sent to your  provider as well.   To learn more about what you can do with MyChart, go to [email or web address].    Your next appointment:   6 month(s)  Provider:   Jonathan Branch, MD    Other Instructions    

## 2023-01-29 NOTE — Progress Notes (Signed)
Cardiology Office Note:    Date:  01/29/2023   ID:  JHERI ROURKE, DOB 04-May-1946, MRN [id number]  PCP:  Dettinger, Fransisca Kaufmann, MD  [facility] Cardiologist:  Carlyle Dolly, MD  [facility] Electrophysiologist:  None   Referring MD: Dettinger, Fransisca Kaufmann, MD   Chief Complaint: 6 month follow-up  History of Present Illness:    Heather Barber is a 77 y.o. female with a hx of Afib/atypical aflutter, HFimpEF, MR, hypothyroidism who presents for follow-up.   Patient was diagnosed with A-fib versus atypical flutter with variable conduction in 02/2021 during admission.  Rates were hard to control and he ultimately underwent TEE cardioversion but quickly had recurrent arrhythmia.  He was started on amnio converted to sinus rhythm.  In February 2023 TSH was elevated to 28 which led to dose reduction of amiodarone 200 mg daily.  She was started on Synthroid by PCP.  Patient is on Eliquis for stroke prophylaxis.  At the time of A-fib diagnosis EF was noted to be low with an EF of 30 to 35%, suspected tachycardia mediated cardiomyopathy.  Repeat echo in August 2022 showed EF of 55 to 60%.  Patient was last seen November 2023 was overall stable from a cardiac perspective.  Today, the patient reports she is overall doing well. No changes sine the last visit. She denies chest pain, shortness of breath, lower leg edema, palpitations, orthopnea, pnd, lightheadedness or dizziness. She says she can no longer afford Eliquis. She had patient assistance last year and she needs to re-apply. We need to look into patient assistance. No bleeding issues with Eliquis.   Past Medical History:  Diagnosis Date   (HFpEF) heart failure with preserved ejection fraction    a. EF previously 30-35% in 02/2021 in the setting of afib w/RVR --> normalized by repeat imaging in 05/2021.   Anxiety    Atrial flutter    a. s/p DCCV in 02/2021   Atrial septal defect    Surgical repair in 1994   Cardiomyopathy    Chest pain     Normal coronary angiography in 1994 and 2011   Dizziness    Endometrial cancer, grade I 10/09/2019   Essential thrombocythemia 06/06/2016   GERD (gastroesophageal reflux disease)    History of kidney stones    Hyperlipidemia    Hypertension    LBBB (left bundle branch block) 2011   Mitral regurgitation    Palpitations    Persistent atrial fibrillation    PONV (postoperative nausea and vomiting)    Syncope    Tricuspid regurgitation    Type 2 diabetes mellitus with other specified complication [id number]   Ventricular tachycardia    Exercise induced    Past Surgical History:  Procedure Laterality Date   APPENDECTOMY     ASD [facility]   BREAST EXCISIONAL BIOPSY     Right and left   BREAST SURGERY     right and left breast-benign   CARDIAC CATHETERIZATION  12/24/2009   [name]    CARDIOVERSION N/A 03/04/2021   Procedure: CARDIOVERSION;  Surgeon: [name], MD;  Location: [facility];  Service: Endoscopy;  Laterality: N/A;   CATARACT EXTRACTION W/PHACO Left 03/11/2013   Procedure: CATARACT EXTRACTION PHACO AND INTRAOCULAR LENS PLACEMENT (IOC);  Surgeon: [name], MD;  Location: [facility];  Service: Ophthalmology;  Laterality: Left;  CDE:  12.20   HYSTEROSCOPY WITH D & C N/A 11/11/2019   Procedure: DILATATION AND CURETTAGE /  HYSTEROSCOPY;  Surgeon: [name], MD;  Location: [facility];  Service: Gynecology;  Laterality: N/A;   POLYPECTOMY N/A 11/11/2019   Procedure: POLYPECTOMY(ENDOMETRIAL POLYP);  Surgeon: [name], MD;  Location: [facility];  Service: Gynecology;  Laterality: N/A;   ROBOTIC ASSISTED LAPAROSCOPIC HYSTERECTOMY AND SALPINGECTOMY Bilateral 12/09/2019   Procedure: XI ROBOTIC ASSISTED LAPAROSCOPIC HYSTERECTOMY BILATERAL SALPINGOOOPHORECTOMY;  Surgeon: [name], MD;  Location: [facility];  Service: Gynecology;  Laterality: Bilateral;   SENTINEL NODE BIOPSY N/A 12/09/2019   Procedure: SENTINEL LYMPH  NODE BIOPSY;  Surgeon: [name], MD;  Location: [facility];  Service: Gynecology;  Laterality: N/A;   TEE WITHOUT CARDIOVERSION N/A 03/04/2021   Procedure: TRANSESOPHAGEAL ECHOCARDIOGRAM (TEE);  Surgeon: [name], MD;  Location: [facility];  Service: Endoscopy;  Laterality: N/A;   TONSILLECTOMY     TUBAL LIGATION     Bilateral    Current Medications: Current Meds  Medication Sig   Accu-Chek Softclix Lancets lancets 1 each by Other route daily at 12 noon. Use as instructed   amiodarone (PACERONE) 200 MG tablet TAKE 1/2 TABLET BY MOUTH DAILY   apixaban (ELIQUIS) 5 MG TABS tablet Take 1 tablet (5 mg total) by mouth 2 (two) times daily.   apixaban (ELIQUIS) 5 MG TABS tablet Take 1 tablet (5 mg total) by mouth 2 (two) times daily.   atorvastatin (LIPITOR) 40 MG tablet Take 1 tablet (40 mg total) by mouth at bedtime.   estradiol (ESTRACE) 0.1 MG/GM vaginal cream 1/2 inch in applicator per vagina 2 nights a week   fenofibrate 160 MG tablet TAKE 1 TABLET BY MOUTH EVERY DAY   glucose blood (ACCU-CHEK GUIDE) test strip 1 each by Other route daily. Use as instructed   hydroxyurea (HYDREA) 500 MG capsule Take 1 capsule (500 mg total) by mouth 2 (two) times daily.   ketoconazole (NIZORAL) 2 % cream Apply 1 application topically 2 (two) times daily.   levothyroxine (SYNTHROID) 75 MCG tablet Take 1 tablet (75 mcg total) by mouth daily before breakfast.   loratadine (CLARITIN) 10 MG tablet Take by mouth.   metFORMIN (GLUCOPHAGE) 500 MG tablet Take 1 tablet (500 mg total) by mouth 2 (two) times daily with a meal.   methenamine (HIPREX) 1 g tablet Take 1 tablet (1 g total) by mouth 2 (two) times daily with a meal.   metoprolol succinate (TOPROL-XL) 100 MG 24 hr tablet TAKE 1 TABLET BY MOUTH TWICE A DAY WITH FOOD   Multiple Vitamins-Minerals (MULTIVITAMIN WOMEN 50+) TABS Take by mouth daily.   naproxen sodium (ALEVE) 220 MG tablet Take 220 mg by mouth as needed.   omeprazole (PRILOSEC) 20 MG capsule Take 1 capsule (20 mg total) by  mouth daily.   sacubitril-valsartan (ENTRESTO) 24-26 MG TAKE 1 TABLET BY MOUTH TWICE A DAY     Allergies:   Codeine and Morphine   Social History   Socioeconomic History   Marital status: Widowed    Spouse name: widowed   Number of children: 3   Years of education: Not on file   Highest education level: Not on file  Occupational History   Occupation: [employer]  Tobacco Use   Smoking status: Never   Smokeless tobacco: Never  Vaping Use   Vaping Use: Never used  Substance and Sexual Activity   Alcohol use: No   Drug use: No   Sexual activity: Not Currently    Birth control/protection: Post-menopausal, Surgical    Comment: hyst  Other Topics  Concern   Not on file  Social History Narrative   Married and resides in [place]. 3 children. 3 grandchildren.   Sedentary lifestyle   Social Determinants of Health   Financial Resource Strain: Low Risk  (12/20/2022)   Overall Financial Resource Strain (CARDIA)    Difficulty of Paying Living Expenses: Not hard at all  Food Insecurity: No Food Insecurity (12/20/2022)   Hunger Vital Sign    Worried About Running Out of Food in the Last Year: Never true    Ran Out of Food in the Last Year: Never true  Transportation Needs: No Transportation Needs (12/20/2022)   PRAPARE - Hydrologist (Medical): No    Lack of Transportation (Non-Medical): No  Physical Activity: Sufficiently Active (12/20/2022)   Exercise Vital Sign    Days of Exercise per Week: 3 days    Minutes of Exercise per Session: 60 min  Stress: No Stress Concern Present (12/20/2022)   [place]    Feeling of Stress : Not at all  Social Connections: Moderately Isolated (12/20/2022)   Social Connection and Isolation Panel [NHANES]    Frequency of Communication with Friends and Family: More than three times a week    Frequency of Social Gatherings with Friends and Family: Once a  week    Attends Religious Services: More than 4 times per year    Active Member of [employer] or Organizations: No    Attends Archivist Meetings: Never    Marital Status: Widowed     Family History: The patient's family history includes Cancer in her mother and sister; Heart failure in her father; Stroke in her sister. There is no history of Coronary artery disease.  ROS:   Please see the history of present illness.     All other systems reviewed and are negative.  EKGs/Labs/Other Studies Reviewed:    The following studies were reviewed today:  Echo 05/2021  1. Left ventricular ejection fraction, by estimation, is 55 to 60%. The  left ventricle has normal function. The left ventricle has no regional  wall motion abnormalities. There is mild left ventricular hypertrophy.  Left ventricular diastolic parameters  were normal.   2. Right ventricular systolic function is normal. The right ventricular  size is normal. There is normal pulmonary artery systolic pressure. The  estimated right ventricular systolic pressure is [id number] mmHg.   3. Left atrial size was mildly dilated.   4. Right atrial size was moderately dilated.   5. The mitral valve is grossly normal. Moderate mitral valve  regurgitation.   6. Tricuspid valve regurgitation is moderate.   7. The aortic valve is tricuspid. There is mild calcification of the  aortic valve. Aortic valve regurgitation is not visualized. Mild to  moderate aortic valve sclerosis/calcification is present, without any  evidence of aortic stenosis. Aortic valve mean   gradient measures 9.0 mmHg. Aortic valve Vmax measures 2.01 m/s.   8. The inferior vena cava is normal in size with greater than 50%  respiratory variability, suggesting right atrial pressure of 3 mmHg.   Comparison(s): Prior images reviewed side by side. LVEF has improved  significantly.    EKG:  EKG is ordered today.  The ekg ordered today demonstrates NSR 74bpm, IVCD,  nonspecific ST/T wave changes  Recent Labs: 10/12/2022: TSH 2.690 01/03/2023: ALT 14; BUN 23; Creatinine, Ser 1.09; Hemoglobin 11.9; Platelets 356; Potassium 4.5; Sodium 137  Recent Lipid Panel  Component Value Date/Time   CHOL 184 10/12/2022 1546   TRIG 248 (H) 10/12/2022 1546   HDL 44 10/12/2022 1546   CHOLHDL 4.2 10/12/2022 1546   CHOLHDL 6.6 12/08/2017 0202   VLDL 73 (H) 12/08/2017 0202   LDLCALC 98 10/12/2022 1546     Physical Exam:    VS:  BP 126/64   Pulse 74   Ht 5\' 4"  (1.626 m)   Wt 167 lb (75.8 kg)   SpO2 97%   BMI 28.67 kg/m     Wt Readings from Last 3 Encounters:  01/29/23 167 lb (75.8 kg)  01/23/23 168 lb (76.2 kg)  01/11/23 168 lb (76.2 kg)     GEN:  Well nourished, well developed in no acute distress HEENT: Normal NECK: No JVD; No carotid bruits LYMPHATICS: No lymphadenopathy CARDIAC: RRR, no murmurs, rubs, gallops RESPIRATORY:  Clear to auscultation without rales, wheezing or rhonchi  ABDOMEN: Soft, non-tender, non-distended MUSCULOSKELETAL:  No edema; No deformity  SKIN: Warm and dry NEUROLOGIC:  Alert and oriented x 3 PSYCHIATRIC:  Normal affect   ASSESSMENT:    1. Paroxysmal atrial fibrillation   2. Heart failure with improved ejection fraction (HFimpEF)   3. Mitral valve insufficiency, unspecified etiology    PLAN:    In order of problems listed above:  Paroxysmal A-fib versus atypical a flutter The patient is in NSR on EKG today. She is unable to afford Eliquis without the patient assistance program. We will give her Eliquis samples today and re-apply for patient assistance. Continue Eliquis 5mg  BID for stroke ppx. Continue Toprol for rate control. Continue Amiodarone 100mg  daily. She follows with PCP for hypothyroidism on Synthroid, most recent TSH wnl.   HFimpEF History of suspected tachy-mediated cardiomyopathy. Echo from 2022 showed LVEF 55-60%. She is euvolemic on exam today. Continue Entresto 24-26mg BID and Toprol 100mg  daily.    MR Echo in 2022 showed moderate MR, repeat echo in 05/2023.   Disposition: Follow up in 6 month(s) with MD/APP    Signed, Johnaton Sonneborn Ninfa Meeker, PA-C  01/29/2023 3:11 PM    [facility]

## 2023-01-30 NOTE — Progress Notes (Unsigned)
Heather Barber, [place] [id number]   CLINIC:  Medical Oncology/Hematology  PCP:  Dettinger, Fransisca Kaufmann, MD [place] [phone]   REASON FOR VISIT:  Follow-up for JAK2 positive essential thrombocytosis   PRIOR THERAPY: None   CURRENT THERAPY: Hydrea 500 mg once daily   INTERVAL HISTORY:   Heather Barber 77 y.o. female returns for routine follow-up of her JAK2 positive essential thrombocytosis.  She was last seen by Tarri Abernethy PA-C on 02/13/2022, and was lost to follow-up for a few months after she missed her appointment in November 2023.  At today's visit, she reports feeling fairly.  No recent hospitalizations, surgeries, or changes in baseline health status.  She is tolerating Hydrea well at 500 mg daily.    She denies any GI symptoms such as nausea, vomiting, diarrhea.  She has not noted any mouth sores or skin ulcers.    Energy levels are stable with some mild fatigue.   She denies any erythromelalgia, aquagenic pruritus, Raynaud's phenomenon, or vasomotor symptoms.   No signs or symptoms of DVT or PE.  No B symptoms such as fever, chills, night sweats, unintentional weight loss.  She denies any pelvic pain or vaginal bleeding, in light of her history of previous endometrial cancer.  Regarding her anemia, she denies any chest pain, dyspnea on exertion, syncopal episodes.   She has not noticed any bleeding such as epistaxis, hematemesis, hematochezia, or melena.   She is taking iron tablet and vitamin B12 supplement daily.  She has  80% energy and  100% appetite. She endorses that she is maintaining a stable weight.   ASSESSMENT & PLAN:  1.  JAK2 positive essential thrombocytosis - High risk because of her advanced age. - Hydroxyurea 500 mg daily, currently tolerating well - No prior history of thrombosis.  She takes Eliquis for Afib. - She does not report any aquagenic pruritus or vasomotor symptoms.    - No  lymphadenopathy or splenomegaly on exam today (01/30/2022) - Most recent labs (01/03/2023): Platelets 356.  Hgb 11.9/MCV 105.6.  Normal WBC and differential. - PLAN: Continue Hydrea 500 mg daily     - RTC 4 months for repeat labs and follow-up.   2.  Anemia with iron deficiency and B12 deficiency - Labs in November 2022 showed mildly decreased Hgb 11.1, MCV 120.8 (macrocytosis secondary to Hydrea). - She was also noted to have mild iron deficiency (ferritin 56, iron saturation 17%, TIBC 508) and B12 deficiency (147) - She was suspected to have have some element of anemia secondary to Hydrea. - Additional labs for work-up of anemia were unremarkable (normal creatinine 0.86, normal folate, copper; SPEP and free light chains unremarkable) - She was started on oral iron supplementation and B12 supplementation in November 2022. - Most recent CBC (01/03/2023) showed Hgb 11.9/MCV 105.6 - No bright blood per rectum or melena.   - PLAN: Continue same dose of Hydrea as above. - Continue ferrous sulfate 325 mg daily    - Continue B12 supplement 1000 mcg daily   - Labs in 4 months to include iron and B12 panels   3.  Stage Ia grade 1 endometrioid adenocarcinoma - Status post robotic staging on 12/09/2019. - MMR normal. - Due to low risk of recurrence, no adjuvant therapy was recommended. - Seen by GYN in June 2023 due to isolated episode of vaginal bleeding, no concerns noted per GYN note  - No pelvic pain, B symptoms,  or abnormal vaginal bleeding at this time     - PLAN: Continue follow-up with GYN every 6 months for 5 years.   PLAN SUMMARY: >> Labs in 4 months = CBC/D, CMP, LDH, B12, MMA, ferritin, iron/TIBC >> OFFICE visit in 4 months (1 week after labs)     REVIEW OF SYSTEMS:   Review of Systems  Constitutional:  Positive for fatigue (mild). Negative for appetite change, chills, diaphoresis, fever and unexpected weight change.  HENT:   Negative for lump/mass and nosebleeds.   Eyes:  Negative for  eye problems.  Respiratory:  Negative for cough, hemoptysis and shortness of breath.   Cardiovascular:  Negative for chest pain, leg swelling and palpitations.  Gastrointestinal:  Negative for abdominal pain, blood in stool, constipation, diarrhea, nausea and vomiting.  Genitourinary:  Positive for dysuria (current UTI). Negative for hematuria.   Skin: Negative.   Neurological:  Negative for dizziness, headaches and light-headedness.  Hematological:  Does not bruise/bleed easily.     PHYSICAL EXAM:  ECOG PERFORMANCE STATUS: 0 - Asymptomatic  There were no vitals filed for this visit. There were no vitals filed for this visit. Physical Exam Constitutional:      Appearance: Normal appearance. She is obese.  Cardiovascular:     Heart sounds: Normal heart sounds.  Pulmonary:     Breath sounds: Normal breath sounds.  Neurological:     General: No focal deficit present.     Mental Status: Mental status is at baseline.  Psychiatric:        Behavior: Behavior normal. Behavior is cooperative.     PAST MEDICAL/SURGICAL HISTORY:  Past Medical History:  Diagnosis Date   (HFpEF) heart failure with preserved ejection fraction    a. EF previously 30-35% in 02/2021 in the setting of afib w/RVR --> normalized by repeat imaging in 05/2021.   Anxiety    Atrial flutter    a. s/p DCCV in 02/2021   Atrial septal defect    Surgical repair in 1994   Cardiomyopathy    Chest pain    Normal coronary angiography in 1994 and 2011   Dizziness    Endometrial cancer, grade I 10/09/2019   Essential thrombocythemia 06/06/2016   GERD (gastroesophageal reflux disease)    History of kidney stones    Hyperlipidemia    Hypertension    LBBB (left bundle branch block) 2011   Mitral regurgitation    Palpitations    Persistent atrial fibrillation    PONV (postoperative nausea and vomiting)    Syncope    Tricuspid regurgitation    Type 2 diabetes mellitus with other specified complication [id number]    Ventricular tachycardia    Exercise induced   Past Surgical History:  Procedure Laterality Date   APPENDECTOMY     ASD [facility]   BREAST EXCISIONAL BIOPSY     Right and left   BREAST SURGERY     right and left breast-benign   CARDIAC CATHETERIZATION  12/24/2009   [name]    CARDIOVERSION N/A 03/04/2021   Procedure: CARDIOVERSION;  Surgeon: [name], MD;  Location: [facility];  Service: Endoscopy;  Laterality: N/A;   CATARACT EXTRACTION W/PHACO Left 03/11/2013   Procedure: CATARACT EXTRACTION PHACO AND INTRAOCULAR LENS PLACEMENT (IOC);  Surgeon: [name], MD;  Location: [facility];  Service: Ophthalmology;  Laterality: Left;  CDE:  12.20   HYSTEROSCOPY WITH D & C N/A 11/11/2019   Procedure: DILATATION AND CURETTAGE /HYSTEROSCOPY;  Surgeon: [name], MD;  Location: [facility];  Service: Gynecology;  Laterality: N/A;   POLYPECTOMY N/A 11/11/2019   Procedure: POLYPECTOMY(ENDOMETRIAL POLYP);  Surgeon: [name], MD;  Location: [facility];  Service: Gynecology;  Laterality: N/A;   ROBOTIC ASSISTED LAPAROSCOPIC HYSTERECTOMY AND SALPINGECTOMY Bilateral 12/09/2019   Procedure: XI ROBOTIC ASSISTED LAPAROSCOPIC HYSTERECTOMY BILATERAL SALPINGOOOPHORECTOMY;  Surgeon: [name], MD;  Location: [facility];  Service: Gynecology;  Laterality: Bilateral;   SENTINEL NODE BIOPSY N/A 12/09/2019   Procedure: SENTINEL LYMPH  NODE BIOPSY;  Surgeon: [name], MD;  Location: [facility];  Service: Gynecology;  Laterality: N/A;   TEE WITHOUT CARDIOVERSION N/A 03/04/2021   Procedure: TRANSESOPHAGEAL ECHOCARDIOGRAM (TEE);  Surgeon: [name], MD;  Location: [facility];  Service: Endoscopy;  Laterality: N/A;   TONSILLECTOMY     TUBAL LIGATION     Bilateral    SOCIAL HISTORY:  Social History   Socioeconomic History   Marital status: Widowed    Spouse name: widowed   Number of children: 3   Years of education: Not on file   Highest education level: Not on file   Occupational History   Occupation: [employer]  Tobacco Use   Smoking status: Never   Smokeless tobacco: Never  Vaping Use   Vaping Use: Never used  Substance and Sexual Activity   Alcohol use: No   Drug use: No   Sexual activity: Not Currently    Birth control/protection: Post-menopausal, Surgical    Comment: hyst  Other Topics Concern   Not on file  Social History Narrative   Married and resides in [place]. 3 children. 3 grandchildren.   Sedentary lifestyle   Social Determinants of Health   Financial Resource Strain: Low Risk  (12/20/2022)   Overall Financial Resource Strain (CARDIA)    Difficulty of Paying Living Expenses: Not hard at all  Food Insecurity: No Food Insecurity (12/20/2022)   Hunger Vital Sign    Worried About Running Out of Food in the Last Year: Never true    Ran Out of Food in the Last Year: Never true  Transportation Needs: No Transportation Needs (12/20/2022)   PRAPARE - Hydrologist (Medical): No    Lack of Transportation (Non-Medical): No  Physical Activity: Sufficiently Active (12/20/2022)   Exercise Vital Sign    Days of Exercise per Week: 3 days    Minutes of Exercise per Session: 60 min  Stress: No Stress Concern Present (12/20/2022)   [place]    Feeling of Stress : Not at all  Social Connections: Moderately Isolated (12/20/2022)   Social Connection and Isolation Panel [NHANES]    Frequency of Communication with Friends and Family: More than three times a week    Frequency of Social Gatherings with Friends and Family: Once a week    Attends Religious Services: More than 4 times per year    Active Member of [employer] or Organizations: No    Attends Archivist Meetings: Never    Marital Status: Widowed  Intimate Partner Violence: Not At Risk (12/20/2022)   Humiliation, Afraid, Rape, and Kick questionnaire    Fear of Current or Ex-Partner:  No    Emotionally Abused: No    Physically Abused: No    Sexually Abused: No    FAMILY HISTORY:  Family History  Problem Relation Age of Onset   Cancer Mother        female  Heart failure Father    Stroke Sister    Cancer Sister        breast   Coronary artery disease Neg Hx     CURRENT MEDICATIONS:  Outpatient Encounter Medications as of 01/31/2023  Medication Sig   Accu-Chek Softclix Lancets lancets 1 each by Other route daily at 12 noon. Use as instructed   amiodarone (PACERONE) 200 MG tablet TAKE 1/2 TABLET BY MOUTH DAILY   apixaban (ELIQUIS) 5 MG TABS tablet Take 1 tablet (5 mg total) by mouth 2 (two) times daily.   apixaban (ELIQUIS) 5 MG TABS tablet Take 1 tablet (5 mg total) by mouth 2 (two) times daily.   atorvastatin (LIPITOR) 40 MG tablet Take 1 tablet (40 mg total) by mouth at bedtime.   estradiol (ESTRACE) 0.1 MG/GM vaginal cream 1/2 inch in applicator per vagina 2 nights a week   fenofibrate 160 MG tablet TAKE 1 TABLET BY MOUTH EVERY DAY   glucose blood (ACCU-CHEK GUIDE) test strip 1 each by Other route daily. Use as instructed   hydroxyurea (HYDREA) 500 MG capsule Take 1 capsule (500 mg total) by mouth 2 (two) times daily.   ketoconazole (NIZORAL) 2 % cream Apply 1 application topically 2 (two) times daily.   levothyroxine (SYNTHROID) 75 MCG tablet Take 1 tablet (75 mcg total) by mouth daily before breakfast.   loratadine (CLARITIN) 10 MG tablet Take by mouth.   metFORMIN (GLUCOPHAGE) 500 MG tablet Take 1 tablet (500 mg total) by mouth 2 (two) times daily with a meal.   methenamine (HIPREX) 1 g tablet Take 1 tablet (1 g total) by mouth 2 (two) times daily with a meal.   metoprolol succinate (TOPROL-XL) 100 MG 24 hr tablet TAKE 1 TABLET BY MOUTH TWICE A DAY WITH FOOD   Multiple Vitamins-Minerals (MULTIVITAMIN WOMEN 50+) TABS Take by mouth daily.   naproxen sodium (ALEVE) 220 MG tablet Take 220 mg by mouth as needed.   omeprazole (PRILOSEC) 20 MG capsule Take 1  capsule (20 mg total) by mouth daily.   sacubitril-valsartan (ENTRESTO) 24-26 MG TAKE 1 TABLET BY MOUTH TWICE A DAY   No facility-administered encounter medications on file as of 01/31/2023.    ALLERGIES:  Allergies  Allergen Reactions   Codeine Nausea And Vomiting   Morphine Other (See Comments)    GI symptoms    LABORATORY DATA:  I have reviewed the labs as listed.  CBC    Component Value Date/Time   WBC 5.8 01/03/2023 1254   RBC 3.39 (L) 01/03/2023 1254   HGB 11.9 (L) 01/03/2023 1254   HGB 12.2 10/12/2022 1546   HCT 35.8 (L) 01/03/2023 1254   HCT 34.2 10/12/2022 1546   PLT 356 01/03/2023 1254   PLT 355 10/12/2022 1546   MCV 105.6 (H) 01/03/2023 1254   MCV 99 (H) 10/12/2022 1546   MCH 35.1 (H) 01/03/2023 1254   MCHC 33.2 01/03/2023 1254   RDW 14.2 01/03/2023 1254   RDW 12.0 10/12/2022 1546   LYMPHSABS 1.7 01/03/2023 1254   LYMPHSABS 2.2 10/12/2022 1546   MONOABS 0.5 01/03/2023 1254   EOSABS 0.1 01/03/2023 1254   EOSABS 0.1 10/12/2022 1546   BASOSABS 0.0 01/03/2023 1254   BASOSABS 0.1 10/12/2022 1546      Latest Ref Rng & Units 01/03/2023   12:54 PM 10/12/2022    3:46 PM 08/21/2022    2:00 PM  CMP  Glucose 70 - 99 mg/dL 113  121  157   BUN 8 - 23  mg/dL 23  16  22    Creatinine 0.44 - 1.00 mg/dL 1.09  0.91  0.79   Sodium 135 - 145 mmol/L 137  139  140   Potassium 3.5 - 5.1 mmol/L 4.5  5.4  3.9   Chloride 98 - 111 mmol/L 103  100  103   CO2 22 - 32 mmol/L 24  27  27    Calcium 8.9 - 10.3 mg/dL 9.2  10.0  9.5   Total Protein 6.5 - 8.1 g/dL 7.1  6.8  7.2   Total Bilirubin 0.3 - 1.2 mg/dL 0.6  0.3  0.6   Alkaline Phos 38 - 126 U/L 48  62  48   AST 15 - 41 U/L 18  14  16    ALT 0 - 44 U/L 14  13  17      DIAGNOSTIC IMAGING:  I have independently reviewed the relevant imaging and discussed with the patient.   WRAP UP:  All questions were answered. The patient knows to call the clinic with any problems, questions or concerns.  Medical decision making:  Moderate  Time spent on visit: I spent 20 minutes counseling the patient face to face. The total time spent in the appointment was 30 minutes and more than 50% was on counseling.  Harriett Rush, PA-C  01/31/23 1:17 PM

## 2023-01-31 ENCOUNTER — Inpatient Hospital Stay: Payer: Medicare Other | Attending: Physician Assistant | Admitting: Physician Assistant

## 2023-01-31 ENCOUNTER — Other Ambulatory Visit: Payer: Self-pay

## 2023-01-31 VITALS — BP 124/53 | HR 71 | Temp 99.3°F | Resp 17 | Ht 64.0 in | Wt 167.3 lb

## 2023-01-31 DIAGNOSIS — Z803 Family history of malignant neoplasm of breast: Secondary | ICD-10-CM | POA: Insufficient documentation

## 2023-01-31 DIAGNOSIS — Z809 Family history of malignant neoplasm, unspecified: Secondary | ICD-10-CM | POA: Insufficient documentation

## 2023-01-31 DIAGNOSIS — Z1589 Genetic susceptibility to other disease: Secondary | ICD-10-CM

## 2023-01-31 DIAGNOSIS — E611 Iron deficiency: Secondary | ICD-10-CM

## 2023-01-31 DIAGNOSIS — D509 Iron deficiency anemia, unspecified: Secondary | ICD-10-CM | POA: Diagnosis not present

## 2023-01-31 DIAGNOSIS — D473 Essential (hemorrhagic) thrombocythemia: Secondary | ICD-10-CM

## 2023-01-31 DIAGNOSIS — E538 Deficiency of other specified B group vitamins: Secondary | ICD-10-CM | POA: Diagnosis not present

## 2023-01-31 DIAGNOSIS — Z79899 Other long term (current) drug therapy: Secondary | ICD-10-CM | POA: Diagnosis not present

## 2023-01-31 DIAGNOSIS — Z8542 Personal history of malignant neoplasm of other parts of uterus: Secondary | ICD-10-CM | POA: Insufficient documentation

## 2023-01-31 NOTE — Patient Instructions (Signed)
[place] at [facility] Discharge Instructions  You were seen today by Tarri Abernethy PA-C for your elevated platelets and mild anemia.  Your platelet levels look great!  Continue hydroxyurea (Hydrea) to 500 mg (1 tablet) once per day.  We will recheck your levels again in 4 months.  LABS: Return in 4 months for repeat labs  MEDICATIONS: - Continue Hydrea to 500 mg (1 tablet) once daily - Continue ferrous sulfate (iron supplement) 325 mg daily.   - Continue vitamin B12 (cyanocobalamin) 500 mcg (0.5 mg) daily.  FOLLOW-UP APPOINTMENT: Office visit in 4 months with labs the week before   Thank you for choosing [place] at [facility] to provide your oncology and hematology care.  To afford each patient quality time with our provider, please arrive at least 15 minutes before your scheduled appointment time.   If you have a lab appointment with the [facility] please come in thru the Main Entrance and check in at the main information desk.  You need to re-schedule your appointment should you arrive 10 or more minutes late.  We strive to give you quality time with our providers, and arriving late affects you and other patients whose appointments are after yours.  Also, if you no show three or more times for appointments you may be dismissed from the clinic at the providers discretion.     Again, thank you for choosing [facility].  Our hope is that these requests will decrease the amount of time that you wait before being seen by our physicians.       _____________________________________________________________  Should you have questions after your visit to [facility], please contact our office at [phone] and follow the prompts.  Our office hours are 8:00 a.m. and 4:30 p.m. Monday - Friday.  Please note that voicemails left after 4:00 p.m. may not be returned until the following business day.  We are closed  weekends and major holidays.  You do have access to a nurse 24-7, just call the main number to the clinic [phone] and do not press any options, hold on the line and a nurse will answer the phone.    For prescription refill requests, have your pharmacy contact our office and allow 72 hours.    Due to Covid, you will need to wear a mask upon entering the hospital. If you do not have a mask, a mask will be given to you at the Main Entrance upon arrival. For doctor visits, patients may have 1 support person age 19 or older with them. For treatment visits, patients can not have anyone with them due to social distancing guidelines and our immunocompromised population.

## 2023-02-14 ENCOUNTER — Encounter: Payer: Self-pay | Admitting: Pharmacist

## 2023-02-14 ENCOUNTER — Ambulatory Visit (INDEPENDENT_AMBULATORY_CARE_PROVIDER_SITE_OTHER): Payer: Medicare Other | Admitting: Pharmacist

## 2023-02-14 VITALS — BP 129/75 | HR 76

## 2023-02-14 DIAGNOSIS — I10 Essential (primary) hypertension: Secondary | ICD-10-CM

## 2023-02-14 DIAGNOSIS — I447 Left bundle-branch block, unspecified: Secondary | ICD-10-CM

## 2023-02-14 NOTE — Progress Notes (Signed)
    Pharmacy Note   02/14/2023 Name:  YANELY MAST           MRN:  [id number]      DOB:  [birth year]   Summary: PMH per cards notes: ASD (s/p repair in 1994), thrombocytosis, HTN, HLD, valvular heart disease and recently diagnosed atrial fibrillation with associated cardiomyopathy (diagnosed in 02/2021 with echo showing EF of 30-35%)   Atrial Fibrillation/ CHF LVEF EF30-35%: Controlled; current rate/rhythm control: amiodarone, metoprolol; anticoagulant treatment: ELIQUIS;  Patient also on Entresto for CHF CHADS2VASc score: at least 4 Home blood pressure, heart rate readings: controlled Educated on taking BP at home, aware of symptoms; denies signs & symptoms of bleeding; BP at goal per patient report <130/80 Assessed patient finances.  Patient is not eligible for Eliquis BMS patient assistance until she meets 3% out of pocket Patient re-enrolled for [employer] grant until 02/2024 -- will mail her new card to her Patient aware to take [employer] grant copay card to pharmacy to pick up Helen Hashimoto, PharmD, BCACP Clinical Pharmacist, [facility]

## 2023-02-28 ENCOUNTER — Other Ambulatory Visit: Payer: Self-pay | Admitting: Cardiology

## 2023-02-28 NOTE — Telephone Encounter (Signed)
Prescription refill request for Eliquis received. Indication: AF Last office visit: 01/29/23  Peggyann Juba PA-C Scr: 1.09 on 01/03/23 Age: 77 Weight: 75.8kg  Based on above findings Eliquis 5mg  twice daily is the appropriate dose.  Refill approved.

## 2023-03-29 ENCOUNTER — Encounter: Payer: Self-pay | Admitting: Adult Health

## 2023-03-29 ENCOUNTER — Ambulatory Visit (INDEPENDENT_AMBULATORY_CARE_PROVIDER_SITE_OTHER): Payer: Medicare Other | Admitting: Adult Health

## 2023-03-29 VITALS — BP 121/62 | HR 63 | Ht 64.0 in | Wt 170.2 lb

## 2023-03-29 DIAGNOSIS — R350 Frequency of micturition: Secondary | ICD-10-CM | POA: Diagnosis not present

## 2023-03-29 DIAGNOSIS — C541 Malignant neoplasm of endometrium: Secondary | ICD-10-CM | POA: Diagnosis not present

## 2023-03-29 DIAGNOSIS — Z01419 Encounter for gynecological examination (general) (routine) without abnormal findings: Secondary | ICD-10-CM

## 2023-03-29 DIAGNOSIS — Z1211 Encounter for screening for malignant neoplasm of colon: Secondary | ICD-10-CM | POA: Diagnosis not present

## 2023-03-29 DIAGNOSIS — R319 Hematuria, unspecified: Secondary | ICD-10-CM

## 2023-03-29 LAB — POCT URINALYSIS DIPSTICK
Bilirubin, UA: NEGATIVE
Glucose, UA: NEGATIVE
Ketones, UA: NEGATIVE
Nitrite, UA: NEGATIVE
Protein, UA: POSITIVE — AB
Spec Grav, UA: >=1.03 — AB (ref 1.010–1.025)
pH, UA: 6.5 (ref 5.0–8.0)

## 2023-03-29 LAB — HEMOCCULT GUIAC POC 1CARD (OFFICE): Fecal Occult Blood, POC: NEGATIVE

## 2023-03-29 NOTE — Progress Notes (Signed)
[name], female   DOB: 05-Sep-1946, 77 y.o.   MRN: [id number] History of Present Illness: Heather Barber is a 77 year old white female, widowed, with a history of stage IA grade 1 endometrioid endometrial adenocarcinoma, s/p robotic staging on 12/09/19, she is in today for breast exam and  pelvic exam.  She gets pelvic exams every 6 months for 5 years.  Physical with PCP. She is still working at [employer] in [place].    PCP is Dr Dettinger   Current Medications, Allergies, Past Medical History, Past Surgical History, Family History and Social History were reviewed in [employer] record.     Review of Systems: She denies any vaginal bleeding Patient denies any headaches, hearing loss, fatigue, blurred vision, shortness of breath, chest pain, abdominal pain, problems with bowel movements, urination,(has frequent UTIs) has to void often, or intercourse(not active). No joint pain or mood swings.     Physical Exam:BP 121/62 (BP Location: Right Arm, Patient Position: Sitting, Cuff Size: Normal)   Pulse 63   Ht 5\' 4"  (1.626 m)   Wt 170 lb 3.2 oz (77.2 kg)   BMI 29.21 kg/m  urine dipstick large blood, positive protein and small leuks.  General:  Well developed, well nourished, no acute distress Skin:  Warm and dry Neck:  Midline trachea, normal thyroid, good ROM, no lymphadenopathy,no carotid bruits heard Lungs; Clear to auscultation bilaterally Breast:  No dominant palpable mass, retraction, or nipple discharge Cardiovascular: Regular rate and rhythm Abdomen:  Soft, non tender, no hepatosplenomegaly Pelvic:  External genitalia is normal in appearance, no lesions.  The vagina is pale, no lesions seen. Vaginal cuff looks good, atrophic. Urethra has no lesions or masses. The cervix and uterus are absent. No adnexal masses or tenderness noted.Bladder is non tender, no masses felt. Rectal: Good sphincter tone, no polyps, or hemorrhoids felt.  Hemoccult  negative. Extremities/musculoskeletal:  No swelling or varicosities noted, no clubbing or cyanosis Psych:  No mood changes, alert and cooperative,seems happy AA is 0 Fall risk is low    03/29/2023    3:11 PM 01/11/2023    4:03 PM 12/20/2022   10:58 AM  Depression screen PHQ 2/9  Decreased Interest 0 0 0  Down, Depressed, Hopeless 0 0 0  PHQ - 2 Score 0 0 0  Altered sleeping 0 0   Tired, decreased energy 0 0   Change in appetite 0 0   Feeling bad or failure about yourself  0 0   Trouble concentrating 0 0   Moving slowly or fidgety/restless 0 0   Suicidal thoughts 0 0   PHQ-9 Score 0 0   Difficult doing work/chores  Not difficult at all        03/29/2023    3:11 PM 01/11/2023    4:03 PM 12/12/2022    3:47 PM 11/01/2022    2:16 PM  GAD 7 : Generalized Anxiety Score  Nervous, Anxious, on Edge 0 0 0 0  Control/stop worrying 0 0 0 0  Worry too much - different things 0 0 0 0  Trouble relaxing 0 0 0 0  Restless 0 0 0 0  Easily annoyed or irritable 0 0 0 0  Afraid - awful might happen 0 0 0 0  Total GAD 7 Score 0 0 0 0  Anxiety Difficulty  Not difficult at all Not difficult at all Not difficult at all     Examination chaperoned by Juliane Lack CMA   Impression and  Plan: 1. Visit for pelvic exam Normal exam No lesions or vaginal bleeding  Return in 6 months for pelvic exam  2. Endometrial cancer, grade I ([name]) No bleeding No lesions seen  Pelvic exam in 6 months   3. Encounter for screening fecal occult blood testing Hemoccult was negative   4. Urinary frequency +frequency She says she sees Dr Retta Diones, will call him  - POCT urinalysis dipstick  5. Hematuria, unspecified type Large blood in urine she sees Dr Retta Diones and will call him - POCT urinalysis dipstick

## 2023-04-13 ENCOUNTER — Ambulatory Visit (INDEPENDENT_AMBULATORY_CARE_PROVIDER_SITE_OTHER): Payer: Medicare Other | Admitting: Family Medicine

## 2023-04-13 ENCOUNTER — Encounter: Payer: Self-pay | Admitting: Family Medicine

## 2023-04-13 VITALS — BP 126/67 | HR 71 | Ht 64.0 in | Wt 171.0 lb

## 2023-04-13 DIAGNOSIS — E1159 Type 2 diabetes mellitus with other circulatory complications: Secondary | ICD-10-CM

## 2023-04-13 DIAGNOSIS — E1169 Type 2 diabetes mellitus with other specified complication: Secondary | ICD-10-CM

## 2023-04-13 DIAGNOSIS — I152 Hypertension secondary to endocrine disorders: Secondary | ICD-10-CM

## 2023-04-13 DIAGNOSIS — E781 Pure hyperglyceridemia: Secondary | ICD-10-CM

## 2023-04-13 DIAGNOSIS — E039 Hypothyroidism, unspecified: Secondary | ICD-10-CM

## 2023-04-13 DIAGNOSIS — I10 Essential (primary) hypertension: Secondary | ICD-10-CM

## 2023-04-13 DIAGNOSIS — Z7984 Long term (current) use of oral hypoglycemic drugs: Secondary | ICD-10-CM

## 2023-04-13 DIAGNOSIS — E782 Mixed hyperlipidemia: Secondary | ICD-10-CM

## 2023-04-13 LAB — BAYER DCA HB A1C WAIVED: HB A1C (BAYER DCA - WAIVED): 6.5 % — ABNORMAL HIGH (ref 4.8–5.6)

## 2023-04-13 NOTE — Progress Notes (Signed)
BP 126/67   Pulse 71   Ht 5\' 4"  (1.626 m)   Wt 171 lb (77.6 kg)   SpO2 94%   BMI 29.35 kg/m    Subjective:   [name], female    DOB: [birth year], 77 y.o.   MRN: [id number]  HPI: Heather Barber is a 77 y.o. female presenting on 04/13/2023 for Medical Management of Chronic Issues, Hyperlipidemia, Diabetes, and Hypothyroidism   HPI Type 2 diabetes mellitus Patient comes in today for recheck of his diabetes. Patient has been currently taking metformin. Patient is not currently on an ACE inhibitor/ARB. Patient has not seen an ophthalmologist this year. Patient denies any new issues with their feet. The symptom started onset as an adult hypertension and hyperlipidemia and A-fib and CHF ARE RELATED TO DM   Hypertension and A-fib and CHF, sees cardiology Patient is currently on amiodarone and Eliquis and metoprolol and Entresto, and their blood pressure today is 126/67. Patient denies any lightheadedness or dizziness. Patient denies headaches, blurred vision, chest pains, shortness of breath, or weakness. Denies any side effects from medication and is content with current medication.   Hyperlipidemia and hypertriglyceridemia. Patient is coming in for recheck of his hyperlipidemia. The patient is currently taking Lipitor and fenofibrate. They deny any issues with myalgias or history of liver damage from it. They deny any focal numbness or weakness or chest pain.   Hypothyroidism recheck Patient is coming in for thyroid recheck today as well. They deny any issues with hair changes or heat or cold problems or diarrhea or constipation. They deny any chest pain or palpitations. They are currently on levothyroxine 75 micrograms   Relevant past medical, surgical, family and social history reviewed and updated as indicated. Interim medical history since our last visit reviewed. Allergies and medications reviewed and updated.  Review of Systems  Constitutional:  Negative for chills  and fever.  Eyes:  Negative for visual disturbance.  Respiratory:  Negative for chest tightness and shortness of breath.   Cardiovascular:  Negative for chest pain and leg swelling.  Genitourinary:  Negative for difficulty urinating and dysuria.  Musculoskeletal:  Negative for back pain and gait problem.  Skin:  Negative for rash.  Neurological:  Negative for dizziness, light-headedness and headaches.  Psychiatric/Behavioral:  Negative for agitation and behavioral problems.   All other systems reviewed and are negative.   Per HPI unless specifically indicated above   Allergies as of 04/13/2023       Reactions   Codeine Nausea And Vomiting   Morphine Other (See Comments)   GI symptoms        Medication List        Accurate as of April 13, 2023  3:58 PM. If you have any questions, ask your nurse or doctor.          Accu-Chek Guide test strip Generic drug: glucose blood 1 each by Other route daily. Use as instructed   Accu-Chek Softclix Lancets lancets 1 each by Other route daily at 12 noon. Use as instructed   amiodarone 200 MG tablet Commonly known as: PACERONE TAKE 1/2 TABLET BY MOUTH DAILY   apixaban 5 MG Tabs tablet Commonly known as: ELIQUIS Take 1 tablet (5 mg total) by mouth 2 (two) times daily.   apixaban 5 MG Tabs tablet Commonly known as: ELIQUIS Take 1 tablet (5 mg total) by mouth 2 (two) times daily.   Eliquis 5 MG Tabs tablet Generic drug: apixaban TAKE 1 TABLET  BY MOUTH TWICE DAILY   atorvastatin 40 MG tablet Commonly known as: LIPITOR Take 1 tablet (40 mg total) by mouth at bedtime.   Entresto 24-26 MG Generic drug: sacubitril-valsartan TAKE 1 TABLET BY MOUTH TWICE A DAY   estradiol 0.1 MG/GM vaginal cream Commonly known as: ESTRACE 1/2 inch in applicator per vagina 2 nights a week   fenofibrate 160 MG tablet TAKE 1 TABLET BY MOUTH EVERY DAY   hydroxyurea 500 MG capsule Commonly known as: HYDREA Take 1 capsule (500 mg total) by  mouth 2 (two) times daily.   ketoconazole 2 % cream Commonly known as: NIZORAL Apply 1 application topically 2 (two) times daily.   levothyroxine 75 MCG tablet Commonly known as: SYNTHROID Take 1 tablet (75 mcg total) by mouth daily before breakfast.   loratadine 10 MG tablet Commonly known as: CLARITIN Take by mouth.   metFORMIN 500 MG tablet Commonly known as: GLUCOPHAGE Take 1 tablet (500 mg total) by mouth 2 (two) times daily with a meal.   methenamine 1 g tablet Commonly known as: HIPREX Take 1 tablet (1 g total) by mouth 2 (two) times daily with a meal.   metoprolol succinate 100 MG 24 hr tablet Commonly known as: TOPROL-XL TAKE 1 TABLET BY MOUTH TWICE A DAY WITH FOOD   Multivitamin Women 50+ Tabs Take by mouth daily.   naproxen sodium 220 MG tablet Commonly known as: ALEVE Take 220 mg by mouth as needed.   omeprazole 20 MG capsule Commonly known as: PRILOSEC Take 1 capsule (20 mg total) by mouth daily.         Objective:   BP 126/67   Pulse 71   Ht 5\' 4"  (1.626 m)   Wt 171 lb (77.6 kg)   SpO2 94%   BMI 29.35 kg/m   Wt Readings from Last 3 Encounters:  04/13/23 171 lb (77.6 kg)  03/29/23 170 lb 3.2 oz (77.2 kg)  01/31/23 167 lb 5.3 oz (75.9 kg)    Physical Exam Vitals and nursing note reviewed.  Constitutional:      General: She is not in acute distress.    Appearance: She is well-developed. She is not diaphoretic.  Eyes:     Conjunctiva/sclera: Conjunctivae normal.  Cardiovascular:     Rate and Rhythm: Normal rate and regular rhythm.     Heart sounds: Normal heart sounds. No murmur heard. Pulmonary:     Effort: Pulmonary effort is normal. No respiratory distress.     Breath sounds: Normal breath sounds. No wheezing.  Musculoskeletal:        General: No swelling or tenderness. Normal range of motion.  Skin:    General: Skin is warm and dry.     Findings: No rash.  Neurological:     Mental Status: She is alert and oriented to person,  place, and time.     Coordination: Coordination normal.  Psychiatric:        Behavior: Behavior normal.       Assessment & Plan:   Problem List Items Addressed This Visit       Cardiovascular and Mediastinum   Hypertension     Endocrine   Type 2 diabetes mellitus with other specified complication ([name]) - Primary   Relevant Orders   CBC with Differential/Platelet   Lipid panel   Bayer DCA Hb A1c Waived   TSH   Hypothyroidism   Relevant Orders   CBC with Differential/Platelet   Lipid panel   Bayer DCA Hb A1c Waived  TSH     Other   Hyperlipidemia   Hypertriglyceridemia    A1c 6.5, looks better than last time, keep up the good work.  Blood pressure everything looks good today.  No changes Follow up plan: Return in about 3 months (around 07/14/2023), or if symptoms worsen or fail to improve, for Diabetes recheck.  Counseling provided for all of the vaccine components Orders Placed This Encounter  Procedures   CBC with Differential/Platelet   Lipid panel   Bayer DCA Hb A1c Waived   TSH    Arville Care, MD [facility] 04/13/2023, 3:58 PM

## 2023-04-14 LAB — LIPID PANEL
Chol/HDL Ratio: 3.8 ratio (ref 0.0–4.4)
Cholesterol, Total: 168 mg/dL (ref 100–199)
HDL: 44 mg/dL (ref >39)
LDL Chol Calc (NIH): 93 mg/dL (ref 0–99)
Triglycerides: 177 mg/dL — ABNORMAL HIGH (ref 0–149)
VLDL Cholesterol Cal: 31 mg/dL (ref 5–40)

## 2023-04-14 LAB — CBC WITH DIFFERENTIAL/PLATELET
Basophils Absolute: 0 10*3/uL (ref 0.0–0.2)
Basos: 1 %
EOS (ABSOLUTE): 0.1 10*3/uL (ref 0.0–0.4)
Eos: 1 %
Hematocrit: 32 % — ABNORMAL LOW (ref 34.0–46.6)
Hemoglobin: 11.2 g/dL (ref 11.1–15.9)
Immature Grans (Abs): 0 10*3/uL (ref 0.0–0.1)
Immature Granulocytes: 1 %
Lymphocytes Absolute: 2.1 10*3/uL (ref 0.7–3.1)
Lymphs: 35 %
MCH: 35.2 pg — ABNORMAL HIGH (ref 26.6–33.0)
MCHC: 35 g/dL (ref 31.5–35.7)
MCV: 101 fL — ABNORMAL HIGH (ref 79–97)
Monocytes Absolute: 0.4 10*3/uL (ref 0.1–0.9)
Monocytes: 7 %
Neutrophils Absolute: 3.5 10*3/uL (ref 1.4–7.0)
Neutrophils: 55 %
Platelets: 359 10*3/uL (ref 150–450)
RBC: 3.18 x10E6/uL — ABNORMAL LOW (ref 3.77–5.28)
RDW: 12.2 % (ref 11.7–15.4)
WBC: 6.1 10*3/uL (ref 3.4–10.8)

## 2023-04-14 LAB — TSH: TSH: 3.05 u[IU]/mL (ref 0.450–4.500)

## 2023-04-16 ENCOUNTER — Other Ambulatory Visit: Payer: Self-pay | Admitting: Family Medicine

## 2023-04-16 DIAGNOSIS — D473 Essential (hemorrhagic) thrombocythemia: Secondary | ICD-10-CM

## 2023-04-23 NOTE — Progress Notes (Signed)
History of Present Illness: Pt returns for f/u of recurrent UTIs. Initial visit 3.26.2024. Placed on estrace perivaginal cream, probiotics, methenamine suppression x 3 mos.  6.25.2024: Occasional dysuria.  No gross hematuria.  Overall, she thinks she is doing well compared to before her first visit.  She is using her estrogen cream twice a week and continues on the methenamine.    Past Medical History:  Diagnosis Date   (HFpEF) heart failure with preserved ejection fraction ([name])    a. EF previously 30-35% in 02/2021 in the setting of afib w/RVR --> normalized by repeat imaging in 05/2021.   Anxiety    Atrial flutter ([name])    a. s/p DCCV in 02/2021   Atrial septal defect    Surgical repair in 1994   Cardiomyopathy [facility])    Chest pain    Normal coronary angiography in 1994 and 2011   Dizziness    Endometrial cancer, grade I ([name]) 10/09/2019   Essential thrombocythemia ([name]) 06/06/2016   GERD (gastroesophageal reflux disease)    History of kidney stones    Hyperlipidemia    Hypertension    LBBB (left bundle branch block) 2011   Mitral regurgitation    Palpitations    Persistent atrial fibrillation ([name])    PONV (postoperative nausea and vomiting)    Syncope    Tricuspid regurgitation    Type 2 diabetes mellitus with other specified complication ([name]) 07/18/2019   Ventricular tachycardia ([name])    Exercise induced    Past Surgical History:  Procedure Laterality Date   APPENDECTOMY     ASD REPAIR  1994   [facility]   BREAST EXCISIONAL BIOPSY     Right and left   BREAST SURGERY     right and left breast-benign   CARDIAC CATHETERIZATION  12/24/2009   Dr. Excell Seltzer    CARDIOVERSION N/A 03/04/2021   Procedure: CARDIOVERSION;  Surgeon: Antoine Poche, MD;  Location: [facility];  Service: Endoscopy;  Laterality: N/A;   CATARACT EXTRACTION W/PHACO Left 03/11/2013   Procedure: CATARACT EXTRACTION PHACO AND INTRAOCULAR LENS PLACEMENT (IOC);  Surgeon: Loraine Leriche T. Nile Riggs, MD;   Location: [facility];  Service: Ophthalmology;  Laterality: Left;  CDE:  12.20   HYSTEROSCOPY WITH D & C N/A 11/11/2019   Procedure: DILATATION AND CURETTAGE /HYSTEROSCOPY;  Surgeon: Tilda Burrow, MD;  Location: [facility];  Service: Gynecology;  Laterality: N/A;   POLYPECTOMY N/A 11/11/2019   Procedure: POLYPECTOMY(ENDOMETRIAL POLYP);  Surgeon: Tilda Burrow, MD;  Location: [facility];  Service: Gynecology;  Laterality: N/A;   ROBOTIC ASSISTED LAPAROSCOPIC HYSTERECTOMY AND SALPINGECTOMY Bilateral 12/09/2019   Procedure: XI ROBOTIC ASSISTED LAPAROSCOPIC HYSTERECTOMY BILATERAL SALPINGOOOPHORECTOMY;  Surgeon: Adolphus Birchwood, MD;  Location: [facility];  Service: Gynecology;  Laterality: Bilateral;   SENTINEL NODE BIOPSY N/A 12/09/2019   Procedure: SENTINEL LYMPH  NODE BIOPSY;  Surgeon: Adolphus Birchwood, MD;  Location: [facility];  Service: Gynecology;  Laterality: N/A;   TEE WITHOUT CARDIOVERSION N/A 03/04/2021   Procedure: TRANSESOPHAGEAL ECHOCARDIOGRAM (TEE);  Surgeon: Antoine Poche, MD;  Location: [facility];  Service: Endoscopy;  Laterality: N/A;   TONSILLECTOMY     TUBAL LIGATION     Bilateral    Home Medications:  (Not in a hospital admission)   Allergies:  Allergies  Allergen Reactions   Codeine Nausea And Vomiting   Morphine Other (See Comments)    GI symptoms    Family History  Problem Relation Age of Onset   Cancer Mother  female   Heart failure Father    Stroke Sister    Cancer Sister        breast   Coronary artery disease Neg Hx     Social History:  reports that she has never smoked. She has never used smokeless tobacco. She reports that she does not drink alcohol and does not use drugs.  ROS: A complete review of systems was performed.  All systems are negative except for pertinent findings as noted.  Physical Exam:  Vital signs in last 24 hours: @VSRANGES @ General:  Alert and oriented, No acute distress HEENT: Normocephalic, atraumatic Neck: No JVD or  lymphadenopathy Cardiovascular: Regular rate  Lungs: Normal inspiratory/expiratory excursion Extremities: No edema Neurologic: Grossly intact  I have reviewed prior pt notes  I have reviewed urinalysis results continued microscopic hematuria  I have reviewed prior urine cultures  Bladder scan reviewed  Impression/Assessment:  1.  Recurrent cystitis in postmenopausal female  2.  Vaginal atrophic changes  3.  Microscopic hematuria  Plan:  1.  Continue previous medical therapy  2.  Basic metabolic panel drawn today in advance of hematuria protocol CT abdomen/pelvis with and without contrast  Office visit here after that for cystoscopy   Heather Barber 04/23/2023, 10:01 PM  Heather Millard. Sven Pinheiro MD

## 2023-04-24 ENCOUNTER — Encounter: Payer: Self-pay | Admitting: Urology

## 2023-04-24 ENCOUNTER — Ambulatory Visit (INDEPENDENT_AMBULATORY_CARE_PROVIDER_SITE_OTHER): Payer: Medicare Other | Admitting: Urology

## 2023-04-24 VITALS — BP 154/78 | HR 71

## 2023-04-24 DIAGNOSIS — N3001 Acute cystitis with hematuria: Secondary | ICD-10-CM

## 2023-04-24 DIAGNOSIS — R3129 Other microscopic hematuria: Secondary | ICD-10-CM

## 2023-04-24 DIAGNOSIS — N952 Postmenopausal atrophic vaginitis: Secondary | ICD-10-CM | POA: Diagnosis not present

## 2023-04-24 DIAGNOSIS — R300 Dysuria: Secondary | ICD-10-CM

## 2023-04-24 DIAGNOSIS — N3000 Acute cystitis without hematuria: Secondary | ICD-10-CM

## 2023-04-25 LAB — URINALYSIS, ROUTINE W REFLEX MICROSCOPIC
Bilirubin, UA: NEGATIVE
Glucose, UA: NEGATIVE
Ketones, UA: NEGATIVE
Nitrite, UA: NEGATIVE
Specific Gravity, UA: 1.03 (ref 1.005–1.030)
Urobilinogen, Ur: 0.2 mg/dL (ref 0.2–1.0)
pH, UA: 6 (ref 5.0–7.5)

## 2023-04-25 LAB — BASIC METABOLIC PANEL
BUN/Creatinine Ratio: 17 (ref 12–28)
BUN: 17 mg/dL (ref 8–27)
CO2: 23 mmol/L (ref 20–29)
Calcium: 10 mg/dL (ref 8.7–10.3)
Chloride: 102 mmol/L (ref 96–106)
Creatinine, Ser: 1 mg/dL (ref 0.57–1.00)
Glucose: 114 mg/dL — ABNORMAL HIGH (ref 70–99)
Potassium: 5 mmol/L (ref 3.5–5.2)
Sodium: 140 mmol/L (ref 134–144)
eGFR: 58 mL/min/{1.73_m2} — ABNORMAL LOW (ref >59)

## 2023-04-25 LAB — MICROSCOPIC EXAMINATION
Bacteria, UA: NONE SEEN
RBC, Urine: >30 /hpf — AB (ref 0–2)

## 2023-05-02 ENCOUNTER — Ambulatory Visit (HOSPITAL_BASED_OUTPATIENT_CLINIC_OR_DEPARTMENT_OTHER)
Admission: RE | Admit: 2023-05-02 | Discharge: 2023-05-02 | Disposition: A | Discharge status: Home / Self Care | Payer: Medicare Other | Source: Ambulatory Visit | Attending: Urology | Admitting: Urology

## 2023-05-02 DIAGNOSIS — R3129 Other microscopic hematuria: Secondary | ICD-10-CM | POA: Insufficient documentation

## 2023-05-02 MED ORDER — IOHEXOL 300 MG/ML  SOLN
100.0000 mL | Freq: Once | INTRAMUSCULAR | Status: AC | PRN
  Administered 2023-05-02: 100 mL via INTRAVENOUS

## 2023-05-21 ENCOUNTER — Other Ambulatory Visit: Payer: Self-pay | Admitting: Cardiology

## 2023-06-01 ENCOUNTER — Inpatient Hospital Stay: Payer: Medicare Other | Attending: Physician Assistant

## 2023-06-01 DIAGNOSIS — I4891 Unspecified atrial fibrillation: Secondary | ICD-10-CM | POA: Diagnosis not present

## 2023-06-01 DIAGNOSIS — Z809 Family history of malignant neoplasm, unspecified: Secondary | ICD-10-CM | POA: Diagnosis not present

## 2023-06-01 DIAGNOSIS — D75839 Thrombocytosis, unspecified: Secondary | ICD-10-CM | POA: Insufficient documentation

## 2023-06-01 DIAGNOSIS — D509 Iron deficiency anemia, unspecified: Secondary | ICD-10-CM | POA: Insufficient documentation

## 2023-06-01 DIAGNOSIS — D473 Essential (hemorrhagic) thrombocythemia: Secondary | ICD-10-CM | POA: Diagnosis present

## 2023-06-01 DIAGNOSIS — Z7901 Long term (current) use of anticoagulants: Secondary | ICD-10-CM | POA: Diagnosis not present

## 2023-06-01 DIAGNOSIS — E538 Deficiency of other specified B group vitamins: Secondary | ICD-10-CM

## 2023-06-01 DIAGNOSIS — Z803 Family history of malignant neoplasm of breast: Secondary | ICD-10-CM | POA: Diagnosis not present

## 2023-06-01 DIAGNOSIS — Z1589 Genetic susceptibility to other disease: Secondary | ICD-10-CM

## 2023-06-01 DIAGNOSIS — E611 Iron deficiency: Secondary | ICD-10-CM

## 2023-06-01 DIAGNOSIS — Z8542 Personal history of malignant neoplasm of other parts of uterus: Secondary | ICD-10-CM | POA: Diagnosis not present

## 2023-06-01 DIAGNOSIS — Z79899 Other long term (current) drug therapy: Secondary | ICD-10-CM | POA: Insufficient documentation

## 2023-06-01 DIAGNOSIS — D519 Vitamin B12 deficiency anemia, unspecified: Secondary | ICD-10-CM | POA: Diagnosis not present

## 2023-06-01 LAB — VITAMIN B12: Vitamin B-12: 142 pg/mL — ABNORMAL LOW (ref 180–914)

## 2023-06-01 LAB — CBC WITH DIFFERENTIAL/PLATELET
Abs Immature Granulocytes: 0.04 10*3/uL (ref 0.00–0.07)
Basophils Absolute: 0.1 10*3/uL (ref 0.0–0.1)
Basophils Relative: 1 %
Eosinophils Absolute: 0.2 10*3/uL (ref 0.0–0.5)
Eosinophils Relative: 2 %
HCT: 34 % — ABNORMAL LOW (ref 36.0–46.0)
Hemoglobin: 11.4 g/dL — ABNORMAL LOW (ref 12.0–15.0)
Immature Granulocytes: 1 %
Lymphocytes Relative: 29 %
Lymphs Abs: 2 10*3/uL (ref 0.7–4.0)
MCH: 36.2 pg — ABNORMAL HIGH (ref 26.0–34.0)
MCHC: 33.5 g/dL (ref 30.0–36.0)
MCV: 107.9 fL — ABNORMAL HIGH (ref 80.0–100.0)
Monocytes Absolute: 0.5 10*3/uL (ref 0.1–1.0)
Monocytes Relative: 7 %
Neutro Abs: 4 10*3/uL (ref 1.7–7.7)
Neutrophils Relative %: 60 %
Platelets: 329 10*3/uL (ref 150–400)
RBC: 3.15 MIL/uL — ABNORMAL LOW (ref 3.87–5.11)
RDW: 12.7 % (ref 11.5–15.5)
WBC: 6.7 10*3/uL (ref 4.0–10.5)
nRBC: 0 % (ref 0.0–0.2)

## 2023-06-01 LAB — COMPREHENSIVE METABOLIC PANEL
ALT: 19 U/L (ref 0–44)
AST: 16 U/L (ref 15–41)
Albumin: 4 g/dL (ref 3.5–5.0)
Alkaline Phosphatase: 46 U/L (ref 38–126)
Anion gap: 9 (ref 5–15)
BUN: 18 mg/dL (ref 8–23)
CO2: 26 mmol/L (ref 22–32)
Calcium: 9.3 mg/dL (ref 8.9–10.3)
Chloride: 102 mmol/L (ref 98–111)
Creatinine, Ser: 1.04 mg/dL — ABNORMAL HIGH (ref 0.44–1.00)
GFR, Estimated: 55 mL/min — ABNORMAL LOW (ref >60)
Glucose, Bld: 135 mg/dL — ABNORMAL HIGH (ref 70–99)
Potassium: 4.8 mmol/L (ref 3.5–5.1)
Sodium: 137 mmol/L (ref 135–145)
Total Bilirubin: 0.5 mg/dL (ref 0.3–1.2)
Total Protein: 6.8 g/dL (ref 6.5–8.1)

## 2023-06-01 LAB — IRON AND TIBC
Iron: 58 ug/dL (ref 28–170)
Saturation Ratios: 11 % (ref 10.4–31.8)
TIBC: 526 ug/dL — ABNORMAL HIGH (ref 250–450)
UIBC: 468 ug/dL

## 2023-06-01 LAB — FERRITIN: Ferritin: 20 ng/mL (ref 11–307)

## 2023-06-01 LAB — LACTATE DEHYDROGENASE: LDH: 150 U/L (ref 98–192)

## 2023-06-04 NOTE — Progress Notes (Signed)
History of Present Illness: Heather Barber is a 77 y.o. year old female here for cysto following recent CT for hematuria.  1. No obstructive uropathy. No renal or ureteric calculi. No suspicious renal mass. 2. No acute inflammatory process in the abdomen or pelvis. 3. Multiple other nonacute observations, as described above.  Past Medical History:  Diagnosis Date   (HFpEF) heart failure with preserved ejection fraction ([name])    a. EF previously 30-35% in 02/2021 in the setting of afib w/RVR --> normalized by repeat imaging in 05/2021.   Anxiety    Atrial flutter ([name])    a. s/p DCCV in 02/2021   Atrial septal defect    Surgical repair in 1994   Cardiomyopathy [facility])    Chest pain    Normal coronary angiography in 1994 and 2011   Dizziness    Endometrial cancer, grade I ([name]) 10/09/2019   Essential thrombocythemia ([name]) 06/06/2016   GERD (gastroesophageal reflux disease)    History of kidney stones    Hyperlipidemia    Hypertension    LBBB (left bundle branch block) 2011   Mitral regurgitation    Palpitations    Persistent atrial fibrillation ([name])    PONV (postoperative nausea and vomiting)    Syncope    Tricuspid regurgitation    Type 2 diabetes mellitus with other specified complication ([name]) 07/18/2019   Ventricular tachycardia ([name])    Exercise induced    Past Surgical History:  Procedure Laterality Date   APPENDECTOMY     ASD REPAIR  1994   [facility]   BREAST EXCISIONAL BIOPSY     Right and left   BREAST SURGERY     right and left breast-benign   CARDIAC CATHETERIZATION  12/24/2009   Dr. Excell Seltzer    CARDIOVERSION N/A 03/04/2021   Procedure: CARDIOVERSION;  Surgeon: Antoine Poche, MD;  Location: [facility];  Service: Endoscopy;  Laterality: N/A;   CATARACT EXTRACTION W/PHACO Left 03/11/2013   Procedure: CATARACT EXTRACTION PHACO AND INTRAOCULAR LENS PLACEMENT (IOC);  Surgeon: Loraine Leriche T. Nile Riggs, MD;  Location: [facility];  Service: Ophthalmology;  Laterality:  Left;  CDE:  12.20   HYSTEROSCOPY WITH D & C N/A 11/11/2019   Procedure: DILATATION AND CURETTAGE /HYSTEROSCOPY;  Surgeon: Tilda Burrow, MD;  Location: [facility];  Service: Gynecology;  Laterality: N/A;   POLYPECTOMY N/A 11/11/2019   Procedure: POLYPECTOMY(ENDOMETRIAL POLYP);  Surgeon: Tilda Burrow, MD;  Location: [facility];  Service: Gynecology;  Laterality: N/A;   ROBOTIC ASSISTED LAPAROSCOPIC HYSTERECTOMY AND SALPINGECTOMY Bilateral 12/09/2019   Procedure: XI ROBOTIC ASSISTED LAPAROSCOPIC HYSTERECTOMY BILATERAL SALPINGOOOPHORECTOMY;  Surgeon: Adolphus Birchwood, MD;  Location: [facility];  Service: Gynecology;  Laterality: Bilateral;   SENTINEL NODE BIOPSY N/A 12/09/2019   Procedure: SENTINEL LYMPH  NODE BIOPSY;  Surgeon: Adolphus Birchwood, MD;  [facility]: [facility];  Service: Gynecology;  Laterality: N/A;   TEE WITHOUT CARDIOVERSION N/A 03/04/2021   Procedure: TRANSESOPHAGEAL ECHOCARDIOGRAM (TEE);  Surgeon: Antoine Poche, MD;  Location: [facility];  Service: Endoscopy;  Laterality: N/A;   TONSILLECTOMY     TUBAL LIGATION     Bilateral    Home Medications:  (Not in a hospital admission)   Allergies:  Allergies  Allergen Reactions   Codeine Nausea And Vomiting   Morphine Other (See Comments)    GI symptoms    Family History  Problem Relation Age of Onset   Cancer Mother        female   Heart failure Father  Stroke Sister    Cancer Sister        breast   Coronary artery disease Neg Hx     Social History:  reports that she has never smoked. She has never used smokeless tobacco. She reports that she does not drink alcohol and does not use drugs.  ROS: A complete review of systems was performed.  All systems are negative except for pertinent findings as noted.  Physical Exam:  Vital signs in last 24 hours: @VSRANGES @ General:  Alert and oriented, No acute distress HEENT: Normocephalic, atraumatic Neck: No JVD or lymphadenopathy Cardiovascular: Regular rate  Lungs: Normal  inspiratory/expiratory excursion Abdomen: Soft, nontender, nondistended, no abdominal masses Back: No CVA tenderness Extremities: No edema Neurologic: Grossly intact  I have reviewed prior pt notes  I have reviewed urinalysis results  I have independently reviewed prior imaging--CT    Cystoscopy Procedure Note:  Indication: Hematuria  After informed consent and discussion of the procedure and its risks, Heather Barber was positioned and prepped in the standard fashion.  Cystoscopy was performed with a flexible cystoscope.   Findings: Urethra: Normal Ureteral orifices: Normal bilaterally Bladder: Mild erythema right dome, smooth urothelium.  No urothelial lesions.  The patient tolerated the procedure well.     Impression/Assessment:  Persistent hematuria with negative CT with the exception of bilateral renal cysts  Mild erythema bladder, not significantly suspicious  Plan:  I will send urine for cytology today  If worrisome, proceed with cystoscopy and biopsy  Bertram Millard  06/04/2023, 12:04 PM  Bertram Millard.  MD

## 2023-06-05 ENCOUNTER — Ambulatory Visit (INDEPENDENT_AMBULATORY_CARE_PROVIDER_SITE_OTHER): Payer: Medicare Other | Admitting: Urology

## 2023-06-05 ENCOUNTER — Encounter: Payer: Self-pay | Admitting: Urology

## 2023-06-05 VITALS — BP 151/76 | HR 69

## 2023-06-05 DIAGNOSIS — N952 Postmenopausal atrophic vaginitis: Secondary | ICD-10-CM

## 2023-06-05 DIAGNOSIS — N3000 Acute cystitis without hematuria: Secondary | ICD-10-CM

## 2023-06-05 DIAGNOSIS — N3289 Other specified disorders of bladder: Secondary | ICD-10-CM | POA: Diagnosis not present

## 2023-06-05 DIAGNOSIS — R3129 Other microscopic hematuria: Secondary | ICD-10-CM | POA: Diagnosis not present

## 2023-06-05 LAB — URINALYSIS, ROUTINE W REFLEX MICROSCOPIC
Bilirubin, UA: NEGATIVE
Glucose, UA: NEGATIVE
Ketones, UA: NEGATIVE
Nitrite, UA: NEGATIVE
Specific Gravity, UA: 1.03 (ref 1.005–1.030)
Urobilinogen, Ur: 0.2 mg/dL (ref 0.2–1.0)
pH, UA: 5.5 (ref 5.0–7.5)

## 2023-06-05 LAB — MICROSCOPIC EXAMINATION
Epithelial Cells (non renal): >10 /hpf — AB (ref 0–10)
RBC, Urine: >30 /hpf — AB (ref 0–2)

## 2023-06-05 MED ORDER — CIPROFLOXACIN HCL 500 MG PO TABS
500.0000 mg | ORAL_TABLET | Freq: Once | ORAL | Status: AC
Start: 2023-06-05 — End: 2023-06-05
  Administered 2023-06-05: 500 mg via ORAL

## 2023-06-10 NOTE — Progress Notes (Unsigned)
[facility] [address], [place] [address]   CLINIC:  Medical Oncology/Hematology  PCP:  Dettinger, Elige Radon, MD [address] [place] [address] [phone]   REASON FOR VISIT:  Follow-up for JAK2 positive essential thrombocytosis   PRIOR THERAPY: None   CURRENT THERAPY: Hydrea 500 mg once daily   INTERVAL HISTORY:   Heather Barber 77 y.o. female returns for routine follow-up of her JAK2 positive essential thrombocytosis.  She was last seen by Rojelio Brenner PA-C on 01/31/2023.  At today's visit, she reports feeling fair.  No recent hospitalizations, surgeries, or changes in baseline health status.  She is tolerating Hydrea well at 500 mg daily.  She denies any GI symptoms such as nausea, vomiting, diarrhea.  She has not noted any mouth sores or skin ulcers.   Energy levels are stable with some mild intermittent fatigue.   She denies any erythromelalgia, aquagenic pruritus, Raynaud's phenomenon, or vasomotor symptoms.   No signs or symptoms of DVT or PE.  No B symptoms such as fever, chills, night sweats, unintentional weight loss.    Regarding her anemia, she denies any chest pain, dyspnea on exertion, syncopal episodes.   She has not noticed any bleeding such as epistaxis, hematemesis, hematochezia, or melena.  However, she has had some microscopic hematuria 3 months, with unremarkable workup by urology.  No pelvic pain, B symptoms, or abnormal vaginal bleeding at this time    She has 75% energy and 100% appetite. She endorses that she is maintaining a stable weight.  ASSESSMENT & PLAN:  1.  JAK2 positive essential thrombocytosis - High risk because of her advanced age - Hydroxyurea 500 mg daily, currently tolerating well - No prior history of thrombosis.  She takes Eliquis for Afib. - She does not report any aquagenic pruritus or vasomotor symptoms.    - No lymphadenopathy or splenomegaly on exam today (06/11/23) - Most recent labs (06/01/2023):  Platelets 325.  Hgb 11.4/MCV 107.9.  Normal WBC and differential.  Baseline CMP, normal LDH. - PLAN: Continue Hydrea 500 mg daily     - RTC 3 months for repeat labs and follow-up.   2.  Anemia with iron deficiency and B12 deficiency - Labs in November 2022 showed mildly decreased Hgb 11.1, MCV 120.8 (macrocytosis secondary to Hydrea). - She was also noted to have mild iron deficiency (ferritin 56, iron saturation 17%, TIBC 508) and B12 deficiency (147) - Additional labs for work-up of anemia were unremarkable (normal creatinine 0.86, normal folate, copper; SPEP and free light chains unremarkable) - She reports that she forgot to start taking iron and B12 supplements as instructed. - Most recent labs (06/01/2023): Hgb 11.4/MCV 107.9.  Ferritin 20, iron saturation 11%, elevated TIBC 526.  Vitamin B12 remains low at 142, with MMA 335. - No bright blood per rectum or melena.  Microscopic hematuria with negative workup per urology. - Last EGD/colonoscopy was "many years ago."  She had negative Cologuard in September 2022. - Some degree of anemia secondary to Hydrea permissive in the setting of cytoreductive therapy for essential thrombocytosis - PLAN: Continue same dose of Hydrea as above. - Will check stool cards x 3  - Reminded to restart ferrous sulfate 325 mg daily    - Reminded to restart B12 supplement 1000 mcg daily  - Labs in 3 months to include iron and B12 panels   3.  Stage Ia grade 1 endometrioid adenocarcinoma - Status post robotic staging on 12/09/2019. - MMR  normal. - Due to low risk of recurrence, no adjuvant therapy was recommended. - Seen by GYN in June 2023 due to isolated episode of vaginal bleeding, no concerns noted per GYN note - No pelvic pain, B symptoms, or abnormal vaginal bleeding at this time     - PLAN: Continue follow-up with GYN every 6 months for 5 years.   PLAN SUMMARY: >> Check stool cards x 3  >> Labs in 3 months at [facility] (via [facility]) = CBC/D, CMP, LDH, B12,  MMA, ferritin, iron/TIBC >> OFFICE visit in 3 months (1 week after labs)     REVIEW OF SYSTEMS:   Review of Systems  Constitutional:  Positive for fatigue (mild). Negative for appetite change, chills, diaphoresis, fever and unexpected weight change.  HENT:   Negative for lump/mass and nosebleeds.   Eyes:  Negative for eye problems.  Respiratory:  Negative for cough, hemoptysis and shortness of breath.   Cardiovascular:  Negative for chest pain, leg swelling and palpitations.  Gastrointestinal:  Negative for abdominal pain, blood in stool, constipation, diarrhea, nausea and vomiting.  Genitourinary:  Negative for dysuria and hematuria.   Skin: Negative.   Neurological:  Negative for dizziness, headaches and light-headedness.  Hematological:  Does not bruise/bleed easily.     PHYSICAL EXAM:  ECOG PERFORMANCE STATUS: 0 - Asymptomatic  Vitals:   06/11/23 1004  BP: 138/69  Pulse: 71  Resp: 16  Temp: 98.8 F (37.1 C)  SpO2: 98%   Filed Weights   06/11/23 1004  Weight: 174 lb 3.2 oz (79 kg)   Physical Exam Constitutional:      Appearance: Normal appearance. She is obese.  Cardiovascular:     Heart sounds: Normal heart sounds.  Pulmonary:     Breath sounds: Normal breath sounds.  Neurological:     General: No focal deficit present.     Mental Status: Mental status is at baseline.  Psychiatric:        Behavior: Behavior normal. Behavior is cooperative.     PAST MEDICAL/SURGICAL HISTORY:  Past Medical History:  Diagnosis Date   (HFpEF) heart failure with preserved ejection fraction ([name])    a. EF previously 30-35% in 02/2021 in the setting of afib w/RVR --> normalized by repeat imaging in 05/2021.   Anxiety    Atrial flutter ([name])    a. s/p DCCV in 02/2021   Atrial septal defect    Surgical repair in 1994   Cardiomyopathy [facility])    Chest pain    Normal coronary angiography in 1994 and 2011   Dizziness    Endometrial cancer, grade I ([name]) 10/09/2019   Essential  thrombocythemia ([name]) 06/06/2016   GERD (gastroesophageal reflux disease)    History of kidney stones    Hyperlipidemia    Hypertension    LBBB (left bundle branch block) 2011   Mitral regurgitation    Palpitations    Persistent atrial fibrillation ([name])    PONV (postoperative nausea and vomiting)    Syncope    Tricuspid regurgitation    Type 2 diabetes mellitus with other specified complication ([name]) 07/18/2019   Ventricular tachycardia ([name])    Exercise induced   Past Surgical History:  Procedure Laterality Date   APPENDECTOMY     ASD REPAIR  1994   [facility]   BREAST EXCISIONAL BIOPSY     Right and left   BREAST SURGERY     right and left breast-benign   CARDIAC CATHETERIZATION  12/24/2009   Dr. Excell Seltzer  CARDIOVERSION N/A 03/04/2021   Procedure: CARDIOVERSION;  Surgeon: Antoine Poche, MD;  Location: [facility];  Service: Endoscopy;  Laterality: N/A;   CATARACT EXTRACTION W/PHACO Left 03/11/2013   Procedure: CATARACT EXTRACTION PHACO AND INTRAOCULAR LENS PLACEMENT (IOC);  Surgeon: Loraine Leriche T. Nile Riggs, MD;  Location: [facility];  Service: Ophthalmology;  Laterality: Left;  CDE:  12.20   HYSTEROSCOPY WITH D & C N/A 11/11/2019   Procedure: DILATATION AND CURETTAGE /HYSTEROSCOPY;  Surgeon: Tilda Burrow, MD;  Location: [facility];  Service: Gynecology;  Laterality: N/A;   POLYPECTOMY N/A 11/11/2019   Procedure: POLYPECTOMY(ENDOMETRIAL POLYP);  Surgeon: Tilda Burrow, MD;  Location: [facility];  Service: Gynecology;  Laterality: N/A;   ROBOTIC ASSISTED LAPAROSCOPIC HYSTERECTOMY AND SALPINGECTOMY Bilateral 12/09/2019   Procedure: XI ROBOTIC ASSISTED LAPAROSCOPIC HYSTERECTOMY BILATERAL SALPINGOOOPHORECTOMY;  Surgeon: Adolphus Birchwood, MD;  Location: [facility];  Service: Gynecology;  Laterality: Bilateral;   SENTINEL NODE BIOPSY N/A 12/09/2019   Procedure: SENTINEL LYMPH  NODE BIOPSY;  Surgeon: Adolphus Birchwood, MD;  Location: [facility];  Service: Gynecology;  Laterality: N/A;   TEE WITHOUT CARDIOVERSION  N/A 03/04/2021   Procedure: TRANSESOPHAGEAL ECHOCARDIOGRAM (TEE);  Surgeon: Antoine Poche, MD;  Location: [facility];  Service: Endoscopy;  Laterality: N/A;   TONSILLECTOMY     TUBAL LIGATION     Bilateral    SOCIAL HISTORY:  Social History   Socioeconomic History   Marital status: Widowed    Spouse name: widowed   Number of children: 3   Years of education: Not on file   Highest education level: Not on file  Occupational History   Occupation: Production designer, theatre/television/film shop  Tobacco Use   Smoking status: Never   Smokeless tobacco: Never  Vaping Use   Vaping status: Never Used  Substance and Sexual Activity   Alcohol use: No   Drug use: No   Sexual activity: Not Currently    Birth control/protection: Post-menopausal, Surgical    Comment: hyst  Other Topics Concern   Not on file  Social History Narrative   Married and resides in [place]. 3 children. 3 grandchildren.   Sedentary lifestyle   Social Determinants of Health   Financial Resource Strain: Low Risk  (03/29/2023)   Overall Financial Resource Strain (CARDIA)    Difficulty of Paying Living Expenses: Not hard at all  Food Insecurity: No Food Insecurity (03/29/2023)   Hunger Vital Sign    Worried About Running Out of Food in the Last Year: Never true    Ran Out of Food in the Last Year: Never true  Transportation Needs: No Transportation Needs (03/29/2023)   PRAPARE - Administrator, Civil Service (Medical): No    Lack of Transportation (Non-Medical): No  Physical Activity: Insufficiently Active (03/29/2023)   Exercise Vital Sign    Days of Exercise per Week: 1 day    Minutes of Exercise per Session: 10 min  Stress: No Stress Concern Present (03/29/2023)   [employer] of Occupational Health - Occupational Stress Questionnaire    Feeling of Stress : Not at all  Social Connections: Moderately Isolated (03/29/2023)   Social Connection and Isolation Panel [NHANES]    Frequency of Communication with Friends and  Family: Three times a week    Frequency of Social Gatherings with Friends and Family: Once a week    Attends Religious Services: More than 4 times per year    Active Member of [employer] or Organizations: No    Attends Banker Meetings: Never  Marital Status: Widowed  Intimate Partner Violence: Not At Risk (03/29/2023)   Humiliation, Afraid, Rape, and Kick questionnaire    Fear of Current or Ex-Partner: No    Emotionally Abused: No    Physically Abused: No    Sexually Abused: No    FAMILY HISTORY:  Family History  Problem Relation Age of Onset   Cancer Mother        female   Heart failure Father    Stroke Sister    Cancer Sister        breast   Coronary artery disease Neg Hx     CURRENT MEDICATIONS:  Outpatient Encounter Medications as of 06/11/2023  Medication Sig   Accu-Chek Softclix Lancets lancets 1 each by Other route daily at 12 noon. Use as instructed   amiodarone (PACERONE) 200 MG tablet TAKE 1/2 TABLET BY MOUTH DAILY   apixaban (ELIQUIS) 5 MG TABS tablet TAKE 1 TABLET BY MOUTH TWICE DAILY   atorvastatin (LIPITOR) 40 MG tablet Take 1 tablet (40 mg total) by mouth at bedtime.   cyanocobalamin (VITAMIN B12) 1000 MCG tablet Take 1 tablet (1,000 mcg total) by mouth daily.   estradiol (ESTRACE) 0.1 MG/GM vaginal cream 1/2 inch in applicator per vagina 2 nights a week   fenofibrate 160 MG tablet TAKE 1 TABLET BY MOUTH EVERY DAY   ferrous sulfate 325 (65 FE) MG EC tablet Take 1 tablet (325 mg total) by mouth daily with breakfast.   glucose blood (ACCU-CHEK GUIDE) test strip 1 each by Other route daily. Use as instructed   hydroxyurea (HYDREA) 500 MG capsule TAKE 1 CAPSULE BY MOUTH TWICE A DAY   ketoconazole (NIZORAL) 2 % cream Apply 1 application topically 2 (two) times daily.   levothyroxine (SYNTHROID) 75 MCG tablet Take 1 tablet (75 mcg total) by mouth daily before breakfast.   loratadine (CLARITIN) 10 MG tablet Take by mouth.   metFORMIN (GLUCOPHAGE) 500 MG  tablet Take 1 tablet (500 mg total) by mouth 2 (two) times daily with a meal.   methenamine (HIPREX) 1 g tablet Take 1 tablet (1 g total) by mouth 2 (two) times daily with a meal.   metoprolol succinate (TOPROL-XL) 100 MG 24 hr tablet TAKE 1 TABLET BY MOUTH TWICE A DAY WITH FOOD   Multiple Vitamins-Minerals (MULTIVITAMIN WOMEN 50+) TABS Take by mouth daily.   naproxen sodium (ALEVE) 220 MG tablet Take 220 mg by mouth as needed.   omeprazole (PRILOSEC) 20 MG capsule Take 1 capsule (20 mg total) by mouth daily.   sacubitril-valsartan (ENTRESTO) 24-26 MG TAKE 1 TABLET BY MOUTH TWICE A DAY   [DISCONTINUED] apixaban (ELIQUIS) 5 MG TABS tablet Take 1 tablet (5 mg total) by mouth 2 (two) times daily.   [DISCONTINUED] apixaban (ELIQUIS) 5 MG TABS tablet Take 1 tablet (5 mg total) by mouth 2 (two) times daily.   No facility-administered encounter medications on file as of 06/11/2023.    ALLERGIES:  Allergies  Allergen Reactions   Codeine Nausea And Vomiting   Morphine Other (See Comments)    GI symptoms    LABORATORY DATA:  I have reviewed the labs as listed.  CBC    Component Value Date/Time   WBC 6.7 06/01/2023 1222   RBC 3.15 (L) 06/01/2023 1222   HGB 11.4 (L) 06/01/2023 1222   HGB 11.2 04/13/2023 1540   HCT 34.0 (L) 06/01/2023 1222   HCT 32.0 (L) 04/13/2023 1540   PLT 329 06/01/2023 1222   PLT 359 04/13/2023 1540  MCV 107.9 (H) 06/01/2023 1222   MCV 101 (H) 04/13/2023 1540   MCH 36.2 (H) 06/01/2023 1222   MCHC 33.5 06/01/2023 1222   RDW 12.7 06/01/2023 1222   RDW 12.2 04/13/2023 1540   LYMPHSABS 2.0 06/01/2023 1222   LYMPHSABS 2.1 04/13/2023 1540   MONOABS 0.5 06/01/2023 1222   EOSABS 0.2 06/01/2023 1222   EOSABS 0.1 04/13/2023 1540   BASOSABS 0.1 06/01/2023 1222   BASOSABS 0.0 04/13/2023 1540      Latest Ref Rng & Units 06/01/2023   12:22 PM 04/24/2023    4:28 PM 01/03/2023   12:54 PM  CMP  Glucose 70 - 99 mg/dL 604  540  981   BUN 8 - 23 mg/dL 18  17  23    Creatinine  0.44 - 1.00 mg/dL 1.91  4.78  2.95   Sodium 135 - 145 mmol/L 137  140  137   Potassium 3.5 - 5.1 mmol/L 4.8  5.0  4.5   Chloride 98 - 111 mmol/L 102  102  103   CO2 22 - 32 mmol/L 26  23  24    Calcium 8.9 - 10.3 mg/dL 9.3  62.1  9.2   Total Protein 6.5 - 8.1 g/dL 6.8   7.1   Total Bilirubin 0.3 - 1.2 mg/dL 0.5   0.6   Alkaline Phos 38 - 126 U/L 46   48   AST 15 - 41 U/L 16   18   ALT 0 - 44 U/L 19   14     DIAGNOSTIC IMAGING:  I have independently reviewed the relevant imaging and discussed with the patient.   WRAP UP:  All questions were answered. The patient knows to call the clinic with any problems, questions or concerns.  Medical decision making: Moderate  Time spent on visit: I spent 20 minutes counseling the patient face to face. The total time spent in the appointment was 30 minutes and more than 50% was on counseling.  Carnella Guadalajara, PA-C  06/11/23 12:51 PM

## 2023-06-11 ENCOUNTER — Inpatient Hospital Stay: Payer: Medicare Other | Admitting: Physician Assistant

## 2023-06-11 VITALS — BP 138/69 | HR 71 | Temp 98.8°F | Resp 16 | Wt 174.2 lb

## 2023-06-11 DIAGNOSIS — Z1589 Genetic susceptibility to other disease: Secondary | ICD-10-CM

## 2023-06-11 DIAGNOSIS — E611 Iron deficiency: Secondary | ICD-10-CM

## 2023-06-11 DIAGNOSIS — D473 Essential (hemorrhagic) thrombocythemia: Secondary | ICD-10-CM | POA: Diagnosis not present

## 2023-06-11 DIAGNOSIS — E538 Deficiency of other specified B group vitamins: Secondary | ICD-10-CM

## 2023-06-11 MED ORDER — FERROUS SULFATE 325 (65 FE) MG PO TBEC
325.0000 mg | DELAYED_RELEASE_TABLET | Freq: Every day | ORAL | 3 refills | Status: AC
Start: 2023-06-11 — End: ?

## 2023-06-11 MED ORDER — VITAMIN B-12 1000 MCG PO TABS
1000.0000 ug | ORAL_TABLET | Freq: Every day | ORAL | 3 refills | Status: DC
Start: 2023-06-11 — End: 2023-11-01

## 2023-06-11 NOTE — Patient Instructions (Signed)
[facility] Discharge Instructions  You were seen today by Rojelio Brenner PA-C for your elevated platelets and mild anemia.  Your platelet levels look great!  Continue hydroxyurea (Hydrea) to 500 mg (1 tablet) once per day.  We will recheck your levels again in 3 months.  LABS: Return in 3 months for repeat labs  MEDICATIONS: - Continue Hydrea to 500 mg (1 tablet) once daily - START taking ferrous sulfate (iron supplement) 325 mg daily.   - START taking vitamin B12 (cyanocobalamin) 1,000 mcg (1 mg) daily.  FOLLOW-UP APPOINTMENT: Office visit in 3 months with labs the week before   Thank you for choosing [facility] at [facility] to provide your oncology and hematology care.  To afford each patient quality time with our provider, please arrive at least 15 minutes before your scheduled appointment time.   If you have a lab appointment with the Cancer Center please come in thru the Main Entrance and check in at the main information desk.  You need to re-schedule your appointment should you arrive 10 or more minutes late.  We strive to give you quality time with our providers, and arriving late affects you and other patients whose appointments are after yours.  Also, if you no show three or more times for appointments you may be dismissed from the clinic at the providers discretion.     Again, thank you for choosing [facility].  Our hope is that these requests will decrease the amount of time that you wait before being seen by our physicians.       _____________________________________________________________  Should you have questions after your visit to [facility], please contact our office at [phone] and follow the prompts.  Our office hours are 8:00 a.m. and 4:30 p.m. Monday - Friday.  Please note that voicemails left after 4:00 p.m. may not be returned until the following business day.  We are  closed weekends and major holidays.  You do have access to a nurse 24-7, just call the main number to the clinic [phone] and do not press any options, hold on the line and a nurse will answer the phone.    For prescription refill requests, have your pharmacy contact our office and allow 72 hours.    Due to Covid, you will need to wear a mask upon entering the hospital. If you do not have a mask, a mask will be given to you at the Main Entrance upon arrival. For doctor visits, patients may have 1 support person age 20 or older with them. For treatment visits, patients can not have anyone with them due to social distancing guidelines and our immunocompromised population.

## 2023-06-26 ENCOUNTER — Ambulatory Visit (HOSPITAL_COMMUNITY)
Admission: RE | Admit: 2023-06-26 | Discharge: 2023-06-26 | Disposition: A | Discharge status: Home / Self Care | Payer: Medicare Other | Source: Ambulatory Visit | Attending: Medical | Admitting: Medical

## 2023-06-26 DIAGNOSIS — I340 Nonrheumatic mitral (valve) insufficiency: Secondary | ICD-10-CM | POA: Insufficient documentation

## 2023-06-26 LAB — ECHOCARDIOGRAM COMPLETE
AR max vel: 1.5 cm2
AV Area VTI: 1.6 cm2
AV Area mean vel: 1.58 cm2
AV Mean grad: 7.5 mmHg
AV Peak grad: 14.2 mmHg
Ao pk vel: 1.88 m/s
Area-P 1/2: 2.5 cm2
S' Lateral: 3.2 cm

## 2023-06-26 NOTE — Progress Notes (Signed)
*  PRELIMINARY RESULTS* Echocardiogram 2D Echocardiogram has been performed.  Stacey Drain 06/26/2023, 4:39 PM

## 2023-07-03 ENCOUNTER — Other Ambulatory Visit: Payer: Self-pay | Admitting: Cardiology

## 2023-07-08 ENCOUNTER — Other Ambulatory Visit: Payer: Self-pay | Admitting: Family Medicine

## 2023-07-19 ENCOUNTER — Ambulatory Visit (INDEPENDENT_AMBULATORY_CARE_PROVIDER_SITE_OTHER): Payer: Medicare Other | Admitting: Family Medicine

## 2023-07-19 ENCOUNTER — Encounter: Payer: Self-pay | Admitting: Family Medicine

## 2023-07-19 ENCOUNTER — Other Ambulatory Visit (INDEPENDENT_AMBULATORY_CARE_PROVIDER_SITE_OTHER): Payer: Medicare Other

## 2023-07-19 VITALS — BP 126/65 | HR 70 | Temp 97.6°F | Ht 64.0 in | Wt 172.0 lb

## 2023-07-19 DIAGNOSIS — E1169 Type 2 diabetes mellitus with other specified complication: Secondary | ICD-10-CM | POA: Diagnosis not present

## 2023-07-19 DIAGNOSIS — Z23 Encounter for immunization: Secondary | ICD-10-CM

## 2023-07-19 DIAGNOSIS — E782 Mixed hyperlipidemia: Secondary | ICD-10-CM

## 2023-07-19 DIAGNOSIS — E039 Hypothyroidism, unspecified: Secondary | ICD-10-CM

## 2023-07-19 DIAGNOSIS — D473 Essential (hemorrhagic) thrombocythemia: Secondary | ICD-10-CM

## 2023-07-19 DIAGNOSIS — Z780 Asymptomatic menopausal state: Secondary | ICD-10-CM

## 2023-07-19 DIAGNOSIS — I10 Essential (primary) hypertension: Secondary | ICD-10-CM | POA: Diagnosis not present

## 2023-07-19 DIAGNOSIS — I4891 Unspecified atrial fibrillation: Secondary | ICD-10-CM

## 2023-07-19 DIAGNOSIS — E1159 Type 2 diabetes mellitus with other circulatory complications: Secondary | ICD-10-CM

## 2023-07-19 DIAGNOSIS — E781 Pure hyperglyceridemia: Secondary | ICD-10-CM

## 2023-07-19 LAB — BAYER DCA HB A1C WAIVED: HB A1C (BAYER DCA - WAIVED): 6.8 % — ABNORMAL HIGH (ref 4.8–5.6)

## 2023-07-19 MED ORDER — LEVOTHYROXINE SODIUM 75 MCG PO TABS: 75.0000 ug | ORAL_TABLET | Freq: Every day | ORAL | 3 refills | Status: DC

## 2023-07-19 MED ORDER — HYDROXYUREA 500 MG PO CAPS
500.0000 mg | ORAL_CAPSULE | Freq: Two times a day (BID) | ORAL | 3 refills | Status: DC
Start: 2023-07-19 — End: 2024-01-23

## 2023-07-19 MED ORDER — ATORVASTATIN CALCIUM 40 MG PO TABS: 40.0000 mg | ORAL_TABLET | Freq: Every day | ORAL | 3 refills | Status: DC

## 2023-07-19 NOTE — Progress Notes (Signed)
BP 126/65   Pulse 70   Temp 97.6 F (36.4 C) (Temporal)   Ht 5\' 4"  (1.626 m)   Wt 172 lb (78 kg)   SpO2 96%   BMI 29.52 kg/m    Subjective:   [name], female    DOB: January 09, 1946, 77 y.o.   MRN: [id number]  HPI: Heather Barber is a 77 y.o. female presenting on 07/19/2023 for Medical Management of Chronic Issues (3 month)   HPI Type 2 diabetes mellitus Patient comes in today for recheck of his diabetes. Patient has been currently taking metformin. Patient is not currently on an ACE inhibitor/ARB. Patient has not seen an ophthalmologist this year. Patient denies any new issues with their feet. The symptom started onset as an adult hyperlipidemia and hypertriglyceridemia and A-fib and hypertension and hypothyroidism ARE RELATED TO DM   Hyperlipidemia and hypertriglyceridemia Patient is coming in for recheck of his hyperlipidemia. The patient is currently taking fenofibrate and atorvastatin. They deny any issues with myalgias or history of liver damage from it. They deny any focal numbness or weakness or chest pain.   Hypertension and A-fib Patient is currently on amiodarone and Eliquis and Entresto and metoprolol, and their blood pressure today is 126/65. Patient denies any lightheadedness or dizziness. Patient denies headaches, blurred vision, chest pains, shortness of breath, or weakness. Denies any side effects from medication and is content with current medication.   Hypothyroidism recheck Patient is coming in for thyroid recheck today as well. They deny any issues with hair changes or heat or cold problems or diarrhea or constipation. They deny any chest pain or palpitations. They are currently on levothyroxine 75 micrograms   Relevant past medical, surgical, family and social history reviewed and updated as indicated. Interim medical history since our last visit reviewed. Allergies and medications reviewed and updated.  Review of Systems  Constitutional:   Negative for chills and fever.  Eyes:  Negative for redness and visual disturbance.  Respiratory:  Negative for chest tightness and shortness of breath.   Cardiovascular:  Negative for chest pain and leg swelling.  Musculoskeletal:  Negative for back pain and gait problem.  Skin:  Negative for rash.  Neurological:  Negative for light-headedness and headaches.  Psychiatric/Behavioral:  Negative for agitation and behavioral problems.   All other systems reviewed and are negative.   Per HPI unless specifically indicated above   Allergies as of 07/19/2023       Reactions   Codeine Nausea And Vomiting   Morphine Other (See Comments)   GI symptoms        Medication List        Accurate as of July 19, 2023  4:02 PM. If you have any questions, ask your nurse or doctor.          Accu-Chek Guide test strip Generic drug: glucose blood 1 each by Other route daily. Use as instructed   Accu-Chek Softclix Lancets lancets 1 each by Other route daily at 12 noon. Use as instructed   amiodarone 200 MG tablet Commonly known as: PACERONE TAKE 1/2 TABLET BY MOUTH DAILY   atorvastatin 40 MG tablet Commonly known as: LIPITOR Take 1 tablet (40 mg total) by mouth at bedtime. What changed: See the new instructions. Changed by: Elige Radon Joseangel Nettleton   cyanocobalamin 1000 MCG tablet Commonly known as: VITAMIN B12 Take 1 tablet (1,000 mcg total) by mouth daily.   Eliquis 5 MG Tabs tablet Generic drug: apixaban TAKE 1  TABLET BY MOUTH TWICE DAILY   Entresto 24-26 MG Generic drug: sacubitril-valsartan TAKE 1 TABLET BY MOUTH TWICE A DAY   estradiol 0.1 MG/GM vaginal cream Commonly known as: ESTRACE 1/2 inch in applicator per vagina 2 nights a week   fenofibrate 160 MG tablet TAKE 1 TABLET BY MOUTH EVERY DAY   ferrous sulfate 325 (65 FE) MG EC tablet Take 1 tablet (325 mg total) by mouth daily with breakfast.   hydroxyurea 500 MG capsule Commonly known as: HYDREA Take 1  capsule (500 mg total) by mouth 2 (two) times daily.   ketoconazole 2 % cream Commonly known as: NIZORAL Apply 1 application topically 2 (two) times daily.   levothyroxine 75 MCG tablet Commonly known as: SYNTHROID Take 1 tablet (75 mcg total) by mouth daily before breakfast.   loratadine 10 MG tablet Commonly known as: CLARITIN Take by mouth.   metFORMIN 500 MG tablet Commonly known as: GLUCOPHAGE Take 1 tablet (500 mg total) by mouth 2 (two) times daily with a meal.   methenamine 1 g tablet Commonly known as: HIPREX Take 1 tablet (1 g total) by mouth 2 (two) times daily with a meal.   metoprolol succinate 100 MG 24 hr tablet Commonly known as: TOPROL-XL TAKE 1 TABLET BY MOUTH TWICE A DAY WITH FOOD   Multivitamin Women 50+ Tabs Take by mouth daily.   naproxen sodium 220 MG tablet Commonly known as: ALEVE Take 220 mg by mouth as needed.   omeprazole 20 MG capsule Commonly known as: PRILOSEC Take 1 capsule (20 mg total) by mouth daily.         Objective:   BP 126/65   Pulse 70   Temp 97.6 F (36.4 C) (Temporal)   Ht 5\' 4"  (1.626 m)   Wt 172 lb (78 kg)   SpO2 96%   BMI 29.52 kg/m   Wt Readings from Last 3 Encounters:  07/19/23 172 lb (78 kg)  06/11/23 174 lb 3.2 oz (79 kg)  04/13/23 171 lb (77.6 kg)    Physical Exam Vitals and nursing note reviewed.  Constitutional:      General: She is not in acute distress.    Appearance: She is well-developed. She is not diaphoretic.  Eyes:     Conjunctiva/sclera: Conjunctivae normal.  Cardiovascular:     Rate and Rhythm: Normal rate and regular rhythm.     Heart sounds: Normal heart sounds. No murmur heard. Pulmonary:     Effort: Pulmonary effort is normal. No respiratory distress.     Breath sounds: Normal breath sounds. No wheezing.  Musculoskeletal:        General: No swelling or tenderness. Normal range of motion.  Skin:    General: Skin is warm and dry.     Findings: No rash.  Neurological:      Mental Status: She is alert and oriented to person, place, and time.     Coordination: Coordination normal.  Psychiatric:        Behavior: Behavior normal.       Assessment & Plan:   Problem List Items Addressed This Visit       Cardiovascular and Mediastinum   Hypertension   Relevant Medications   atorvastatin (LIPITOR) 40 MG tablet   Other Relevant Orders   CBC with Differential/Platelet   CMP14+EGFR   TSH   A-fib ([name])   Relevant Medications   atorvastatin (LIPITOR) 40 MG tablet     Endocrine   Type 2 diabetes mellitus with other specified  complication ([name]) - Primary   Relevant Medications   atorvastatin (LIPITOR) 40 MG tablet   Other Relevant Orders   Bayer DCA Hb A1c Waived   CBC with Differential/Platelet   CMP14+EGFR   TSH   Hypothyroidism   Relevant Medications   levothyroxine (SYNTHROID) 75 MCG tablet   Other Relevant Orders   TSH     Other   Hyperlipidemia   Relevant Medications   atorvastatin (LIPITOR) 40 MG tablet   Other Relevant Orders   CBC with Differential/Platelet   CMP14+EGFR   TSH   Hypertriglyceridemia   Relevant Medications   atorvastatin (LIPITOR) 40 MG tablet   Other Visit Diagnoses     Essential (hemorrhagic) thrombocythemia ([name])       Relevant Medications   hydroxyurea (HYDREA) 500 MG capsule   Postmenopausal       Relevant Orders   DG [facility] DEXA     Will recheck blood counts but they are keep an eye on it.  A1c today is 6.8 which looks good.  Blood pressure and everything else looks good.  No changes today.  She will do her bone density and we today.  Follow up plan: Return in about 3 months (around 10/18/2023), or if symptoms worsen or fail to improve, for Diabetes recheck.  Counseling provided for all of the vaccine components Orders Placed This Encounter  Procedures   DG [facility] DEXA   Bayer DCA Hb A1c Waived   CBC with Differential/Platelet   CMP14+EGFR   TSH    Arville Care, MD [facility] 07/19/2023, 4:02 PM

## 2023-07-20 LAB — CMP14+EGFR
ALT: 13 IU/L (ref 0–32)
AST: 13 IU/L (ref 0–40)
Albumin: 4.5 g/dL (ref 3.8–4.8)
Alkaline Phosphatase: 51 IU/L (ref 44–121)
BUN/Creatinine Ratio: 15 (ref 12–28)
BUN: 17 mg/dL (ref 8–27)
Bilirubin Total: 0.2 mg/dL (ref 0.0–1.2)
CO2: 22 mmol/L (ref 20–29)
Calcium: 10.2 mg/dL (ref 8.7–10.3)
Chloride: 103 mmol/L (ref 96–106)
Creatinine, Ser: 1.11 mg/dL — ABNORMAL HIGH (ref 0.57–1.00)
Globulin, Total: 1.9 g/dL (ref 1.5–4.5)
Glucose: 116 mg/dL — ABNORMAL HIGH (ref 70–99)
Potassium: 4.7 mmol/L (ref 3.5–5.2)
Sodium: 143 mmol/L (ref 134–144)
Total Protein: 6.4 g/dL (ref 6.0–8.5)
eGFR: 51 mL/min/{1.73_m2} — ABNORMAL LOW (ref >59)

## 2023-07-20 LAB — CBC WITH DIFFERENTIAL/PLATELET
Basophils Absolute: 0 10*3/uL (ref 0.0–0.2)
Basos: 1 %
EOS (ABSOLUTE): 0.1 10*3/uL (ref 0.0–0.4)
Eos: 2 %
Hematocrit: 34.7 % (ref 34.0–46.6)
Hemoglobin: 11.9 g/dL (ref 11.1–15.9)
Immature Grans (Abs): 0 10*3/uL (ref 0.0–0.1)
Immature Granulocytes: 1 %
Lymphocytes Absolute: 2 10*3/uL (ref 0.7–3.1)
Lymphs: 34 %
MCH: 35.6 pg — ABNORMAL HIGH (ref 26.6–33.0)
MCHC: 34.3 g/dL (ref 31.5–35.7)
MCV: 104 fL — ABNORMAL HIGH (ref 79–97)
Monocytes Absolute: 0.6 10*3/uL (ref 0.1–0.9)
Monocytes: 10 %
Neutrophils Absolute: 3.2 10*3/uL (ref 1.4–7.0)
Neutrophils: 52 %
Platelets: 361 10*3/uL (ref 150–450)
RBC: 3.34 x10E6/uL — ABNORMAL LOW (ref 3.77–5.28)
RDW: 12.3 % (ref 11.7–15.4)
WBC: 5.9 10*3/uL (ref 3.4–10.8)

## 2023-07-20 LAB — TSH: TSH: 1.8 u[IU]/mL (ref 0.450–4.500)

## 2023-08-01 ENCOUNTER — Encounter: Payer: Self-pay | Admitting: Cardiology

## 2023-08-01 ENCOUNTER — Ambulatory Visit: Payer: Medicare Other | Attending: Cardiology | Admitting: Cardiology

## 2023-08-01 VITALS — BP 136/62 | HR 62 | Ht 63.0 in | Wt 172.0 lb

## 2023-08-01 DIAGNOSIS — I5032 Chronic diastolic (congestive) heart failure: Secondary | ICD-10-CM | POA: Diagnosis present

## 2023-08-01 DIAGNOSIS — I340 Nonrheumatic mitral (valve) insufficiency: Secondary | ICD-10-CM | POA: Insufficient documentation

## 2023-08-01 DIAGNOSIS — E782 Mixed hyperlipidemia: Secondary | ICD-10-CM | POA: Insufficient documentation

## 2023-08-01 DIAGNOSIS — I480 Paroxysmal atrial fibrillation: Secondary | ICD-10-CM | POA: Insufficient documentation

## 2023-08-01 MED ORDER — METOPROLOL SUCCINATE ER 50 MG PO TB24: 75.0000 mg | ORAL_TABLET | Freq: Every day | ORAL | 3 refills | Status: DC

## 2023-08-01 MED ORDER — FUROSEMIDE 20 MG PO TABS: 20.0000 mg | ORAL_TABLET | Freq: Every day | ORAL | 3 refills | Status: DC | PRN

## 2023-08-01 NOTE — Progress Notes (Signed)
Clinical Summary Heather Barber is a 77 y.o.female seen today for follow up of the following medical problems.      1.Afib vs atypical aflutter with variable conduction - new diagnosis during 02/2021 admission - difficult to rate control, had TEE/DCCV initially but quickly had recurrent arrhythmia - started on amio, converted to SR -  TSH was elevated to 28 in 11/2021 which led to dose reduction of Amiodarone to 100 mg daily. She was also started on Synthroid by her PCP    EKG today shows SR with Wenchebach block, IVCD - some dizziness with walking, some irregular beats. Started just a few days ago - eliquis costing $150 a month. Turned down for assistance.     2. HFimpEF - new diagnosis during 02/2021 in setting of afib with RVR - 02/2021 echo LVEF 30-35% -likely tachy mediated CM   05/2021 echo: LVEF 55-60% 05/2023 LVEF 60-65%, grade I dd, mild MR, mod TR - - some LE edemea  that has increased. No specific SOb/DOE      3. Mitral regurgitation - 05/2021 mod MR, mod TR 05/2023 LVEF 60-65%, grade I dd, mild MR, mod TR     4. Hyopothyroidism - issues developed after starting amiodarone 06/2022 TSH 21, on replacement - upcoming appt with pcp for recheck   - most recent TSH 1.8  5. 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    Past Medical History:  Diagnosis Date   (HFpEF) heart failure with preserved ejection fraction ([name])    a. EF previously 30-35% in 02/2021 in the setting of afib w/RVR --> normalized by repeat imaging in 05/2021.   Anxiety    Atrial flutter ([name])    a. s/p DCCV in 02/2021   Atrial septal defect    Surgical repair in 1994   Cardiomyopathy [facility])    Chest pain    Normal coronary angiography in 1994 and 2011   Dizziness    Endometrial cancer, grade I ([name]) 10/09/2019   Essential thrombocythemia ([name]) 06/06/2016   GERD (gastroesophageal reflux disease)    History of kidney stones     Hyperlipidemia    Hypertension    LBBB (left bundle Cindy Brindisi block) 2011   Mitral regurgitation    Palpitations    Persistent atrial fibrillation ([name])    PONV (postoperative nausea and vomiting)    Syncope    Tricuspid regurgitation    Type 2 diabetes mellitus with other specified complication ([name]) 07/18/2019   Ventricular tachycardia ([name])    Exercise induced     Allergies  Allergen Reactions   Codeine Nausea And Vomiting   Morphine Other (See Comments)    GI symptoms     Current Outpatient Medications  Medication Sig Dispense Refill   Accu-Chek Softclix Lancets lancets 1 each by Other route daily at 12 noon. Use as instructed 90 each 3   amiodarone (PACERONE) 200 MG tablet TAKE 1/2 TABLET BY MOUTH DAILY 45 tablet 3   apixaban (ELIQUIS) 5 MG TABS tablet TAKE 1 TABLET BY MOUTH TWICE DAILY 60 tablet 5   atorvastatin (LIPITOR) 40 MG tablet Take 1 tablet (40 mg total) by mouth at bedtime. 90 tablet 3   cyanocobalamin (VITAMIN B12) 1000 MCG tablet Take 1 tablet (1,000 mcg total) by mouth daily. 100 tablet 3   ENTRESTO 24-26 MG TAKE 1 TABLET BY MOUTH TWICE A DAY 180 tablet 2  estradiol (ESTRACE) 0.1 MG/GM vaginal cream 1/2 inch in applicator per vagina 2 nights a week 42.5 g 3   fenofibrate 160 MG tablet TAKE 1 TABLET BY MOUTH EVERY DAY 90 tablet 1   ferrous sulfate 325 (65 FE) MG EC tablet Take 1 tablet (325 mg total) by mouth daily with breakfast. 100 tablet 3   glucose blood (ACCU-CHEK GUIDE) test strip 1 each by Other route daily. Use as instructed 90 each 3   hydroxyurea (HYDREA) 500 MG capsule Take 1 capsule (500 mg total) by mouth 2 (two) times daily. 180 capsule 3   ketoconazole (NIZORAL) 2 % cream Apply 1 application topically 2 (two) times daily. 60 g 0   levothyroxine (SYNTHROID) 75 MCG tablet Take 1 tablet (75 mcg total) by mouth daily before breakfast. 90 tablet 3   loratadine (CLARITIN) 10 MG tablet Take by mouth.     metFORMIN (GLUCOPHAGE) 500 MG tablet Take 1 tablet  (500 mg total) by mouth 2 (two) times daily with a meal. 180 tablet 3   methenamine (HIPREX) 1 g tablet Take 1 tablet (1 g total) by mouth 2 (two) times daily with a meal. 60 tablet 11   metoprolol succinate (TOPROL-XL) 100 MG 24 hr tablet TAKE 1 TABLET BY MOUTH TWICE A DAY WITH FOOD 180 tablet 1   Multiple Vitamins-Minerals (MULTIVITAMIN WOMEN 50+) TABS Take by mouth daily.     naproxen sodium (ALEVE) 220 MG tablet Take 220 mg by mouth as needed.     omeprazole (PRILOSEC) 20 MG capsule Take 1 capsule (20 mg total) by mouth daily. 90 capsule 3   No current facility-administered medications for this visit.     Past Surgical History:  Procedure Laterality Date   APPENDECTOMY     ASD REPAIR  1994   [facility]   BREAST EXCISIONAL BIOPSY     Right and left   BREAST SURGERY     right and left breast-benign   CARDIAC CATHETERIZATION  12/24/2009   Dr. Excell Seltzer    CARDIOVERSION N/A 03/04/2021   Procedure: CARDIOVERSION;  Surgeon: Antoine Poche, MD;  Location: [facility];  Service: Endoscopy;  Laterality: N/A;   CATARACT EXTRACTION W/PHACO Left 03/11/2013   Procedure: CATARACT EXTRACTION PHACO AND INTRAOCULAR LENS PLACEMENT (IOC);  Surgeon: Loraine Leriche T. Nile Riggs, MD;  Location: [facility];  Service: Ophthalmology;  Laterality: Left;  CDE:  12.20   HYSTEROSCOPY WITH D & C N/A 11/11/2019   Procedure: DILATATION AND CURETTAGE /HYSTEROSCOPY;  Surgeon: Tilda Burrow, MD;  Location: [facility];  Service: Gynecology;  Laterality: N/A;   POLYPECTOMY N/A 11/11/2019   Procedure: POLYPECTOMY(ENDOMETRIAL POLYP);  Surgeon: Tilda Burrow, MD;  Location: [facility];  Service: Gynecology;  Laterality: N/A;   ROBOTIC ASSISTED LAPAROSCOPIC HYSTERECTOMY AND SALPINGECTOMY Bilateral 12/09/2019   Procedure: XI ROBOTIC ASSISTED LAPAROSCOPIC HYSTERECTOMY BILATERAL SALPINGOOOPHORECTOMY;  Surgeon: Adolphus Birchwood, MD;  Location: [facility];  Service: Gynecology;  Laterality: Bilateral;   SENTINEL NODE BIOPSY N/A 12/09/2019   Procedure:  SENTINEL LYMPH  NODE BIOPSY;  Surgeon: Adolphus Birchwood, MD;  Location: [facility];  Service: Gynecology;  Laterality: N/A;   TEE WITHOUT CARDIOVERSION N/A 03/04/2021   Procedure: TRANSESOPHAGEAL ECHOCARDIOGRAM (TEE);  Surgeon: Antoine Poche, MD;  Location: [facility];  Service: Endoscopy;  Laterality: N/A;   TONSILLECTOMY     TUBAL LIGATION     Bilateral     Allergies  Allergen Reactions   Codeine Nausea And Vomiting   Morphine Other (See Comments)  GI symptoms      Family History  Problem Relation Age of Onset   Cancer Mother        female   Heart failure Father    Stroke Sister    Cancer Sister        breast   Coronary artery disease Neg Hx      Social History Ms. Biscardi reports that she has never smoked. She has never used smokeless tobacco. Ms. Bultman reports no history of alcohol use.   Review of Systems CONSTITUTIONAL: No weight loss, fever, chills, weakness or fatigue.  HEENT: Eyes: No visual loss, blurred vision, double vision or yellow sclerae.No hearing loss, sneezing, congestion, runny nose or sore throat.  SKIN: No rash or itching.  CARDIOVASCULAR: per hpi RESPIRATORY: No shortness of breath, cough or sputum.  GASTROINTESTINAL: No anorexia, nausea, vomiting or diarrhea. No abdominal pain or blood.  GENITOURINARY: No burning on urination, no polyuria NEUROLOGICAL: No headache, dizziness, syncope, paralysis, ataxia, numbness or tingling in the extremities. No change in bowel or bladder control.  MUSCULOSKELETAL: No muscle, back pain, joint pain or stiffness.  LYMPHATICS: No enlarged nodes. No history of splenectomy.  PSYCHIATRIC: No history of depression or anxiety.  ENDOCRINOLOGIC: No reports of sweating, cold or heat intolerance. No polyuria or polydipsia.  Marland Kitchen   Physical Examination Today's Vitals   08/01/23 1558  BP: 136/62  Pulse: 62  SpO2: 97%  Weight: 172 lb (78 kg)  Height: 5\' 3"  (1.6 m)   Body mass index is 30.47 kg/m.  Gen: resting  comfortably, no acute distress HEENT: no scleral icterus, pupils equal round and reactive, no palptable cervical adenopathy,  CV: RRR, no m/r,g no jvd Resp: Clear to auscultation bilaterally GI: abdomen is soft, non-tender, non-distended, normal bowel sounds, no hepatosplenomegaly MSK: extremities are warm, no edema.  Skin: warm, no rash Neuro:  no focal deficits Psych: appropriate affect     Assessment and Plan   1.Paroxsymal afib vs atypical aflutter - no palpitations - EKG today shows sinus brady with mobitz I block.  - occasonal lightheadedness/dizziness with standing, irregular heart beats. Could be related to her wenchebach block, lower toprol to 75mg  bid. Encouraged regular hydration as well - xarelto and eliquis same price about 150$ a month, she elects to stay with eliquis as opposed to changing to coumadin, for now cost bearable.     2. HFimpEF - history of tachy mediated CM, LVEF normalized with control of her arrhythmias - some recent LE edema, start lasix 20mg  just prn   3.Mitral regurgitation - mild by most recent US, conitnue to monitor  4. HLD - LDL at goal, continue current meds  F/u 4 months    Antoine Poche, M.D.

## 2023-08-01 NOTE — Patient Instructions (Signed)
Medication Instructions:  Your physician has recommended you make the following change in your medication:   -DecreaseToprol XL to 75 mg tablet twice daily. -Start Lasix 20 mg tablet once daily as needed for swelling.  *If you need a refill on your cardiac medications before your next appointment, please call your pharmacy*   Lab Work: None If you have labs (blood work) drawn today and your tests are completely normal, you will receive your results only by: MyChart Message (if you have MyChart) OR A paper copy in the mail If you have any lab test that is abnormal or we need to change your treatment, we will call you to review the results.   Testing/Procedures: None   Follow-Up: At [facility], you and your health needs are our priority.  As part of our continuing mission to provide you with exceptional heart care, we have created designated Provider Care Teams.  These Care Teams include your primary Cardiologist (physician) and Advanced Practice Providers (APPs -  Physician Assistants and Nurse Practitioners) who all work together to provide you with the care you need, when you need it.  We recommend signing up for the patient portal called "MyChart".  Sign up information is provided on this After Visit Summary.  MyChart is used to connect with patients for Virtual Visits (Telemedicine).  Patients are able to view lab/test results, encounter notes, upcoming appointments, etc.  Non-urgent messages can be sent to your provider as well.   To learn more about what you can do with MyChart, go to [email or web address].    Your next appointment:   4 month(s)  Provider:   You may see Dina Rich, MD or one of the following Advanced Practice Providers on your designated Care Team:   Randall An, PA-C  Jacolyn Reedy, [place]     Other Instructions

## 2023-08-21 ENCOUNTER — Telehealth: Payer: Self-pay | Admitting: Cardiology

## 2023-08-21 NOTE — Telephone Encounter (Signed)
Pt c/o Shortness Of Breath: STAT if SOB developed within the last 24 hours or pt is noticeably SOB on the phone  1. Are you currently SOB (can you hear that pt is SOB on the phone)?  No  2. How long have you been experiencing SOB?  At least the past 2-3 weeks - patient states at last appointment Dr. Wyline Mood advised her to follow up if DOE persists.   3. Are you SOB when sitting or when up moving around?  When up and moving around  4. Are you currently experiencing any other symptoms?  Feels bloated when SOB

## 2023-08-21 NOTE — Telephone Encounter (Signed)
I spoke with patient and she agreed to take lasix 20 mg daily for 7 days and then call us with update and weight.

## 2023-08-21 NOTE — Telephone Encounter (Signed)
08/01/23 office note of dr.Branch:  2. HFimpEF - history of tachy mediated CM, LVEF normalized with control of her arrhythmias - some recent LE edema, start lasix 20mg  just prn    Heather Barber states her weight is 172 lbs, same as the office visit. She said she takes lasix 20 mg around 4 days a week and it has helped with her leg swelling but not the SOB she experiences whenever she walks. She said she makes very little urine.

## 2023-08-21 NOTE — Telephone Encounter (Signed)
Try taking the lasix 20mg  daily and update [place] in 1 week on weights and symptoms  Dominga Ferry MD

## 2023-09-12 ENCOUNTER — Other Ambulatory Visit: Payer: Medicare Other

## 2023-09-12 DIAGNOSIS — Z1589 Genetic susceptibility to other disease: Secondary | ICD-10-CM

## 2023-09-12 DIAGNOSIS — E538 Deficiency of other specified B group vitamins: Secondary | ICD-10-CM

## 2023-09-12 DIAGNOSIS — E611 Iron deficiency: Secondary | ICD-10-CM

## 2023-09-12 DIAGNOSIS — D473 Essential (hemorrhagic) thrombocythemia: Secondary | ICD-10-CM

## 2023-09-13 LAB — IRON AND TIBC
Iron Saturation: 11 % — ABNORMAL LOW (ref 15–55)
Iron: 50 ug/dL (ref 27–139)
Total Iron Binding Capacity: 455 ug/dL — ABNORMAL HIGH (ref 250–450)
UIBC: 405 ug/dL — ABNORMAL HIGH (ref 118–369)

## 2023-09-13 LAB — FERRITIN: Ferritin: 49 ng/mL (ref 15–150)

## 2023-09-16 LAB — CBC WITH DIFFERENTIAL/PLATELET
Basophils Absolute: 0.1 10*3/uL (ref 0.0–0.2)
Basos: 1 %
EOS (ABSOLUTE): 0.1 10*3/uL (ref 0.0–0.4)
Eos: 2 %
Hematocrit: 36.3 % (ref 34.0–46.6)
Hemoglobin: 12.3 g/dL (ref 11.1–15.9)
Immature Grans (Abs): 0 10*3/uL (ref 0.0–0.1)
Immature Granulocytes: 1 %
Lymphocytes Absolute: 2.2 10*3/uL (ref 0.7–3.1)
Lymphs: 34 %
MCH: 34.7 pg — ABNORMAL HIGH (ref 26.6–33.0)
MCHC: 33.9 g/dL (ref 31.5–35.7)
MCV: 103 fL — ABNORMAL HIGH (ref 79–97)
Monocytes Absolute: 0.6 10*3/uL (ref 0.1–0.9)
Monocytes: 9 %
Neutrophils Absolute: 3.5 10*3/uL (ref 1.4–7.0)
Neutrophils: 53 %
Platelets: 389 10*3/uL (ref 150–450)
RBC: 3.54 x10E6/uL — ABNORMAL LOW (ref 3.77–5.28)
RDW: 12 % (ref 11.7–15.4)
WBC: 6.5 10*3/uL (ref 3.4–10.8)

## 2023-09-16 LAB — COMPREHENSIVE METABOLIC PANEL WITH GFR
ALT: 10 IU/L (ref 0–32)
AST: 14 IU/L (ref 0–40)
Albumin: 4.4 g/dL (ref 3.8–4.8)
Alkaline Phosphatase: 61 IU/L (ref 44–121)
BUN/Creatinine Ratio: 23 (ref 12–28)
BUN: 18 mg/dL (ref 8–27)
Bilirubin Total: 0.2 mg/dL (ref 0.0–1.2)
CO2: 21 mmol/L (ref 20–29)
Calcium: 9.6 mg/dL (ref 8.7–10.3)
Chloride: 104 mmol/L (ref 96–106)
Creatinine, Ser: 0.79 mg/dL (ref 0.57–1.00)
Globulin, Total: 2.1 g/dL (ref 1.5–4.5)
Glucose: 152 mg/dL — ABNORMAL HIGH (ref 70–99)
Potassium: 4.4 mmol/L (ref 3.5–5.2)
Sodium: 141 mmol/L (ref 134–144)
Total Protein: 6.5 g/dL (ref 6.0–8.5)
eGFR: 77 mL/min/1.73

## 2023-09-16 LAB — VITAMIN B12: Vitamin B-12: 1258 pg/mL — ABNORMAL HIGH (ref 232–1245)

## 2023-09-16 LAB — LACTATE DEHYDROGENASE: LDH: 199 IU/L (ref 119–226)

## 2023-09-16 LAB — METHYLMALONIC ACID, SERUM: Methylmalonic Acid: 96 nmol/L (ref 0–378)

## 2023-09-18 NOTE — Progress Notes (Unsigned)
[facility] [address], [place] [address]   CLINIC:  Medical Oncology/Hematology  PCP:  Dettinger, Elige Radon, MD [address] [place] [address] [phone]   REASON FOR VISIT:  Follow-up for JAK2 positive essential thrombocytosis   PRIOR THERAPY: None   CURRENT THERAPY: Hydrea 500 mg once daily   INTERVAL HISTORY:   [name] 77 y.o. female returns for routine follow-up of her JAK2 positive essential thrombocytosis.  She was last seen by Rojelio Brenner PA-C on 06/11/2023.  At today's visit, she reports feeling fairly well.  No recent hospitalizations, surgeries, or changes in baseline health status.  She is tolerating Hydrea well at 500 mg daily.   She denies any GI symptoms such as nausea, vomiting, diarrhea.  She has not noted any mouth sores or skin ulcers.   Energy levels are good.   She denies any erythromelalgia, aquagenic pruritus, Raynaud's phenomenon, or vasomotor symptoms.  No interval DVT or PE.   No B symptoms such as fever, chills, night sweats, unintentional weight loss.    Regarding her anemia, she denies any chest pain, dyspnea on exertion, syncopal episodes.   She has not noticed any bleeding such as epistaxis, hematemesis, hematochezia, or melena.  However, she has had some microscopic hematuria 3 months, with unremarkable workup by urology.   No pelvic pain, B symptoms, or abnormal vaginal bleeding at this time  She has 100% energy and 100% appetite. She endorses that she is maintaining a stable weight.  ASSESSMENT & PLAN:  1.  JAK2 positive essential thrombocytosis - High risk because of her advanced age - Hydroxyurea 500 mg daily, currently tolerating well - No prior history of thrombosis.  She takes Eliquis for Afib. - She does not report any aquagenic pruritus or vasomotor symptoms.    - No lymphadenopathy or splenomegaly on exam today (09/19/2023) - Most recent labs (09/12/2023): Platelets 389.  Hgb 12.3/MCV 103.  Normal  WBC and differential.  Baseline CMP, normal LDH. - PLAN: Continue Hydrea 500 mg daily     - RTC 4 months for repeat labs and follow-up.   2.  Anemia w/ iron deficiency + B12 deficiency - Labs in November 2022 showed mildly decreased Hgb 11.1, MCV 120.8 (macrocytosis secondary to Hydrea). - She was also noted to have mild iron deficiency (ferritin 56, iron saturation 17%, TIBC 508) and B12 deficiency (147) - Additional labs for work-up of anemia were unremarkable (normal creatinine 0.86, normal folate, copper; SPEP and free light chains unremarkable) - She reports improved energy after starting back on daily B12 and iron supplements - Most recent labs (09/12/2023): Hgb 12.3/MCV 103.  Ferritin improved at 49, iron saturation 11%, elevated TIBC 455 improved from last visit.  Vitamin B12 has normalized at 1258, with normal MMA. - No bright blood per rectum or melena.  Microscopic hematuria with negative workup per urology. - Last EGD/colonoscopy was "many years ago."  She had negative Cologuard in September 2022. - Some degree of anemia secondary to Hydrea permissive in the setting of cytoreductive therapy for essential thrombocytosis, as well as anemia secondary to iron deficiency and B12 deficiency. - PLAN: Continue same dose of Hydrea as above. - Patient reminded to return stool cards x 3 as ordered at last visit - Continue ferrous sulfate 325 mg daily    - DECREASE B12 supplement 1000 mcg daily to every other day  (Recheck B12/MMA around May 2025) - CBC and iron panel in 4 months  3.  Stage Ia grade 1 endometrioid adenocarcinoma - Status post robotic staging on 12/09/2019. - MMR normal. - Due to low risk of recurrence, no adjuvant therapy was recommended. - Seen by GYN in June 2023 due to isolated episode of vaginal bleeding, no concerns noted per GYN note - No pelvic pain, B symptoms, or abnormal vaginal bleeding at this time     - PLAN: Continue follow-up with GYN every 6 months for 5  years.   PLAN SUMMARY: >> Check stool cards x 3  >> Labs in 4 months at [facility] (via [facility]) = CBC/D, CMP, LDH, ferritin, iron/TIBC >> OFFICE visit in 4 months (1 week after labs)     REVIEW OF SYSTEMS:   Review of Systems  Constitutional:  Negative for appetite change, chills, diaphoresis, fatigue, fever and unexpected weight change.  HENT:   Negative for lump/mass and nosebleeds.   Eyes:  Negative for eye problems.  Respiratory:  Negative for cough, hemoptysis and shortness of breath.   Cardiovascular:  Negative for chest pain, leg swelling and palpitations.  Gastrointestinal:  Negative for abdominal pain, blood in stool, constipation, diarrhea, nausea and vomiting.  Genitourinary:  Negative for dysuria and hematuria.   Skin: Negative.   Neurological:  Negative for dizziness, headaches and light-headedness.  Hematological:  Does not bruise/bleed easily.     PHYSICAL EXAM:  ECOG PERFORMANCE STATUS: 0 - Asymptomatic  Vitals:   09/19/23 0927  BP: (!) 147/69  Pulse: 76  Resp: 18  Temp: 98.2 F (36.8 C)  SpO2: 94%    Filed Weights   09/19/23 0927  Weight: 169 lb 9.6 oz (76.9 kg)    Physical Exam Constitutional:      Appearance: Normal appearance. She is obese.  Cardiovascular:     Heart sounds: Normal heart sounds.  Pulmonary:     Breath sounds: Normal breath sounds.  Neurological:     General: No focal deficit present.     Mental Status: Mental status is at baseline.  Psychiatric:        Behavior: Behavior normal. Behavior is cooperative.     PAST MEDICAL/SURGICAL HISTORY:  Past Medical History:  Diagnosis Date   (HFpEF) heart failure with preserved ejection fraction ([name])    a. EF previously 30-35% in 02/2021 in the setting of afib w/RVR --> normalized by repeat imaging in 05/2021.   Anxiety    Atrial flutter ([name])    a. s/p DCCV in 02/2021   Atrial septal defect    Surgical repair in 1994   Cardiomyopathy [facility])    Chest pain    Normal coronary  angiography in 1994 and 2011   Dizziness    Endometrial cancer, grade I ([name]) 10/09/2019   Essential thrombocythemia ([name]) 06/06/2016   GERD (gastroesophageal reflux disease)    History of kidney stones    Hyperlipidemia    Hypertension    LBBB (left bundle branch block) 2011   Mitral regurgitation    Palpitations    Persistent atrial fibrillation ([name])    PONV (postoperative nausea and vomiting)    Syncope    Tricuspid regurgitation    Type 2 diabetes mellitus with other specified complication ([name]) 07/18/2019   Ventricular tachycardia ([name])    Exercise induced   Past Surgical History:  Procedure Laterality Date   APPENDECTOMY     ASD REPAIR  1994   [facility]   BREAST EXCISIONAL BIOPSY     Right and left   BREAST SURGERY  right and left breast-benign   CARDIAC CATHETERIZATION  12/24/2009   Dr. Excell Seltzer    CARDIOVERSION N/A 03/04/2021   Procedure: CARDIOVERSION;  Surgeon: Antoine Poche, MD;  Location: [facility];  Service: Endoscopy;  Laterality: N/A;   CATARACT EXTRACTION W/PHACO Left 03/11/2013   Procedure: CATARACT EXTRACTION PHACO AND INTRAOCULAR LENS PLACEMENT (IOC);  Surgeon: Loraine Leriche T. Nile Riggs, MD;  Location: [facility];  Service: Ophthalmology;  Laterality: Left;  CDE:  12.20   HYSTEROSCOPY WITH D & C N/A 11/11/2019   Procedure: DILATATION AND CURETTAGE /HYSTEROSCOPY;  Surgeon: Tilda Burrow, MD;  Location: [facility];  Service: Gynecology;  Laterality: N/A;   POLYPECTOMY N/A 11/11/2019   Procedure: POLYPECTOMY(ENDOMETRIAL POLYP);  Surgeon: Tilda Burrow, MD;  Location: [facility];  Service: Gynecology;  Laterality: N/A;   ROBOTIC ASSISTED LAPAROSCOPIC HYSTERECTOMY AND SALPINGECTOMY Bilateral 12/09/2019   Procedure: XI ROBOTIC ASSISTED LAPAROSCOPIC HYSTERECTOMY BILATERAL SALPINGOOOPHORECTOMY;  Surgeon: Adolphus Birchwood, MD;  Location: [facility];  Service: Gynecology;  Laterality: Bilateral;   SENTINEL NODE BIOPSY N/A 12/09/2019   Procedure: SENTINEL LYMPH  NODE BIOPSY;  Surgeon:  Adolphus Birchwood, MD;  Location: [facility];  Service: Gynecology;  Laterality: N/A;   TEE WITHOUT CARDIOVERSION N/A 03/04/2021   Procedure: TRANSESOPHAGEAL ECHOCARDIOGRAM (TEE);  Surgeon: Antoine Poche, MD;  Location: [facility];  Service: Endoscopy;  Laterality: N/A;   TONSILLECTOMY     TUBAL LIGATION     Bilateral    SOCIAL HISTORY:  Social History   Socioeconomic History   Marital status: Widowed    Spouse name: widowed   Number of children: 3   Years of education: Not on file   Highest education level: Not on file  Occupational History   Occupation: Production designer, theatre/television/film shop  Tobacco Use   Smoking status: Never   Smokeless tobacco: Never  Vaping Use   Vaping status: Never Used  Substance and Sexual Activity   Alcohol use: No   Drug use: No   Sexual activity: Not Currently    Birth control/protection: Post-menopausal, Surgical    Comment: hyst  Other Topics Concern   Not on file  Social History Narrative   Married and resides in [place]. 3 children. 3 grandchildren.   Sedentary lifestyle   Social Determinants of Health   Financial Resource Strain: Low Risk  (03/29/2023)   Overall Financial Resource Strain (CARDIA)    Difficulty of Paying Living Expenses: Not hard at all  Food Insecurity: No Food Insecurity (03/29/2023)   Hunger Vital Sign    Worried About Running Out of Food in the Last Year: Never true    Ran Out of Food in the Last Year: Never true  Transportation Needs: No Transportation Needs (03/29/2023)   PRAPARE - Administrator, Civil Service (Medical): No    Lack of Transportation (Non-Medical): No  Physical Activity: Insufficiently Active (03/29/2023)   Exercise Vital Sign    Days of Exercise per Week: 1 day    Minutes of Exercise per Session: 10 min  Stress: No Stress Concern Present (03/29/2023)   [employer] of Occupational Health - Occupational Stress Questionnaire    Feeling of Stress : Not at all  Social Connections: Moderately Isolated  (03/29/2023)   Social Connection and Isolation Panel [NHANES]    Frequency of Communication with Friends and Family: Three times a week    Frequency of Social Gatherings with Friends and Family: Once a week    Attends Religious Services: More than 4 times per year  Active Member of Clubs or Organizations: No    Attends Banker Meetings: Never    Marital Status: Widowed  Intimate Partner Violence: Not At Risk (03/29/2023)   Humiliation, Afraid, Rape, and Kick questionnaire    Fear of Current or Ex-Partner: No    Emotionally Abused: No    Physically Abused: No    Sexually Abused: No    FAMILY HISTORY:  Family History  Problem Relation Age of Onset   Cancer Mother        female   Heart failure Father    Stroke Sister    Cancer Sister        breast   Coronary artery disease Neg Hx     CURRENT MEDICATIONS:  Outpatient Encounter Medications as of 09/19/2023  Medication Sig   Accu-Chek Softclix Lancets lancets 1 each by Other route daily at 12 noon. Use as instructed   amiodarone (PACERONE) 200 MG tablet TAKE 1/2 TABLET BY MOUTH DAILY   apixaban (ELIQUIS) 5 MG TABS tablet TAKE 1 TABLET BY MOUTH TWICE DAILY   atorvastatin (LIPITOR) 40 MG tablet Take 1 tablet (40 mg total) by mouth at bedtime.   cyanocobalamin (VITAMIN B12) 1000 MCG tablet Take 1 tablet (1,000 mcg total) by mouth daily.   ENTRESTO 24-26 MG TAKE 1 TABLET BY MOUTH TWICE A DAY   estradiol (ESTRACE) 0.1 MG/GM vaginal cream 1/2 inch in applicator per vagina 2 nights a week   fenofibrate 160 MG tablet TAKE 1 TABLET BY MOUTH EVERY DAY   ferrous sulfate 325 (65 FE) MG EC tablet Take 1 tablet (325 mg total) by mouth daily with breakfast.   furosemide (LASIX) 20 MG tablet Take 1 tablet (20 mg total) by mouth daily as needed (swelling).   glucose blood (ACCU-CHEK GUIDE) test strip 1 each by Other route daily. Use as instructed   hydroxyurea (HYDREA) 500 MG capsule Take 1 capsule (500 mg total) by mouth 2 (two)  times daily.   ketoconazole (NIZORAL) 2 % cream Apply 1 application topically 2 (two) times daily.   levothyroxine (SYNTHROID) 75 MCG tablet Take 1 tablet (75 mcg total) by mouth daily before breakfast.   loratadine (CLARITIN) 10 MG tablet Take by mouth.   metFORMIN (GLUCOPHAGE) 500 MG tablet Take 1 tablet (500 mg total) by mouth 2 (two) times daily with a meal.   methenamine (HIPREX) 1 g tablet Take 1 tablet (1 g total) by mouth 2 (two) times daily with a meal.   metoprolol succinate (TOPROL-XL) 50 MG 24 hr tablet Take 1.5 tablets (75 mg total) by mouth daily. Take with or immediately following a meal.   Multiple Vitamins-Minerals (MULTIVITAMIN WOMEN 50+) TABS Take by mouth daily.   naproxen sodium (ALEVE) 220 MG tablet Take 220 mg by mouth as needed.   omeprazole (PRILOSEC) 20 MG capsule Take 1 capsule (20 mg total) by mouth daily.   No facility-administered encounter medications on file as of 09/19/2023.    ALLERGIES:  Allergies  Allergen Reactions   Codeine Nausea And Vomiting   Morphine Other (See Comments)    GI symptoms    LABORATORY DATA:  I have reviewed the labs as listed.  CBC    Component Value Date/Time   WBC 6.5 09/12/2023 1543   WBC 6.7 06/01/2023 1222   RBC 3.54 (L) 09/12/2023 1543   RBC 3.15 (L) 06/01/2023 1222   HGB 12.3 09/12/2023 1543   HCT 36.3 09/12/2023 1543   PLT 389 09/12/2023 1543   MCV 103 (  H) 09/12/2023 1543   MCH 34.7 (H) 09/12/2023 1543   MCH 36.2 (H) 06/01/2023 1222   MCHC 33.9 09/12/2023 1543   MCHC 33.5 06/01/2023 1222   RDW 12.0 09/12/2023 1543   LYMPHSABS 2.2 09/12/2023 1543   MONOABS 0.5 06/01/2023 1222   EOSABS 0.1 09/12/2023 1543   BASOSABS 0.1 09/12/2023 1543      Latest Ref Rng & Units 09/12/2023    3:43 PM 07/19/2023    4:28 PM 06/01/2023   12:22 PM  CMP  Glucose 70 - 99 mg/dL 102  725  366   BUN 8 - 27 mg/dL 18  17  18    Creatinine 0.57 - 1.00 mg/dL 4.40  3.47  4.25   Sodium 134 - 144 mmol/L 141  143  137   Potassium 3.5 -  5.2 mmol/L 4.4  4.7  4.8   Chloride 96 - 106 mmol/L 104  103  102   CO2 20 - 29 mmol/L 21  22  26    Calcium 8.7 - 10.3 mg/dL 9.6  95.6  9.3   Total Protein 6.0 - 8.5 g/dL 6.5  6.4  6.8   Total Bilirubin 0.0 - 1.2 mg/dL 0.2  0.2  0.5   Alkaline Phos 44 - 121 IU/L 61  51  46   AST 0 - 40 IU/L 14  13  16    ALT 0 - 32 IU/L 10  13  19      DIAGNOSTIC IMAGING:  I have independently reviewed the relevant imaging and discussed with the patient.   WRAP UP:  All questions were answered. The patient knows to call the clinic with any problems, questions or concerns.  Medical decision making: Moderate  Time spent on visit: I spent 20 minutes counseling the patient face to face. The total time spent in the appointment was 30 minutes and more than 50% was on counseling.  Carnella Guadalajara, PA-C  09/19/23 10:00 AM

## 2023-09-19 ENCOUNTER — Inpatient Hospital Stay: Payer: Medicare Other | Attending: Physician Assistant | Admitting: Physician Assistant

## 2023-09-19 VITALS — BP 147/69 | HR 76 | Temp 98.2°F | Resp 18 | Wt 169.6 lb

## 2023-09-19 DIAGNOSIS — D75839 Thrombocytosis, unspecified: Secondary | ICD-10-CM | POA: Diagnosis present

## 2023-09-19 DIAGNOSIS — E538 Deficiency of other specified B group vitamins: Secondary | ICD-10-CM

## 2023-09-19 DIAGNOSIS — Z803 Family history of malignant neoplasm of breast: Secondary | ICD-10-CM | POA: Diagnosis not present

## 2023-09-19 DIAGNOSIS — Z8542 Personal history of malignant neoplasm of other parts of uterus: Secondary | ICD-10-CM | POA: Insufficient documentation

## 2023-09-19 DIAGNOSIS — D7589 Other specified diseases of blood and blood-forming organs: Secondary | ICD-10-CM | POA: Insufficient documentation

## 2023-09-19 DIAGNOSIS — Z1589 Genetic susceptibility to other disease: Secondary | ICD-10-CM | POA: Diagnosis not present

## 2023-09-19 DIAGNOSIS — D509 Iron deficiency anemia, unspecified: Secondary | ICD-10-CM | POA: Insufficient documentation

## 2023-09-19 DIAGNOSIS — D473 Essential (hemorrhagic) thrombocythemia: Secondary | ICD-10-CM | POA: Diagnosis present

## 2023-09-19 DIAGNOSIS — E611 Iron deficiency: Secondary | ICD-10-CM

## 2023-09-19 DIAGNOSIS — D539 Nutritional anemia, unspecified: Secondary | ICD-10-CM

## 2023-09-19 NOTE — Patient Instructions (Signed)
[facility] Discharge Instructions  You were seen today by Rojelio Brenner PA-C for your elevated platelets and mild anemia.  Your platelet levels look great!  Continue hydroxyurea (Hydrea) to 500 mg (1 tablet) once per day.  We will recheck your levels again in 3 months.  LABS: Return in 4 months for repeat labs  MEDICATIONS: - Continue Hydrea to 500 mg (1 tablet) once daily - CONTINUE taking ferrous sulfate (iron supplement) 325 mg daily.   - DECREASE your vitamin B12 (cyanocobalamin) 1,000 mcg (1 mg) to take this EVERY OTHER DAY (instead of daily).  FOLLOW-UP APPOINTMENT: Office visit in 4 months with labs the week before   Thank you for choosing [facility] to provide your oncology and hematology care.  To afford each patient quality time with our provider, please arrive at least 15 minutes before your scheduled appointment time.   If you have a lab appointment with the Cancer Center please come in thru the Main Entrance and check in at the main information desk.  You need to re-schedule your appointment should you arrive 10 or more minutes late.  We strive to give you quality time with our providers, and arriving late affects you and other patients whose appointments are after yours.  Also, if you no show three or more times for appointments you may be dismissed from the clinic at the providers discretion.     Again, thank you for choosing [facility].  Our hope is that these requests will decrease the amount of time that you wait before being seen by our physicians.       _____________________________________________________________  Should you have questions after your visit to [facility], please contact our office at [phone] and follow the prompts.  Our office hours are 8:00 a.m. and 4:30 p.m. Monday - Friday.  Please note that voicemails left after 4:00 p.m. may not be  returned until the following business day.  We are closed weekends and major holidays.  You do have access to a nurse 24-7, just call the main number to the clinic [phone] and do not press any options, hold on the line and a nurse will answer the phone.    For prescription refill requests, have your pharmacy contact our office and allow 72 hours.    Due to Covid, you will need to wear a mask upon entering the hospital. If you do not have a mask, a mask will be given to you at the Main Entrance upon arrival. For doctor visits, patients may have 1 support person age 46 or older with them. For treatment visits, patients can not have anyone with them due to social distancing guidelines and our immunocompromised population.

## 2023-09-28 ENCOUNTER — Other Ambulatory Visit: Payer: Self-pay | Admitting: Family Medicine

## 2023-09-28 DIAGNOSIS — E1169 Type 2 diabetes mellitus with other specified complication: Secondary | ICD-10-CM

## 2023-10-09 ENCOUNTER — Ambulatory Visit: Payer: Medicare Other | Admitting: Urology

## 2023-10-28 ENCOUNTER — Other Ambulatory Visit: Payer: Self-pay | Admitting: Family Medicine

## 2023-10-28 DIAGNOSIS — E1169 Type 2 diabetes mellitus with other specified complication: Secondary | ICD-10-CM

## 2023-10-29 MED ORDER — METFORMIN HCL 500 MG PO TABS: 500.0000 mg | ORAL_TABLET | Freq: Two times a day (BID) | ORAL | 0 refills | Status: DC

## 2023-10-29 NOTE — Telephone Encounter (Signed)
I called pt & made her an appt on 11-26-2023 w/Dettinger for med refill. I told pt that her last OV was on 07-19-2023.

## 2023-10-29 NOTE — Addendum Note (Signed)
Addended by: Julious Payer D on: 10/29/2023 01:02 PM   Modules accepted: Orders

## 2023-10-29 NOTE — Telephone Encounter (Signed)
Dettinger pt NTBS 30-d given 10/01/23

## 2023-11-01 ENCOUNTER — Ambulatory Visit: Payer: Medicare Other | Admitting: Nurse Practitioner

## 2023-11-01 ENCOUNTER — Encounter: Payer: Self-pay | Admitting: Nurse Practitioner

## 2023-11-01 ENCOUNTER — Other Ambulatory Visit: Payer: Self-pay | Admitting: Cardiology

## 2023-11-01 VITALS — BP 119/70 | HR 90 | Temp 98.4°F | Ht 63.0 in | Wt 166.0 lb

## 2023-11-01 DIAGNOSIS — J40 Bronchitis, not specified as acute or chronic: Secondary | ICD-10-CM

## 2023-11-01 MED ORDER — PREDNISONE 20 MG PO TABS: 40.0000 mg | ORAL_TABLET | Freq: Every day | ORAL | 0 refills | Status: AC

## 2023-11-01 NOTE — Progress Notes (Signed)
 Subjective:    [name], female    DOB: 1946-08-16, 78 y.o.   MRN: [id number]   Chief Complaint: Cough (Started on Friday. Congested has stopped but the cough still lingers. Very productive and dark green mucous. SOB at night. Taking left benzonatate .)   Cough This is a new problem. The current episode started in the past 7 days. The problem has been gradually improving. The problem occurs every few minutes. The cough is Productive of sputum. Associated symptoms include ear congestion and rhinorrhea. Pertinent negatives include no chills, fever or shortness of breath. Nothing aggravates the symptoms. She has tried OTC cough suppressant for the symptoms. The treatment provided mild relief.    Patient Active Problem List   Diagnosis Date Noted   Encounter for screening fecal occult blood testing 03/29/2023   Urinary frequency 03/29/2023   Hematuria 03/29/2023   Visit for pelvic exam 09/14/2022   Hypothyroidism 07/06/2022   A-fib ([name]) 03/01/2021   Hypertriglyceridemia 09/16/2020   Endometrial cancer, grade I ([name]) 10/09/2019   PMB (postmenopausal bleeding) 10/09/2019   Type 2 diabetes mellitus with other specified complication ([name]) 07/18/2019   SVT (supraventricular tachycardia) ([name]) 05/21/2019   Gastroesophageal reflux disease 07/24/2016   Thrombocytosis 06/06/2016   LBBB (left bundle branch block)    Ventricular tachycardia ([name])    ASD (atrial septal defect)    Hypertension    Hyperlipidemia 04/27/2010       Review of Systems  Constitutional:  Positive for fatigue. Negative for chills and fever.  HENT:  Positive for rhinorrhea. Negative for congestion.   Respiratory:  Positive for cough. Negative for shortness of breath.        Objective:   Physical Exam HENT:     Right Ear: Tympanic membrane normal.     Left Ear: Tympanic membrane normal.     Nose: Congestion present. No rhinorrhea.     Mouth/Throat:     Mouth: Mucous membranes are moist.      Pharynx: Oropharynx is clear.  Eyes:     Pupils: Pupils are equal, round, and reactive to light.  Cardiovascular:     Rate and Rhythm: Normal rate and regular rhythm.  Pulmonary:     Effort: Pulmonary effort is normal.     Breath sounds: Normal breath sounds.     Comments: Deep tight cough Skin:    General: Skin is warm.  Neurological:     General: No focal deficit present.     Mental Status: She is oriented to person, place, and time.  Psychiatric:        Mood and Affect: Mood normal.        Behavior: Behavior normal.    BP 119/70   Pulse 90   Temp 98.4 F (36.9 C) (Temporal)   Ht 5' 3 (1.6 m)   Wt 166 lb (75.3 kg)   SpO2 95%   BMI 29.41 kg/m         Assessment & Plan:   KITTI MCCLISH in today with chief complaint of Cough (Started on Friday. Congested has stopped but the cough still lingers. Very productive and dark green mucous. SOB at night. Taking left benzonatate .)   1. Bronchitis (Primary) 1. Take meds as prescribed 2. Use a cool mist humidifier especially during the winter months and when heat has been humid. 3. Use saline nose sprays frequently 4. Saline irrigations of the nose can be very helpful if done frequently.  * 4X daily for 1 week*  *  Use of a nettie pot can be helpful with this. Follow directions with this* 5. Drink plenty of fluids 6. Keep thermostat turn down low 7.For any cough or congestion- mucinex OTC 8. For fever or aces or pains- take tylenol  or ibuprofen  appropriate for age and weight.  * for fevers greater than 101 orally you may alternate ibuprofen  and tylenol  every  3 hours.   Meds ordered this encounter  Medications   predniSONE  (DELTASONE ) 20 MG tablet    Sig: Take 2 tablets (40 mg total) by mouth daily with breakfast for 5 days. 2 po daily for 5 days    Dispense:  10 tablet    Refill:  0    Supervising Provider:   MARYANNE CHEW A [[id number]]       The above assessment and management plan was discussed with the  patient. The patient verbalized understanding of and has agreed to the management plan. Patient is aware to call the clinic if symptoms persist or worsen. Patient is aware when to return to the clinic for a follow-up visit. Patient educated on when it is appropriate to go to the emergency department.   Mary-Margaret Gladis, FNP

## 2023-11-01 NOTE — Patient Instructions (Addendum)
 1. Take meds as prescribed 2. Use a cool mist humidifier especially during the winter months and when heat has been humid. 3. Use saline nose sprays frequently 4. Saline irrigations of the nose can be very helpful if done frequently.  * 4X daily for 1 week*  * Use of a nettie pot can be helpful with this. Follow directions with this* 5. Drink plenty of fluids 6. Keep thermostat turn down low 7.For any cough or congestion- mucinex OTC 8. For fever or aces or pains- take tylenol or ibuprofen appropriate for age and weight.  * for fevers greater than 101 orally you may alternate ibuprofen and tylenol every  3 hours.

## 2023-11-02 NOTE — Telephone Encounter (Signed)
 Prescription refill request for Eliquis received. Indication: PAF Last office visit: 08/01/23  Dominga Ferry MD Scr: 0.79 on 09/12/23  Epic Age: 78 Weight: 78kg  Based on above findings Eliquis 5mg  twice daily is the appropriate dose.  Refill approved.

## 2023-11-04 ENCOUNTER — Other Ambulatory Visit: Payer: Self-pay | Admitting: Family Medicine

## 2023-11-04 ENCOUNTER — Other Ambulatory Visit: Payer: Self-pay | Admitting: Cardiology

## 2023-11-04 DIAGNOSIS — K219 Gastro-esophageal reflux disease without esophagitis: Secondary | ICD-10-CM

## 2023-11-19 ENCOUNTER — Other Ambulatory Visit: Payer: Self-pay | Admitting: Urology

## 2023-11-19 DIAGNOSIS — N3000 Acute cystitis without hematuria: Secondary | ICD-10-CM

## 2023-11-19 NOTE — Progress Notes (Signed)
History of Present Illness: Heather Barber is a 78 y.o. year old female here for cysto following recent CT for hematuria.  1. No obstructive uropathy. No renal or ureteric calculi. No suspicious renal mass. 2. No acute inflammatory process in the abdomen or pelvis. 3. Multiple other nonacute observations, as described above.  Past Medical History:  Diagnosis Date   (HFpEF) heart failure with preserved ejection fraction ([name])    a. EF previously 30-35% in 02/2021 in the setting of afib w/RVR --> normalized by repeat imaging in 05/2021.   Anxiety    Atrial flutter ([name])    a. s/p DCCV in 02/2021   Atrial septal defect    Surgical repair in 1994   Cardiomyopathy [facility])    Chest pain    Normal coronary angiography in 1994 and 2011   Dizziness    Endometrial cancer, grade I ([name]) 10/09/2019   Essential thrombocythemia ([name]) 06/06/2016   GERD (gastroesophageal reflux disease)    History of kidney stones    Hyperlipidemia    Hypertension    LBBB (left bundle branch block) 2011   Mitral regurgitation    Palpitations    Persistent atrial fibrillation ([name])    PONV (postoperative nausea and vomiting)    Syncope    Tricuspid regurgitation    Type 2 diabetes mellitus with other specified complication ([name]) 07/18/2019   Ventricular tachycardia ([name])    Exercise induced    Past Surgical History:  Procedure Laterality Date   APPENDECTOMY     ASD REPAIR  1994   [facility]   BREAST EXCISIONAL BIOPSY     Right and left   BREAST SURGERY     right and left breast-benign   CARDIAC CATHETERIZATION  12/24/2009   Dr. Excell Seltzer    CARDIOVERSION N/A 03/04/2021   Procedure: CARDIOVERSION;  Surgeon: Antoine Poche, MD;  Location: [facility];  Service: Endoscopy;  Laterality: N/A;   CATARACT EXTRACTION W/PHACO Left 03/11/2013   Procedure: CATARACT EXTRACTION PHACO AND INTRAOCULAR LENS PLACEMENT (IOC);  Surgeon: Loraine Leriche T. Nile Riggs, MD;  Location: [facility];  Service: Ophthalmology;  Laterality:  Left;  CDE:  12.20   HYSTEROSCOPY WITH D & C N/A 11/11/2019   Procedure: DILATATION AND CURETTAGE /HYSTEROSCOPY;  Surgeon: Tilda Burrow, MD;  Location: [facility];  Service: Gynecology;  Laterality: N/A;   POLYPECTOMY N/A 11/11/2019   Procedure: POLYPECTOMY(ENDOMETRIAL POLYP);  Surgeon: Tilda Burrow, MD;  Location: [facility];  Service: Gynecology;  Laterality: N/A;   ROBOTIC ASSISTED LAPAROSCOPIC HYSTERECTOMY AND SALPINGECTOMY Bilateral 12/09/2019   Procedure: XI ROBOTIC ASSISTED LAPAROSCOPIC HYSTERECTOMY BILATERAL SALPINGOOOPHORECTOMY;  Surgeon: Adolphus Birchwood, MD;  Location: [facility];  Service: Gynecology;  Laterality: Bilateral;   SENTINEL NODE BIOPSY N/A 12/09/2019   Procedure: SENTINEL LYMPH  NODE BIOPSY;  Surgeon: Adolphus Birchwood, MD;  Location: [facility];  Service: Gynecology;  Laterality: N/A;   TEE WITHOUT CARDIOVERSION N/A 03/04/2021   Procedure: TRANSESOPHAGEAL ECHOCARDIOGRAM (TEE);  Surgeon: Antoine Poche, MD;  Location: [facility];  Service: Endoscopy;  Laterality: N/A;   TONSILLECTOMY     TUBAL LIGATION     Bilateral    Home Medications:  (Not in a hospital admission)   Allergies:  Allergies  Allergen Reactions   Codeine Nausea And Vomiting   Morphine Other (See Comments)    GI symptoms    Family History  Problem Relation Age of Onset   Cancer Mother        female   Heart failure Father  Stroke Sister    Cancer Sister        breast   Coronary artery disease Neg Hx     Social History:  reports that she has never smoked. She has never used smokeless tobacco. She reports that she does not drink alcohol and does not use drugs.  ROS: A complete review of systems was performed.  All systems are negative except for pertinent findings as noted.  Physical Exam:  Vital signs in last 24 hours: @VSRANGES @ General:  Alert and oriented, No acute distress HEENT: Normocephalic, atraumatic Neck: No JVD or lymphadenopathy Cardiovascular: Regular rate  Lungs: Normal  inspiratory/expiratory excursion Abdomen: Soft, nontender, nondistended, no abdominal masses Back: No CVA tenderness Extremities: No edema Neurologic: Grossly intact  I have reviewed prior pt notes  I have reviewed urinalysis results  I have independently reviewed prior imaging--CT    Cystoscopy Procedure Note:  Indication: Hematuria       Impression/Assessment:  Persistent hematuria with negative CT with the exception of bilateral renal cysts. Cytology revealed no evidence of HG TCCA    Plan:    Chelsea Aus 11/19/2023, 12:53 PM  Bertram Millard. Blessing Ozga MD

## 2023-11-20 ENCOUNTER — Ambulatory Visit (INDEPENDENT_AMBULATORY_CARE_PROVIDER_SITE_OTHER): Payer: Medicare Other | Admitting: Urology

## 2023-11-20 ENCOUNTER — Encounter: Payer: Self-pay | Admitting: Urology

## 2023-11-20 VITALS — BP 146/67 | HR 78

## 2023-11-20 DIAGNOSIS — Z87440 Personal history of urinary (tract) infections: Secondary | ICD-10-CM | POA: Diagnosis not present

## 2023-11-20 DIAGNOSIS — N3000 Acute cystitis without hematuria: Secondary | ICD-10-CM

## 2023-11-20 DIAGNOSIS — R3129 Other microscopic hematuria: Secondary | ICD-10-CM

## 2023-11-20 LAB — URINALYSIS, ROUTINE W REFLEX MICROSCOPIC
Bilirubin, UA: NEGATIVE
Glucose, UA: NEGATIVE
Ketones, UA: NEGATIVE
Nitrite, UA: NEGATIVE
Specific Gravity, UA: 1.03 (ref 1.005–1.030)
Urobilinogen, Ur: 1 mg/dL (ref 0.2–1.0)
pH, UA: 6 (ref 5.0–7.5)

## 2023-11-20 LAB — MICROSCOPIC EXAMINATION: WBC, UA: >30 /[HPF] — AB (ref 0–5)

## 2023-11-21 LAB — CYTOLOGY, URINE

## 2023-11-23 ENCOUNTER — Encounter: Payer: Self-pay | Admitting: Urology

## 2023-11-26 ENCOUNTER — Ambulatory Visit (INDEPENDENT_AMBULATORY_CARE_PROVIDER_SITE_OTHER): Payer: Medicare Other

## 2023-11-26 ENCOUNTER — Ambulatory Visit: Payer: Medicare Other | Admitting: Family Medicine

## 2023-11-26 ENCOUNTER — Encounter: Payer: Self-pay | Admitting: Family Medicine

## 2023-11-26 VITALS — BP 127/67 | Ht 63.0 in | Wt 173.0 lb

## 2023-11-26 DIAGNOSIS — G9331 Postviral fatigue syndrome: Secondary | ICD-10-CM

## 2023-11-26 DIAGNOSIS — E782 Mixed hyperlipidemia: Secondary | ICD-10-CM

## 2023-11-26 DIAGNOSIS — E1169 Type 2 diabetes mellitus with other specified complication: Secondary | ICD-10-CM

## 2023-11-26 DIAGNOSIS — R0602 Shortness of breath: Secondary | ICD-10-CM | POA: Diagnosis not present

## 2023-11-26 DIAGNOSIS — I10 Essential (primary) hypertension: Secondary | ICD-10-CM

## 2023-11-26 DIAGNOSIS — Z7984 Long term (current) use of oral hypoglycemic drugs: Secondary | ICD-10-CM

## 2023-11-26 DIAGNOSIS — E781 Pure hyperglyceridemia: Secondary | ICD-10-CM

## 2023-11-26 DIAGNOSIS — E039 Hypothyroidism, unspecified: Secondary | ICD-10-CM | POA: Diagnosis not present

## 2023-11-26 LAB — BAYER DCA HB A1C WAIVED: HB A1C (BAYER DCA - WAIVED): 7.2 % — ABNORMAL HIGH (ref 4.8–5.6)

## 2023-11-26 LAB — LIPID PANEL

## 2023-11-26 MED ORDER — PREDNISONE 20 MG PO TABS: ORAL_TABLET | ORAL | 0 refills | Status: DC

## 2023-11-26 NOTE — Progress Notes (Signed)
BP 127/67   Ht 5\' 3"  (1.6 m)   Wt 173 lb (78.5 kg)   SpO2 99%   BMI 30.65 kg/m    Subjective:   [name], female    DOB: 1946/06/30, 78 y.o.   MRN: [id number]  HPI: Heather Barber is a 78 y.o. female presenting on 11/26/2023 for Medical Management of Chronic Issues, Diabetes, and Shortness of Breath (With walking. Had flu 2-3w ago)  Type 2 diabetes mellitus Patient comes in today for recheck of her diabetes. Patient is currently taking metformin. Fasting blood sugar averages around 140 at home, though her blood glucose has been in the 60s a few times. She is asymptomatic during these episodes. Patient has not seen an ophthalmologist this year. Patient denies any new issues with her feet. Her diabetes is complicated by hyperlipidemia.   Hyperlipidemia and hypertriglyceridemia Patient is currently taking atorvastatin and fenofibrate. She denies myalgias or weakness. She does not have a history of liver damage from it.   Hypertension and A-fib Patient is currently taking metoprolol, Entresto, amiodarone, and apixaban. Her blood pressure today is 127/67. She denies lightheadedness or dizziness, headaches, vision changes, or chest pain.  Hypothyroidism recheck Patient is currently taking levothyroxine 75 mcg. She denies any hair or skin changes, heat or cold intolerance, and diarrhea or constipation.   Patient also reports a 2-3 week history of dyspnea with exertion. She has to pause to catch her breath after walking a short distance, and she feels like her heart rate also gets fast when she is winded. She was diagnosed with influenza 3-4 weeks ago and reports her symptoms were relatively severe during this infection. She had a bad cough and was overall very fatigued and weak. She completed a 5-day course of steroids on 11/05/2023 and feels like her cough has somewhat improved since that time. She also reports lower extremity edema for the past 2-3 weeks that worsens during the  day but resolves over night. Her heart doctor gave her a prescription of furosemide, but she has not noticed any differences in the swelling. She denies recent fevers, chills, or orthopnea.  Relevant past medical, surgical, family and social history reviewed and updated as indicated. Interim medical history since our last visit reviewed. Allergies and medications reviewed and updated.  Review of Systems  Constitutional:  Negative for chills and fever.  HENT:  Negative for congestion, ear pain, rhinorrhea, sinus pain and sore throat.   Eyes:  Negative for pain and visual disturbance.  Respiratory:  Positive for cough and shortness of breath. Negative for chest tightness and wheezing.   Cardiovascular:  Positive for palpitations and leg swelling. Negative for chest pain.  Gastrointestinal:  Negative for abdominal distention, abdominal pain, constipation and diarrhea.  Endocrine: Negative for cold intolerance, heat intolerance and polyuria.  Genitourinary:  Negative for dysuria, frequency and hematuria.  Musculoskeletal:  Negative for myalgias.  Skin:  Negative for wound.  Neurological:  Negative for dizziness, syncope, weakness, light-headedness and headaches.  Psychiatric/Behavioral:  Negative for sleep disturbance.     Per HPI unless specifically indicated above   Allergies as of 11/26/2023       Reactions   Codeine Nausea And Vomiting   Morphine Other (See Comments)   GI symptoms        Medication List        Accurate as of November 26, 2023  4:27 PM. If you have any questions, ask your nurse or doctor.  Accu-Chek Guide test strip Generic drug: glucose blood 1 each by Other route daily. Use as instructed   Accu-Chek Softclix Lancets lancets 1 each by Other route daily at 12 noon. Use as instructed   amiodarone 200 MG tablet Commonly known as: PACERONE TAKE 1/2 TABLET BY MOUTH DAILY   atorvastatin 40 MG tablet Commonly known as: LIPITOR Take 1 tablet (40  mg total) by mouth at bedtime.   Eliquis 5 MG Tabs tablet Generic drug: apixaban TAKE 1 TABLET BY MOUTH TWICE A DAY   Entresto 24-26 MG Generic drug: sacubitril-valsartan TAKE 1 TABLET BY MOUTH TWICE A DAY   estradiol 0.1 MG/GM vaginal cream Commonly known as: ESTRACE 1/2 inch in applicator per vagina 2 nights a week   fenofibrate 160 MG tablet TAKE 1 TABLET BY MOUTH EVERY DAY   ferrous sulfate 325 (65 FE) MG EC tablet Take 1 tablet (325 mg total) by mouth daily with breakfast.   furosemide 20 MG tablet Commonly known as: LASIX Take 1 tablet (20 mg total) by mouth daily as needed (swelling).   hydroxyurea 500 MG capsule Commonly known as: HYDREA Take 1 capsule (500 mg total) by mouth 2 (two) times daily.   ketoconazole 2 % cream Commonly known as: NIZORAL Apply 1 application topically 2 (two) times daily.   levothyroxine 75 MCG tablet Commonly known as: SYNTHROID Take 1 tablet (75 mcg total) by mouth daily before breakfast.   metFORMIN 500 MG tablet Commonly known as: GLUCOPHAGE Take 1 tablet (500 mg total) by mouth 2 (two) times daily with a meal.   methenamine 1 g tablet Commonly known as: HIPREX TAKE 1 TABLET (1 G TOTAL) BY MOUTH 2 (TWO) TIMES DAILY WITH A MEAL.   metoprolol succinate 50 MG 24 hr tablet Commonly known as: TOPROL-XL Take 1.5 tablets (75 mg total) by mouth daily. Take with or immediately following a meal.   Multivitamin Women 50+ Tabs Take by mouth daily.   naproxen sodium 220 MG tablet Commonly known as: ALEVE Take 220 mg by mouth as needed.   omeprazole 20 MG capsule Commonly known as: PRILOSEC TAKE 1 CAPSULE BY MOUTH EVERY DAY   predniSONE 20 MG tablet Commonly known as: DELTASONE 2 po at same time daily for 5 days Started by: Elige Radon Mathan Darroch         Objective:   BP 127/67   Ht 5\' 3"  (1.6 m)   Wt 173 lb (78.5 kg)   SpO2 99%   BMI 30.65 kg/m   Wt Readings from Last 3 Encounters:  11/26/23 173 lb (78.5 kg)  11/01/23  166 lb (75.3 kg)  09/19/23 169 lb 9.6 oz (76.9 kg)    Physical Exam Vitals and nursing note reviewed.  Constitutional:      General: She is not in acute distress.    Appearance: Normal appearance. She is well-developed. She is obese.  HENT:     Head: Normocephalic and atraumatic.     Right Ear: External ear normal.     Left Ear: External ear normal.  Eyes:     Conjunctiva/sclera: Conjunctivae normal.  Cardiovascular:     Rate and Rhythm: Normal rate and regular rhythm.     Heart sounds: Normal heart sounds.  Pulmonary:     Effort: Pulmonary effort is normal.     Breath sounds: Normal breath sounds. No wheezing or rales.  Abdominal:     General: Abdomen is flat. There is no distension.     Palpations: Abdomen is soft.  Tenderness: There is no abdominal tenderness. There is no right CVA tenderness or left CVA tenderness.  Musculoskeletal:     Cervical back: Normal range of motion and neck supple. No tenderness.     Right lower leg: Edema (+1 pitting edema around ankle, trace edema to mid-shin) present.     Left lower leg: Edema (+1 pitting edema around ankle, trace edema to mid-shin) present.  Skin:    General: Skin is warm and dry.  Neurological:     Mental Status: She is alert and oriented to person, place, and time.     Sensory: No sensory deficit (sensation intact on 9-point diabetic foot exam).     Gait: Gait normal.  Psychiatric:        Mood and Affect: Mood normal.        Behavior: Behavior normal.        Thought Content: Thought content normal.        Judgment: Judgment normal.    Assessment & Plan:   Problem List Items Addressed This Visit       Cardiovascular and Mediastinum   Hypertension   Relevant Orders   CBC with Differential/Platelet   CMP14+EGFR   Lipid panel   Bayer DCA Hb A1c Waived   TSH     Endocrine   Type 2 diabetes mellitus with other specified complication ([name]) - Primary   Relevant Orders   CBC with Differential/Platelet    CMP14+EGFR   Lipid panel   Bayer DCA Hb A1c Waived   TSH   Microalbumin / creatinine urine ratio   Hypothyroidism   Relevant Orders   CBC with Differential/Platelet   CMP14+EGFR   Lipid panel   Bayer DCA Hb A1c Waived   TSH     Other   Hyperlipidemia   Relevant Orders   CBC with Differential/Platelet   CMP14+EGFR   Lipid panel   Bayer DCA Hb A1c Waived   TSH   Hypertriglyceridemia   Other Visit Diagnoses       Exertional shortness of breath       Relevant Medications   predniSONE (DELTASONE) 20 MG tablet   Other Relevant Orders   DG Chest 2 View     Postviral syndrome       Relevant Medications   predniSONE (DELTASONE) 20 MG tablet       Blood pressure well-controlled at 127/67. HgbA1c stable at 7.2%. Will not increase metformin dose since she has recently had asymptomatic hypoglycemic episodes in the 60s. Patient is tolerating atorvastatin and fenofibrate well. Will recheck lipid panel today. Hypothyroidism seems stable, but will also recheck TSH today. Suspect dyspnea is from post-viral syndrome. Will give another 5-day course of prednisone to help reduce lingering inflammation. Do not think patient is having a heart failure exacerbation given normal heart and lung exam, but she is already scheduled to see cardiology on 12/05/2023.  Follow up plan: Return in about 3 months (around 02/24/2024), or if symptoms worsen or fail to improve, for Diabetes recheck.  Counseling provided for all of the vaccine components Orders Placed This Encounter  Procedures   DG Chest 2 View   CBC with Differential/Platelet   CMP14+EGFR   Lipid panel   Bayer DCA Hb A1c Waived   TSH   Microalbumin / creatinine urine ratio    Gillermina Phy, Medical Student [facility] 11/26/2023, 4:27 PM  I was personally present for all components of the history, physical exam and/or medical decision making.  I agree  with the documentation performed by the student and agree with  assessment and plan above.  Likely postviral syndrome, short daily course of prednisone. Arville Care, MD [facility] 11/30/2023, 9:45 AM

## 2023-11-27 LAB — CBC WITH DIFFERENTIAL/PLATELET
Basophils Absolute: 0 10*3/uL (ref 0.0–0.2)
Basos: 1 %
EOS (ABSOLUTE): 0.1 10*3/uL (ref 0.0–0.4)
Eos: 3 %
Hematocrit: 33.9 % — ABNORMAL LOW (ref 34.0–46.6)
Hemoglobin: 11.5 g/dL (ref 11.1–15.9)
Immature Grans (Abs): 0 10*3/uL (ref 0.0–0.1)
Immature Granulocytes: 0 %
Lymphocytes Absolute: 1.9 10*3/uL (ref 0.7–3.1)
Lymphs: 36 %
MCH: 33 pg (ref 26.6–33.0)
MCHC: 33.9 g/dL (ref 31.5–35.7)
MCV: 97 fL (ref 79–97)
Monocytes Absolute: 0.5 10*3/uL (ref 0.1–0.9)
Monocytes: 9 %
Neutrophils Absolute: 2.7 10*3/uL (ref 1.4–7.0)
Neutrophils: 51 %
Platelets: 385 10*3/uL (ref 150–450)
RBC: 3.48 x10E6/uL — ABNORMAL LOW (ref 3.77–5.28)
RDW: 12.8 % (ref 11.7–15.4)
WBC: 5.2 10*3/uL (ref 3.4–10.8)

## 2023-11-27 LAB — CMP14+EGFR
ALT: 10 IU/L (ref 0–32)
AST: 9 IU/L (ref 0–40)
Albumin: 4.4 g/dL (ref 3.8–4.8)
Alkaline Phosphatase: 59 IU/L (ref 44–121)
BUN/Creatinine Ratio: 19 (ref 12–28)
BUN: 22 mg/dL (ref 8–27)
Bilirubin Total: 0.3 mg/dL (ref 0.0–1.2)
CO2: 18 mmol/L — ABNORMAL LOW (ref 20–29)
Calcium: 9.2 mg/dL (ref 8.7–10.3)
Chloride: 105 mmol/L (ref 96–106)
Creatinine, Ser: 1.17 mg/dL — ABNORMAL HIGH (ref 0.57–1.00)
Globulin, Total: 2.1 g/dL (ref 1.5–4.5)
Glucose: 127 mg/dL — ABNORMAL HIGH (ref 70–99)
Potassium: 5.6 mmol/L — ABNORMAL HIGH (ref 3.5–5.2)
Sodium: 144 mmol/L (ref 134–144)
Total Protein: 6.5 g/dL (ref 6.0–8.5)
eGFR: 48 mL/min/{1.73_m2} — ABNORMAL LOW (ref >59)

## 2023-11-27 LAB — LIPID PANEL
Cholesterol, Total: 146 mg/dL (ref 100–199)
HDL: 41 mg/dL (ref >39)
LDL CALC COMMENT:: 3.6 ratio (ref 0.0–4.4)
LDL Chol Calc (NIH): 79 mg/dL (ref 0–99)
Triglycerides: 151 mg/dL — ABNORMAL HIGH (ref 0–149)
VLDL Cholesterol Cal: 26 mg/dL (ref 5–40)

## 2023-11-27 LAB — TSH: TSH: 5.6 u[IU]/mL — ABNORMAL HIGH (ref 0.450–4.500)

## 2023-11-30 NOTE — Telephone Encounter (Signed)
 FYI and advise

## 2023-12-03 ENCOUNTER — Other Ambulatory Visit: Payer: Self-pay | Admitting: Urology

## 2023-12-03 DIAGNOSIS — N3000 Acute cystitis without hematuria: Secondary | ICD-10-CM

## 2023-12-03 MED ORDER — CEPHALEXIN 500 MG PO CAPS: 500.0000 mg | ORAL_CAPSULE | Freq: Two times a day (BID) | ORAL | 0 refills | Status: AC

## 2023-12-05 ENCOUNTER — Ambulatory Visit: Payer: Medicare Other | Attending: Cardiology | Admitting: Cardiology

## 2023-12-05 ENCOUNTER — Encounter: Payer: Self-pay | Admitting: Cardiology

## 2023-12-05 VITALS — BP 160/80 | HR 75 | Ht 64.0 in | Wt 169.6 lb

## 2023-12-05 DIAGNOSIS — I5032 Chronic diastolic (congestive) heart failure: Secondary | ICD-10-CM | POA: Diagnosis present

## 2023-12-05 DIAGNOSIS — E782 Mixed hyperlipidemia: Secondary | ICD-10-CM

## 2023-12-05 DIAGNOSIS — I480 Paroxysmal atrial fibrillation: Secondary | ICD-10-CM

## 2023-12-05 DIAGNOSIS — I10 Essential (primary) hypertension: Secondary | ICD-10-CM | POA: Diagnosis not present

## 2023-12-05 DIAGNOSIS — I5020 Unspecified systolic (congestive) heart failure: Secondary | ICD-10-CM

## 2023-12-05 NOTE — Progress Notes (Signed)
 Clinical Summary Ms. Linders is a 78 y.o.female seen today for follow up of the following medical problems.      1.Afib vs atypical aflutter with variable conduction - new diagnosis during 02/2021 admission - difficult to rate control, had TEE/DCCV initially but quickly had recurrent arrhythmia - started on amio, converted to SR -  TSH was elevated to 28 in 11/2021 which led to dose reduction of Amiodarone  to 100 mg daily. She was also started on Synthroid  by her PCP     - eliquis  costing $150 a month. Turned down for assistance.  No recent palpitations -compliant with meds, no bleeding on eliquis .      2. HFimpEF - new diagnosis during 02/2021 in setting of afib with RVR - 02/2021 echo LVEF 30-35% -likely tachy mediated CM   05/2021 echo: LVEF 55-60% 05/2023 LVEF 60-65%, grade I dd, mild MR, mod TR - - some LE edemea  that has increased. No specific SOb/DOE    - some swelling in feet - flu in January, severe course. Given prednisone  course by pcp. Some increased LE edema since that time, some lingering SOB and fatigue. Last prednisone  was just yesterday     3. Mitral regurgitation - 05/2021 mod MR, mod TR 05/2023 LVEF 60-65%, grade I dd, mild MR, mod TR     4. Hyopothyroidism - issues developed after starting amiodarone  - followed by pcp   5. HLD - 03/2023 TC 168 TG 177 HDL 44 LDL 93 - Jan 2025 TC 146 TG 151 HDL 41 LDL 79    6. HTN - recent course prednsisone.   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    Past Medical History:  Diagnosis Date   (HFpEF) heart failure with preserved ejection fraction ([name])    a. EF previously 30-35% in 02/2021 in the setting of afib w/RVR --> normalized by repeat imaging in 05/2021.   Anxiety    Atrial flutter ([name])    a. s/p DCCV in 02/2021   Atrial septal defect    Surgical repair in 1994   Cardiomyopathy [facility])    Chest pain    Normal coronary angiography in 1994 and 2011   Dizziness    Endometrial  cancer, grade I ([name]) 10/09/2019   Essential thrombocythemia ([name]) 06/06/2016   GERD (gastroesophageal reflux disease)    History of kidney stones    Hyperlipidemia    Hypertension    LBBB (left bundle [place] block) 2011   Mitral regurgitation    Palpitations    Persistent atrial fibrillation ([name])    PONV (postoperative nausea and vomiting)    Syncope    Tricuspid regurgitation    Type 2 diabetes mellitus with other specified complication ([name]) 07/18/2019   Ventricular tachycardia ([name])    Exercise induced     Allergies  Allergen Reactions   Codeine Nausea And Vomiting   Morphine  Other (See Comments)    GI symptoms     Current Outpatient Medications  Medication Sig Dispense Refill   Accu-Chek Softclix Lancets lancets 1 each by Other route daily at 12 noon. Use as instructed 90 each 3   amiodarone  (PACERONE ) 200 MG tablet TAKE 1/2 TABLET BY MOUTH DAILY 45 tablet 3   atorvastatin  (LIPITOR) 40 MG tablet Take 1 tablet (40 mg total) by mouth at bedtime. 90 tablet 3   cephALEXin  (KEFLEX ) 500 MG capsule Take 1 capsule (500 mg total) by mouth 2 (two) times daily for 10 doses. 10 capsule  0   ELIQUIS  5 MG TABS tablet TAKE 1 TABLET BY MOUTH TWICE A DAY 60 tablet 5   ENTRESTO  24-26 MG TAKE 1 TABLET BY MOUTH TWICE A DAY 180 tablet 2   estradiol  (ESTRACE ) 0.1 MG/GM vaginal cream 1/2 inch in applicator per vagina 2 nights a week 42.5 g 3   fenofibrate  160 MG tablet TAKE 1 TABLET BY MOUTH EVERY DAY 90 tablet 1   ferrous sulfate  325 (65 FE) MG EC tablet Take 1 tablet (325 mg total) by mouth daily with breakfast. 100 tablet 3   furosemide  (LASIX ) 20 MG tablet Take 1 tablet (20 mg total) by mouth daily as needed (swelling). 90 tablet 3   glucose blood (ACCU-CHEK GUIDE) test strip 1 each by Other route daily. Use as instructed 90 each 3   hydroxyurea  (HYDREA ) 500 MG capsule Take 1 capsule (500 mg total) by mouth 2 (two) times daily. 180 capsule 3   ketoconazole  (NIZORAL ) 2 % cream Apply 1  application topically 2 (two) times daily. 60 g 0   levothyroxine  (SYNTHROID ) 75 MCG tablet Take 1 tablet (75 mcg total) by mouth daily before breakfast. 90 tablet 3   metFORMIN  (GLUCOPHAGE ) 500 MG tablet Take 1 tablet (500 mg total) by mouth 2 (two) times daily with a meal. 60 tablet 0   methenamine  (HIPREX ) 1 g tablet TAKE 1 TABLET (1 G TOTAL) BY MOUTH 2 (TWO) TIMES DAILY WITH A MEAL. 180 tablet 3   metoprolol  succinate (TOPROL -XL) 50 MG 24 hr tablet Take 1.5 tablets (75 mg total) by mouth daily. Take with or immediately following a meal. 135 tablet 3   Multiple Vitamins-Minerals (MULTIVITAMIN WOMEN 50+) TABS Take by mouth daily.     naproxen sodium (ALEVE) 220 MG tablet Take 220 mg by mouth as needed.     omeprazole  (PRILOSEC) 20 MG capsule TAKE 1 CAPSULE BY MOUTH EVERY DAY 90 capsule 3   predniSONE  (DELTASONE ) 20 MG tablet 2 po at same time daily for 5 days 10 tablet 0   No current facility-administered medications for this visit.     Past Surgical History:  Procedure Laterality Date   APPENDECTOMY     ASD REPAIR  1994   [facility]   BREAST EXCISIONAL BIOPSY     Right and left   BREAST SURGERY     right and left breast-benign   CARDIAC CATHETERIZATION  12/24/2009   Dr. Wonda    CARDIOVERSION N/A 03/04/2021   Procedure: CARDIOVERSION;  Surgeon: Alvan Dorn FALCON, MD;  Location: [facility];  Service: Endoscopy;  Laterality: N/A;   CATARACT EXTRACTION W/PHACO Left 03/11/2013   Procedure: CATARACT EXTRACTION PHACO AND INTRAOCULAR LENS PLACEMENT (IOC);  Surgeon: Oneil T. Roz, MD;  Location: [facility];  Service: Ophthalmology;  Laterality: Left;  CDE:  12.20   HYSTEROSCOPY WITH D & C N/A 11/11/2019   Procedure: DILATATION AND CURETTAGE /HYSTEROSCOPY;  Surgeon: Edsel Norleen GAILS, MD;  Location: [facility];  Service: Gynecology;  Laterality: N/A;   POLYPECTOMY N/A 11/11/2019   Procedure: POLYPECTOMY(ENDOMETRIAL POLYP);  Surgeon: Edsel Norleen GAILS, MD;  Location: [facility];  Service: Gynecology;   Laterality: N/A;   ROBOTIC ASSISTED LAPAROSCOPIC HYSTERECTOMY AND SALPINGECTOMY Bilateral 12/09/2019   Procedure: XI ROBOTIC ASSISTED LAPAROSCOPIC HYSTERECTOMY BILATERAL SALPINGOOOPHORECTOMY;  Surgeon: Eloy Herring, MD;  Location: [facility];  Service: Gynecology;  Laterality: Bilateral;   SENTINEL NODE BIOPSY N/A 12/09/2019   Procedure: SENTINEL LYMPH  NODE BIOPSY;  Surgeon: Eloy Herring, MD;  Location: [facility];  Service:  Gynecology;  Laterality: N/A;   TEE WITHOUT CARDIOVERSION N/A 03/04/2021   Procedure: TRANSESOPHAGEAL ECHOCARDIOGRAM (TEE);  Surgeon: Alvan Dorn FALCON, MD;  Location: [facility];  Service: Endoscopy;  Laterality: N/A;   TONSILLECTOMY     TUBAL LIGATION     Bilateral     Allergies  Allergen Reactions   Codeine Nausea And Vomiting   Morphine  Other (See Comments)    GI symptoms      Family History  Problem Relation Age of Onset   Cancer Mother        female   Heart failure Father    Stroke Sister    Cancer Sister        breast   Coronary artery disease Neg Hx      Social History Ms. Calbert reports that she has never smoked. She has never used smokeless tobacco. Ms. Galla reports no history of alcohol use.      Physical Examination Today's Vitals   12/05/23 1559 12/05/23 1635  BP: (!) 160/82 (!) 160/80  Pulse: 75   SpO2: 95%   Weight: 169 lb 9.6 oz (76.9 kg)   Height: 5' 4 (1.626 m)    Body mass index is 29.11 kg/m.  Gen: resting comfortably, no acute distress HEENT: no scleral icterus, pupils equal round and reactive, no palptable cervical adenopathy,  CV: RRR, no mrg, no jvd Resp: Clear to auscultation bilaterally GI: abdomen is soft, non-tender, non-distended, normal bowel sounds, no hepatosplenomegaly MSK: extremities are warm, 1+ bilateral LE edema Skin: warm, no rash Neuro:  no focal deficits Psych: appropriate affect     Assessment and Plan  1.Paroxsymal afib vs atypical aflutter -no symptoms, continue current meds     2. HFimpEF -  history of tachy mediated CM, LVEF normalized with control of her arrhythmias - some recent LE edema after course of prednisone , discussed more regular use of her prn lasix .    3. HTN - bp elevated today perhaps related to recent prednisone  course. Just last month was 120s/60s - update us  on home bp's next week  4. HLD - at goal, continue current meds    Dorn PHEBE Alvan, M.D.

## 2023-12-05 NOTE — Patient Instructions (Signed)
 Medication Instructions:  Your physician recommends that you continue on your current medications as directed. Please refer to the Current Medication list given to you today.  *If you need a refill on your cardiac medications before your next appointment, please call your pharmacy*   Lab Work: None If you have labs (blood work) drawn today and your tests are completely normal, you will receive your results only by: MyChart Message (if you have MyChart) OR A paper copy in the mail If you have any lab test that is abnormal or we need to change your treatment, we will call you to review the results.   Testing/Procedures: None   Follow-Up: At [facility], you and your health needs are our priority.  As part of our continuing mission to provide you with exceptional heart care, we have created designated Provider Care Teams.  These Care Teams include your primary Cardiologist (physician) and Advanced Practice Providers (APPs -  Physician Assistants and Nurse Practitioners) who all work together to provide you with the care you need, when you need it.  We recommend signing up for the patient portal called "MyChart".  Sign up information is provided on this After Visit Summary.  MyChart is used to connect with patients for Virtual Visits (Telemedicine).  Patients are able to view lab/test results, encounter notes, upcoming appointments, etc.  Non-urgent messages can be sent to your provider as well.   To learn more about what you can do with MyChart, go to [email or web address].    Your next appointment:   3 month(s)  Provider:   You may see Dina Rich, MD or one of the following Advanced Practice Providers on your designated Care Team:   Randall An, PA-C  Jacolyn Reedy, [place]     Other Instructions

## 2023-12-10 ENCOUNTER — Encounter: Payer: Self-pay | Admitting: Family Medicine

## 2023-12-12 ENCOUNTER — Other Ambulatory Visit: Payer: Self-pay | Admitting: Family Medicine

## 2023-12-12 DIAGNOSIS — E1169 Type 2 diabetes mellitus with other specified complication: Secondary | ICD-10-CM

## 2024-01-01 ENCOUNTER — Telehealth: Payer: Self-pay | Admitting: Cardiology

## 2024-01-01 ENCOUNTER — Encounter: Payer: Self-pay | Admitting: Urology

## 2024-01-01 ENCOUNTER — Ambulatory Visit (INDEPENDENT_AMBULATORY_CARE_PROVIDER_SITE_OTHER): Admitting: Urology

## 2024-01-01 VITALS — BP 139/66 | HR 72

## 2024-01-01 DIAGNOSIS — Z87440 Personal history of urinary (tract) infections: Secondary | ICD-10-CM

## 2024-01-01 DIAGNOSIS — Z09 Encounter for follow-up examination after completed treatment for conditions other than malignant neoplasm: Secondary | ICD-10-CM

## 2024-01-01 DIAGNOSIS — Z87898 Personal history of other specified conditions: Secondary | ICD-10-CM

## 2024-01-01 DIAGNOSIS — R3129 Other microscopic hematuria: Secondary | ICD-10-CM

## 2024-01-01 DIAGNOSIS — Z79899 Other long term (current) drug therapy: Secondary | ICD-10-CM

## 2024-01-01 DIAGNOSIS — N3000 Acute cystitis without hematuria: Secondary | ICD-10-CM

## 2024-01-01 NOTE — Telephone Encounter (Signed)
 Spoke with pt who states that she has ongoing SOB. She states that she was told to call back if it was not better. Reports getting SOB when walking or moving around. Denies CP at this time. She does report that her feet and legs are swollen. Current weights are 168 lbs today and 169 on yesterday. Pt reports taking Lasix 20 mg daily. Current blood pressures are as follows 131/59 82, 130/60 77, 149/72 79, 134/72 77. Pt request call back on mobile at [phone]. Please advise.

## 2024-01-01 NOTE — Telephone Encounter (Signed)
 Pt c/o Shortness Of Breath: STAT if SOB developed within the last 24 hours or pt is noticeably SOB on the phone  1. Are you currently SOB (can you hear that pt is SOB on the phone)?  She states a little bit   2. How long have you been experiencing SOB? 1-2 weeks   3. Are you SOB when sitting or when up moving around? Moving around   4. Are you currently experiencing any other symptoms?  Fatigue, feet and legs swelling. She states she is on fluid pill

## 2024-01-01 NOTE — Progress Notes (Addendum)
 History of Present Illness: Heather Barber is a 78 y.o. year old female here for urgent work in.  At last visit she had a negative cystoscopy and negative cytology as part of microscopic hematuria evaluation.  Hematuria CT was negative for GU issues.  She has had recurrent urinary tract infections.  After last visit she was placed on cephalexin, she is now on methenamine hippurate suppression.  Here because of frequency and dysuria, feeling bad.  She states that she has had some gross hematuria.  Past Medical History:  Diagnosis Date   (HFpEF) heart failure with preserved ejection fraction ([name])    a. EF previously 30-35% in 02/2021 in the setting of afib w/RVR --> normalized by repeat imaging in 05/2021.   Anxiety    Atrial flutter ([name])    a. s/p DCCV in 02/2021   Atrial septal defect    Surgical repair in 1994   Cardiomyopathy [facility])    Chest pain    Normal coronary angiography in 1994 and 2011   Dizziness    Endometrial cancer, grade I ([name]) 10/09/2019   Essential thrombocythemia ([name]) 06/06/2016   GERD (gastroesophageal reflux disease)    History of kidney stones    Hyperlipidemia    Hypertension    LBBB (left bundle branch block) 2011   Mitral regurgitation    Palpitations    Persistent atrial fibrillation ([name])    PONV (postoperative nausea and vomiting)    Syncope    Tricuspid regurgitation    Type 2 diabetes mellitus with other specified complication ([name]) 07/18/2019   Ventricular tachycardia ([name])    Exercise induced    Past Surgical History:  Procedure Laterality Date   APPENDECTOMY     ASD REPAIR  1994   [facility]   BREAST EXCISIONAL BIOPSY     Right and left   BREAST SURGERY     right and left breast-benign   CARDIAC CATHETERIZATION  12/24/2009   Dr. Excell Seltzer    CARDIOVERSION N/A 03/04/2021   Procedure: CARDIOVERSION;  Surgeon: Antoine Poche, MD;  Location: [facility];  Service: Endoscopy;  Laterality: N/A;   CATARACT EXTRACTION W/PHACO Left  03/11/2013   Procedure: CATARACT EXTRACTION PHACO AND INTRAOCULAR LENS PLACEMENT (IOC);  Surgeon: Loraine Leriche T. Nile Riggs, MD;  Location: [facility];  Service: Ophthalmology;  Laterality: Left;  CDE:  12.20   HYSTEROSCOPY WITH D & C N/A 11/11/2019   Procedure: DILATATION AND CURETTAGE /HYSTEROSCOPY;  Surgeon: Tilda Burrow, MD;  Location: [facility];  Service: Gynecology;  Laterality: N/A;   POLYPECTOMY N/A 11/11/2019   Procedure: POLYPECTOMY(ENDOMETRIAL POLYP);  Surgeon: Tilda Burrow, MD;  Location: [facility];  Service: Gynecology;  Laterality: N/A;   ROBOTIC ASSISTED LAPAROSCOPIC HYSTERECTOMY AND SALPINGECTOMY Bilateral 12/09/2019   Procedure: XI ROBOTIC ASSISTED LAPAROSCOPIC HYSTERECTOMY BILATERAL SALPINGOOOPHORECTOMY;  Surgeon: Adolphus Birchwood, MD;  Location: [facility];  Service: Gynecology;  Laterality: Bilateral;   SENTINEL NODE BIOPSY N/A 12/09/2019   Procedure: SENTINEL LYMPH  NODE BIOPSY;  Surgeon: Adolphus Birchwood, MD;  Location: [facility];  Service: Gynecology;  Laterality: N/A;   TEE WITHOUT CARDIOVERSION N/A 03/04/2021   Procedure: TRANSESOPHAGEAL ECHOCARDIOGRAM (TEE);  Surgeon: Antoine Poche, MD;  Location: [facility];  Service: Endoscopy;  Laterality: N/A;   TONSILLECTOMY     TUBAL LIGATION     Bilateral    Home Medications:  (Not in a hospital admission)   Allergies:  Allergies  Allergen Reactions   Codeine Nausea And Vomiting   Morphine Other (See Comments)  GI symptoms    Family History  Problem Relation Age of Onset   Cancer Mother        female   Heart failure Father    Stroke Sister    Cancer Sister        breast   Coronary artery disease Neg Hx     Social History:  reports that she has never smoked. She has never used smokeless tobacco. She reports that she does not drink alcohol and does not use drugs.  ROS: A complete review of systems was performed.  All systems are negative except for pertinent findings as noted.  Physical Exam:  Vital signs in last 24  hours: @VSRANGES @ General:  Alert and oriented, No acute distress HEENT: Normocephalic, atraumatic Neck: No JVD or lymphadenopathy Cardiovascular: Regular rate  Lungs: Normal inspiratory/expiratory excursion Extremities: No edema Neurologic: Grossly intact  I have reviewed prior pt notes  I have reviewed prior urinalysis and urine culture results  I have independently reviewed prior imaging--CT results  I have reviewed cytology results  Impression/Assessment:  Microscopic hematuria in the past, noted today with negative CT, cystoscopy, cytology  History of UTI.  Urinalysis today not convincing for this  Plan:  I will set up urine for culture.  No antibiotics sent in yet  Follow-up dependent upon results of above  She was also given a prescription for [name] 01/01/2024, 2:54 PM  Bertram Millard. Lakynn Halvorsen MD

## 2024-01-02 LAB — MICROSCOPIC EXAMINATION: RBC, Urine: >30 /HPF — AB (ref 0–2)

## 2024-01-02 LAB — URINALYSIS, ROUTINE W REFLEX MICROSCOPIC
Bilirubin, UA: NEGATIVE
Glucose, UA: NEGATIVE
Ketones, UA: NEGATIVE
Nitrite, UA: NEGATIVE
Specific Gravity, UA: 1.025 (ref 1.005–1.030)
Urobilinogen, Ur: 1 mg/dL (ref 0.2–1.0)
pH, UA: 6 (ref 5.0–7.5)

## 2024-01-02 MED ORDER — FUROSEMIDE 20 MG PO TABS: ORAL_TABLET | ORAL | 3 refills | Status: DC

## 2024-01-02 MED ORDER — POTASSIUM CHLORIDE CRYS ER 20 MEQ PO TBCR: 40.0000 meq | EXTENDED_RELEASE_TABLET | Freq: Every day | ORAL | 3 refills | Status: DC

## 2024-01-02 NOTE — Telephone Encounter (Signed)
 Left a message for patient to call office regarding medication instructions.

## 2024-01-02 NOTE — Telephone Encounter (Signed)
 Patient notified and verbalized understanding. Patient had no questions or concerns at this time.

## 2024-01-03 LAB — URINE CULTURE

## 2024-01-07 ENCOUNTER — Other Ambulatory Visit (HOSPITAL_COMMUNITY)
Admission: RE | Admit: 2024-01-07 | Discharge: 2024-01-07 | Disposition: A | Discharge status: Home / Self Care | Source: Ambulatory Visit | Attending: Internal Medicine | Admitting: Internal Medicine

## 2024-01-07 DIAGNOSIS — Z79899 Other long term (current) drug therapy: Secondary | ICD-10-CM | POA: Diagnosis present

## 2024-01-07 LAB — BASIC METABOLIC PANEL
Anion gap: 11 (ref 5–15)
BUN: 30 mg/dL — ABNORMAL HIGH (ref 8–23)
CO2: 26 mmol/L (ref 22–32)
Calcium: 9.8 mg/dL (ref 8.9–10.3)
Chloride: 102 mmol/L (ref 98–111)
Creatinine, Ser: 1.15 mg/dL — ABNORMAL HIGH (ref 0.44–1.00)
GFR, Estimated: 49 mL/min — ABNORMAL LOW (ref >60)
Glucose, Bld: 135 mg/dL — ABNORMAL HIGH (ref 70–99)
Potassium: 4.8 mmol/L (ref 3.5–5.1)
Sodium: 139 mmol/L (ref 135–145)

## 2024-01-08 ENCOUNTER — Telehealth: Payer: Self-pay

## 2024-01-08 NOTE — Telephone Encounter (Signed)
 Called to relay message from MD LVM to call back if she's in any pain

## 2024-01-08 NOTE — Telephone Encounter (Signed)
-----   Message from Bertram Millard Dahlstedt sent at 01/08/2024 12:50 PM EDT ----- I cannot see where I forwarded the results to tell the patient.  Let her know that she does not have any bacteria in her urine.  See how she is doing.  If she is still having significant pain in her bladder area, have her scheduled to see me next available opening. ----- Message ----- From: Interface, [facility] Lab Results In Sent: 01/02/2024   5:37 AM EDT To: Marcine Matar, MD

## 2024-01-16 ENCOUNTER — Other Ambulatory Visit: Payer: Medicare Other

## 2024-01-16 ENCOUNTER — Other Ambulatory Visit

## 2024-01-16 DIAGNOSIS — Z1589 Genetic susceptibility to other disease: Secondary | ICD-10-CM

## 2024-01-16 DIAGNOSIS — D473 Essential (hemorrhagic) thrombocythemia: Secondary | ICD-10-CM

## 2024-01-16 DIAGNOSIS — D539 Nutritional anemia, unspecified: Secondary | ICD-10-CM

## 2024-01-16 DIAGNOSIS — E611 Iron deficiency: Secondary | ICD-10-CM

## 2024-01-16 DIAGNOSIS — E538 Deficiency of other specified B group vitamins: Secondary | ICD-10-CM

## 2024-01-17 ENCOUNTER — Other Ambulatory Visit

## 2024-01-17 LAB — CBC WITH DIFFERENTIAL/PLATELET
Basophils Absolute: 0.1 10*3/uL (ref 0.0–0.2)
Basos: 1 %
EOS (ABSOLUTE): 0.1 10*3/uL (ref 0.0–0.4)
Eos: 1 %
Hematocrit: 36.3 % (ref 34.0–46.6)
Hemoglobin: 12.1 g/dL (ref 11.1–15.9)
Immature Grans (Abs): 0 10*3/uL (ref 0.0–0.1)
Immature Granulocytes: 0 %
Lymphocytes Absolute: 2.7 10*3/uL (ref 0.7–3.1)
Lymphs: 39 %
MCH: 33.5 pg — ABNORMAL HIGH (ref 26.6–33.0)
MCHC: 33.3 g/dL (ref 31.5–35.7)
MCV: 101 fL — ABNORMAL HIGH (ref 79–97)
Monocytes Absolute: 0.6 10*3/uL (ref 0.1–0.9)
Monocytes: 8 %
Neutrophils Absolute: 3.6 10*3/uL (ref 1.4–7.0)
Neutrophils: 51 %
Platelets: 413 10*3/uL (ref 150–450)
RBC: 3.61 x10E6/uL — ABNORMAL LOW (ref 3.77–5.28)
RDW: 14.7 % (ref 11.7–15.4)
WBC: 7.1 10*3/uL (ref 3.4–10.8)

## 2024-01-17 LAB — COMPREHENSIVE METABOLIC PANEL
ALT: 10 IU/L (ref 0–32)
AST: 13 IU/L (ref 0–40)
Albumin: 4.5 g/dL (ref 3.8–4.8)
Alkaline Phosphatase: 62 IU/L (ref 44–121)
BUN/Creatinine Ratio: 22 (ref 12–28)
BUN: 26 mg/dL (ref 8–27)
Bilirubin Total: 0.3 mg/dL (ref 0.0–1.2)
CO2: 23 mmol/L (ref 20–29)
Calcium: 10.2 mg/dL (ref 8.7–10.3)
Chloride: 100 mmol/L (ref 96–106)
Creatinine, Ser: 1.16 mg/dL — ABNORMAL HIGH (ref 0.57–1.00)
Globulin, Total: 2.2 g/dL (ref 1.5–4.5)
Glucose: 175 mg/dL — ABNORMAL HIGH (ref 70–99)
Potassium: 5 mmol/L (ref 3.5–5.2)
Sodium: 139 mmol/L (ref 134–144)
Total Protein: 6.7 g/dL (ref 6.0–8.5)
eGFR: 49 mL/min/{1.73_m2} — ABNORMAL LOW (ref >59)

## 2024-01-17 LAB — IRON AND TIBC
Iron Saturation: 23 % (ref 15–55)
Iron: 108 ug/dL (ref 27–139)
Total Iron Binding Capacity: 463 ug/dL — ABNORMAL HIGH (ref 250–450)
UIBC: 355 ug/dL (ref 118–369)

## 2024-01-17 LAB — FERRITIN: Ferritin: 91 ng/mL (ref 15–150)

## 2024-01-17 LAB — LACTATE DEHYDROGENASE: LDH: 182 IU/L (ref 119–226)

## 2024-01-19 ENCOUNTER — Other Ambulatory Visit: Payer: Self-pay | Admitting: Family Medicine

## 2024-01-19 DIAGNOSIS — E1169 Type 2 diabetes mellitus with other specified complication: Secondary | ICD-10-CM

## 2024-01-22 NOTE — Progress Notes (Unsigned)
 [facility] [address], [place] [address]   CLINIC:  Medical Oncology/Hematology  PCP:  Dettinger, Elige Radon, MD [address] [place] [address] [phone]   REASON FOR VISIT:  Follow-up for JAK2 positive essential thrombocytosis   PRIOR THERAPY: None   CURRENT THERAPY: Hydrea 500 mg once daily   INTERVAL HISTORY:   Heather Barber 78 y.o. female returns for routine follow-up of her JAK2 positive essential thrombocytosis.  She was last seen by Rojelio Brenner PA-C on 09/19/2023.  At today's visit, she reports feeling fairly well.  No recent hospitalizations, surgeries, or changes in baseline health status.  ESSENTIAL THROMBOCYTOSIS: She is tolerating Hydrea well at 500 mg daily.   She denies any GI symptoms such as nausea, vomiting, diarrhea.  She has not noted any mouth sores or skin ulcers.   Energy levels are good.  She denies any erythromelalgia, aquagenic pruritus, Raynaud's phenomenon, or vasomotor symptoms.  No interval DVT or PE.  No B symptoms such as fever, chills, night sweats, unintentional weight loss.    IRON DEFICIENCY ANEMIA: She reports some dyspnea on exertion related to her congestive heart failure.  She denies any chest pain, lightheadedness, headaches, or syncopal episodes.  She has not noticed any bleeding such as epistaxis, hematemesis, hematochezia, or melena.  She does continue to have microscopic hematuria as well as occasional gross hematuria, which is thought to be infectious (hemorrhagic cystitis) after having been worked up by urology.  No pelvic pain, B symptoms, or abnormal vaginal bleeding at this time  She has 75% energy and 100% appetite. She endorses that she is maintaining a stable weight.  ASSESSMENT & PLAN:  1.  JAK2 positive essential thrombocytosis - High risk because of her advanced age - Hydroxyurea 500 mg daily, currently tolerating well - No prior history of thrombosis.  She takes Eliquis for Afib. - She does  not report any aquagenic pruritus or vasomotor symptoms.    - No lymphadenopathy or splenomegaly on exam today (01/23/24) - Most recent labs (01/16/2024): Platelets 413.  Hgb 12.1/MCV 101.  Normal WBC and differential.  Baseline CMP, normal LDH. - PLAN: Continue Hydrea 500 mg daily     - RTC 3 months for repeat labs and follow-up.  (Rather than 76-month follow-up, due to platelets being slightly over 400 - will consider dose adjustment if they remain elevated at next visit)   2.  Anemia w/ iron deficiency + B12 deficiency - Labs in November 2022 showed mildly decreased Hgb 11.1, MCV 120.8 (macrocytosis secondary to Hydrea). - She was also noted to have mild iron deficiency (ferritin 56, iron saturation 17%, TIBC 508) and B12 deficiency (147) - Additional labs for work-up of anemia were unremarkable (normal creatinine 0.86, normal folate, copper; SPEP and free light chains unremarkable) - She reports improved energy after starting back on daily B12 and iron supplements - Most recent labs (01/16/2024):  Hgb 12.1/MCV 101 Ferritin 91, iron saturation 23% with elevated TIBC 463 Stool cards are negative for blood x 3 (Labs from November 2024 showed normalized B12/MMA) - No bright blood per rectum or melena.  Intermittent hematuria related to hemorrhagic cystitis. - Last EGD/colonoscopy was "many years ago."  She had negative Cologuard in September 2022. - Some degree of anemia secondary to Hydrea permissive in the setting of cytoreductive therapy for essential thrombocytosis, as well as anemia secondary to iron deficiency and B12 deficiency. - PLAN: Continue same dose of Hydrea as above. - Continue ferrous  sulfate 325 mg daily    - Continue B12 supplement 1000 mcg daily every other day  (Recheck B12/MMA at next visit in about 3 months - June 2025) - CBC and iron panel in 3 months   3.  Stage Ia grade 1 endometrioid adenocarcinoma - Status post robotic staging on 12/09/2019. - MMR normal. - Due to low  risk of recurrence, no adjuvant therapy was recommended. - Seen by GYN in June 2023 due to isolated episode of vaginal bleeding, no concerns noted per GYN note - No pelvic pain, B symptoms, or abnormal vaginal bleeding at this time     - PLAN: Continue follow-up with GYN every 6 months for 5 years.   PLAN SUMMARY: >> Labs in 3 months at [facility] (via [facility]) = CBC/D, CMP, LDH, ferritin, iron/TIBC, B12, MMA >> OFFICE visit in 3 months (1 week after labs)     REVIEW OF SYSTEMS:   Review of Systems  Constitutional:  Negative for appetite change, chills, diaphoresis, fatigue, fever and unexpected weight change.  HENT:   Negative for lump/mass and nosebleeds.   Eyes:  Negative for eye problems.  Respiratory:  Positive for shortness of breath. Negative for cough and hemoptysis.   Cardiovascular:  Positive for palpitations. Negative for chest pain and leg swelling.  Gastrointestinal:  Positive for nausea. Negative for abdominal pain, blood in stool, constipation, diarrhea and vomiting.  Genitourinary:  Positive for hematuria. Negative for dysuria.   Skin: Negative.   Neurological:  Negative for dizziness, headaches and light-headedness.  Hematological:  Does not bruise/bleed easily.     PHYSICAL EXAM:  ECOG PERFORMANCE STATUS: 0 - Asymptomatic  Vitals:   01/23/24 1301  BP: 139/65  Pulse: 72  Resp: 18  Temp: 98.2 F (36.8 C)  SpO2: 97%    Filed Weights   01/23/24 1301  Weight: 166 lb 3.6 oz (75.4 kg)    Physical Exam Constitutional:      Appearance: Normal appearance. She is obese.  Cardiovascular:     Heart sounds: Normal heart sounds.  Pulmonary:     Breath sounds: Normal breath sounds.  Neurological:     General: No focal deficit present.     Mental Status: Mental status is at baseline.  Psychiatric:        Behavior: Behavior normal. Behavior is cooperative.    PAST MEDICAL/SURGICAL HISTORY:  Past Medical History:  Diagnosis Date   (HFpEF) heart failure with  preserved ejection fraction ([name])    a. EF previously 30-35% in 02/2021 in the setting of afib w/RVR --> normalized by repeat imaging in 05/2021.   Anxiety    Atrial flutter ([name])    a. s/p DCCV in 02/2021   Atrial septal defect    Surgical repair in 1994   Cardiomyopathy [facility])    Chest pain    Normal coronary angiography in 1994 and 2011   Dizziness    Endometrial cancer, grade I ([name]) 10/09/2019   Essential thrombocythemia ([name]) 06/06/2016   GERD (gastroesophageal reflux disease)    History of kidney stones    Hyperlipidemia    Hypertension    LBBB (left bundle branch block) 2011   Mitral regurgitation    Palpitations    Persistent atrial fibrillation ([name])    PONV (postoperative nausea and vomiting)    Syncope    Tricuspid regurgitation    Type 2 diabetes mellitus with other specified complication ([name]) 07/18/2019   Ventricular tachycardia ([name])    Exercise induced   Past Surgical  History:  Procedure Laterality Date   APPENDECTOMY     ASD REPAIR  1994   [facility]   BREAST EXCISIONAL BIOPSY     Right and left   BREAST SURGERY     right and left breast-benign   CARDIAC CATHETERIZATION  12/24/2009   Dr. Excell Seltzer    CARDIOVERSION N/A 03/04/2021   Procedure: CARDIOVERSION;  Surgeon: Antoine Poche, MD;  Location: [facility];  Service: Endoscopy;  Laterality: N/A;   CATARACT EXTRACTION W/PHACO Left 03/11/2013   Procedure: CATARACT EXTRACTION PHACO AND INTRAOCULAR LENS PLACEMENT (IOC);  Surgeon: Loraine Leriche T. Nile Riggs, MD;  Location: [facility];  Service: Ophthalmology;  Laterality: Left;  CDE:  12.20   HYSTEROSCOPY WITH D & C N/A 11/11/2019   Procedure: DILATATION AND CURETTAGE /HYSTEROSCOPY;  Surgeon: Tilda Burrow, MD;  Location: [facility];  Service: Gynecology;  Laterality: N/A;   POLYPECTOMY N/A 11/11/2019   Procedure: POLYPECTOMY(ENDOMETRIAL POLYP);  Surgeon: Tilda Burrow, MD;  Location: [facility];  Service: Gynecology;  Laterality: N/A;   ROBOTIC ASSISTED LAPAROSCOPIC  HYSTERECTOMY AND SALPINGECTOMY Bilateral 12/09/2019   Procedure: XI ROBOTIC ASSISTED LAPAROSCOPIC HYSTERECTOMY BILATERAL SALPINGOOOPHORECTOMY;  Surgeon: Adolphus Birchwood, MD;  Location: [facility];  Service: Gynecology;  Laterality: Bilateral;   SENTINEL NODE BIOPSY N/A 12/09/2019   Procedure: SENTINEL LYMPH  NODE BIOPSY;  Surgeon: Adolphus Birchwood, MD;  Location: [facility];  Service: Gynecology;  Laterality: N/A;   TEE WITHOUT CARDIOVERSION N/A 03/04/2021   Procedure: TRANSESOPHAGEAL ECHOCARDIOGRAM (TEE);  Surgeon: Antoine Poche, MD;  Location: [facility];  Service: Endoscopy;  Laterality: N/A;   TONSILLECTOMY     TUBAL LIGATION     Bilateral    SOCIAL HISTORY:  Social History   Socioeconomic History   Marital status: Widowed    Spouse name: widowed   Number of children: 3   Years of education: Not on file   Highest education level: Not on file  Occupational History   Occupation: Production designer, theatre/television/film shop  Tobacco Use   Smoking status: Never   Smokeless tobacco: Never  Vaping Use   Vaping status: Never Used  Substance and Sexual Activity   Alcohol use: No   Drug use: No   Sexual activity: Not Currently    Birth control/protection: Post-menopausal, Surgical    Comment: hyst  Other Topics Concern   Not on file  Social History Narrative   Married and resides in [place]. 3 children. 3 grandchildren.   Sedentary lifestyle   Social Drivers of Corporate investment banker Strain: Low Risk  (03/29/2023)   Overall Financial Resource Strain (CARDIA)    Difficulty of Paying Living Expenses: Not hard at all  Food Insecurity: No Food Insecurity (03/29/2023)   Hunger Vital Sign    Worried About Running Out of Food in the Last Year: Never true    Ran Out of Food in the Last Year: Never true  Transportation Needs: No Transportation Needs (03/29/2023)   PRAPARE - Administrator, Civil Service (Medical): No    Lack of Transportation (Non-Medical): No  Physical Activity: Insufficiently Active  (03/29/2023)   Exercise Vital Sign    Days of Exercise per Week: 1 day    Minutes of Exercise per Session: 10 min  Stress: No Stress Concern Present (03/29/2023)   [employer] of Occupational Health - Occupational Stress Questionnaire    Feeling of Stress : Not at all  Social Connections: Moderately Isolated (03/29/2023)   Social Connection and Isolation Panel [NHANES]  Frequency of Communication with Friends and Family: Three times a week    Frequency of Social Gatherings with Friends and Family: Once a week    Attends Religious Services: More than 4 times per year    Active Member of [employer] or Organizations: No    Attends Banker Meetings: Never    Marital Status: Widowed  Intimate Partner Violence: Not At Risk (03/29/2023)   Humiliation, Afraid, Rape, and Kick questionnaire    Fear of Current or Ex-Partner: No    Emotionally Abused: No    Physically Abused: No    Sexually Abused: No    FAMILY HISTORY:  Family History  Problem Relation Age of Onset   Cancer Mother        female   Heart failure Father    Stroke Sister    Cancer Sister        breast   Coronary artery disease Neg Hx     CURRENT MEDICATIONS:  Outpatient Encounter Medications as of 01/23/2024  Medication Sig   Accu-Chek Softclix Lancets lancets 1 each by Other route daily at 12 noon. Use as instructed   amiodarone (PACERONE) 200 MG tablet TAKE 1/2 TABLET BY MOUTH DAILY   atorvastatin (LIPITOR) 40 MG tablet Take 1 tablet (40 mg total) by mouth at bedtime.   [employer] VITAMIN B12 1000 MCG tablet Take 1,000 mcg by mouth daily.   ELIQUIS 5 MG TABS tablet TAKE 1 TABLET BY MOUTH TWICE A DAY   ENTRESTO 24-26 MG TAKE 1 TABLET BY MOUTH TWICE A DAY   estradiol (ESTRACE) 0.1 MG/GM vaginal cream 1/2 inch in applicator per vagina 2 nights a week   fenofibrate 160 MG tablet TAKE 1 TABLET BY MOUTH EVERY DAY   ferrous sulfate 325 (65 FE) MG EC tablet Take 1 tablet (325 mg total) by mouth daily with breakfast.    flavoxATE (URISPAS) 100 MG tablet Take 100 mg by mouth every 6 (six) hours as needed.   furosemide (LASIX) 20 MG tablet Take 2 tablets (40 mg total) by mouth in the morning and at bedtime for 3 days, THEN 2 tablets (40 mg total) daily.   glucose blood (ACCU-CHEK GUIDE) test strip 1 each by Other route daily. Use as instructed   hydroxyurea (HYDREA) 500 MG capsule Take 1 capsule (500 mg total) by mouth 2 (two) times daily.   ketoconazole (NIZORAL) 2 % cream Apply 1 application topically 2 (two) times daily.   levothyroxine (SYNTHROID) 75 MCG tablet Take 1 tablet (75 mcg total) by mouth daily before breakfast.   metFORMIN (GLUCOPHAGE) 500 MG tablet TAKE 1 TABLET BY MOUTH TWICE A DAY WITH FOOD   methenamine (HIPREX) 1 g tablet TAKE 1 TABLET (1 G TOTAL) BY MOUTH 2 (TWO) TIMES DAILY WITH A MEAL.   Multiple Vitamins-Minerals (MULTIVITAMIN WOMEN 50+) TABS Take by mouth daily.   naproxen sodium (ALEVE) 220 MG tablet Take 220 mg by mouth as needed.   omeprazole (PRILOSEC) 20 MG capsule TAKE 1 CAPSULE BY MOUTH EVERY DAY   potassium chloride SA (KLOR-CON M) 20 MEQ tablet Take 2 tablets (40 mEq total) by mouth daily.   predniSONE (DELTASONE) 20 MG tablet 2 po at same time daily for 5 days   metoprolol succinate (TOPROL-XL) 50 MG 24 hr tablet Take 1.5 tablets (75 mg total) by mouth daily. Take with or immediately following a meal.   No facility-administered encounter medications on file as of 01/23/2024.    ALLERGIES:  Allergies  Allergen Reactions  Codeine Nausea And Vomiting   Morphine Other (See Comments)    GI symptoms    LABORATORY DATA:  I have reviewed the labs as listed.  CBC    Component Value Date/Time   WBC 7.1 01/16/2024 1557   WBC 6.7 06/01/2023 1222   RBC 3.61 (L) 01/16/2024 1557   RBC 3.15 (L) 06/01/2023 1222   HGB 12.1 01/16/2024 1557   HCT 36.3 01/16/2024 1557   PLT 413 01/16/2024 1557   MCV 101 (H) 01/16/2024 1557   MCH 33.5 (H) 01/16/2024 1557   MCH 36.2 (H) 06/01/2023  1222   MCHC 33.3 01/16/2024 1557   MCHC 33.5 06/01/2023 1222   RDW 14.7 01/16/2024 1557   LYMPHSABS 2.7 01/16/2024 1557   MONOABS 0.5 06/01/2023 1222   EOSABS 0.1 01/16/2024 1557   BASOSABS 0.1 01/16/2024 1557      Latest Ref Rng & Units 01/16/2024    3:57 PM 01/07/2024    3:56 PM 11/26/2023    3:20 PM  CMP  Glucose 70 - 99 mg/dL 469  629  528   BUN 8 - 27 mg/dL 26  30  22    Creatinine 0.57 - 1.00 mg/dL 4.13  2.44  0.10   Sodium 134 - 144 mmol/L 139  139  144   Potassium 3.5 - 5.2 mmol/L 5.0  4.8  5.6   Chloride 96 - 106 mmol/L 100  102  105   CO2 20 - 29 mmol/L 23  26  18    Calcium 8.7 - 10.3 mg/dL 27.2  9.8  9.2   Total Protein 6.0 - 8.5 g/dL 6.7   6.5   Total Bilirubin 0.0 - 1.2 mg/dL 0.3   0.3   Alkaline Phos 44 - 121 IU/L 62   59   AST 0 - 40 IU/L 13   9   ALT 0 - 32 IU/L 10   10     DIAGNOSTIC IMAGING:  I have independently reviewed the relevant imaging and discussed with the patient.   WRAP UP:  All questions were answered. The patient knows to call the clinic with any problems, questions or concerns.  Medical decision making: Moderate  Time spent on visit: I spent 20 minutes counseling the patient face to face. The total time spent in the appointment was 30 minutes and more than 50% was on counseling.  Carnella Guadalajara, PA-C  01/23/24 1:29 PM

## 2024-01-23 ENCOUNTER — Other Ambulatory Visit: Payer: Self-pay

## 2024-01-23 ENCOUNTER — Inpatient Hospital Stay: Payer: Medicare Other | Attending: Physician Assistant | Admitting: Physician Assistant

## 2024-01-23 ENCOUNTER — Other Ambulatory Visit: Payer: Self-pay | Admitting: Physician Assistant

## 2024-01-23 VITALS — BP 139/65 | HR 72 | Temp 98.2°F | Resp 18 | Wt 166.2 lb

## 2024-01-23 DIAGNOSIS — Z7901 Long term (current) use of anticoagulants: Secondary | ICD-10-CM | POA: Diagnosis not present

## 2024-01-23 DIAGNOSIS — E611 Iron deficiency: Secondary | ICD-10-CM

## 2024-01-23 DIAGNOSIS — D509 Iron deficiency anemia, unspecified: Secondary | ICD-10-CM | POA: Diagnosis not present

## 2024-01-23 DIAGNOSIS — D473 Essential (hemorrhagic) thrombocythemia: Secondary | ICD-10-CM | POA: Diagnosis present

## 2024-01-23 DIAGNOSIS — D75839 Thrombocytosis, unspecified: Secondary | ICD-10-CM | POA: Insufficient documentation

## 2024-01-23 DIAGNOSIS — Z1589 Genetic susceptibility to other disease: Secondary | ICD-10-CM | POA: Diagnosis not present

## 2024-01-23 DIAGNOSIS — E538 Deficiency of other specified B group vitamins: Secondary | ICD-10-CM

## 2024-01-23 LAB — OCCULT BLOOD X 1 CARD TO LAB, STOOL
Fecal Occult Bld: NEGATIVE
Fecal Occult Bld: NEGATIVE
Fecal Occult Bld: NEGATIVE

## 2024-01-23 MED ORDER — HYDROXYUREA 500 MG PO CAPS: 500.0000 mg | ORAL_CAPSULE | Freq: Every day | ORAL | 3 refills | Status: DC

## 2024-01-23 NOTE — Patient Instructions (Signed)
 [facility] at [facility] Discharge Instructions  You were seen today by Rojelio Brenner PA-C for your elevated platelets and mild anemia.  Your platelet levels look good, but are mildly higher than we would like them to be.   Continue hydroxyurea (Hydrea) to 500 mg (1 tablet) once per day.   We will recheck your levels again in 3 months.  LABS: Return in 3 months for repeat labs  MEDICATIONS: - Continue Hydrea to 500 mg (1 tablet) once daily - CONTINUE taking ferrous sulfate (iron supplement) 325 mg daily.   - DECREASE your vitamin B12 (cyanocobalamin) 1,000 mcg (1 mg) to take this EVERY OTHER DAY (instead of daily).  FOLLOW-UP APPOINTMENT: Office visit in 3 months with labs the week before   Thank you for choosing [facility] at [facility] to provide your oncology and hematology care.  To afford each patient quality time with our provider, please arrive at least 15 minutes before your scheduled appointment time.   If you have a lab appointment with the Cancer Center please come in thru the Main Entrance and check in at the main information desk.  You need to re-schedule your appointment should you arrive 10 or more minutes late.  We strive to give you quality time with our providers, and arriving late affects you and other patients whose appointments are after yours.  Also, if you no show three or more times for appointments you may be dismissed from the clinic at the providers discretion.     Again, thank you for choosing [facility].  Our hope is that these requests will decrease the amount of time that you wait before being seen by our physicians.       _____________________________________________________________  Should you have questions after your visit to [facility], please contact our office at [phone] and follow the prompts.  Our office hours are 8:00 a.m. and 4:30 p.m. Monday - Friday.  Please  note that voicemails left after 4:00 p.m. may not be returned until the following business day.  We are closed weekends and major holidays.  You do have access to a nurse 24-7, just call the main number to the clinic [phone] and do not press any options, hold on the line and a nurse will answer the phone.    For prescription refill requests, have your pharmacy contact our office and allow 72 hours.    Due to Covid, you will need to wear a mask upon entering the hospital. If you do not have a mask, a mask will be given to you at the Main Entrance upon arrival. For doctor visits, patients may have 1 support person age 101 or older with them. For treatment visits, patients can not have anyone with them due to social distancing guidelines and our immunocompromised population.

## 2024-02-19 ENCOUNTER — Other Ambulatory Visit: Payer: Self-pay | Admitting: Family Medicine

## 2024-02-27 ENCOUNTER — Other Ambulatory Visit: Payer: Self-pay | Admitting: Cardiology

## 2024-02-27 ENCOUNTER — Other Ambulatory Visit: Payer: Self-pay | Admitting: Urology

## 2024-02-27 ENCOUNTER — Other Ambulatory Visit: Payer: Self-pay | Admitting: Family Medicine

## 2024-02-27 DIAGNOSIS — N952 Postmenopausal atrophic vaginitis: Secondary | ICD-10-CM

## 2024-02-27 DIAGNOSIS — E1169 Type 2 diabetes mellitus with other specified complication: Secondary | ICD-10-CM

## 2024-02-28 ENCOUNTER — Telehealth: Payer: Self-pay | Admitting: Family Medicine

## 2024-02-28 ENCOUNTER — Ambulatory Visit (INDEPENDENT_AMBULATORY_CARE_PROVIDER_SITE_OTHER): Payer: Medicare Other | Admitting: Family Medicine

## 2024-02-28 ENCOUNTER — Encounter: Payer: Self-pay | Admitting: Family Medicine

## 2024-02-28 VITALS — BP 129/79 | HR 80 | Ht 64.0 in | Wt 166.0 lb

## 2024-02-28 DIAGNOSIS — E781 Pure hyperglyceridemia: Secondary | ICD-10-CM

## 2024-02-28 DIAGNOSIS — E1169 Type 2 diabetes mellitus with other specified complication: Secondary | ICD-10-CM

## 2024-02-28 DIAGNOSIS — Z7984 Long term (current) use of oral hypoglycemic drugs: Secondary | ICD-10-CM

## 2024-02-28 DIAGNOSIS — E039 Hypothyroidism, unspecified: Secondary | ICD-10-CM | POA: Diagnosis not present

## 2024-02-28 DIAGNOSIS — I10 Essential (primary) hypertension: Secondary | ICD-10-CM

## 2024-02-28 DIAGNOSIS — E782 Mixed hyperlipidemia: Secondary | ICD-10-CM

## 2024-02-28 LAB — BAYER DCA HB A1C WAIVED: HB A1C (BAYER DCA - WAIVED): 7 % — ABNORMAL HIGH (ref 4.8–5.6)

## 2024-02-28 NOTE — Progress Notes (Signed)
 BP 129/79   Pulse 80   Ht 5\' 4"  (1.626 m)   Wt 166 lb (75.3 kg)   SpO2 93%   BMI 28.49 kg/m    Subjective:   [name], female    DOB: 1946/01/19, 78 y.o.   MRN: [id number]  HPI: Heather Barber is a 78 y.o. female presenting on 02/28/2024 for Medical Management of Chronic Issues and Diabetes   HPI Type 2 diabetes mellitus Patient comes in today for recheck of his diabetes. Patient has been currently taking metformin . Patient is currently on an ACE inhibitor/ARB. Patient has seen an ophthalmologist this year. Patient denies any new issues with their feet. The symptom started onset as an adult hypertension and hyperlipidemia and hypertriglyceridemia ARE RELATED TO DM   Hypertension Patient is currently on metoprolol  and Entresto  and amiodarone  and furosemide , and their blood pressure today is 129/79. Patient denies any lightheadedness or dizziness. Patient denies headaches, blurred vision, chest pains, shortness of breath, or weakness. Denies any side effects from medication and is content with current medication.   Hyperlipidemia and hypertriglyceridemia Patient is coming in for recheck of his hyperlipidemia. The patient is currently taking fenofibrate  and atorvastatin . They deny any issues with myalgias or history of liver damage from it. They deny any focal numbness or weakness or chest pain.   Hypothyroidism recheck Patient is coming in for thyroid  recheck today as well. They deny any issues with hair changes or heat or cold problems or diarrhea or constipation. They deny any chest pain or palpitations. They are currently on levothyroxine  75 micrograms   Relevant past medical, surgical, family and social history reviewed and updated as indicated. Interim medical history since our last visit reviewed. Allergies and medications reviewed and updated.  Review of Systems  Constitutional:  Negative for chills and fever.  HENT:  Negative for congestion, ear discharge and  ear pain.   Eyes:  Negative for redness and visual disturbance.  Respiratory:  Negative for chest tightness and shortness of breath.   Cardiovascular:  Positive for leg swelling. Negative for chest pain.  Genitourinary:  Negative for difficulty urinating and dysuria.  Musculoskeletal:  Negative for back pain and gait problem.  Skin:  Negative for rash.  Neurological:  Negative for light-headedness and headaches.  Psychiatric/Behavioral:  Negative for agitation and behavioral problems.   All other systems reviewed and are negative.   Per HPI unless specifically indicated above   Allergies as of 02/28/2024       Reactions   Codeine Nausea And Vomiting   Morphine  Other (See Comments)   GI symptoms        Medication List        Accurate as of Feb 28, 2024  4:05 PM. If you have any questions, ask your nurse or doctor.          STOP taking these medications    predniSONE  20 MG tablet Commonly known as: DELTASONE  Stopped by: Lucio Sabin Jamiria Langill       TAKE these medications    Accu-Chek Guide test strip Generic drug: glucose blood 1 each by Other route daily. Use as instructed   Accu-Chek Softclix Lancets lancets 1 each by Other route daily at 12 noon. Use as instructed   amiodarone  200 MG tablet Commonly known as: PACERONE  TAKE 1/2 TABLET BY MOUTH DAILY   atorvastatin  40 MG tablet Commonly known as: LIPITOR Take 1 tablet (40 mg total) by mouth at bedtime.   [employer] VITAMIN B12  1000 MCG tablet Generic drug: cyanocobalamin  Take 1,000 mcg by mouth daily.   Eliquis  5 MG Tabs tablet Generic drug: apixaban  TAKE 1 TABLET BY MOUTH TWICE A DAY   Entresto  24-26 MG Generic drug: sacubitril -valsartan  TAKE 1 TABLET BY MOUTH TWICE A DAY   estradiol  0.1 MG/GM vaginal cream Commonly known as: ESTRACE  1/2 inch in applicator per vagina 2 nights a week   fenofibrate  160 MG tablet TAKE 1 TABLET BY MOUTH EVERY DAY   ferrous sulfate  325 (65 FE) MG EC tablet Take 1 tablet  (325 mg total) by mouth daily with breakfast.   flavoxATE 100 MG tablet Commonly known as: URISPAS Take 100 mg by mouth every 6 (six) hours as needed.   furosemide  20 MG tablet Commonly known as: LASIX  Take 2 tablets (40 mg total) by mouth in the morning and at bedtime for 3 days, THEN 2 tablets (40 mg total) daily. Start taking on: January 02, 2024   hydroxyurea  500 MG capsule Commonly known as: HYDREA  Take 1 capsule (500 mg total) by mouth daily.   ketoconazole  2 % cream Commonly known as: NIZORAL  Apply 1 application topically 2 (two) times daily.   levothyroxine  75 MCG tablet Commonly known as: SYNTHROID  TAKE 1 TABLET BY MOUTH DAILY BEFORE BREAKFAST.   metFORMIN  500 MG tablet Commonly known as: GLUCOPHAGE  TAKE 1 TABLET BY MOUTH TWICE A DAY WITH FOOD   methenamine  1 g tablet Commonly known as: HIPREX  TAKE 1 TABLET (1 G TOTAL) BY MOUTH 2 (TWO) TIMES DAILY WITH A MEAL.   metoprolol  succinate 50 MG 24 hr tablet Commonly known as: TOPROL -XL Take 1.5 tablets (75 mg total) by mouth daily. Take with or immediately following a meal.   Multivitamin Women 50+ Tabs Take by mouth daily.   naproxen sodium 220 MG tablet Commonly known as: ALEVE Take 220 mg by mouth as needed.   omeprazole  20 MG capsule Commonly known as: PRILOSEC TAKE 1 CAPSULE BY MOUTH EVERY DAY   potassium chloride  SA 20 MEQ tablet Commonly known as: KLOR-CON  M Take 2 tablets (40 mEq total) by mouth daily.         Objective:   BP 129/79   Pulse 80   Ht 5\' 4"  (1.626 m)   Wt 166 lb (75.3 kg)   SpO2 93%   BMI 28.49 kg/m   Wt Readings from Last 3 Encounters:  02/28/24 166 lb (75.3 kg)  01/23/24 166 lb 3.6 oz (75.4 kg)  12/05/23 169 lb 9.6 oz (76.9 kg)    Physical Exam Vitals and nursing note reviewed.  Constitutional:      General: She is not in acute distress.    Appearance: She is well-developed. She is not diaphoretic.  Eyes:     Conjunctiva/sclera: Conjunctivae normal.     Pupils: Pupils  are equal, round, and reactive to light.  Cardiovascular:     Rate and Rhythm: Normal rate and regular rhythm.     Heart sounds: Normal heart sounds. No murmur heard. Pulmonary:     Effort: Pulmonary effort is normal. No respiratory distress.     Breath sounds: Normal breath sounds. No wheezing.  Musculoskeletal:        General: Swelling (1+ bilateral lower extremity edema) present. No tenderness. Normal range of motion.  Skin:    General: Skin is warm and dry.     Findings: No rash.  Neurological:     Mental Status: She is alert and oriented to person, place, and time.  Coordination: Coordination normal.  Psychiatric:        Behavior: Behavior normal.       Assessment & Plan:   Problem List Items Addressed This Visit       Cardiovascular and Mediastinum   Hypertension   Relevant Orders   Bayer DCA Hb A1c Waived   Lipid panel   TSH     Endocrine   Type 2 diabetes mellitus with other specified complication ([name]) - Primary   Relevant Orders   Bayer DCA Hb A1c Waived   Lipid panel   TSH   Microalbumin/Creatinine Ratio, Urine   Hypothyroidism   Relevant Orders   Bayer DCA Hb A1c Waived   Lipid panel   TSH     Other   Hyperlipidemia   Relevant Orders   Bayer DCA Hb A1c Waived   Lipid panel   TSH   Hypertriglyceridemia    A1c looks slightly better at 7.0.  Blood pressure and everything else looks good.  She seems to be doing well.  No changes Follow up plan: Return in about 3 months (around 05/30/2024), or if symptoms worsen or fail to improve, for diabetes and htn and hypothyroidism.  Counseling provided for all of the vaccine components Orders Placed This Encounter  Procedures   Bayer DCA Hb A1c Waived   Lipid panel   TSH   Microalbumin/Creatinine Ratio, Urine    Jolyne Needs, MD [facility] 02/28/2024, 4:05 PM

## 2024-02-29 LAB — TSH: TSH: 2.88 u[IU]/mL (ref 0.450–4.500)

## 2024-02-29 LAB — LIPID PANEL
Chol/HDL Ratio: 4.8 ratio — ABNORMAL HIGH (ref 0.0–4.4)
Cholesterol, Total: 200 mg/dL — ABNORMAL HIGH (ref 100–199)
HDL: 42 mg/dL (ref >39)
LDL Chol Calc (NIH): 94 mg/dL (ref 0–99)
Triglycerides: 385 mg/dL — ABNORMAL HIGH (ref 0–149)
VLDL Cholesterol Cal: 64 mg/dL — ABNORMAL HIGH (ref 5–40)

## 2024-02-29 LAB — MICROALBUMIN / CREATININE URINE RATIO
Creatinine, Urine: 20 mg/dL
Microalb/Creat Ratio: 32 mg/g{creat} — ABNORMAL HIGH (ref 0–29)
Microalbumin, Urine: 6.3 ug/mL

## 2024-03-05 ENCOUNTER — Ambulatory Visit: Payer: Medicare Other | Attending: Student | Admitting: Physician Assistant

## 2024-03-05 ENCOUNTER — Encounter: Payer: Self-pay | Admitting: Family Medicine

## 2024-03-05 ENCOUNTER — Encounter: Payer: Self-pay | Admitting: Physician Assistant

## 2024-03-05 VITALS — BP 116/64 | HR 71 | Ht 64.0 in | Wt 163.0 lb

## 2024-03-05 DIAGNOSIS — I4892 Unspecified atrial flutter: Secondary | ICD-10-CM

## 2024-03-05 DIAGNOSIS — I340 Nonrheumatic mitral (valve) insufficiency: Secondary | ICD-10-CM

## 2024-03-05 DIAGNOSIS — E782 Mixed hyperlipidemia: Secondary | ICD-10-CM | POA: Diagnosis present

## 2024-03-05 DIAGNOSIS — Z79899 Other long term (current) drug therapy: Secondary | ICD-10-CM

## 2024-03-05 DIAGNOSIS — I10 Essential (primary) hypertension: Secondary | ICD-10-CM

## 2024-03-05 DIAGNOSIS — I5020 Unspecified systolic (congestive) heart failure: Secondary | ICD-10-CM

## 2024-03-05 DIAGNOSIS — I5032 Chronic diastolic (congestive) heart failure: Secondary | ICD-10-CM | POA: Insufficient documentation

## 2024-03-05 DIAGNOSIS — I480 Paroxysmal atrial fibrillation: Secondary | ICD-10-CM

## 2024-03-05 MED ORDER — FUROSEMIDE 20 MG PO TABS: ORAL_TABLET | ORAL | 11 refills | Status: DC

## 2024-03-05 NOTE — Patient Instructions (Signed)
 Medication Instructions:   Increase Lasix  to 20 mg daily alternating with 40 mg daily  If you are not better in 1-2 weeks may increase to 40 mg daily   *If you need a refill on your cardiac medications before your next appointment, please call your pharmacy*  Lab Work: Your physician recommends that you return for lab work in: 2 weeks ( BMET) 03/20/24.   If you have labs (blood work) drawn today and your tests are completely normal, you will receive your results only by: MyChart Message (if you have MyChart) OR A paper copy in the mail If you have any lab test that is abnormal or we need to change your treatment, we will call you to review the results.  Testing/Procedures: NONE   Follow-Up: At [facility], you and your health needs are our priority.  As part of our continuing mission to provide you with exceptional heart care, our providers are all part of one team.  This team includes your primary Cardiologist (physician) and Advanced Practice Providers or APPs (Physician Assistants and Nurse Practitioners) who all work together to provide you with the care you need, when you need it.  Your next appointment:   3 -4 month(s)  Provider:   You may see Armida Lander, MD or one of the following Advanced Practice Providers on your designated Care Team:   Woodfin Hays, PA-C  Scotesia McCamey, [place] Theotis Flake, [place]     We recommend signing up for the patient portal called "MyChart".  Sign up information is provided on this After Visit Summary.  MyChart is used to connect with patients for Virtual Visits (Telemedicine).  Patients are able to view lab/test results, encounter notes, upcoming appointments, etc.  Non-urgent messages can be sent to your provider as well.   To learn more about what you can do with MyChart, go to [email or web address].   Other Instructions Thank you for choosing [facility]!

## 2024-03-05 NOTE — Progress Notes (Signed)
 Cardiology Office Note:  .   Date:  03/05/2024  ID:  Heather Barber, DOB 1946-02-27, MRN [id number] PCP: Dettinger, Lucio Sabin, MD  [facility] Providers Cardiologist:  Armida Lander, MD {  History of Present Illness: .   Heather Barber is a 78 y.o. female with hx of Afib/atypical aflutter with variable conduction, HFimpEF (02/2021: 30-35%, 05/2021: 55-60%, most recent 05/2023: 60-65%), MR/TR (05/2023: mild MR, mod TR), HLD, HTN, hypothyroidism who present for 3 m/o f/u.   Last seen in heartcare OV 12/05/2023 with Dr. Amanda Jungling. At that time noted, some LE edema, lingering SOB and fatigue. This was contributed to recent prednisone  used which was d/c that day before this visit. No medication changes. Continued on Amio 100 mg daily, Lipitor 40 mg, Fenofibrate  160 mg, Eliquis  5 mg BID, Furosemide  20 mg PRN, Toprol  XL 75 mg, Entresto  24-26 mg BID. Telephone call 01/01/2024 noted patient concerned for SOB and edema. Dr. Amanda Jungling recommended Lasix  40 mg BID in am and pm x 3 days then followed up with 40 mg daily.   Today, reported taking Lasix  20 mg daily since 12/2023. Noted improvement in SOB and edema. Also noted weight loss (12/2023 169 vs 163 today). Denied chest pain, shortness of breath, palpitations, syncope, presyncope, dizziness, orthopnea, PND, acute bleeding, or claudication.  Drinking 4 bottle 12 oz of water , 2 cups of tea, 12 oz diet soda. Endorses healthy diet. Reported walking a mile daily x 2-3 per week.   Studies Reviewed: Aaron Aas       ECHO 05/2023 IMPRESSIONS   1. Left ventricular ejection fraction, by estimation, is 60 to 65%. The  left ventricle has normal function. The left ventricle has no regional  wall motion abnormalities. There is mild left ventricular hypertrophy.  Left ventricular diastolic parameters  are consistent with Grade I diastolic dysfunction (impaired relaxation).   2. Right ventricular systolic function is normal. The right ventricular  size is normal. There is mildly  elevated pulmonary artery systolic  pressure.   3. Left atrial size was moderately dilated.   4. Right atrial size was mildly dilated.   5. The mitral valve is abnormal. Mild mitral valve regurgitation. No  evidence of mitral stenosis.   6. The tricuspid valve is abnormal. Tricuspid valve regurgitation is  moderate.   7. The aortic valve is tricuspid. There is mild calcification of the  aortic valve. There is mild thickening of the aortic valve. Aortic valve  regurgitation is not visualized. No aortic stenosis is present.   8. The inferior vena cava is normal in size with greater than 50%  respiratory variability, suggesting right atrial pressure of 3 mmHg.      Physical Exam:   VS:  BP 116/64 (BP Location: Right Arm, Cuff Size: Normal)   Pulse 71   Ht 5\' 4"  (1.626 m)   Wt 163 lb (73.9 kg)   SpO2 95%   BMI 27.98 kg/m    Wt Readings from Last 3 Encounters:  03/05/24 163 lb (73.9 kg)  02/28/24 166 lb (75.3 kg)  01/23/24 166 lb 3.6 oz (75.4 kg)    GEN: Well nourished, well developed in no acute distress NECK: No JVD; No carotid bruits CARDIAC: RRR, Systolic murmur located in Left & Right 2nd ICS 2/6,  no rubs, gallops RESPIRATORY:  Clear to auscultation without rales, wheezing or rhonchi  ABDOMEN: Soft, non-tender, non-distended EXTREMITIES:  1+ pitting edema bilaterally; No deformity   ASSESSMENT AND PLAN: .   Afib/atypical aflutter with  variable conduction Diagnosed 02/2021. S/P TEE/ DCCV but quickly returned to arrhythmia. Started on Amio and converted to NSR. Amio dose reduced to 100 mg daily in 11/2021 due to elevated TSH.  12/2023: Cr 1.16, K 5, AST/ALT 13/10; 02/2024: TSH WNL Denies any palpitations.  Continue Toprol  XL 75 mg daily, Eliquis  5 mg BID, and Amio 100 mg daily. Eliquis  is the appropriate dose given her age, weight and renal function.  HFimpEF Diagnosed 02/2021 in setting of afib with RVR.  ECHO 02/2021: EF 30-35%, 05/2021: EF 55-60%, 05/2023: 60-65%, G1DD, mild MR,  mod TR Labs reviewed above WNL.  Noted improvement in LE edema, however, 1+ edema  bilaterally noted on exam. Denied SOB.  Currently taking Lasix  20 mg daily. Recommended alternating between 20 mg daily and 40 mg daily of Lasix . If no improvement in edema in 1-2 weeks, can increase to Lasix  40 mg daily. Ordered BMP in 2 weeks Continue with Toprol  XL, Entresto  as above   MR/TR  ECHO 05/2021: mod MR/TR; 05/2023: mild MR, mod TR  Continue to monitor. Will plan for repeat ECHO in 05/2025 unless any changes.   HLD  12/2023 LDL  94, goal less 100 Continue daily dose of Lipitor 40 mg and Fenofibrate  160 mg  HTN  BP this OV: 116/64, home BP 130's/70's Continue Entresto  and toprol  XL as above  Dispo: Lasix  increased to alternating dose daily b/t 20 & 40 mg. If no improvement, increase to 40 mg daily. Ordered BMP in 2 weeks  Signed, Metta Actis, PA-C

## 2024-03-10 ENCOUNTER — Telehealth: Payer: Self-pay | Admitting: Family Medicine

## 2024-03-10 ENCOUNTER — Ambulatory Visit: Admitting: Nurse Practitioner

## 2024-03-10 NOTE — Telephone Encounter (Unsigned)
 Copied from CRM [id number]. Topic: Clinical - Prescription Issue >> Mar 10, 2024  8:53 AM Shelby Dessert H wrote: Reason for CRM: Patient called and wanted to speak to Concha Deed about helping her get her medicine ENTRESTO  24-26 MG, patient callback number is [phone], she states she always helps her so she can afford the cost. >> Mar 10, 2024  9:00 AM Ethelle Herb L wrote: Patient calling back, states call was disconnected. Patient states she only has about 3 or 4 pills left and is requesting a call back from [place].   Best call back number: [phone],

## 2024-03-11 ENCOUNTER — Telehealth: Payer: Self-pay

## 2024-03-11 NOTE — Progress Notes (Signed)
 Complex Care Management Care Guide Note  03/11/2024 Name: Heather Barber MRN: [id number] DOB: 05/01/1946  Heather Barber is a 78 y.o. year old female who is a primary care patient of Dettinger, Lucio Sabin, MD and is actively engaged with the care management team. I reached out to Heather Barber by phone today to assist with scheduling  with the Pharmacist.  Follow up plan: Telephone appointment with complex care management team member scheduled for:  03/27/2024  Lenton Rail , RMA     [place]  [facility], [facility] Guide  Direct Dial: [phone]  Website: [email or web address]

## 2024-03-11 NOTE — Progress Notes (Signed)
 Care Guide Pharmacy Note  03/11/2024 Name: JAZZLYN BORGIA MRN: [id number] DOB: 1946-10-15  Referred By: Hilton Lucky, MD Reason for referral: Complex Care Management (Outreach to schedule with Pharm d )   LORREN LOGSTON is a 78 y.o. year old female who is a primary care patient of Dettinger, Lucio Sabin, MD.  Milta Alosa Meddaugh was referred to the pharmacist for assistance related to: DMII  An unsuccessful telephone outreach was attempted today to contact the patient who was referred to the pharmacy team for assistance with medication assistance. Additional attempts will be made to contact the patient.  Lenton Rail , RMA     [facility]  [facility], [facility] Guide  Direct Dial: [phone]  Website: [email or web address]

## 2024-03-25 ENCOUNTER — Ambulatory Visit: Payer: Self-pay | Admitting: Physician Assistant

## 2024-03-25 ENCOUNTER — Other Ambulatory Visit (HOSPITAL_COMMUNITY)
Admission: RE | Admit: 2024-03-25 | Discharge: 2024-03-25 | Disposition: A | Discharge status: Home / Self Care | Source: Ambulatory Visit | Attending: Physician Assistant | Admitting: Physician Assistant

## 2024-03-25 DIAGNOSIS — Z79899 Other long term (current) drug therapy: Secondary | ICD-10-CM | POA: Insufficient documentation

## 2024-03-25 LAB — BASIC METABOLIC PANEL WITH GFR
Anion gap: 12 (ref 5–15)
BUN: 20 mg/dL (ref 8–23)
CO2: 26 mmol/L (ref 22–32)
Calcium: 9.9 mg/dL (ref 8.9–10.3)
Chloride: 100 mmol/L (ref 98–111)
Creatinine, Ser: 1.05 mg/dL — ABNORMAL HIGH (ref 0.44–1.00)
GFR, Estimated: 55 mL/min — ABNORMAL LOW (ref >60)
Glucose, Bld: 126 mg/dL — ABNORMAL HIGH (ref 70–99)
Potassium: 4.1 mmol/L (ref 3.5–5.1)
Sodium: 138 mmol/L (ref 135–145)

## 2024-03-26 ENCOUNTER — Other Ambulatory Visit

## 2024-03-26 NOTE — Progress Notes (Signed)
 03/27/2024 Name: Heather Barber MRN: [id number] DOB: 09/07/1946  Chief Complaint  Patient presents with   Diabetes   Hyperlipidemia   Congestive Heart Failure    Heather Barber is a 78 y.o. year old female who presented for a face to face visit today.   They were referred to the pharmacist by their PCP for assistance in managing medication access.    Subjective:  Care Team: Primary Care Provider: Dettinger, Lucio Sabin, MD ; Next Scheduled Visit: 06/02/24 Cardiologist: Dr. Amanda Jungling; Next Scheduled Visit: 07/16/24  Medication Access/Adherence  Current Pharmacy:  [id number] - [place] - [address] [place] [address] Phone: [phone] Fax: [phone]   Patient reports affordability concerns with their medications: Yes - Entresto  copay is too expensive Patient reports access/transportation concerns to their pharmacy: No  Patient reports adherence concerns with their medications:  No     Diabetes:  Current medications: metformin  500 mg BID   Hyperlipidemia/ASCVD Risk Reduction  Current lipid lowering medications: atorvastatin  40 mg daily, fenofibrate  160 mg daily  Antiplatelet regimen: none (on Eliquis  for afib)  Risk Factors: DM, CKD, HF  Dietary Habits: - since last lipid panel she has cut out fried foods and sweets - 3 meals day - Breakfast: boiled egg, honey nut cheerios - Lunch: salad, bananas, tomato sandwich - Supper: fish, green beans, vegetables - Snacks: apple or other fruit - Drinks: water    Current physical activity: She has started walking 1 mile per day  The 10-year ASCVD risk score (Arnett DK, et al., 2019) is: 36.8%   Values used to calculate the score:     Age: 18 years     Sex: Female     Is Non-Hispanic African American: No     Diabetic: Yes     Tobacco smoker: No     Systolic Blood Pressure: 116 mmHg     Is BP treated: Yes     HDL Cholesterol: 42 mg/dL     Total Cholesterol: 200  mg/dL   HFimpEF (EF 25-36%):  Current medications:  ACEi/ARB/ARNI: Entresto  24-26 mg BID SGLT2i: none Beta blocker: metoprolol  XL 75 mg daily Mineralocorticoid Receptor Antagonist: none Diuretic regimen: furosemide  20 mg daily alternating with 40 mg daily  Patient denies volume overload signs or symptoms including shortness of breath, lower extremity edema.  Current physical activity: She has started walking 1 mile per day  Current medication access support: Re-enrolled in [employer] grant for Entresto  today   Objective:  Lab Results  Component Value Date   HGBA1C 7.0 (H) 02/28/2024    Lab Results  Component Value Date   CREATININE 1.05 (H) 03/25/2024   BUN 20 03/25/2024   NA 138 03/25/2024   K 4.1 03/25/2024   CL 100 03/25/2024   CO2 26 03/25/2024    Lab Results  Component Value Date   CHOL 200 (H) 02/28/2024   HDL 42 02/28/2024   LDLCALC 94 02/28/2024   TRIG 385 (H) 02/28/2024   CHOLHDL 4.8 (H) 02/28/2024    Medications Reviewed Today     Reviewed by Philmore Bream, RPH (Pharmacist) on 03/27/24 at 1445  Med List Status: <None>   Medication Order Taking? Sig Documenting Provider Last Dose Status Informant  Accu-Chek Softclix Lancets lancets [id number]  1 each by Other route daily at 12 noon. Use as instructed Dettinger, Lucio Sabin, MD  Active   amiodarone  (PACERONE ) 200 MG tablet [id number] Yes TAKE 1/2 TABLET BY MOUTH DAILY Armida Lander  F, MD Taking Active   atorvastatin  (LIPITOR) 40 MG tablet [id number] Yes Take 1 tablet (40 mg total) by mouth at bedtime. Dettinger, Lucio Sabin, MD Taking Active   [facility] VITAMIN B12 1000 MCG tablet [id number] Yes Take 1,000 mcg by mouth daily. [provider] Taking Active   ELIQUIS  5 MG TABS tablet [id number] Yes TAKE 1 TABLET BY MOUTH TWICE A DAY Laurann Pollock, MD Taking Active   ENTRESTO  24-26 MG [id number] Yes TAKE 1 TABLET BY MOUTH TWICE A DAY Laurann Pollock, MD Taking Active   estradiol  (ESTRACE ) 0.1 MG/GM  vaginal cream [id number] Yes APPLY 1/2 INCH IN APPLICATOR PER VAGINA 2 NIGHTS A Ailene Alexandria, MD Taking Active   fenofibrate  160 MG tablet [id number] Yes TAKE 1 TABLET BY MOUTH EVERY DAY Branch, Joyceann No, MD Taking Active   ferrous sulfate  325 (65 FE) MG EC tablet [id number] Yes Take 1 tablet (325 mg total) by mouth daily with breakfast. Rosebud Confer, Rebekah M, PA-C Taking Active   flavoxATE (URISPAS) 100 MG tablet [id number]  Take 100 mg by mouth every 6 (six) hours as needed. [provider]  Active   furosemide  (LASIX ) 20 MG tablet [id number] Yes Take 20 mg Daily alternating with 40 mg Daily Dunlap, Scotesia Y, PA-C Taking Active   glucose blood (ACCU-CHEK GUIDE) test strip [id number]  1 each by Other route daily. Use as instructed Dettinger, Lucio Sabin, MD  Active   hydroxyurea  (HYDREA ) 500 MG capsule [id number] Yes Take 1 capsule (500 mg total) by mouth daily. Sheril Dines M, PA-C Taking Active   ketoconazole  (NIZORAL ) 2 % cream [id number] Yes Apply 1 application topically 2 (two) times daily. Dettinger, Lucio Sabin, MD Taking Active   levothyroxine  (SYNTHROID ) 75 MCG tablet [id number] Yes TAKE 1 TABLET BY MOUTH DAILY BEFORE BREAKFAST. Dettinger, Lucio Sabin, MD Taking Active   metFORMIN  (GLUCOPHAGE ) 500 MG tablet [id number] Yes TAKE 1 TABLET BY MOUTH TWICE A DAY WITH FOOD Dettinger, Lucio Sabin, MD Taking Active   methenamine  (HIPREX ) 1 g tablet [id number] Yes TAKE 1 TABLET (1 G TOTAL) BY MOUTH 2 (TWO) TIMES DAILY WITH A MEAL. Trent Frizzle, MD Taking Active   metoprolol  succinate (TOPROL -XL) 50 MG 24 hr tablet [id number]  Take 1.5 tablets (75 mg total) by mouth daily. Take with or immediately following a meal. Laurann Pollock, MD  Expired 03/05/24 2359   Multiple Vitamins-Minerals (MULTIVITAMIN WOMEN 50+) TABS [id number]  Take by mouth daily. [provider]  Active Self  naproxen sodium (ALEVE) 220 MG tablet [id number]  Take 220 mg by mouth as needed. [provider]   Active Self  omeprazole  (PRILOSEC) 20 MG capsule [id number] Yes TAKE 1 CAPSULE BY MOUTH EVERY DAY Dettinger, Lucio Sabin, MD Taking Active   potassium chloride  SA (KLOR-CON  M) 20 MEQ tablet [id number] No Take 2 tablets (40 mEq total) by mouth daily.  Patient not taking: Reported on 03/27/2024   Mallipeddi, Kennyth Pean, MD Not Taking Active               Assessment/Plan:   Diabetes: - Currently controlled with last A1C 7.0% on 02/28/24.  - Reviewed long term cardiovascular and renal outcomes of uncontrolled blood sugar - Reviewed goal A1c, goal fasting, and goal 2 hour post prandial glucose - Recommend to continue metformin  500 mg BID  - Recommend to check glucose as needed    Hyperlipidemia/ASCVD Risk Reduction: - Currently uncontrolled with last LDL 94 mg/dL, above goal <53 mg/dL given DM. TG also elevated at 385  mg/dL. Since then she has made significant lifestyle changes to help lower her cholesterol including cutting out fried foods and sweets, eating more fresh fruit, and has started exercising (walking 1 mile every day). - Reviewed long term complications of uncontrolled cholesterol - Reviewed dietary recommendations including: commended her for dietary changes and encouraged her to focus on heart healthy diet (fresh fruits and vegetables, lean proteins, whole grains) - Reviewed lifestyle recommendations including: encouraged her to continue exercising - Recommend to continue atorvastatin  40 mg daily and fenofibrate  160 mg daily - Recheck lipid panel at next PCP visit    Heart Failure: - Currently managed by cardiology. Room for optimization with addition of SGLT-2 given diagnosis of HF, CKD, and DM, however she does have a history of UTIs. She is on daily furosemide  and the dose was recently adjusted by cardiology on 03/05/24. She reports alternating taking furosemide  20 mg and 40 mg daily. Denies s/sx of fluid overload and says she is overall feeling much better. Could consider SGLT-2  in the future. - Reviewed appropriate blood pressure monitoring technique and reviewed goal blood pressure - Recommend to continue current regimen  - Re-enrolled her in [employer] today and provided her with information to take to her pharmacy to reduce cost of Entresto  to [employer] Information: ID: [id number] BIN: [id number] PCN: [id number] Group: [id number]   Follow Up Plan: PharmD on 6/17, PCP on 8/4, Cardiology on 9/17  Georga Killings, PharmD PGY-1 Pharmacy Resident

## 2024-03-27 ENCOUNTER — Ambulatory Visit

## 2024-03-27 DIAGNOSIS — E781 Pure hyperglyceridemia: Secondary | ICD-10-CM

## 2024-03-27 DIAGNOSIS — Z7984 Long term (current) use of oral hypoglycemic drugs: Secondary | ICD-10-CM | POA: Diagnosis not present

## 2024-03-27 DIAGNOSIS — E782 Mixed hyperlipidemia: Secondary | ICD-10-CM | POA: Diagnosis not present

## 2024-03-27 DIAGNOSIS — E1169 Type 2 diabetes mellitus with other specified complication: Secondary | ICD-10-CM

## 2024-04-14 NOTE — Progress Notes (Deleted)
   04/14/2024 Name: Heather Barber MRN: [id number] DOB: 04-04-1946  No chief complaint on file.   {Visit Type:26650}   Subjective:  Care Team: Primary Care Provider: Dettinger, Lucio Sabin, MD ; Next Scheduled Visit: *** {careteamprovider:27366}  Medication Access/Adherence  Current [facility]acy:  [phone] [place],  - [address] [place] [address] Phone: [phone] Fax: [phone]   Patient reports affordability concerns with their medications: {YES/NO:21197} Patient reports access/transportation concerns to their pharmacy: {YES/NO:21197} Patient reports adherence concerns with their medications:  {YES/NO:21197} ***   Diabetes:  Current medications: metformin  500 mg BID Medications tried in the past:   Current glucose readings: *** Using *** meter; testing *** times daily  Patient {Actions; denies-reports:120008} hypoglycemic s/sx including ***dizziness, shakiness, sweating. Patient {Actions; denies-reports:120008} hyperglycemic symptoms including ***polyuria, polydipsia, polyphagia, nocturia, neuropathy, blurred vision.  Current meal patterns:  - Breakfast: *** - Lunch *** - Supper *** - Snacks *** - Drinks ***  Current physical activity: ***  Current medication access support: *** Hyperlipidemia/ASCVD Risk Reduction  Current lipid lowering medications: atorvastatin  40 mg daily, fenofibrate  160 mg daily Medications tried in the past:   Antiplatelet regimen: none (on Eliquis  for Afib)  Risk Factors: DM, CKD, HF   Current physical activity: Walking 1 mile per day??  Dietary Habits: - since last lipid panel she has cut out fried foods and sweets - 3 meals day - Breakfast: boiled egg, honey nut cheerios - Lunch: salad, bananas, tomato sandwich - Supper: fish, green beans, vegetables - Snacks: apple or other fruit - Drinks: water    The 10-year ASCVD risk score (Arnett DK, et al., 2019) is: 36.8%   Values  used to calculate the score:     Age: 78 years     Clincally relevant sex: Female     Is Non-Hispanic African American: No     Diabetic: Yes     Tobacco smoker: No     Systolic Blood Pressure: 116 mmHg     Is BP treated: Yes     HDL Cholesterol: 42 mg/dL     Total Cholesterol: 200 mg/dL  HFimpEF (EF 40-10%):   Current medications:  ACEi/ARB/ARNI: Entresto  24-26 mg BID SGLT2i: none Beta blocker: metoprolol  XL 75 mg daily Mineralocorticoid Receptor Antagonist: none Diuretic regimen: furosemide  20 mg daily alternating with 40 mg daily  Current home blood pressure readings: *** Current home weights: ***  Patient {Actions; denies-reports:120008} volume overload signs or symptoms including ***shortness of breath, lower extremity edema, increased use of pillows at night   Current physical activity: ***  Current medication access support: Enrolled in [employer] grant for Entresto    Objective:  Lab Results  Component Value Date   HGBA1C 7.0 (H) 02/28/2024    Lab Results  Component Value Date   CREATININE 1.05 (H) 03/25/2024   BUN 20 03/25/2024   NA 138 03/25/2024   K 4.1 03/25/2024   CL 100 03/25/2024   CO2 26 03/25/2024    Lab Results  Component Value Date   CHOL 200 (H) 02/28/2024   HDL 42 02/28/2024   LDLCALC 94 02/28/2024   TRIG 385 (H) 02/28/2024   CHOLHDL 4.8 (H) 02/28/2024    Medications Reviewed Today   Medications were not reviewed in this encounter       Assessment/Plan:   {Pharmacy A/P Choices:26421}  Follow Up Plan: ***  ***

## 2024-04-15 ENCOUNTER — Other Ambulatory Visit

## 2024-04-24 ENCOUNTER — Other Ambulatory Visit

## 2024-04-24 DIAGNOSIS — E611 Iron deficiency: Secondary | ICD-10-CM

## 2024-04-24 DIAGNOSIS — E538 Deficiency of other specified B group vitamins: Secondary | ICD-10-CM

## 2024-04-24 DIAGNOSIS — Z1589 Genetic susceptibility to other disease: Secondary | ICD-10-CM

## 2024-04-24 DIAGNOSIS — D473 Essential (hemorrhagic) thrombocythemia: Secondary | ICD-10-CM

## 2024-04-25 LAB — COMPREHENSIVE METABOLIC PANEL WITH GFR
ALT: 13 IU/L (ref 0–32)
AST: 8 IU/L (ref 0–40)
Albumin: 4.6 g/dL (ref 3.8–4.8)
Alkaline Phosphatase: 51 IU/L (ref 44–121)
BUN/Creatinine Ratio: 26 (ref 12–28)
BUN: 25 mg/dL (ref 8–27)
Bilirubin Total: 0.2 mg/dL (ref 0.0–1.2)
CO2: 22 mmol/L (ref 20–29)
Calcium: 10.2 mg/dL (ref 8.7–10.3)
Chloride: 102 mmol/L (ref 96–106)
Creatinine, Ser: 0.95 mg/dL (ref 0.57–1.00)
Globulin, Total: 2.1 g/dL (ref 1.5–4.5)
Glucose: 122 mg/dL — ABNORMAL HIGH (ref 70–99)
Potassium: 4.8 mmol/L (ref 3.5–5.2)
Sodium: 142 mmol/L (ref 134–144)
Total Protein: 6.7 g/dL (ref 6.0–8.5)
eGFR: 62 mL/min/{1.73_m2} (ref >59)

## 2024-04-25 LAB — CBC WITH DIFFERENTIAL/PLATELET
Basophils Absolute: 0.1 10*3/uL (ref 0.0–0.2)
Basos: 1 %
EOS (ABSOLUTE): 0.1 10*3/uL (ref 0.0–0.4)
Eos: 2 %
Hematocrit: 34.2 % (ref 34.0–46.6)
Hemoglobin: 11.8 g/dL (ref 11.1–15.9)
Immature Grans (Abs): 0 10*3/uL (ref 0.0–0.1)
Immature Granulocytes: 0 %
Lymphocytes Absolute: 2.2 10*3/uL (ref 0.7–3.1)
Lymphs: 36 %
MCH: 36.6 pg — ABNORMAL HIGH (ref 26.6–33.0)
MCHC: 34.5 g/dL (ref 31.5–35.7)
MCV: 106 fL — ABNORMAL HIGH (ref 79–97)
Monocytes Absolute: 0.5 10*3/uL (ref 0.1–0.9)
Monocytes: 8 %
Neutrophils Absolute: 3.3 10*3/uL (ref 1.4–7.0)
Neutrophils: 52 %
Platelets: 381 10*3/uL (ref 150–450)
RBC: 3.22 x10E6/uL — ABNORMAL LOW (ref 3.77–5.28)
RDW: 13.1 % (ref 11.7–15.4)
WBC: 6.2 10*3/uL (ref 3.4–10.8)

## 2024-04-25 LAB — IRON AND TIBC
Iron Saturation: 13 % — ABNORMAL LOW (ref 15–55)
Iron: 59 ug/dL (ref 27–139)
Total Iron Binding Capacity: 440 ug/dL (ref 250–450)
UIBC: 381 ug/dL — ABNORMAL HIGH (ref 118–369)

## 2024-04-25 LAB — FERRITIN: Ferritin: 47 ng/mL (ref 15–150)

## 2024-04-27 LAB — LACTATE DEHYDROGENASE: LDH: 183 IU/L (ref 119–226)

## 2024-04-27 LAB — VITAMIN B12: Vitamin B-12: 345 pg/mL (ref 232–1245)

## 2024-04-27 LAB — METHYLMALONIC ACID, SERUM: Methylmalonic Acid: 176 nmol/L (ref 0–378)

## 2024-04-28 NOTE — Progress Notes (Unsigned)
 [facility] [address], [place] [address]   CLINIC:  Medical Oncology/Hematology  PCP:  Dettinger, Fonda LABOR, MD [address] [place] [address] [phone]   REASON FOR VISIT:  Follow-up for JAK2 positive essential thrombocytosis   PRIOR THERAPY: None   CURRENT THERAPY: Hydrea  500 mg once daily   INTERVAL HISTORY:   Heather Barber 78 y.o. female returns for routine follow-up of her JAK2 positive essential thrombocytosis.  She was last seen by Pleasant Barefoot PA-C on 01/23/2024.  At today's visit, she reports feeling fairly well.  *** ***No recent hospitalizations, surgeries, or changes in baseline health status.  ESSENTIAL THROMBOCYTOSIS: She is tolerating Hydrea  well at 500 mg daily.***   She denies any GI symptoms such as nausea, vomiting, diarrhea.  She has not noted any mouth sores or skin ulcers.    ***Energy levels are good.   ***She denies any erythromelalgia, aquagenic pruritus, Raynaud's phenomenon, or vasomotor symptoms.   ***No interval DVT or PE.   ***No B symptoms such as fever, chills, night sweats, unintentional weight loss.    IRON DEFICIENCY ANEMIA:  ***She reports some dyspnea on exertion related to her congestive heart failure.   ***She denies any chest pain, lightheadedness, headaches, or syncopal episodes.   ***She has not noticed any bleeding such as epistaxis, hematemesis, hematochezia, or melena.   ***She does continue to have microscopic hematuria as well as occasional gross hematuria, which is thought to be infectious (hemorrhagic cystitis) after having been worked up by urology.   ***No pelvic pain, B symptoms, or abnormal vaginal bleeding at this time. ***Taking EOD B12 and daily iron supplements??  Both are trending downward.  ***  She has 75***% energy and 100***% appetite. ***She endorses that she is maintaining a stable weight.  ASSESSMENT & PLAN:  1.  JAK2 positive essential thrombocytosis - High risk because of  her advanced age - Hydroxyurea  500 mg daily, currently tolerating well - No prior history of thrombosis.  She takes Eliquis  for Afib. - She does not report any aquagenic pruritus or vasomotor symptoms. ***   - No lymphadenopathy or splenomegaly on exam today (***) - Most recent labs (04/24/2024): Platelets 381.  Hgb 11.8/MCV 106.  Normal WBC and differential.  Baseline CMP, normal LDH. - PLAN: Continue Hydrea  500 mg daily    *** - RTC 4 months for repeat labs and follow-up.***    2.  Anemia w/ iron deficiency + B12 deficiency - Labs in November 2022 showed mildly decreased Hgb 11.1, MCV 120.8 (macrocytosis secondary to Hydrea ). - She was also noted to have mild iron deficiency (ferritin 56, iron saturation 17%, TIBC 508) and B12 deficiency (147) - Additional labs for work-up of anemia were unremarkable (normal creatinine 0.86, normal folate, copper ; SPEP and free light chains unremarkable) - She takes daily iron tablet.  Vitamin B12 every other day.  *** B12 and iron both trending downward??  *** - Most recent labs (04/24/2024):  Hgb 11.8/MCV 106 Ferritin 47, iron saturation 13% Vitamin B12 345, MMA 176 Stool cards are negative for blood x 3 - No bright blood per rectum or melena.  Intermittent hematuria related to hemorrhagic cystitis.*** - Last EGD/colonoscopy was many years ago.  She had negative Cologuard in September 2022. - Some degree of anemia secondary to Hydrea  permissive in the setting of cytoreductive therapy for essential thrombocytosis, as well as anemia secondary to iron deficiency and B12 deficiency. - PLAN: Continue same dose of Hydrea  as above.  Mild anemia likely related to B12 and iron levels. - IRON DEFICIENCY: *** TBD, depends on compliance, may need IV iron.  *** Continue ferrous sulfate  325 mg daily    - B12 DEFICIENCY: *** TBD, depends on compliance.  *** Continue B12 supplement 1000 mcg daily every other day  (Recheck B12/MMA at next visit in about 3 months - June  2025) - CBC and iron panel in 3 months   3.  Stage Ia grade 1 endometrioid adenocarcinoma - Status post robotic staging on 12/09/2019. - MMR normal. - Due to low risk of recurrence, no adjuvant therapy was recommended. - Seen by GYN in June 2023 due to isolated episode of vaginal bleeding, no concerns noted per GYN note - No pelvic pain, B symptoms, or abnormal vaginal bleeding at this time   ***  - PLAN: Continue follow-up with GYN every 6 months for 5 years.***   PLAN SUMMARY: *** TBD *** >> *** IV iron *** >> ***Labs in 3 months at [facility] (via [facility]) = CBC/D, CMP, LDH, ferritin, iron/TIBC, B12, MMA >> OFFICE visit in 3 months (1 week after labs)***     REVIEW OF SYSTEMS: ***  Review of Systems  Constitutional:  Negative for appetite change, chills, diaphoresis, fatigue, fever and unexpected weight change.  HENT:   Negative for lump/mass and nosebleeds.   Eyes:  Negative for eye problems.  Respiratory:  Positive for shortness of breath. Negative for cough and hemoptysis.   Cardiovascular:  Positive for palpitations. Negative for chest pain and leg swelling.  Gastrointestinal:  Positive for nausea. Negative for abdominal pain, blood in stool, constipation, diarrhea and vomiting.  Genitourinary:  Positive for hematuria. Negative for dysuria.   Skin: Negative.   Neurological:  Negative for dizziness, headaches and light-headedness.  Hematological:  Does not bruise/bleed easily.     PHYSICAL EXAM:***  ECOG PERFORMANCE STATUS: 0 - Asymptomatic  There were no vitals filed for this visit.  There were no vitals filed for this visit.  Physical Exam Constitutional:      Appearance: Normal appearance. She is obese.   Cardiovascular:     Heart sounds: Normal heart sounds.  Pulmonary:     Breath sounds: Normal breath sounds.   Neurological:     General: No focal deficit present.     Mental Status: Mental status is at baseline.   Psychiatric:        Behavior: Behavior normal.  Behavior is cooperative.    PAST MEDICAL/SURGICAL HISTORY:  Past Medical History:  Diagnosis Date   (HFpEF) heart failure with preserved ejection fraction ([name])    a. EF previously 30-35% in 02/2021 in the setting of afib w/RVR --> normalized by repeat imaging in 05/2021.   Anxiety    Atrial flutter ([name])    a. s/p DCCV in 02/2021   Atrial septal defect    Surgical repair in 1994   Cardiomyopathy [facility])    Chest pain    Normal coronary angiography in 1994 and 2011   Dizziness    Endometrial cancer, grade I ([name]) 10/09/2019   Essential thrombocythemia ([name]) 06/06/2016   GERD (gastroesophageal reflux disease)    History of kidney stones    Hyperlipidemia    Hypertension    LBBB (left bundle branch block) 2011   Mitral regurgitation    Palpitations    Persistent atrial fibrillation ([name])    PONV (postoperative nausea and vomiting)    Syncope    Tricuspid regurgitation    Type 2 diabetes mellitus  with other specified complication ([name]) 07/18/2019   Ventricular tachycardia ([name])    Exercise induced   Past Surgical History:  Procedure Laterality Date   APPENDECTOMY     ASD REPAIR  1994   [facility]   BREAST EXCISIONAL BIOPSY     Right and left   BREAST SURGERY     right and left breast-benign   CARDIAC CATHETERIZATION  12/24/2009   Dr. Wonda    CARDIOVERSION N/A 03/04/2021   Procedure: CARDIOVERSION;  Surgeon: Alvan Dorn FALCON, MD;  Location: [facility];  Service: Endoscopy;  Laterality: N/A;   CATARACT EXTRACTION W/PHACO Left 03/11/2013   Procedure: CATARACT EXTRACTION PHACO AND INTRAOCULAR LENS PLACEMENT (IOC);  Surgeon: Oneil T. Roz, MD;  Location: [facility];  Service: Ophthalmology;  Laterality: Left;  CDE:  12.20   HYSTEROSCOPY WITH D & C N/A 11/11/2019   Procedure: DILATATION AND CURETTAGE /HYSTEROSCOPY;  Surgeon: Edsel Norleen GAILS, MD;  Location: [facility];  Service: Gynecology;  Laterality: N/A;   POLYPECTOMY N/A 11/11/2019   Procedure: POLYPECTOMY(ENDOMETRIAL  POLYP);  Surgeon: Edsel Norleen GAILS, MD;  Location: [facility];  Service: Gynecology;  Laterality: N/A;   ROBOTIC ASSISTED LAPAROSCOPIC HYSTERECTOMY AND SALPINGECTOMY Bilateral 12/09/2019   Procedure: XI ROBOTIC ASSISTED LAPAROSCOPIC HYSTERECTOMY BILATERAL SALPINGOOOPHORECTOMY;  Surgeon: Eloy Herring, MD;  Location: [facility];  Service: Gynecology;  Laterality: Bilateral;   SENTINEL NODE BIOPSY N/A 12/09/2019   Procedure: SENTINEL LYMPH  NODE BIOPSY;  Surgeon: Eloy Herring, MD;  Location: [facility];  Service: Gynecology;  Laterality: N/A;   TEE WITHOUT CARDIOVERSION N/A 03/04/2021   Procedure: TRANSESOPHAGEAL ECHOCARDIOGRAM (TEE);  Surgeon: Alvan Dorn FALCON, MD;  Location: [facility];  Service: Endoscopy;  Laterality: N/A;   TONSILLECTOMY     TUBAL LIGATION     Bilateral    SOCIAL HISTORY:  Social History   Socioeconomic History   Marital status: Widowed    Spouse name: widowed   Number of children: 3   Years of education: Not on file   Highest education level: Not on file  Occupational History   Occupation: Production designer, theatre/television/film shop  Tobacco Use   Smoking status: Never   Smokeless tobacco: Never  Vaping Use   Vaping status: Never Used  Substance and Sexual Activity   Alcohol use: No   Drug use: No   Sexual activity: Not Currently    Birth control/protection: Post-menopausal, Surgical    Comment: hyst  Other Topics Concern   Not on file  Social History Narrative   Married and resides in [place]. 3 children. 3 grandchildren.   Sedentary lifestyle   Social Drivers of Corporate investment banker Strain: Low Risk  (03/29/2023)   Overall Financial Resource Strain (CARDIA)    Difficulty of Paying Living Expenses: Not hard at all  Food Insecurity: No Food Insecurity (03/29/2023)   Hunger Vital Sign    Worried About Running Out of Food in the Last Year: Never true    Ran Out of Food in the Last Year: Never true  Transportation Needs: No Transportation Needs (03/29/2023)   PRAPARE - Therapist, art (Medical): No    Lack of Transportation (Non-Medical): No  Physical Activity: Insufficiently Active (03/29/2023)   Exercise Vital Sign    Days of Exercise per Week: 1 day    Minutes of Exercise per Session: 10 min  Stress: No Stress Concern Present (03/29/2023)   [employer] of Occupational Health - Occupational Stress Questionnaire    Feeling of  Stress : Not at all  Social Connections: Moderately Isolated (03/29/2023)   Social Connection and Isolation Panel    Frequency of Communication with Friends and Family: Three times a week    Frequency of Social Gatherings with Friends and Family: Once a week    Attends Religious Services: More than 4 times per year    Active Member of [employer] or Organizations: No    Attends Banker Meetings: Never    Marital Status: Widowed  Intimate Partner Violence: Not At Risk (03/29/2023)   Humiliation, Afraid, Rape, and Kick questionnaire    Fear of Current or Ex-Partner: No    Emotionally Abused: No    Physically Abused: No    Sexually Abused: No    FAMILY HISTORY:  Family History  Problem Relation Age of Onset   Cancer Mother        female   Heart failure Father    Stroke Sister    Cancer Sister        breast   Coronary artery disease Neg Hx     CURRENT MEDICATIONS:  Outpatient Encounter Medications as of 04/29/2024  Medication Sig   Accu-Chek Softclix Lancets lancets 1 each by Other route daily at 12 noon. Use as instructed   amiodarone  (PACERONE ) 200 MG tablet TAKE 1/2 TABLET BY MOUTH DAILY   atorvastatin  (LIPITOR) 40 MG tablet Take 1 tablet (40 mg total) by mouth at bedtime.   [employer] VITAMIN B12 1000 MCG tablet Take 1,000 mcg by mouth daily.   ELIQUIS  5 MG TABS tablet TAKE 1 TABLET BY MOUTH TWICE A DAY   ENTRESTO  24-26 MG TAKE 1 TABLET BY MOUTH TWICE A DAY   estradiol  (ESTRACE ) 0.1 MG/GM vaginal cream APPLY 1/2 INCH IN APPLICATOR PER VAGINA 2 NIGHTS A WEEK   fenofibrate  160 MG tablet TAKE 1 TABLET BY  MOUTH EVERY DAY   ferrous sulfate  325 (65 FE) MG EC tablet Take 1 tablet (325 mg total) by mouth daily with breakfast.   flavoxATE (URISPAS) 100 MG tablet Take 100 mg by mouth every 6 (six) hours as needed.   furosemide  (LASIX ) 20 MG tablet Take 20 mg Daily alternating with 40 mg Daily   glucose blood (ACCU-CHEK GUIDE) test strip 1 each by Other route daily. Use as instructed   hydroxyurea  (HYDREA ) 500 MG capsule Take 1 capsule (500 mg total) by mouth daily.   ketoconazole  (NIZORAL ) 2 % cream Apply 1 application topically 2 (two) times daily.   levothyroxine  (SYNTHROID ) 75 MCG tablet TAKE 1 TABLET BY MOUTH DAILY BEFORE BREAKFAST.   metFORMIN  (GLUCOPHAGE ) 500 MG tablet TAKE 1 TABLET BY MOUTH TWICE A DAY WITH FOOD   methenamine  (HIPREX ) 1 g tablet TAKE 1 TABLET (1 G TOTAL) BY MOUTH 2 (TWO) TIMES DAILY WITH A MEAL.   metoprolol  succinate (TOPROL -XL) 50 MG 24 hr tablet Take 1.5 tablets (75 mg total) by mouth daily. Take with or immediately following a meal.   Multiple Vitamins-Minerals (MULTIVITAMIN WOMEN 50+) TABS Take by mouth daily.   naproxen sodium (ALEVE) 220 MG tablet Take 220 mg by mouth as needed.   omeprazole  (PRILOSEC) 20 MG capsule TAKE 1 CAPSULE BY MOUTH EVERY DAY   potassium chloride  SA (KLOR-CON  M) 20 MEQ tablet Take 2 tablets (40 mEq total) by mouth daily. (Patient not taking: Reported on 03/27/2024)   No facility-administered encounter medications on file as of 04/29/2024.    ALLERGIES:  Allergies  Allergen Reactions   Codeine Nausea And Vomiting   Morphine  Other (  See Comments)    GI symptoms    LABORATORY DATA:  I have reviewed the labs as listed.  CBC    Component Value Date/Time   WBC 6.2 04/24/2024 1556   WBC 6.7 06/01/2023 1222   RBC 3.22 (L) 04/24/2024 1556   RBC 3.15 (L) 06/01/2023 1222   HGB 11.8 04/24/2024 1556   HCT 34.2 04/24/2024 1556   PLT 381 04/24/2024 1556   MCV 106 (H) 04/24/2024 1556   MCH 36.6 (H) 04/24/2024 1556   MCH 36.2 (H) 06/01/2023 1222    MCHC 34.5 04/24/2024 1556   MCHC 33.5 06/01/2023 1222   RDW 13.1 04/24/2024 1556   LYMPHSABS 2.2 04/24/2024 1556   MONOABS 0.5 06/01/2023 1222   EOSABS 0.1 04/24/2024 1556   BASOSABS 0.1 04/24/2024 1556      Latest Ref Rng & Units 04/24/2024    3:56 PM 03/25/2024    3:19 PM 01/16/2024    3:57 PM  CMP  Glucose 70 - 99 mg/dL 877  873  824   BUN 8 - 27 mg/dL 25  20  26    Creatinine 0.57 - 1.00 mg/dL 9.04  8.94  8.83   Sodium 134 - 144 mmol/L 142  138  139   Potassium 3.5 - 5.2 mmol/L 4.8  4.1  5.0   Chloride 96 - 106 mmol/L 102  100  100   CO2 20 - 29 mmol/L 22  26  23    Calcium  8.7 - 10.3 mg/dL 89.7  9.9  89.7   Total Protein 6.0 - 8.5 g/dL 6.7   6.7   Total Bilirubin 0.0 - 1.2 mg/dL 0.2   0.3   Alkaline Phos 44 - 121 IU/L 51   62   AST 0 - 40 IU/L 8   13   ALT 0 - 32 IU/L 13   10     DIAGNOSTIC IMAGING:  I have independently reviewed the relevant imaging and discussed with the patient.   WRAP UP:  All questions were answered. The patient knows to call the clinic with any problems, questions or concerns.  Medical decision making: Moderate***  Time spent on visit: I spent 20 minutes counseling the patient face to face. The total time spent in the appointment was 30 minutes and more than 50% was on counseling.  Pleasant CHRISTELLA Barefoot, PA-C  ***

## 2024-04-29 ENCOUNTER — Inpatient Hospital Stay: Attending: Physician Assistant | Admitting: Physician Assistant

## 2024-04-29 DIAGNOSIS — Z7901 Long term (current) use of anticoagulants: Secondary | ICD-10-CM | POA: Insufficient documentation

## 2024-04-29 DIAGNOSIS — D473 Essential (hemorrhagic) thrombocythemia: Secondary | ICD-10-CM | POA: Diagnosis present

## 2024-04-29 DIAGNOSIS — D509 Iron deficiency anemia, unspecified: Secondary | ICD-10-CM | POA: Diagnosis not present

## 2024-04-29 DIAGNOSIS — Z79899 Other long term (current) drug therapy: Secondary | ICD-10-CM | POA: Insufficient documentation

## 2024-04-29 DIAGNOSIS — Z803 Family history of malignant neoplasm of breast: Secondary | ICD-10-CM | POA: Diagnosis not present

## 2024-04-29 DIAGNOSIS — Z1589 Genetic susceptibility to other disease: Secondary | ICD-10-CM | POA: Diagnosis not present

## 2024-04-29 DIAGNOSIS — E611 Iron deficiency: Secondary | ICD-10-CM | POA: Diagnosis not present

## 2024-04-29 DIAGNOSIS — D519 Vitamin B12 deficiency anemia, unspecified: Secondary | ICD-10-CM | POA: Diagnosis not present

## 2024-04-29 DIAGNOSIS — E538 Deficiency of other specified B group vitamins: Secondary | ICD-10-CM | POA: Diagnosis not present

## 2024-04-29 DIAGNOSIS — Z8542 Personal history of malignant neoplasm of other parts of uterus: Secondary | ICD-10-CM | POA: Diagnosis not present

## 2024-04-29 NOTE — Patient Instructions (Signed)
 [facility] at [facility] Discharge Instructions  You were seen today by Pleasant Barefoot PA-C for your elevated platelets and mild anemia.  ESSENTIAL THROMBOCYTOSIS (elevated platelets): - Your platelet levels look good. - Continue hydroxyurea  (Hydrea ) to 500 mg (1 tablet) once per day.   - We will recheck your levels again in 3 months.  MILD ANEMIA: -Your blood count (hemoglobin) is slightly low. - Your iron and B12 levels are also slightly low. - CONTINUE taking ferrous sulfate  (iron supplement) 325 mg daily.   - INCREASE your vitamin B12 (cyanocobalamin ) 1,000 mcg (1 mg) to take this EVERY DAY (instead of every other day).  LABS: Return in 3 months for repeat labs  FOLLOW-UP APPOINTMENT: Office visit in 3 months with labs the week before   ** Thank you for trusting me with your healthcare!  I strive to provide all of my patients with quality care at each visit.  If you receive a survey for this visit, I would be so grateful to you for taking the time to provide feedback.  Thank you in advance!  ~ Rishabh Rinkenberger                   Dr. Alean Stands   &   Pleasant Barefoot, PA-C   - - - - - - - - - - - - - - - - - -     Thank you for choosing [facility] at [facility] to provide your oncology and hematology care.  To afford each patient quality time with our provider, please arrive at least 15 minutes before your scheduled appointment time.   If you have a lab appointment with the Cancer Center please come in thru the Main Entrance and check in at the main information desk.  You need to re-schedule your appointment should you arrive 10 or more minutes late.  We strive to give you quality time with our providers, and arriving late affects you and other patients whose appointments are after yours.  Also, if you no show three or more times for appointments you may be dismissed from the clinic at the providers discretion.     Again, thank  you for choosing [facility].  Our hope is that these requests will decrease the amount of time that you wait before being seen by our physicians.       _____________________________________________________________  Should you have questions after your visit to [facility], please contact our office at [phone] and follow the prompts.  Our office hours are 8:00 a.m. and 4:30 p.m. Monday - Friday.  Please note that voicemails left after 4:00 p.m. may not be returned until the following business day.  We are closed weekends and major holidays.  You do have access to a nurse 24-7, just call the main number to the clinic [phone] and do not press any options, hold on the line and a nurse will answer the phone.    For prescription refill requests, have your pharmacy contact our office and allow 72 hours.    Due to Covid, you will need to wear a mask upon entering the hospital. If you do not have a mask, a mask will be given to you at the Main Entrance upon arrival. For doctor visits, patients may have 1 support person age 39 or older with them. For treatment visits, patients can not have anyone with them due to social distancing guidelines and  our immunocompromised population.

## 2024-05-14 ENCOUNTER — Other Ambulatory Visit: Payer: Self-pay | Admitting: Cardiology

## 2024-05-14 NOTE — Telephone Encounter (Signed)
 Prescription refill request for Eliquis  received. Indication: PAF Last office visit: 03/05/24  GORMAN Kapur PA-C Scr: 0.95 on 04/24/24  Epic Age: 78 Weight: 73.9kg  Based on above findings Eliquis  5mg  twice daily is the appropriate dose.  Refill approved.

## 2024-05-20 ENCOUNTER — Ambulatory Visit

## 2024-06-02 ENCOUNTER — Ambulatory Visit: Admitting: Family Medicine

## 2024-06-04 ENCOUNTER — Other Ambulatory Visit: Payer: Self-pay | Admitting: Family Medicine

## 2024-06-04 ENCOUNTER — Other Ambulatory Visit: Payer: Self-pay | Admitting: Cardiology

## 2024-06-13 ENCOUNTER — Ambulatory Visit (INDEPENDENT_AMBULATORY_CARE_PROVIDER_SITE_OTHER): Admitting: Adult Health

## 2024-06-13 ENCOUNTER — Other Ambulatory Visit: Payer: Self-pay | Admitting: Adult Health

## 2024-06-13 ENCOUNTER — Encounter: Payer: Self-pay | Admitting: Adult Health

## 2024-06-13 VITALS — BP 139/74 | HR 73 | Ht 64.0 in | Wt 164.0 lb

## 2024-06-13 DIAGNOSIS — Z90721 Acquired absence of ovaries, unilateral: Secondary | ICD-10-CM | POA: Diagnosis not present

## 2024-06-13 DIAGNOSIS — N939 Abnormal uterine and vaginal bleeding, unspecified: Secondary | ICD-10-CM | POA: Diagnosis not present

## 2024-06-13 DIAGNOSIS — Z90710 Acquired absence of both cervix and uterus: Secondary | ICD-10-CM | POA: Diagnosis not present

## 2024-06-13 DIAGNOSIS — C541 Malignant neoplasm of endometrium: Secondary | ICD-10-CM

## 2024-06-13 MED ORDER — IMVEXXY MAINTENANCE PACK 10 MCG VA INST: VAGINAL_INSERT | VAGINAL | Status: DC

## 2024-06-13 MED ORDER — IMVEXXY STARTER PACK 4 MCG VA INST: VAGINAL_INSERT | VAGINAL | 12 refills | Status: DC

## 2024-06-13 NOTE — Progress Notes (Signed)
  Subjective:     [name], female   DOB: 06/29/1946, 78 y.o.   MRN: [id number]  HPI Heather Barber is a 78 year old white female, widowed, sp hysterectomy and BSO for stage 1A grade 1 endometrial cancer in 2021, in complaining of dark vaginal bleeding twice last week. She is still working 12 hours at [employer]  PCP is Dr Dettinger  Review of Systems +vaginal bleeding twice last week Denies any pain Reviewed past medical,surgical, social and family history. Reviewed medications and allergies.     Objective:   Physical Exam BP 139/74 (BP Location: Left Arm, Patient Position: Sitting, Cuff Size: Normal)   Pulse 73   Ht 5' 4 (1.626 m)   Wt 164 lb (74.4 kg)   BMI 28.15 kg/m     Skin warm and dry.Pelvic: external genitalia is normal in appearance no lesions, vagina: pale, no lesions noted,urethra has no lesions or masses noted, cervix and uterus are absent, adnexa: no masses or tenderness noted. Bladder is non tender and no masses felt.   Upstream - 06/13/24 1105       Pregnancy Intention Screening   Does the patient want to become pregnant in the next year? N/A    Does the patient's partner want to become pregnant in the next year? N/A    Would the patient like to discuss contraceptive options today? N/A      Contraception Wrap Up   Current Method Abstinence;Female Sterilization   hyst   End Method Abstinence;Female Sterilization   hyst   Contraception Counseling Provided No         Examination chaperoned by Clarita Salt LPN  Assessment:     1. Vaginal bleeding (Primary) Vaginal bleeding twice last week, no lesions in vagina Discussed with Dr Jayne, pt using estrace  cream and is on Eliquis  for A fib Will try imvexxy  instead of estrace  vaginal cream Meds ordered this encounter  Medications   Estradiol  (IMVEXXY  MAINTENANCE PACK) 10 MCG INST    Sig: Place 1 insert in vagina for 2 weeks then use the 4 mg insert    Supervising Provider:   JAYNE MINDER H [[id number]]   Estradiol   Starter Pack (IMVEXXY  STARTER PACK) 4 MCG INST    Sig: Place 1 insert in vagina on Monday and Thursday    Dispense:  8 each    Refill:  12    Supervising Provider:   JAYNE MINDER H [[id number]]     2. Endometrial cancer, grade I ([name])  3. S/P hysterectomy with oophorectomy     Plan:     Foll woup in 4 weeks

## 2024-06-13 NOTE — Progress Notes (Deleted)
  Subjective:     [name], female   DOB: 07-24-1946, 78 y.o.   MRN: [id number]  HPI   Review of Systems     Objective:   Physical Exam     Assessment:     ***    Plan:     ***

## 2024-06-17 ENCOUNTER — Other Ambulatory Visit: Payer: Self-pay | Admitting: Adult Health

## 2024-06-18 ENCOUNTER — Other Ambulatory Visit: Payer: Self-pay | Admitting: Adult Health

## 2024-06-29 ENCOUNTER — Other Ambulatory Visit: Payer: Self-pay | Admitting: Cardiology

## 2024-07-07 ENCOUNTER — Ambulatory Visit: Admitting: Cardiology

## 2024-07-11 ENCOUNTER — Encounter: Payer: Self-pay | Admitting: Family Medicine

## 2024-07-11 ENCOUNTER — Ambulatory Visit: Admitting: Adult Health

## 2024-07-11 ENCOUNTER — Encounter: Payer: Self-pay | Admitting: Adult Health

## 2024-07-11 ENCOUNTER — Ambulatory Visit: Admitting: Family Medicine

## 2024-07-11 VITALS — BP 132/72 | HR 74 | Ht 64.0 in | Wt 164.0 lb

## 2024-07-11 VITALS — BP 123/52 | HR 69 | Ht 64.0 in | Wt 167.0 lb

## 2024-07-11 DIAGNOSIS — N939 Abnormal uterine and vaginal bleeding, unspecified: Secondary | ICD-10-CM | POA: Diagnosis not present

## 2024-07-11 DIAGNOSIS — I10 Essential (primary) hypertension: Secondary | ICD-10-CM | POA: Diagnosis not present

## 2024-07-11 DIAGNOSIS — Z23 Encounter for immunization: Secondary | ICD-10-CM

## 2024-07-11 DIAGNOSIS — E039 Hypothyroidism, unspecified: Secondary | ICD-10-CM | POA: Diagnosis not present

## 2024-07-11 DIAGNOSIS — E781 Pure hyperglyceridemia: Secondary | ICD-10-CM

## 2024-07-11 DIAGNOSIS — Z90721 Acquired absence of ovaries, unilateral: Secondary | ICD-10-CM | POA: Diagnosis not present

## 2024-07-11 DIAGNOSIS — C541 Malignant neoplasm of endometrium: Secondary | ICD-10-CM

## 2024-07-11 DIAGNOSIS — Z90710 Acquired absence of both cervix and uterus: Secondary | ICD-10-CM

## 2024-07-11 DIAGNOSIS — Z7984 Long term (current) use of oral hypoglycemic drugs: Secondary | ICD-10-CM

## 2024-07-11 DIAGNOSIS — E782 Mixed hyperlipidemia: Secondary | ICD-10-CM

## 2024-07-11 DIAGNOSIS — E1169 Type 2 diabetes mellitus with other specified complication: Secondary | ICD-10-CM

## 2024-07-11 LAB — LIPID PANEL

## 2024-07-11 LAB — BAYER DCA HB A1C WAIVED: HB A1C (BAYER DCA - WAIVED): 6.5 % — ABNORMAL HIGH (ref 4.8–5.6)

## 2024-07-11 MED ORDER — ATORVASTATIN CALCIUM 40 MG PO TABS: 40.0000 mg | ORAL_TABLET | Freq: Every day | ORAL | 3 refills | Status: AC

## 2024-07-11 MED ORDER — METFORMIN HCL 500 MG PO TABS: 500.0000 mg | ORAL_TABLET | Freq: Two times a day (BID) | ORAL | 3 refills | Status: AC

## 2024-07-11 MED ORDER — ESTRADIOL 0.1 MG/GM VA CREA: TOPICAL_CREAM | VAGINAL | 2 refills | Status: DC

## 2024-07-11 NOTE — Progress Notes (Signed)
  Subjective:     [name], female   DOB: February 14, 1946, 78 y.o.   MRN: [id number]  HPI Heather Barber is 78 year old white female, widowed, sp hysterectomy BSO for stage 1A grade 1 endometrial cancer in 2021,in for follow up in bleeding and has not had any more.  PCP is Dr Maryanne.   Review of Systems Has not had any more bleeding Reviewed past medical,surgical, social and family history. Reviewed medications and allergies.     Objective:   Physical Exam BP 132/72 (BP Location: Right Arm, Patient Position: Sitting, Cuff Size: Normal)   Pulse 74   Ht 5' 4 (1.626 m)   Wt 164 lb (74.4 kg)   BMI 28.15 kg/m     Skin warm and dry.Pelvic: external genitalia is normal in appearance no lesions, vagina: no lesions seen,urethra has no lesions or masses noted, cervix and uterus are absent, adnexa: no masses or tenderness noted. Bladder is non tender and no masses felt.  Fall risk is low  Upstream - 07/11/24 1132       Pregnancy Intention Screening   Does the patient want to become pregnant in the next year? N/A    Does the patient's partner want to become pregnant in the next year? N/A    Would the patient like to discuss contraceptive options today? N/A      Contraception Wrap Up   Current Method Female Sterilization   hyst   End Method Female Sterilization   hyst   Contraception Counseling Provided No         Examination chaperoned by Heather Salt LPN  Assessment:     1. Vaginal bleeding (Primary) Has not seen any more bleeding No lesions on vaginal exam  Refilled estrace  vaginal cream,insurance would not cover imvexxy  was going to be over $500  Meds ordered this encounter  Medications   estradiol  (ESTRACE  VAGINAL) 0.1 MG/GM vaginal cream    Sig: Use 1 gm in vagina at bedtime 2 x weekly    Dispense:  42.5 g    Refill:  2    Supervising Provider:   JAYNE MINDER H [[id number]]    2. Endometrial cancer, grade I ([name])  3. S/P hysterectomy with oophorectomy    Plan:      Follow up in 6 months

## 2024-07-11 NOTE — Progress Notes (Addendum)
 BP (!) 123/52   Pulse 69   Ht 5' 4 (1.626 m)   Wt 167 lb (75.8 kg)   SpO2 94%   BMI 28.67 kg/m    Subjective:   [name], female    DOB: 1946-08-14, 78 y.o.   MRN: [id number]  HPI: Heather Barber is a 78 y.o. female presenting on 07/11/2024 for Medical Management of Chronic Issues and Diabetes   Discussed the use of AI scribe software for clinical note transcription with the patient, who gave verbal consent to proceed.  History of Present Illness   Heather Barber is a 78 year old female with diabetes who presents for a recheck of her blood sugar levels.  Her blood sugar levels have been stable, with readings around 140 mg/dL, and the highest being 160 mg/dL. She continues to take metformin  for diabetes management.  She is on multiple medications for her cardiovascular conditions, including amiodarone , furosemide , metoprolol , Entresto , and Eliquis . She takes furosemide  twice a day to manage fluid retention, which manifests as mild swelling in her legs. Her weight is stable at 167 pounds.  She continues to take Lipitor and fenofibrate  for cholesterol management. No bleeding issues such as blood in stool.  She is on thyroid  medication at a dose of 75 mcg.  She remains active, working 12-hour shifts without experiencing shortness of breath. She believes staying active helps maintain her health.          Relevant past medical, surgical, family and social history reviewed and updated as indicated. Interim medical history since our last visit reviewed. Allergies and medications reviewed and updated.  Review of Systems  Constitutional:  Negative for chills and fever.  HENT:  Negative for congestion, ear discharge and ear pain.   Eyes:  Negative for redness and visual disturbance.  Respiratory:  Negative for chest tightness, shortness of breath and wheezing.   Cardiovascular:  Positive for leg swelling. Negative for chest pain.  Genitourinary:  Negative for  difficulty urinating and dysuria.  Musculoskeletal:  Negative for back pain and gait problem.  Skin:  Negative for rash.  Neurological:  Negative for dizziness, light-headedness and headaches.  Psychiatric/Behavioral:  Negative for agitation and behavioral problems.   All other systems reviewed and are negative.   Per HPI unless specifically indicated above   Allergies as of 07/11/2024       Reactions   Codeine Nausea And Vomiting   Morphine  Other (See Comments)   GI symptoms        Medication List        Accurate as of July 11, 2024  3:57 PM. If you have any questions, ask your nurse or doctor.          STOP taking these medications    Imvexxy  Maintenance Pack 10 MCG Inst Generic drug: Estradiol  Replaced by: estradiol  0.1 MG/GM vaginal cream Stopped by: Delon Lewis   Imvexxy  Starter Pack 4 MCG Inst Generic drug: Estradiol  Starter Pack Stopped by: Delon Lewis       TAKE these medications    Accu-Chek Guide test strip Generic drug: glucose blood 1 each by Other route daily. Use as instructed   Accu-Chek Softclix Lancets lancets 1 each by Other route daily at 12 noon. Use as instructed   amiodarone  200 MG tablet Commonly known as: PACERONE  TAKE 1/2 TABLET BY MOUTH DAILY   atorvastatin  40 MG tablet Commonly known as: LIPITOR Take 1 tablet (40 mg total) by mouth at bedtime.  CVS VITAMIN B12 1000 MCG tablet Generic drug: cyanocobalamin  Take 1,000 mcg by mouth daily.   Eliquis  5 MG Tabs tablet Generic drug: apixaban  TAKE 1 TABLET BY MOUTH TWICE A DAY   Entresto  24-26 MG Generic drug: sacubitril -valsartan  TAKE 1 TABLET BY MOUTH TWICE A DAY   estradiol  0.1 MG/GM vaginal cream Commonly known as: ESTRACE  VAGINAL Use 1 gm in vagina at bedtime 2 x weekly Replaces: Imvexxy  Maintenance Pack 10 MCG Inst Started by: Delon Lewis   fenofibrate  160 MG tablet TAKE 1 TABLET BY MOUTH EVERY DAY   ferrous sulfate  325 (65 FE) MG EC  tablet Take 1 tablet (325 mg total) by mouth daily with breakfast.   flavoxATE 100 MG tablet Commonly known as: URISPAS Take 100 mg by mouth every 6 (six) hours as needed.   furosemide  20 MG tablet Commonly known as: LASIX  TAKE 1 TABLET (20 MG TOTAL) BY MOUTH DAILY AS NEEDED (SWELLING).   hydroxyurea  500 MG capsule Commonly known as: HYDREA  Take 1 capsule (500 mg total) by mouth daily.   ketoconazole  2 % cream Commonly known as: NIZORAL  Apply 1 application topically 2 (two) times daily.   levothyroxine  75 MCG tablet Commonly known as: SYNTHROID  Take 1 tablet (75 mcg total) by mouth daily before breakfast.   metFORMIN  500 MG tablet Commonly known as: GLUCOPHAGE  Take 1 tablet (500 mg total) by mouth 2 (two) times daily with a meal.   methenamine  1 g tablet Commonly known as: HIPREX  TAKE 1 TABLET (1 G TOTAL) BY MOUTH 2 (TWO) TIMES DAILY WITH A MEAL.   metoprolol  succinate 50 MG 24 hr tablet Commonly known as: TOPROL -XL Take 1.5 tablets (75 mg total) by mouth daily. Take with or immediately following a meal.   Multivitamin Women 50+ Tabs Take by mouth daily.   naproxen sodium 220 MG tablet Commonly known as: ALEVE Take 220 mg by mouth as needed.   omeprazole  20 MG capsule Commonly known as: PRILOSEC TAKE 1 CAPSULE BY MOUTH EVERY DAY         Objective:   BP (!) 123/52   Pulse 69   Ht 5' 4 (1.626 m)   Wt 167 lb (75.8 kg)   SpO2 94%   BMI 28.67 kg/m   Wt Readings from Last 3 Encounters:  07/11/24 167 lb (75.8 kg)  07/11/24 164 lb (74.4 kg)  06/13/24 164 lb (74.4 kg)    Physical Exam Vitals and nursing note reviewed.  Constitutional:      General: She is not in acute distress.    Appearance: She is well-developed. She is not diaphoretic.  Eyes:     Conjunctiva/sclera: Conjunctivae normal.  Cardiovascular:     Rate and Rhythm: Normal rate and regular rhythm.     Heart sounds: Normal heart sounds. No murmur heard. Pulmonary:     Effort: Pulmonary  effort is normal. No respiratory distress.     Breath sounds: Normal breath sounds. No wheezing or rhonchi.  Musculoskeletal:        General: Swelling (Trace) present. No tenderness. Normal range of motion.  Skin:    General: Skin is warm and dry.     Findings: No rash.  Neurological:     Mental Status: She is alert and oriented to person, place, and time.     Coordination: Coordination normal.  Psychiatric:        Behavior: Behavior normal.    Physical Exam   VITALS: BP- 123/52 MEASUREMENTS: Weight- 167. CHEST: Lungs clear to auscultation bilaterally. CARDIOVASCULAR:  Heart regular rate and rhythm.         Assessment & Plan:   Problem List Items Addressed This Visit       Cardiovascular and Mediastinum   Hypertension   Relevant Medications   atorvastatin  (LIPITOR) 40 MG tablet   Other Relevant Orders   Bayer DCA Hb A1c Waived   Lipid panel   Vitamin B12   CMP14+EGFR   CBC with Differential/Platelet     Endocrine   Type 2 diabetes mellitus with other specified complication ([name]) - Primary   Relevant Medications   atorvastatin  (LIPITOR) 40 MG tablet   metFORMIN  (GLUCOPHAGE ) 500 MG tablet   Other Relevant Orders   Bayer DCA Hb A1c Waived   Lipid panel   Vitamin B12   CMP14+EGFR   CBC with Differential/Platelet   Hypothyroidism   Relevant Orders   TSH     Other   Hyperlipidemia   Relevant Medications   atorvastatin  (LIPITOR) 40 MG tablet   Other Relevant Orders   Bayer DCA Hb A1c Waived   Lipid panel   Vitamin B12   CMP14+EGFR   CBC with Differential/Platelet   Hypertriglyceridemia   Relevant Medications   atorvastatin  (LIPITOR) 40 MG tablet        Heart failure Well-managed on amiodarone , furosemide , metoprolol , and Entresto . Mild fluid retention managed with furosemide . - Continue amiodarone , furosemide , metoprolol , and Entresto .  Atrial fibrillation On amiodarone  and Eliquis . Cardiologist follow-up scheduled. - Continue amiodarone  and  Eliquis . - Follow up with cardiologist on September 17th.  Essential hypertension Blood pressure well-controlled at 123/52 mmHg. - Continue current antihypertensive regimen.  Type 2 diabetes mellitus Blood glucose levels well-controlled with metformin . - Continue metformin . - Check HbA1c level.  A1c was 6.5, looks good  Mixed hyperlipidemia Continuing on Lipitor and fenofibrate . Awaiting cholesterol levels. - Continue Lipitor and fenofibrate . - Check cholesterol levels.  Hypothyroidism Well-managed on levothyroxine  75 mcg. Awaiting thyroid  function tests. - Continue levothyroxine  75 mcg. - Check thyroid  function tests.          Follow up plan: Return in about 3 months (around 10/10/2024), or if symptoms worsen or fail to improve, for Diabetes.  Counseling provided for all of the vaccine components Orders Placed This Encounter  Procedures   Bayer DCA Hb A1c Waived   Lipid panel   Vitamin B12   CMP14+EGFR   CBC with Differential/Platelet   TSH    Fonda Levins, MD [facility] 07/11/2024, 3:57 PM

## 2024-07-12 LAB — CBC WITH DIFFERENTIAL/PLATELET
Basophils Absolute: 0 x10E3/uL (ref 0.0–0.2)
Basos: 1 %
EOS (ABSOLUTE): 0.1 x10E3/uL (ref 0.0–0.4)
Eos: 2 %
Hematocrit: 34.7 % (ref 34.0–46.6)
Hemoglobin: 11.9 g/dL (ref 11.1–15.9)
Immature Grans (Abs): 0 x10E3/uL (ref 0.0–0.1)
Immature Granulocytes: 0 %
Lymphocytes Absolute: 1.9 x10E3/uL (ref 0.7–3.1)
Lymphs: 33 %
MCH: 36.5 pg — ABNORMAL HIGH (ref 26.6–33.0)
MCHC: 34.3 g/dL (ref 31.5–35.7)
MCV: 106 fL — ABNORMAL HIGH (ref 79–97)
Monocytes Absolute: 0.4 x10E3/uL (ref 0.1–0.9)
Monocytes: 7 %
Neutrophils Absolute: 3.5 x10E3/uL (ref 1.4–7.0)
Neutrophils: 57 %
Platelets: 382 x10E3/uL (ref 150–450)
RBC: 3.26 x10E6/uL — ABNORMAL LOW (ref 3.77–5.28)
RDW: 12 % (ref 11.7–15.4)
WBC: 6 x10E3/uL (ref 3.4–10.8)

## 2024-07-12 LAB — CMP14+EGFR
ALT: 14 IU/L (ref 0–32)
AST: 12 IU/L (ref 0–40)
Albumin: 4.4 g/dL (ref 3.8–4.8)
Alkaline Phosphatase: 49 IU/L (ref 44–121)
BUN/Creatinine Ratio: 23 (ref 12–28)
BUN: 25 mg/dL (ref 8–27)
Bilirubin Total: 0.2 mg/dL (ref 0.0–1.2)
CO2: 27 mmol/L (ref 20–29)
Calcium: 9.9 mg/dL (ref 8.7–10.3)
Chloride: 98 mmol/L (ref 96–106)
Creatinine, Ser: 1.1 mg/dL — AB (ref 0.57–1.00)
Globulin, Total: 2.1 g/dL (ref 1.5–4.5)
Glucose: 248 mg/dL — AB (ref 70–99)
Potassium: 4.8 mmol/L (ref 3.5–5.2)
Sodium: 139 mmol/L (ref 134–144)
Total Protein: 6.5 g/dL (ref 6.0–8.5)
eGFR: 51 mL/min/1.73 — AB (ref >59)

## 2024-07-12 LAB — LIPID PANEL
Cholesterol, Total: 178 mg/dL (ref 100–199)
HDL: 47 mg/dL (ref >39)
LDL CALC COMMENT:: 3.8 ratio (ref 0.0–4.4)
LDL Chol Calc (NIH): 91 mg/dL (ref 0–99)
Triglycerides: 240 mg/dL — AB (ref 0–149)
VLDL Cholesterol Cal: 40 mg/dL (ref 5–40)

## 2024-07-12 LAB — VITAMIN B12: Vitamin B-12: 1162 pg/mL (ref 232–1245)

## 2024-07-12 LAB — TSH: TSH: 1.74 u[IU]/mL (ref 0.450–4.500)

## 2024-07-13 ENCOUNTER — Other Ambulatory Visit: Payer: Self-pay | Admitting: Family Medicine

## 2024-07-13 DIAGNOSIS — D473 Essential (hemorrhagic) thrombocythemia: Secondary | ICD-10-CM

## 2024-07-16 ENCOUNTER — Ambulatory Visit: Attending: Cardiology | Admitting: Cardiology

## 2024-07-16 ENCOUNTER — Encounter: Payer: Self-pay | Admitting: Cardiology

## 2024-07-16 VITALS — BP 118/72 | HR 67 | Ht 64.0 in | Wt 166.4 lb

## 2024-07-16 DIAGNOSIS — E782 Mixed hyperlipidemia: Secondary | ICD-10-CM | POA: Insufficient documentation

## 2024-07-16 DIAGNOSIS — I4891 Unspecified atrial fibrillation: Secondary | ICD-10-CM | POA: Insufficient documentation

## 2024-07-16 DIAGNOSIS — I5032 Chronic diastolic (congestive) heart failure: Secondary | ICD-10-CM | POA: Diagnosis present

## 2024-07-16 DIAGNOSIS — I10 Essential (primary) hypertension: Secondary | ICD-10-CM | POA: Insufficient documentation

## 2024-07-16 DIAGNOSIS — I480 Paroxysmal atrial fibrillation: Secondary | ICD-10-CM | POA: Insufficient documentation

## 2024-07-16 NOTE — Patient Instructions (Signed)

## 2024-07-16 NOTE — Progress Notes (Signed)
 Clinical Summary Heather Barber is a 78 y.o.female seen today for follow up of the following medical problems.      1.Afib vs atypical aflutter with variable conduction - new diagnosis during 02/2021 admission - difficult to rate control, had TEE/DCCV initially but quickly had recurrent arrhythmia - started on amio, converted to SR -  TSH was elevated to 28 in 11/2021 which led to dose reduction of Amiodarone  to 100 mg daily. She was also started on Synthroid  by her PCP     - no recent palpitations - compliant with meds. No bleeding on eliquis .     2. HFimpEF - new diagnosis during 02/2021 in setting of afib with RVR - 02/2021 echo LVEF 30-35% -likely tachy mediated CM   05/2021 echo: LVEF 55-60% 05/2023 LVEF 60-65%, grade I dd, mild MR, mod TR - - some LE edemea  that has increased. No specific SOb/DOE    - some swelling in feet - flu in January, severe course. Given prednisone  course by pcp. Some increased LE edema since that time, some lingering SOB and fatigue. Last prednisone  was just yesterday   - some LE edema noted at 02/2024 appt with PA Sheron, was to increase lasix  to 20mg  alternating days with 40mg .  - swelling has improved. No SOB/DOE. Taking lasix  20mg  bid.      3. Mitral regurgitation - 05/2021 mod MR, mod TR 05/2023 LVEF 60-65%, grade I dd, mild MR, mod TR     4. Hyopothyroidism - issues developed after starting amiodarone  - followed by pcp   5. HLD - 03/2023 TC 168 TG 177 HDL 44 LDL 93 - Jan 2025 TC 146 TG 151 HDL 41 LDL 79 - 06/2024 TC 821 TG 759 HDL 47 LDL 91. Working on diet changes.      6. 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 Still working  Past Medical History:  Diagnosis Date   (HFpEF) heart failure with preserved ejection fraction ([name])    a. EF previously 30-35% in 02/2021 in the setting of afib w/RVR --> normalized by repeat imaging in 05/2021.   Anxiety    Atrial flutter ([name])    a. s/p DCCV in  02/2021   Atrial septal defect    Surgical repair in 1994   Cardiomyopathy [facility])    Chest pain    Normal coronary angiography in 1994 and 2011   Dizziness    Endometrial cancer, grade I ([name]) 10/09/2019   Essential thrombocythemia ([name]) 06/06/2016   GERD (gastroesophageal reflux disease)    History of kidney stones    Hyperlipidemia    Hypertension    LBBB (left bundle Bryce Cheever block) 2011   Mitral regurgitation    Palpitations    Persistent atrial fibrillation ([name])    PONV (postoperative nausea and vomiting)    Syncope    Tricuspid regurgitation    Type 2 diabetes mellitus with other specified complication ([name]) 07/18/2019   Ventricular tachycardia ([name])    Exercise induced     Allergies  Allergen Reactions   Codeine Nausea And Vomiting   Morphine  Other (See Comments)    GI symptoms     Current Outpatient Medications  Medication Sig Dispense Refill   Accu-Chek Softclix Lancets lancets 1 each by Other route daily at 12 noon. Use as instructed 90 each 3   amiodarone  (PACERONE ) 200 MG tablet TAKE 1/2 TABLET BY MOUTH DAILY 45 tablet 2   atorvastatin  (LIPITOR)  40 MG tablet Take 1 tablet (40 mg total) by mouth at bedtime. 90 tablet 3   CVS VITAMIN B12 1000 MCG tablet Take 1,000 mcg by mouth daily.     ELIQUIS  5 MG TABS tablet TAKE 1 TABLET BY MOUTH TWICE A DAY 60 tablet 5   ENTRESTO  24-26 MG TAKE 1 TABLET BY MOUTH TWICE A DAY 180 tablet 2   estradiol  (ESTRACE  VAGINAL) 0.1 MG/GM vaginal cream Use 1 gm in vagina at bedtime 2 x weekly 42.5 g 2   fenofibrate  160 MG tablet TAKE 1 TABLET BY MOUTH EVERY DAY 90 tablet 3   ferrous sulfate  325 (65 FE) MG EC tablet Take 1 tablet (325 mg total) by mouth daily with breakfast. 100 tablet 3   flavoxATE (URISPAS) 100 MG tablet Take 100 mg by mouth every 6 (six) hours as needed.     furosemide  (LASIX ) 20 MG tablet TAKE 1 TABLET (20 MG TOTAL) BY MOUTH DAILY AS NEEDED (SWELLING). 90 tablet 2   glucose blood (ACCU-CHEK GUIDE) test strip 1 each  by Other route daily. Use as instructed 90 each 3   hydroxyurea  (HYDREA ) 500 MG capsule Take 1 capsule (500 mg total) by mouth daily. 90 capsule 3   ketoconazole  (NIZORAL ) 2 % cream Apply 1 application topically 2 (two) times daily. 60 g 0   levothyroxine  (SYNTHROID ) 75 MCG tablet Take 1 tablet (75 mcg total) by mouth daily before breakfast. 90 tablet 2   metFORMIN  (GLUCOPHAGE ) 500 MG tablet Take 1 tablet (500 mg total) by mouth 2 (two) times daily with a meal. 180 tablet 3   methenamine  (HIPREX ) 1 g tablet TAKE 1 TABLET (1 G TOTAL) BY MOUTH 2 (TWO) TIMES DAILY WITH A MEAL. 180 tablet 3   metoprolol  succinate (TOPROL -XL) 50 MG 24 hr tablet Take 1.5 tablets (75 mg total) by mouth daily. Take with or immediately following a meal. 135 tablet 3   Multiple Vitamins-Minerals (MULTIVITAMIN WOMEN 50+) TABS Take by mouth daily.     naproxen sodium (ALEVE) 220 MG tablet Take 220 mg by mouth as needed.     omeprazole  (PRILOSEC) 20 MG capsule TAKE 1 CAPSULE BY MOUTH EVERY DAY 90 capsule 3   No current facility-administered medications for this visit.     Past Surgical History:  Procedure Laterality Date   APPENDECTOMY     ASD REPAIR  1994   [facility]   BREAST EXCISIONAL BIOPSY     Right and left   BREAST SURGERY     right and left breast-benign   CARDIAC CATHETERIZATION  12/24/2009   Dr. Wonda    CARDIOVERSION N/A 03/04/2021   Procedure: CARDIOVERSION;  Surgeon: Alvan Dorn FALCON, MD;  Location: [facility];  Service: Endoscopy;  Laterality: N/A;   CATARACT EXTRACTION W/PHACO Left 03/11/2013   Procedure: CATARACT EXTRACTION PHACO AND INTRAOCULAR LENS PLACEMENT (IOC);  Surgeon: Oneil T. Roz, MD;  Location: [facility];  Service: Ophthalmology;  Laterality: Left;  CDE:  12.20   HYSTEROSCOPY WITH D & C N/A 11/11/2019   Procedure: DILATATION AND CURETTAGE /HYSTEROSCOPY;  Surgeon: Edsel Norleen GAILS, MD;  Location: [facility];  Service: Gynecology;  Laterality: N/A;   POLYPECTOMY N/A 11/11/2019    Procedure: POLYPECTOMY(ENDOMETRIAL POLYP);  Surgeon: Edsel Norleen GAILS, MD;  Location: [facility];  Service: Gynecology;  Laterality: N/A;   ROBOTIC ASSISTED LAPAROSCOPIC HYSTERECTOMY AND SALPINGECTOMY Bilateral 12/09/2019   Procedure: XI ROBOTIC ASSISTED LAPAROSCOPIC HYSTERECTOMY BILATERAL SALPINGOOOPHORECTOMY;  Surgeon: Eloy Herring, MD;  Location: [facility];  Service:  Gynecology;  Laterality: Bilateral;   SENTINEL NODE BIOPSY N/A 12/09/2019   Procedure: SENTINEL LYMPH  NODE BIOPSY;  Surgeon: Eloy Herring, MD;  Location: [facility];  Service: Gynecology;  Laterality: N/A;   TEE WITHOUT CARDIOVERSION N/A 03/04/2021   Procedure: TRANSESOPHAGEAL ECHOCARDIOGRAM (TEE);  Surgeon: Alvan Dorn FALCON, MD;  Location: [facility];  Service: Endoscopy;  Laterality: N/A;   TONSILLECTOMY     TUBAL LIGATION     Bilateral     Allergies  Allergen Reactions   Codeine Nausea And Vomiting   Morphine  Other (See Comments)    GI symptoms      Family History  Problem Relation Age of Onset   Cancer Mother        female   Heart failure Father    Stroke Sister    Cancer Sister        breast   Coronary artery disease Neg Hx      Social History Heather Barber reports that she has never smoked. She has never used smokeless tobacco. Heather Barber reports no history of alcohol use.    Physical Examination Today's Vitals   07/16/24 1102  BP: 118/72  Pulse: 67  SpO2: 98%  Weight: 166 lb 6.4 oz (75.5 kg)  Height: 5' 4 (1.626 m)   Body mass index is 28.56 kg/m.  Gen: resting comfortably, no acute distress HEENT: no scleral icterus, pupils equal round and reactive, no palptable cervical adenopathy,  CV: RRR, no m/rg, no jvd Resp: Clear to auscultation bilaterally GI: abdomen is soft, non-tender, non-distended, normal bowel sounds, no hepatosplenomegaly MSK: extremities are warm, no edema.  Skin: warm, no rash Neuro:  no focal deficits Psych: appropriate affect    Assessment and Plan   1.Paroxsymal afib vs atypical  aflutter/acquired thrombophilia -denies any symptoms, EKG today shows NSR - continue current meds including eliquis  for stroke prevention     2. HFimpEF - history of tachy mediated CM, LVEF normalized with control of her arrhythmias - euvolemic without symptoms, continue current meds   3. HTN - she is at goal, continue current meds  4. HLD - LDL at goal, discussed dietary changes to lower TGs     Dorn PHEBE Alvan, M.D.

## 2024-07-17 ENCOUNTER — Ambulatory Visit: Payer: Self-pay | Admitting: Family Medicine

## 2024-07-20 ENCOUNTER — Other Ambulatory Visit: Payer: Self-pay | Admitting: Physician Assistant

## 2024-07-20 DIAGNOSIS — E538 Deficiency of other specified B group vitamins: Secondary | ICD-10-CM

## 2024-08-13 ENCOUNTER — Inpatient Hospital Stay: Admitting: Physician Assistant

## 2024-09-01 ENCOUNTER — Encounter: Payer: Self-pay | Admitting: Nurse Practitioner

## 2024-09-01 ENCOUNTER — Ambulatory Visit: Admitting: Nurse Practitioner

## 2024-09-01 VITALS — BP 115/74 | HR 76 | Temp 98.4°F | Ht 64.0 in | Wt 163.2 lb

## 2024-09-01 DIAGNOSIS — H11002 Unspecified pterygium of left eye: Secondary | ICD-10-CM

## 2024-09-01 NOTE — Progress Notes (Signed)
   Subjective:    [name], female    DOB: 12/04/1945, 78 y.o.   MRN: [id number]   Chief Complaint: Conjunctivitis (Left eye)   Conjunctivitis  The current episode started more than 1 week ago. The onset was gradual. The problem occurs continuously. The problem is mild. Nothing relieves the symptoms. Pertinent negatives include no fever, no eye itching, no eye discharge and no eye pain. The eye pain is not associated with movement. The eyelid exhibits no abnormality.    Patient Active Problem List   Diagnosis Date Noted   S/P hysterectomy with oophorectomy 07/11/2024   Vaginal bleeding 07/11/2024   Encounter for screening fecal occult blood testing 03/29/2023   Urinary frequency 03/29/2023   Hematuria 03/29/2023   Visit for pelvic exam 09/14/2022   Hypothyroidism 07/06/2022   A-fib ([name]) 03/01/2021   Hypertriglyceridemia 09/16/2020   Endometrial cancer, grade I ([name]) 10/09/2019   PMB (postmenopausal bleeding) 10/09/2019   Type 2 diabetes mellitus with other specified complication ([name]) 07/18/2019   SVT (supraventricular tachycardia) 05/21/2019   Gastroesophageal reflux disease 07/24/2016   Thrombocytosis 06/06/2016   LBBB (left bundle branch block)    Ventricular tachycardia ([name])    ASD (atrial septal defect)    Hypertension    Hyperlipidemia 04/27/2010       Review of Systems  Constitutional:  Negative for fever.  Eyes:  Negative for pain, discharge and itching.       Objective:   Physical Exam Constitutional:      Appearance: Normal appearance.  Eyes:     Extraocular Movements: Extraocular movements intact.     Pupils: Pupils are equal, round, and reactive to light.     Comments: Scleral injection Pterygium- left eye   Cardiovascular:     Rate and Rhythm: Normal rate and regular rhythm.     Heart sounds: Normal heart sounds.  Pulmonary:     Breath sounds: Normal breath sounds.  Skin:    General: Skin is warm.  Neurological:      General: No focal deficit present.     Mental Status: She is alert and oriented to person, place, and time.  Psychiatric:        Mood and Affect: Mood normal.        Behavior: Behavior normal.    BP 115/74   Pulse 76   Temp 98.4 F (36.9 C)   Ht 5' 4 (1.626 m)   Wt 163 lb 3.2 oz (74 kg)   SpO2 95%   BMI 28.01 kg/m         Assessment & Plan:  Heather Barber in today with chief complaint of pterygium   1. Pterygium of left eye (Primary) Referred to eye doctor Keep moist Avoid rubbing RTO prn   The above assessment and management plan was discussed with the patient. The patient verbalized understanding of and has agreed to the management plan. Patient is aware to call the clinic if symptoms persist or worsen. Patient is aware when to return to the clinic for a follow-up visit. Patient educated on when it is appropriate to go to the emergency department.   Mary-Margaret Gladis, FNP

## 2024-09-01 NOTE — Patient Instructions (Signed)
 Pterygium Excision, Care After The following information offers guidance on how to care for yourself after your procedure. Your health care provider may also give you more specific instructions. If you have problems or questions, contact your health care provider. What can I expect after the procedure? After the procedure, it is common to have: Eye discomfort or pain for several days to weeks. Watering eye (tearing). Redness. Eyelid swelling. A feeling like there is something in the eye. Vision that is often blurry for a few days to a few weeks. Follow these instructions at home: Eye care  Wear your eye shield as told by your health care provider. Do not remove it until your health care provider tells you to do that. Do not rub your eye, even after your eye shield is off. Use eye drops or eye ointment as told by your health care provider. To prevent your pterygium from coming back: Wear wrap-around sunglasses to protect your eyes from the sun. This type of sunglasses protects not only the front but also the sides of your eyes from sunlight. Wear eyeglasses to protect your eyes from windy and dusty conditions. Use lubricating eye drops in dry conditions. Activity For the first few days after surgery, avoid activities that require a lot of energy (are strenuous) as told by your health care provider. Ask your health care provider what activities are safe for you. Do not lift anything that is heavier than 10 lb (4.5 kg) until your health care provider says that it is safe. General instructions Take over-the-counter and prescription medicines only as told by your health care provider. Do not drive or use machinery until your health care provider says that it is safe. Do not take baths, swim, or use a hot tub until your health care provider approves. You may shower as usual. Keep all follow-up visits. This is important. Contact a health care provider if: You have a fever. You have pain that  does not get better with medicine. You have a gritty feeling in your eye or a feeling like something is in your eye, and this does not go away after a few weeks. Your vision seems to be changing over time. Get help right away if: You have eye pain that gets worse. Your eye becomes more red and swollen. You have a sudden change in your vision. You have pus coming from your eye. Summary After the procedure, it is common to have eye discomfort, tearing, and a feeling like there is something in your eye. Wear your eye shield as told by your health care provider. Do not take baths, swim, or use a hot tub until your health care provider approves. This information is not intended to replace advice given to you by your health care provider. Make sure you discuss any questions you have with your health care provider. Document Revised: 06/09/2021 Document Reviewed: 06/09/2021 Elsevier Patient Education  2024 [employer].

## 2024-09-03 ENCOUNTER — Other Ambulatory Visit: Payer: Self-pay | Admitting: Cardiology

## 2024-09-03 ENCOUNTER — Other Ambulatory Visit: Payer: Self-pay | Admitting: *Deleted

## 2024-09-03 DIAGNOSIS — K219 Gastro-esophageal reflux disease without esophagitis: Secondary | ICD-10-CM

## 2024-09-04 ENCOUNTER — Other Ambulatory Visit: Payer: Self-pay | Admitting: Cardiology

## 2024-09-04 ENCOUNTER — Encounter: Payer: Self-pay | Admitting: *Deleted

## 2024-10-01 ENCOUNTER — Inpatient Hospital Stay: Admitting: Physician Assistant

## 2024-10-09 ENCOUNTER — Ambulatory Visit: Admitting: Family Medicine

## 2024-10-09 ENCOUNTER — Encounter: Payer: Self-pay | Admitting: Family Medicine

## 2024-10-09 VITALS — BP 126/64 | HR 69 | Temp 98.0°F | Ht 64.0 in | Wt 169.0 lb

## 2024-10-09 DIAGNOSIS — E1169 Type 2 diabetes mellitus with other specified complication: Secondary | ICD-10-CM

## 2024-10-09 DIAGNOSIS — E039 Hypothyroidism, unspecified: Secondary | ICD-10-CM | POA: Diagnosis not present

## 2024-10-09 DIAGNOSIS — I5020 Unspecified systolic (congestive) heart failure: Secondary | ICD-10-CM | POA: Insufficient documentation

## 2024-10-09 DIAGNOSIS — I152 Hypertension secondary to endocrine disorders: Secondary | ICD-10-CM

## 2024-10-09 DIAGNOSIS — Z7984 Long term (current) use of oral hypoglycemic drugs: Secondary | ICD-10-CM | POA: Diagnosis not present

## 2024-10-09 DIAGNOSIS — E1159 Type 2 diabetes mellitus with other circulatory complications: Secondary | ICD-10-CM | POA: Diagnosis not present

## 2024-10-09 DIAGNOSIS — E785 Hyperlipidemia, unspecified: Secondary | ICD-10-CM | POA: Diagnosis not present

## 2024-10-09 DIAGNOSIS — E781 Pure hyperglyceridemia: Secondary | ICD-10-CM | POA: Diagnosis not present

## 2024-10-09 LAB — BAYER DCA HB A1C WAIVED: HB A1C (BAYER DCA - WAIVED): 6.6 % — ABNORMAL HIGH (ref 4.8–5.6)

## 2024-10-09 NOTE — Progress Notes (Signed)
 BP 126/64   Pulse 69   Temp 98 F (36.7 C)   Ht 5' 4 (1.626 m)   Wt 169 lb (76.7 kg)   SpO2 97%   BMI 29.01 kg/m    Subjective:   [name], female    DOB: [birth year], 78 y.o.   MRN: [id number]  HPI: Heather Barber is a 78 y.o. female presenting on 10/09/2024 for Medical Management of Chronic Issues and Hospitalization Follow-up   Discussed the use of AI scribe software for clinical note transcription with the patient, who gave verbal consent to proceed.  History of Present Illness   Heather Barber is a 78 year old female with hypertension, diabetes, and atrial fibrillation who presents for a recheck of her conditions.  Glycemic control - Blood glucose levels stable with occasional elevations, typically around 120 mg/dL - Continues metformin  therapy without adverse effects  Thyroid  function - Continues levothyroxine  therapy - No side effects or issues with thyroid  medication  Cardiac status - Undergoes cardiology follow-up every six months for atrial fibrillation and heart failure - No current symptoms or complications related to heart medications - Cardiac condition described as stable by cardiologist  Peripheral edema - Minimal leg swelling, primarily after prolonged standing - Continues diuretic therapy for symptom management  Fatigue - Persistent fatigue - Inquires about over-the-counter options to improve energy - Currently takes 1000 mcg of vitamin B12 daily          Relevant past medical, surgical, family and social history reviewed and updated as indicated. Interim medical history since our last visit reviewed. Allergies and medications reviewed and updated.  Review of Systems  Constitutional:  Negative for chills and fever.  Eyes:  Negative for visual disturbance.  Respiratory:  Negative for chest tightness and shortness of breath.   Cardiovascular:  Negative for chest pain and leg swelling.  Genitourinary:  Negative for difficulty  urinating and dysuria.  Musculoskeletal:  Negative for back pain and gait problem.  Skin:  Negative for rash.  Neurological:  Negative for dizziness, light-headedness and headaches.  Psychiatric/Behavioral:  Negative for agitation and behavioral problems.   All other systems reviewed and are negative.   Per HPI unless specifically indicated above   Allergies as of 10/09/2024       Reactions   Codeine Nausea And Vomiting   Morphine  Other (See Comments)   GI symptoms        Medication List        Accurate as of October 09, 2024  3:19 PM. If you have any questions, ask your nurse or doctor.          Accu-Chek Guide test strip Generic drug: glucose blood 1 each by Other route daily. Use as instructed   Accu-Chek Softclix Lancets lancets 1 each by Other route daily at 12 noon. Use as instructed   amiodarone  200 MG tablet Commonly known as: PACERONE  TAKE 1/2 TABLET BY MOUTH DAILY   atorvastatin  40 MG tablet Commonly known as: LIPITOR Take 1 tablet (40 mg total) by mouth at bedtime.   [employer]S VITAMIN B12 1000 MCG tablet Generic drug: cyanocobalamin  TAKE 1 TABLET BY MOUTH EVERY DAY   Eliquis  5 MG Tabs tablet Generic drug: apixaban  TAKE 1 TABLET BY MOUTH TWICE A DAY   Entresto  24-26 MG Generic drug: sacubitril -valsartan  TAKE 1 TABLET BY MOUTH TWICE A DAY   estradiol  0.1 MG/GM vaginal cream Commonly known as: ESTRACE  VAGINAL Use 1 gm in vagina at bedtime 2  x weekly   fenofibrate  160 MG tablet TAKE 1 TABLET BY MOUTH EVERY DAY   ferrous sulfate  325 (65 FE) MG EC tablet Take 1 tablet (325 mg total) by mouth daily with breakfast.   flavoxATE 100 MG tablet Commonly known as: URISPAS Take 100 mg by mouth every 6 (six) hours as needed.   furosemide  20 MG tablet Commonly known as: LASIX  TAKE 1 TABLET (20 MG TOTAL) BY MOUTH DAILY AS NEEDED (SWELLING).   hydroxyurea  500 MG capsule Commonly known as: HYDREA  Take 1 capsule (500 mg total) by mouth daily.    ketoconazole  2 % cream Commonly known as: NIZORAL  Apply 1 application topically 2 (two) times daily.   levothyroxine  75 MCG tablet Commonly known as: SYNTHROID  Take 1 tablet (75 mcg total) by mouth daily before breakfast.   metFORMIN  500 MG tablet Commonly known as: GLUCOPHAGE  Take 1 tablet (500 mg total) by mouth 2 (two) times daily with a meal.   methenamine  1 g tablet Commonly known as: HIPREX  TAKE 1 TABLET (1 G TOTAL) BY MOUTH 2 (TWO) TIMES DAILY WITH A MEAL.   metoprolol  succinate 50 MG 24 hr tablet Commonly known as: TOPROL -XL TAKE 1 AND 1/2 TABLETS DAILY (75 MG TOTAL) BY MOUTH WITH FOOD OR IMMEDIATELY AFTER MEALS   Multivitamin Women 50+ Tabs Take by mouth daily.   naproxen sodium 220 MG tablet Commonly known as: ALEVE Take 220 mg by mouth as needed.   omeprazole  20 MG capsule Commonly known as: PRILOSEC TAKE 1 CAPSULE BY MOUTH EVERY DAY         Objective:   BP 126/64   Pulse 69   Temp 98 F (36.7 C)   Ht 5' 4 (1.626 m)   Wt 169 lb (76.7 kg)   SpO2 97%   BMI 29.01 kg/m   Wt Readings from Last 3 Encounters:  10/09/24 169 lb (76.7 kg)  09/01/24 163 lb 3.2 oz (74 kg)  07/16/24 166 lb 6.4 oz (75.5 kg)    Physical Exam Physical Exam   VITALS: BP- 126/64 CHEST: Lungs clear to auscultation bilaterally. CARDIOVASCULAR: Regular heart rate and rhythm. Systolic murmur present. EXTREMITIES: Minimal edema in lower extremities.         Assessment & Plan:   Problem List Items Addressed This Visit       Cardiovascular and Mediastinum   Hypertension associated with diabetes ([name])   Heart failure with improved ejection fraction (HFimpEF) ([name])     Endocrine   Hyperlipidemia associated with type 2 diabetes mellitus ([name])   Type 2 diabetes mellitus with other specified complication ([name]) - Primary   Relevant Orders   Bayer DCA Hb A1c Waived   Vitamin B12   Hypothyroidism     Other   Hypertriglyceridemia       Type 2 diabetes mellitus with other  specified complication Blood sugar levels well-controlled at 120 mg/dL. Continues on metformin  without issues. - Continue metformin  as prescribed. - A1c 6.6, looks good, no changes  Heart failure with improved ejection fraction Heart failure well-managed with improved ejection fraction. Minimal peripheral edema. Regular cardiology follow-up every six months. - Continue current heart failure management regimen. - Continue diuretics as prescribed.  Atrial fibrillation Well-controlled with current medication regimen. Regular cardiology follow-up every six months. - Continue current atrial fibrillation management regimen.  Acquired hypothyroidism Well-managed with levothyroxine . No side effects reported. - Continue levothyroxine  as prescribed.  General health maintenance Discussed vitamin D supplementation benefits for energy and bone health. - Recommended over-the-counter vitamin  D supplementation.          Follow up plan: Return in about 3 months (around 01/07/2025), or if symptoms worsen or fail to improve, for Diabetes.  Counseling provided for all of the vaccine components Orders Placed This Encounter  Procedures   Bayer DCA Hb A1c Waived   Vitamin B12    Fonda Levins, MD [facility] 10/09/2024, 3:19 PM

## 2024-10-10 ENCOUNTER — Ambulatory Visit: Payer: Self-pay | Admitting: Family Medicine

## 2024-10-10 LAB — VITAMIN B12: Vitamin B-12: 1294 pg/mL — ABNORMAL HIGH (ref 232–1245)

## 2024-10-15 ENCOUNTER — Ambulatory Visit: Payer: Self-pay | Admitting: Family Medicine

## 2024-10-23 ENCOUNTER — Other Ambulatory Visit: Payer: Self-pay

## 2024-10-23 ENCOUNTER — Emergency Department (HOSPITAL_COMMUNITY)

## 2024-10-23 ENCOUNTER — Inpatient Hospital Stay (HOSPITAL_COMMUNITY)
Admission: EM | Admit: 2024-10-23 | Discharge: 2024-10-25 | DRG: 309 | Disposition: A | Discharge status: Home / Self Care | Attending: Internal Medicine | Admitting: Internal Medicine

## 2024-10-23 DIAGNOSIS — Z8542 Personal history of malignant neoplasm of other parts of uterus: Secondary | ICD-10-CM | POA: Diagnosis not present

## 2024-10-23 DIAGNOSIS — I340 Nonrheumatic mitral (valve) insufficiency: Secondary | ICD-10-CM | POA: Diagnosis present

## 2024-10-23 DIAGNOSIS — E1169 Type 2 diabetes mellitus with other specified complication: Secondary | ICD-10-CM | POA: Diagnosis not present

## 2024-10-23 DIAGNOSIS — I5032 Chronic diastolic (congestive) heart failure: Secondary | ICD-10-CM | POA: Diagnosis present

## 2024-10-23 DIAGNOSIS — R0609 Other forms of dyspnea: Secondary | ICD-10-CM | POA: Diagnosis not present

## 2024-10-23 DIAGNOSIS — E039 Hypothyroidism, unspecified: Secondary | ICD-10-CM | POA: Diagnosis present

## 2024-10-23 DIAGNOSIS — Z1152 Encounter for screening for COVID-19: Secondary | ICD-10-CM | POA: Diagnosis not present

## 2024-10-23 DIAGNOSIS — Z885 Allergy status to narcotic agent status: Secondary | ICD-10-CM

## 2024-10-23 DIAGNOSIS — D473 Essential (hemorrhagic) thrombocythemia: Secondary | ICD-10-CM

## 2024-10-23 DIAGNOSIS — R001 Bradycardia, unspecified: Principal | ICD-10-CM | POA: Diagnosis present

## 2024-10-23 DIAGNOSIS — Z7984 Long term (current) use of oral hypoglycemic drugs: Secondary | ICD-10-CM

## 2024-10-23 DIAGNOSIS — Z79899 Other long term (current) drug therapy: Secondary | ICD-10-CM

## 2024-10-23 DIAGNOSIS — I4891 Unspecified atrial fibrillation: Secondary | ICD-10-CM | POA: Diagnosis not present

## 2024-10-23 DIAGNOSIS — E876 Hypokalemia: Secondary | ICD-10-CM | POA: Diagnosis present

## 2024-10-23 DIAGNOSIS — K219 Gastro-esophageal reflux disease without esophagitis: Secondary | ICD-10-CM | POA: Diagnosis present

## 2024-10-23 DIAGNOSIS — R0602 Shortness of breath: Secondary | ICD-10-CM | POA: Diagnosis present

## 2024-10-23 DIAGNOSIS — I441 Atrioventricular block, second degree: Principal | ICD-10-CM | POA: Diagnosis present

## 2024-10-23 DIAGNOSIS — I071 Rheumatic tricuspid insufficiency: Secondary | ICD-10-CM | POA: Diagnosis present

## 2024-10-23 DIAGNOSIS — R42 Dizziness and giddiness: Secondary | ICD-10-CM | POA: Diagnosis not present

## 2024-10-23 DIAGNOSIS — N1831 Chronic kidney disease, stage 3a: Secondary | ICD-10-CM | POA: Diagnosis present

## 2024-10-23 DIAGNOSIS — I130 Hypertensive heart and chronic kidney disease with heart failure and stage 1 through stage 4 chronic kidney disease, or unspecified chronic kidney disease: Secondary | ICD-10-CM | POA: Diagnosis present

## 2024-10-23 DIAGNOSIS — E785 Hyperlipidemia, unspecified: Secondary | ICD-10-CM | POA: Diagnosis present

## 2024-10-23 DIAGNOSIS — I447 Left bundle-branch block, unspecified: Secondary | ICD-10-CM | POA: Diagnosis present

## 2024-10-23 DIAGNOSIS — D539 Nutritional anemia, unspecified: Secondary | ICD-10-CM | POA: Diagnosis present

## 2024-10-23 DIAGNOSIS — I4819 Other persistent atrial fibrillation: Secondary | ICD-10-CM | POA: Diagnosis present

## 2024-10-23 DIAGNOSIS — Z79890 Hormone replacement therapy (postmenopausal): Secondary | ICD-10-CM

## 2024-10-23 DIAGNOSIS — I493 Ventricular premature depolarization: Secondary | ICD-10-CM | POA: Diagnosis present

## 2024-10-23 DIAGNOSIS — E1122 Type 2 diabetes mellitus with diabetic chronic kidney disease: Secondary | ICD-10-CM | POA: Diagnosis present

## 2024-10-23 DIAGNOSIS — F32A Depression, unspecified: Secondary | ICD-10-CM | POA: Diagnosis present

## 2024-10-23 DIAGNOSIS — Z8249 Family history of ischemic heart disease and other diseases of the circulatory system: Secondary | ICD-10-CM | POA: Diagnosis not present

## 2024-10-23 DIAGNOSIS — Z87442 Personal history of urinary calculi: Secondary | ICD-10-CM

## 2024-10-23 DIAGNOSIS — Z7901 Long term (current) use of anticoagulants: Secondary | ICD-10-CM | POA: Diagnosis not present

## 2024-10-23 DIAGNOSIS — I495 Sick sinus syndrome: Secondary | ICD-10-CM | POA: Diagnosis present

## 2024-10-23 DIAGNOSIS — I440 Atrioventricular block, first degree: Secondary | ICD-10-CM

## 2024-10-23 DIAGNOSIS — Z90710 Acquired absence of both cervix and uterus: Secondary | ICD-10-CM

## 2024-10-23 DIAGNOSIS — I10 Essential (primary) hypertension: Secondary | ICD-10-CM | POA: Diagnosis not present

## 2024-10-23 LAB — BASIC METABOLIC PANEL WITH GFR
Anion gap: 14 (ref 5–15)
BUN: 20 mg/dL (ref 8–23)
CO2: 24 mmol/L (ref 22–32)
Calcium: 9.5 mg/dL (ref 8.9–10.3)
Chloride: 103 mmol/L (ref 98–111)
Creatinine, Ser: 1.21 mg/dL — ABNORMAL HIGH (ref 0.44–1.00)
GFR, Estimated: 46 mL/min — ABNORMAL LOW
Glucose, Bld: 151 mg/dL — ABNORMAL HIGH (ref 70–99)
Potassium: 3.3 mmol/L — ABNORMAL LOW (ref 3.5–5.1)
Sodium: 141 mmol/L (ref 135–145)

## 2024-10-23 LAB — TROPONIN T, HIGH SENSITIVITY
Troponin T High Sensitivity: 28 ng/L — ABNORMAL HIGH (ref 0–19)
Troponin T High Sensitivity: 28 ng/L — ABNORMAL HIGH (ref 0–19)

## 2024-10-23 LAB — CBC
HCT: 34.2 % — ABNORMAL LOW (ref 36.0–46.0)
Hemoglobin: 11.5 g/dL — ABNORMAL LOW (ref 12.0–15.0)
MCH: 35.5 pg — ABNORMAL HIGH (ref 26.0–34.0)
MCHC: 33.6 g/dL (ref 30.0–36.0)
MCV: 105.6 fL — ABNORMAL HIGH (ref 80.0–100.0)
Platelets: 389 K/uL (ref 150–400)
RBC: 3.24 MIL/uL — ABNORMAL LOW (ref 3.87–5.11)
RDW: 12.9 % (ref 11.5–15.5)
WBC: 9.1 K/uL (ref 4.0–10.5)
nRBC: 0 % (ref 0.0–0.2)

## 2024-10-23 LAB — MAGNESIUM: Magnesium: 1.2 mg/dL — ABNORMAL LOW (ref 1.7–2.4)

## 2024-10-23 LAB — TSH: TSH: 8.22 u[IU]/mL — ABNORMAL HIGH (ref 0.350–4.500)

## 2024-10-23 LAB — RESP PANEL BY RT-PCR (RSV, FLU A&B, COVID)  RVPGX2
Influenza A by PCR: NEGATIVE
Influenza B by PCR: NEGATIVE
Resp Syncytial Virus by PCR: NEGATIVE
SARS Coronavirus 2 by RT PCR: NEGATIVE

## 2024-10-23 LAB — PROTIME-INR
INR: 1.2 (ref 0.8–1.2)
Prothrombin Time: 16 s — ABNORMAL HIGH (ref 11.4–15.2)

## 2024-10-23 LAB — CBG MONITORING, ED: Glucose-Capillary: 136 mg/dL — ABNORMAL HIGH (ref 70–99)

## 2024-10-23 LAB — PRO BRAIN NATRIURETIC PEPTIDE: Pro Brain Natriuretic Peptide: 768 pg/mL — ABNORMAL HIGH

## 2024-10-23 MED ORDER — LEVOTHYROXINE SODIUM 75 MCG PO TABS
75.0000 ug | ORAL_TABLET | Freq: Every day | ORAL | Status: DC
  Administered 2024-10-24 – 2024-10-25 (×2): 75 ug via ORAL
  Filled 2024-10-23 (×2): qty 1

## 2024-10-23 MED ORDER — ACETAMINOPHEN 325 MG PO TABS: 650.0000 mg | ORAL_TABLET | Freq: Four times a day (QID) | ORAL | Status: DC | PRN

## 2024-10-23 MED ORDER — POTASSIUM CHLORIDE CRYS ER 20 MEQ PO TBCR
40.0000 meq | EXTENDED_RELEASE_TABLET | Freq: Once | ORAL | Status: AC
  Administered 2024-10-23: 40 meq via ORAL
  Filled 2024-10-23: qty 2

## 2024-10-23 MED ORDER — ATORVASTATIN CALCIUM 40 MG PO TABS
40.0000 mg | ORAL_TABLET | Freq: Every day | ORAL | Status: DC
  Administered 2024-10-23 – 2024-10-24 (×2): 40 mg via ORAL
  Filled 2024-10-23 (×2): qty 1

## 2024-10-23 MED ORDER — ONDANSETRON HCL 4 MG/2ML IJ SOLN: 4.0000 mg | Freq: Four times a day (QID) | INTRAMUSCULAR | Status: DC | PRN

## 2024-10-23 MED ORDER — INSULIN ASPART 100 UNIT/ML IJ SOLN: 0.0000 [IU] | Freq: Three times a day (TID) | INTRAMUSCULAR | Status: DC

## 2024-10-23 MED ORDER — ONDANSETRON HCL 4 MG PO TABS: 4.0000 mg | ORAL_TABLET | Freq: Four times a day (QID) | ORAL | Status: DC | PRN

## 2024-10-23 MED ORDER — HEPARIN SODIUM (PORCINE) 5000 UNIT/ML IJ SOLN: 5000.0000 [IU] | Freq: Three times a day (TID) | INTRAMUSCULAR | Status: DC

## 2024-10-23 MED ORDER — PANTOPRAZOLE SODIUM 40 MG PO TBEC
40.0000 mg | DELAYED_RELEASE_TABLET | Freq: Every day | ORAL | Status: DC
  Administered 2024-10-24 – 2024-10-25 (×2): 40 mg via ORAL
  Filled 2024-10-23 (×2): qty 1

## 2024-10-23 MED ORDER — MAGNESIUM SULFATE 2 GM/50ML IV SOLN
2.0000 g | Freq: Once | INTRAVENOUS | Status: AC
  Administered 2024-10-23: 2 g via INTRAVENOUS
  Filled 2024-10-23: qty 50

## 2024-10-23 MED ORDER — INSULIN ASPART 100 UNIT/ML IJ SOLN: 0.0000 [IU] | Freq: Every day | INTRAMUSCULAR | Status: DC

## 2024-10-23 MED ORDER — ACETAMINOPHEN 650 MG RE SUPP: 650.0000 mg | Freq: Four times a day (QID) | RECTAL | Status: DC | PRN

## 2024-10-23 MED ORDER — APIXABAN 5 MG PO TABS
5.0000 mg | ORAL_TABLET | Freq: Two times a day (BID) | ORAL | Status: DC
  Administered 2024-10-23 – 2024-10-25 (×4): 5 mg via ORAL
  Filled 2024-10-23 (×4): qty 1

## 2024-10-23 NOTE — Consult Note (Addendum)
 "  Cardiology Consultation   [name] MRN: [id number]; DOB: 07/05/1946  Admit date: 10/23/2024 Date of Consult: 10/23/2024  PCP:  Dettinger, Fonda LABOR, MD   [facility] Providers Cardiologist:  Alvan Carrier, MD        Patient Profile: Heather Barber is a 78 y.o. female with a hx of AF/AFL, HFrecEF, hypothyroidism, HLD, HTN who is being seen 10/23/2024 for the evaluation of lightheadedness and dyspnea at the request of ED.  History of Present Illness: Heather Barber reports that 2 days ago, she started to have episodes of exertional dyspnea, lightheadedness, and generalized fatigue. She endorses chronic orthopnea with no recent worsening. She also reports intermittent palpitation with a feeling of heart skipping a beat. No leg swelling or PND. She's been taking her PRN Lasix  20 mg as she thought her symptoms are related to increased fluid with no improvement in her symptoms.  In the ED, she was hemodynamically stable. Review of telemetry demonstrated sinus rhythm with 1st degree AVB, episodes of second degree AV block (Mobitz I), PACs, and PVCs.  Past Medical History:  Diagnosis Date   (HFpEF) heart failure with preserved ejection fraction ([name])    a. EF previously 30-35% in 02/2021 in the setting of afib w/RVR --> normalized by repeat imaging in 05/2021.   Anxiety    Atrial flutter ([name])    a. s/p DCCV in 02/2021   Atrial septal defect    Surgical repair in 1994   Cardiomyopathy [facility])    Chest pain    Normal coronary angiography in 1994 and 2011   Dizziness    Endometrial cancer, grade I ([name]) 10/09/2019   Essential thrombocythemia ([name]) 06/06/2016   GERD (gastroesophageal reflux disease)    History of kidney stones    Hyperlipidemia    Hypertension    LBBB (left bundle branch block) 2011   Mitral regurgitation    Palpitations    Persistent atrial fibrillation ([name])    PONV (postoperative nausea and vomiting)    Syncope    Tricuspid  regurgitation    Type 2 diabetes mellitus with other specified complication ([name]) 07/18/2019   Ventricular tachycardia ([name])    Exercise induced    Past Surgical History:  Procedure Laterality Date   APPENDECTOMY     ASD REPAIR  1994   [facility]   BREAST EXCISIONAL BIOPSY     Right and left   BREAST SURGERY     right and left breast-benign   CARDIAC CATHETERIZATION  12/24/2009   Dr. Wonda    CARDIOVERSION N/A 03/04/2021   Procedure: CARDIOVERSION;  Surgeon: Alvan Carrier FALCON, MD;  Location: [facility];  Service: Endoscopy;  Laterality: N/A;   CATARACT EXTRACTION W/PHACO Left 03/11/2013   Procedure: CATARACT EXTRACTION PHACO AND INTRAOCULAR LENS PLACEMENT (IOC);  Surgeon: Oneil T. Roz, MD;  Location: [facility];  Service: Ophthalmology;  Laterality: Left;  CDE:  12.20   HYSTEROSCOPY WITH D & C N/A 11/11/2019   Procedure: DILATATION AND CURETTAGE /HYSTEROSCOPY;  Surgeon: Edsel Norleen GAILS, MD;  Location: [facility];  Service: Gynecology;  Laterality: N/A;   POLYPECTOMY N/A 11/11/2019   Procedure: POLYPECTOMY(ENDOMETRIAL POLYP);  Surgeon: Edsel Norleen GAILS, MD;  Location: [facility];  Service: Gynecology;  Laterality: N/A;   ROBOTIC ASSISTED LAPAROSCOPIC HYSTERECTOMY AND SALPINGECTOMY Bilateral 12/09/2019   Procedure: XI ROBOTIC ASSISTED LAPAROSCOPIC HYSTERECTOMY BILATERAL SALPINGOOOPHORECTOMY;  Surgeon: Eloy Herring, MD;  Location: [facility];  Service: Gynecology;  Laterality: Bilateral;   SENTINEL NODE BIOPSY  N/A 12/09/2019   Procedure: SENTINEL LYMPH  NODE BIOPSY;  Surgeon: Eloy Herring, MD;  Location: [facility];  Service: Gynecology;  Laterality: N/A;   TEE WITHOUT CARDIOVERSION N/A 03/04/2021   Procedure: TRANSESOPHAGEAL ECHOCARDIOGRAM (TEE);  Surgeon: Alvan Dorn FALCON, MD;  Location: [facility];  Service: Endoscopy;  Laterality: N/A;   TONSILLECTOMY     TUBAL LIGATION     Bilateral     Home Medications:  Prior to Admission medications  Medication Sig Start Date End Date Taking? Authorizing Provider   Accu-Chek Softclix Lancets lancets 1 each by Other route daily at 12 noon. Use as instructed 07/06/22   Dettinger, Fonda LABOR, MD  amiodarone  (PACERONE ) 200 MG tablet TAKE 1/2 TABLET BY MOUTH DAILY 07/01/24   Alvan Dorn FALCON, MD  atorvastatin  (LIPITOR) 40 MG tablet Take 1 tablet (40 mg total) by mouth at bedtime. 07/11/24   Dettinger, Fonda LABOR, MD  CVS VITAMIN B12 1000 MCG tablet TAKE 1 TABLET BY MOUTH EVERY DAY 07/21/24   Lamon Pleasant HERO, PA-C  ELIQUIS  5 MG TABS tablet TAKE 1 TABLET BY MOUTH TWICE A DAY 05/14/24   Alvan Dorn FALCON, MD  ENTRESTO  24-26 MG TAKE 1 TABLET BY MOUTH TWICE A DAY 07/01/24   Alvan Dorn FALCON, MD  estradiol  (ESTRACE  VAGINAL) 0.1 MG/GM vaginal cream Use 1 gm in vagina at bedtime 2 x weekly 07/11/24   Signa Delon LABOR, NP  fenofibrate  160 MG tablet TAKE 1 TABLET BY MOUTH EVERY DAY 06/04/24   Alvan Dorn FALCON, MD  ferrous sulfate  325 (65 FE) MG EC tablet Take 1 tablet (325 mg total) by mouth daily with breakfast. 06/11/23   Lamon Pleasant HERO, PA-C  flavoxATE (URISPAS) 100 MG tablet Take 100 mg by mouth every 6 (six) hours as needed. 01/02/24   [provider]  furosemide  (LASIX ) 20 MG tablet TAKE 1 TABLET (20 MG TOTAL) BY MOUTH DAILY AS NEEDED (SWELLING). 07/01/24   Alvan Dorn FALCON, MD  glucose blood (ACCU-CHEK GUIDE) test strip 1 each by Other route daily. Use as instructed 07/06/22   Dettinger, Fonda LABOR, MD  hydroxyurea  (HYDREA ) 500 MG capsule Take 1 capsule (500 mg total) by mouth daily. 01/23/24   Lamon Pleasant HERO, PA-C  ketoconazole  (NIZORAL ) 2 % cream Apply 1 application topically 2 (two) times daily. 06/16/20   Dettinger, Fonda LABOR, MD  levothyroxine  (SYNTHROID ) 75 MCG tablet Take 1 tablet (75 mcg total) by mouth daily before breakfast. 06/04/24   Dettinger, Fonda LABOR, MD  metFORMIN  (GLUCOPHAGE ) 500 MG tablet Take 1 tablet (500 mg total) by mouth 2 (two) times daily with a meal. 07/11/24   Dettinger, Fonda LABOR, MD  methenamine  (HIPREX ) 1 g tablet TAKE 1 TABLET  (1 G TOTAL) BY MOUTH 2 (TWO) TIMES DAILY WITH A MEAL. 11/19/23   Matilda Senior, MD  metoprolol  succinate (TOPROL -XL) 50 MG 24 hr tablet TAKE 1 AND 1/2 TABLETS DAILY (75 MG TOTAL) BY MOUTH WITH FOOD OR IMMEDIATELY AFTER MEALS 09/05/24   Alvan Dorn FALCON, MD  Multiple Vitamins-Minerals (MULTIVITAMIN WOMEN 50+) TABS Take by mouth daily.    [provider]  naproxen sodium (ALEVE) 220 MG tablet Take 220 mg by mouth as needed.    [provider]  omeprazole  (PRILOSEC) 20 MG capsule TAKE 1 CAPSULE BY MOUTH EVERY DAY 09/04/24   Dettinger, Fonda LABOR, MD    Scheduled Meds:  Continuous Infusions:  magnesium  sulfate bolus IVPB 2 g (10/23/24 2104)   PRN Meds:   Allergies:   Allergies[1]  Social History:   Social History   Socioeconomic History   Marital status: Widowed    Spouse name: widowed   Number of children: 3   Years of education: Not on file   Highest education level: Not on file  Occupational History   Occupation: Production designer, theatre/television/film shop  Tobacco Use   Smoking status: Never   Smokeless tobacco: Never  Vaping Use   Vaping status: Never Used  Substance and Sexual Activity   Alcohol use: No   Drug use: No   Sexual activity: Not Currently    Birth control/protection: Post-menopausal, Surgical    Comment: hyst  Other Topics Concern   Not on file  Social History Narrative   Married and resides in [place]. 3 children. 3 grandchildren.   Sedentary lifestyle   Social Drivers of Health   Tobacco Use: Low Risk (10/09/2024)   Patient History    Smoking Tobacco Use: Never    Smokeless Tobacco Use: Never    Passive Exposure: Not on file  Financial Resource Strain: Low Risk (03/29/2023)   Overall Financial Resource Strain (CARDIA)    Difficulty of Paying Living Expenses: Not hard at all  Food Insecurity: No Food Insecurity (03/29/2023)   Hunger Vital Sign    Worried About Running Out of Food in the Last Year: Never true    Ran Out of Food in the Last Year: Never  true  Transportation Needs: No Transportation Needs (03/29/2023)   PRAPARE - Administrator, Civil Service (Medical): No    Lack of Transportation (Non-Medical): No  Physical Activity: Insufficiently Active (03/29/2023)   Exercise Vital Sign    Days of Exercise per Week: 1 day    Minutes of Exercise per Session: 10 min  Stress: No Stress Concern Present (03/29/2023)   [employer] of Occupational Health - Occupational Stress Questionnaire    Feeling of Stress : Not at all  Social Connections: Moderately Isolated (03/29/2023)   Social Connection and Isolation Panel    Frequency of Communication with Friends and Family: Three times a week    Frequency of Social Gatherings with Friends and Family: Once a week    Attends Religious Services: More than 4 times per year    Active Member of [employer] or Organizations: No    Attends Banker Meetings: Never    Marital Status: Widowed  Intimate Partner Violence: Not At Risk (03/29/2023)   Humiliation, Afraid, Rape, and Kick questionnaire    Fear of Current or Ex-Partner: No    Emotionally Abused: No    Physically Abused: No    Sexually Abused: No  Depression (PHQ2-9): Low Risk (10/09/2024)   Depression (PHQ2-9)    PHQ-2 Score: 0  Alcohol Screen: Low Risk (03/29/2023)   Alcohol Screen    Last Alcohol Screening Score (AUDIT): 0  Housing: Low Risk (03/29/2023)   Housing    Last Housing Risk Score: 0  Utilities: Not At Risk (03/29/2023)   AHC Utilities    Threatened with loss of utilities: No  Health Literacy: Not on file    Family History:   Family History  Problem Relation Age of Onset   Cancer Mother        female   Heart failure Father    Stroke Sister    Cancer Sister        breast   Coronary artery disease Neg Hx      ROS:  Please see the history of present illness.   All other  ROS reviewed and negative.     Physical Exam/Data: Vitals:   10/23/24 1845 10/23/24 1945 10/23/24 2000 10/23/24 2115  BP:  (!) 139/51 137/63 (!) 116/56 129/62  Pulse: (!) 40 (!) 34 (!) 40 (!) 51  Resp: (!) 25 14 16 17   Temp:      SpO2: 99% 99% 100% 99%   No intake or output data in the 24 hours ending 10/23/24 2118    10/09/2024    2:41 PM 09/01/2024   11:14 AM 07/16/2024   11:02 AM  Last 3 Weights  Weight (lbs) 169 lb 163 lb 3.2 oz 166 lb 6.4 oz  Weight (kg) 76.658 kg 74.027 kg 75.479 kg     There is no height or weight on file to calculate BMI.  General:  Well nourished, well developed, in no acute distress HEENT: normal Neck: no JVD Vascular: No carotid bruits; Distal pulses 2+ bilaterally Cardiac:  normal S1, S2; RRR; no murmur  Lungs:  clear to auscultation bilaterally, no wheezing, rhonchi or rales  Abd: soft, nontender, no hepatomegaly  Ext: no edema Musculoskeletal:  No deformities, BUE and BLE strength normal and equal Skin: warm and dry  Neuro:  CNs 2-12 intact, no focal abnormalities noted Psych:  Normal affect   EKG:  The EKG was personally reviewed and demonstrates:  sinus rhythm with 1st degree AV block and PVCs in bigeminy  Telemetry:  Telemetry was personally reviewed and demonstrates:  sinus rhythm with 1st degree AVB, episodes of second degree AV block (Mobitz I), PACs, and PVCs.   Relevant CV Studies: TTE 05/2023:  1. Left ventricular ejection fraction, by estimation, is 60 to 65%. The  left ventricle has normal function. The left ventricle has no regional  wall motion abnormalities. There is mild left ventricular hypertrophy.  Left ventricular diastolic parameters  are consistent with Grade I diastolic dysfunction (impaired relaxation).   2. Right ventricular systolic function is normal. The right ventricular  size is normal. There is mildly elevated pulmonary artery systolic  pressure.   3. Left atrial size was moderately dilated.   4. Right atrial size was mildly dilated.   5. The mitral valve is abnormal. Mild mitral valve regurgitation. No  evidence of mitral stenosis.    6. The tricuspid valve is abnormal. Tricuspid valve regurgitation is  moderate.   7. The aortic valve is tricuspid. There is mild calcification of the  aortic valve. There is mild thickening of the aortic valve. Aortic valve  regurgitation is not visualized. No aortic stenosis is present.   8. The inferior vena cava is normal in size with greater than 50%  respiratory variability, suggesting right atrial pressure of 3 mmHg.   Laboratory Data: High Sensitivity Troponin:  No results for input(s): TROPONINIHS in the last 720 hours.  Recent Labs  Lab 10/23/24 1758 10/23/24 1958  TRNPT 28* 28*      Chemistry Recent Labs  Lab 10/23/24 1758  NA 141  K 3.3*  CL 103  CO2 24  GLUCOSE 151*  BUN 20  CREATININE 1.21*  CALCIUM  9.5  MG 1.2*  GFRNONAA 46*  ANIONGAP 14    No results for input(s): PROT, ALBUMIN, AST, ALT, ALKPHOS, BILITOT in the last 168 hours. Lipids No results for input(s): CHOL, TRIG, HDL, LABVLDL, LDLCALC, CHOLHDL in the last 168 hours.  Hematology Recent Labs  Lab 10/23/24 1758  WBC 9.1  RBC 3.24*  HGB 11.5*  HCT 34.2*  MCV 105.6*  MCH 35.5*  MCHC 33.6  RDW 12.9  PLT 389   Thyroid  No results for input(s): TSH, FREET4 in the last 168 hours.  BNP Recent Labs  Lab 10/23/24 1758  PROBNP 768.0*    DDimer No results for input(s): DDIMER in the last 168 hours.  Radiology/Studies:  DG Chest Portable 1 View Result Date: 10/23/2024 EXAM: 1 VIEW(S) XRAY OF THE CHEST 10/23/2024 06:38:43 PM COMPARISON: 11/26/2023 CLINICAL HISTORY: sob FINDINGS: LUNGS AND PLEURA: No focal pulmonary opacity. Basilar scarring. No pleural effusion. No pneumothorax. HEART AND MEDIASTINUM: Sternotomy wires noted. Cardiomegaly, stable. CABG changes. Aortic atherosclerosis. BONES AND SOFT TISSUES: No acute osseous abnormality. IMPRESSION: 1. No acute cardiopulmonary abnormality. Electronically signed by: Norman Gatlin MD 10/23/2024 06:47 PM EST RP  Workstation: [id number]     Assessment and Plan: Lightheadedness  First and second degree AV block (Mobitz 1) PACs and PVCs  Review of telemetry shows sinus rhythm with 1st degree AVB, episodes of second degree AV block (Mobitz I), PACs, and PVCs. No high grade AV block. No signs of hypervolemia. This is in the setting of hypoMg (1.2) and hypoK (3.3).  This presentation may be explained by electrolyte-mediated conduction slowing and ectopy superimposed on intrinsic conduction disease. - Agree with aggressive electrolyte repletion - Hold IV diuresis - Hold PO amiodarone  for now - Avoid AV nodal blocking agents - Continue telemetry monitoring - TTE in the AM - EP to see in the AM    For questions or updates, please contact [facility] Please consult [email or web address] for contact info under      Signed, Gillian CHRISTELLA Cass, MD  10/23/2024 9:18 PM      [1]  Allergies Allergen Reactions   Codeine Nausea And Vomiting   Morphine  Other (See Comments)    GI symptoms   "

## 2024-10-23 NOTE — ED Triage Notes (Signed)
 Pt to er, pt states that she is here for a heart rate around 30, states that she is feeling dizzy and weak.  States that she also feels short of breath.

## 2024-10-23 NOTE — ED Provider Notes (Signed)
 " [place] [facility] AT [facility] Provider Note   CSN: [id number] Arrival date & time: 10/23/24  1718     Patient presents with: Bradycardia   Heather Barber is a 78 y.o. female.  With a atrial fibrillation on Eliquis , ventricular tachycardia, heart failure and  type 2 diabetes who presents to the ED for weakness.  Ongoing generalized dizziness and weakness throughout the day today.  Bradycardia noted prior to arrival.  Is compliant with Eliquis  metoprolol  amiodarone  for atrial fibrillation.  No chest pain but does have some shortness of breath when walking short distances.  No fevers chills recent illness   HPI     Prior to Admission medications  Medication Sig Start Date End Date Taking? Authorizing Provider  amiodarone  (PACERONE ) 200 MG tablet TAKE 1/2 TABLET BY MOUTH DAILY 07/01/24  Yes Branch, Dorn FALCON, MD  apixaban  (ELIQUIS ) 5 MG TABS tablet Take 5 mg by mouth 2 (two) times daily.   Yes [provider]  atorvastatin  (LIPITOR) 40 MG tablet Take 1 tablet (40 mg total) by mouth at bedtime. 07/11/24  Yes Dettinger, Fonda LABOR, MD  cycloSPORINE  (RESTASIS ) 0.05 % ophthalmic emulsion 1 drop 2 (two) times daily. 10/13/24  Yes [provider]  fenofibrate  160 MG tablet TAKE 1 TABLET BY MOUTH EVERY DAY 06/04/24  Yes Branch, Dorn FALCON, MD  furosemide  (LASIX ) 20 MG tablet TAKE 1 TABLET (20 MG TOTAL) BY MOUTH DAILY AS NEEDED (SWELLING). Patient taking differently: Take 20 mg by mouth daily. Take one tablet (20mg ) by mouth daily in the morning.  May take an additional tablet (20mg ) by mouth as needed for increased swelling. 07/01/24  Yes BranchDorn FALCON, MD  hydroxyurea  (HYDREA ) 500 MG capsule Take 1 capsule (500 mg total) by mouth daily. 01/23/24  Yes Pennington, Rebekah M, PA-C  prednisoLONE  acetate (PRED FORTE ) 1 % ophthalmic suspension PLEASE SEE ATTACHED FOR DETAILED DIRECTIONS 10/13/24  Yes [provider]  sacubitril -valsartan  (ENTRESTO )  24-26 MG Take 1 tablet by mouth 2 (two) times daily.   Yes [provider]  [employer] VITAMIN B12 1000 MCG tablet TAKE 1 TABLET BY MOUTH EVERY DAY 07/21/24   Lamon Herter M, PA-C  estradiol  (ESTRACE  VAGINAL) 0.1 MG/GM vaginal cream Use 1 gm in vagina at bedtime 2 x weekly 07/11/24   Signa Delon LABOR, NP  ferrous sulfate  325 (65 FE) MG EC tablet Take 1 tablet (325 mg total) by mouth daily with breakfast. 06/11/23   Lamon Herter CHRISTELLA, PA-C  flavoxATE (URISPAS) 100 MG tablet Take 100 mg by mouth every 6 (six) hours as needed. 01/02/24   [provider]  ketoconazole  (NIZORAL ) 2 % cream Apply 1 application topically 2 (two) times daily. 06/16/20   Dettinger, Fonda LABOR, MD  levothyroxine  (SYNTHROID ) 75 MCG tablet Take 1 tablet (75 mcg total) by mouth daily before breakfast. 06/04/24   Dettinger, Fonda LABOR, MD  metFORMIN  (GLUCOPHAGE ) 500 MG tablet Take 1 tablet (500 mg total) by mouth 2 (two) times daily with a meal. 07/11/24   Dettinger, Fonda LABOR, MD  methenamine  (HIPREX ) 1 g tablet TAKE 1 TABLET (1 G TOTAL) BY MOUTH 2 (TWO) TIMES DAILY WITH A MEAL. 11/19/23   Matilda Senior, MD  metoprolol  succinate (TOPROL -XL) 50 MG 24 hr tablet TAKE 1 AND 1/2 TABLETS DAILY (75 MG TOTAL) BY MOUTH WITH FOOD OR IMMEDIATELY AFTER MEALS 09/05/24   Alvan Dorn FALCON, MD  Multiple Vitamins-Minerals (MULTIVITAMIN WOMEN 50+) TABS Take by mouth daily.    [provider]  naproxen sodium (ALEVE) 220 MG tablet Take 220 mg by mouth as needed.    [provider]  omeprazole  (PRILOSEC) 20 MG capsule TAKE 1 CAPSULE BY MOUTH EVERY DAY 09/04/24   Dettinger, Fonda LABOR, MD    Allergies: Codeine and Morphine     Review of Systems  Updated Vital Signs BP 129/62   Pulse (!) 51   Temp 98.5 F (36.9 C)   Resp 17   SpO2 99%   Physical Exam Vitals and nursing note reviewed.  HENT:     Head: Normocephalic and atraumatic.  Eyes:     Pupils: Pupils are equal, round, and reactive to light.   Cardiovascular:     Rate and Rhythm: Regular rhythm. Bradycardia present.  Pulmonary:     Effort: Pulmonary effort is normal.     Breath sounds: Normal breath sounds.  Abdominal:     Palpations: Abdomen is soft.     Tenderness: There is no abdominal tenderness.  Skin:    General: Skin is warm and dry.  Neurological:     Mental Status: She is alert.  Psychiatric:        Mood and Affect: Mood normal.     (all labs ordered are listed, but only abnormal results are displayed) Labs Reviewed  BASIC METABOLIC PANEL WITH GFR - Abnormal; Notable for the following components:      Result Value   Potassium 3.3 (*)    Glucose, Bld 151 (*)    Creatinine, Ser 1.21 (*)    GFR, Estimated 46 (*)    All other components within normal limits  CBC - Abnormal; Notable for the following components:   RBC 3.24 (*)    Hemoglobin 11.5 (*)    HCT 34.2 (*)    MCV 105.6 (*)    MCH 35.5 (*)    All other components within normal limits  PROTIME-INR - Abnormal; Notable for the following components:   Prothrombin Time 16.0 (*)    All other components within normal limits  PRO BRAIN NATRIURETIC PEPTIDE - Abnormal; Notable for the following components:   Pro Brain Natriuretic Peptide 768.0 (*)    All other components within normal limits  MAGNESIUM  - Abnormal; Notable for the following components:   Magnesium  1.2 (*)    All other components within normal limits  TSH - Abnormal; Notable for the following components:   TSH 8.220 (*)    All other components within normal limits  CBG MONITORING, ED - Abnormal; Notable for the following components:   Glucose-Capillary 136 (*)    All other components within normal limits  TROPONIN T, HIGH SENSITIVITY - Abnormal; Notable for the following components:   Troponin T High Sensitivity 28 (*)    All other components within normal limits  TROPONIN T, HIGH SENSITIVITY - Abnormal; Notable for the following components:   Troponin T High Sensitivity 28 (*)    All  other components within normal limits  RESP PANEL BY RT-PCR (RSV, FLU A&B, COVID)  RVPGX2  CBC  CREATININE, SERUM  MAGNESIUM   BASIC METABOLIC PANEL WITH GFR  CBC  HEMOGLOBIN A1C    EKG: EKG Interpretation Date/Time:  Thursday October 23 2024 17:28:51 EST Ventricular Rate:  54 PR Interval:    QRS Duration:  142 QT Interval:  460 QTC Calculation: 436 R Axis:   109  Text Interpretation: Atrial fibrillation with slow ventricular response Rightward axis Non-specific intra-ventricular conduction block Cannot rule out Anterior infarct , age undetermined Abnormal ECG When  compared with ECG of 01-Aug-2023 16:03, PREVIOUS ECG IS PRESENT Confirmed by Pamella Sharper [phone]) on 10/23/2024 6:11:13 PM  Radiology: DG Chest Portable 1 View Result Date: 10/23/2024 EXAM: 1 VIEW(S) XRAY OF THE CHEST 10/23/2024 06:38:43 PM COMPARISON: 11/26/2023 CLINICAL HISTORY: sob FINDINGS: LUNGS AND PLEURA: No focal pulmonary opacity. Basilar scarring. No pleural effusion. No pneumothorax. HEART AND MEDIASTINUM: Sternotomy wires noted. Cardiomegaly, stable. CABG changes. Aortic atherosclerosis. BONES AND SOFT TISSUES: No acute osseous abnormality. IMPRESSION: 1. No acute cardiopulmonary abnormality. Electronically signed by: Norman Gatlin MD 10/23/2024 06:47 PM EST RP Workstation: [id number]     Procedures   Medications Ordered in the ED  acetaminophen  (TYLENOL ) tablet 650 mg (has no administration in time range)    Or  acetaminophen  (TYLENOL ) suppository 650 mg (has no administration in time range)  ondansetron  (ZOFRAN ) tablet 4 mg (has no administration in time range)    Or  ondansetron  (ZOFRAN ) injection 4 mg (has no administration in time range)  insulin  aspart (novoLOG ) injection 0-15 Units (has no administration in time range)  insulin  aspart (novoLOG ) injection 0-5 Units (has no administration in time range)  atorvastatin  (LIPITOR) tablet 40 mg (40 mg Oral Given 10/23/24 2343)  apixaban  (ELIQUIS )  tablet 5 mg (5 mg Oral Given 10/23/24 2343)  levothyroxine  (SYNTHROID ) tablet 75 mcg (has no administration in time range)  pantoprazole  (PROTONIX ) EC tablet 40 mg (has no administration in time range)  magnesium  sulfate IVPB 2 g 50 mL (0 g Intravenous Stopped 10/23/24 2204)  potassium chloride  SA (KLOR-CON  M) CR tablet 40 mEq (40 mEq Oral Given 10/23/24 2102)    Clinical Course as of 10/23/24 2348  Thu Oct 23, 2024  2106 discuss with cardiology who will evaluate patient in the ED discuss [MP]  2243 Dr. Otelia has evaluated the patient and recommends admission with telemetry given concern for secondary heart block.  Plan for EP consult tomorrow morning [MP]    Clinical Course User Index [MP] Pamella Sharper LABOR, DO                                 Medical Decision Making 78 year old female with history as above presented to the ED for bradycardia weakness.  Normotensive bradycardic on my assessment no active chest pain or shortness of breath.  Laboratory workup showed hypokalemia hypomagnesemia.  Will replete electrolytes here.  Initial EKG looks like slow atrial fibrillation but after discussion with cardiology (Dr. Otelia), more concerning for runs of second degree heart block (Wenckebach) with PACs.  Plan for admission to telemetry and EP consult in the morning.  Discussed with Dr. Vernon (hospitalist) who accept patient for admission  Amount and/or Complexity of Data Reviewed Labs: ordered. Radiology: ordered.  Risk Prescription drug management. Decision regarding hospitalization.        Final diagnoses:  Bradycardia  Second degree AV block    ED Discharge Orders     None          Pamella Sharper LABOR, DO 10/23/24 2348  "

## 2024-10-23 NOTE — H&P (Signed)
 " History and Physical    Heather Barber FMW:[id number] DOB: [birth year] DOA: 10/23/2024  PCP: Dettinger, Fonda LABOR, MD  Patient coming from: Home  I have personally briefly reviewed patient's old medical records in [facility]  Chief Complaint: Shortness of breath on exertion since yesterday HPI: Heather Barber is a 78 y.o. female with medical history significant of hypertension, type 2 diabetes, hyperlipidemia, endometrial cancer,, A-fib on Eliquis , hypothyroidism presented to emergency department with shortness of breath with exertion started yesterday while she was at grocery store and suddenly she was unable to catch her breath.  Patient reports that she has been feeling weak, dizzy, lightheaded and heart rate noted to be around 30 this morning by her pulse ox therefore she came to the ER for further evaluation and management.  She takes metoprolol  for the history of A-fib.  She denies chest pain, leg edema, orthopnea, PND.  She had been compliant with all medications.  No history of tobacco abuse, alcohol abuse, illicit drug use.  No recent syncopal episode.  Lives alone and independent on daily life activities  ED Course: Upon arrival to ED: Afebrile, pulse 39, RR: 22, BP 145/48, maintaining oxygen saturation on room air.  NA: 141, K: 3.3, BG: 151, CR: 1.21, GFR: 46.  CBC shows no leukocytosis, H&H 11.5/34.2, MCV: 105.6.  PLT: 389.  Troponin 28 x 2.  COVID flu RSV negative.  BNP elevated at 768.  Chest x-ray negative.  Her potassium and magnesium  replenished in the ED.  Cardiology consulted.  [facility] hospitalist consulted for symptomatic bradycardia.  Review of Systems: As per HPI otherwise negative.    Past Medical History:  Diagnosis Date   (HFpEF) heart failure with preserved ejection fraction ([name])    a. EF previously 30-35% in 02/2021 in the setting of afib w/RVR --> normalized by repeat imaging in 05/2021.   Anxiety    Atrial flutter ([name])    a. s/p DCCV in 02/2021   Atrial  septal defect    Surgical repair in 1994   Cardiomyopathy [facility])    Chest pain    Normal coronary angiography in 1994 and 2011   Dizziness    Endometrial cancer, grade I ([name]) 10/09/2019   Essential thrombocythemia ([name]) 06/06/2016   GERD (gastroesophageal reflux disease)    History of kidney stones    Hyperlipidemia    Hypertension    LBBB (left bundle branch block) 2011   Mitral regurgitation    Palpitations    Persistent atrial fibrillation ([name])    PONV (postoperative nausea and vomiting)    Syncope    Tricuspid regurgitation    Type 2 diabetes mellitus with other specified complication ([name]) 07/18/2019   Ventricular tachycardia ([name])    Exercise induced    Past Surgical History:  Procedure Laterality Date   APPENDECTOMY     ASD REPAIR  1994   [facility]   BREAST EXCISIONAL BIOPSY     Right and left   BREAST SURGERY     right and left breast-benign   CARDIAC CATHETERIZATION  12/24/2009   Dr. Wonda    CARDIOVERSION N/A 03/04/2021   Procedure: CARDIOVERSION;  Surgeon: Alvan Dorn FALCON, MD;  Location: [facility]S;  Service: Endoscopy;  Laterality: N/A;   CATARACT EXTRACTION W/PHACO Left 03/11/2013   Procedure: CATARACT EXTRACTION PHACO AND INTRAOCULAR LENS PLACEMENT (IOC);  Surgeon: Oneil T. Roz, MD;  Location: [facility];  Service: Ophthalmology;  Laterality: Left;  CDE:  12.20   HYSTEROSCOPY WITH  D & C N/A 11/11/2019   Procedure: DILATATION AND CURETTAGE /HYSTEROSCOPY;  Surgeon: Edsel Norleen GAILS, MD;  Location: [facility];  Service: Gynecology;  Laterality: N/A;   POLYPECTOMY N/A 11/11/2019   Procedure: POLYPECTOMY(ENDOMETRIAL POLYP);  Surgeon: Edsel Norleen GAILS, MD;  Location: [facility];  Service: Gynecology;  Laterality: N/A;   ROBOTIC ASSISTED LAPAROSCOPIC HYSTERECTOMY AND SALPINGECTOMY Bilateral 12/09/2019   Procedure: XI ROBOTIC ASSISTED LAPAROSCOPIC HYSTERECTOMY BILATERAL SALPINGOOOPHORECTOMY;  Surgeon: Eloy Herring, MD;  Location: [facility];  Service: Gynecology;  Laterality:  Bilateral;   SENTINEL NODE BIOPSY N/A 12/09/2019   Procedure: SENTINEL LYMPH  NODE BIOPSY;  Surgeon: Eloy Herring, MD;  Location: [facility];  Service: Gynecology;  Laterality: N/A;   TEE WITHOUT CARDIOVERSION N/A 03/04/2021   Procedure: TRANSESOPHAGEAL ECHOCARDIOGRAM (TEE);  Surgeon: Alvan Dorn FALCON, MD;  Location: [facility];  Service: Endoscopy;  Laterality: N/A;   TONSILLECTOMY     TUBAL LIGATION     Bilateral     reports that she has never smoked. She has never used smokeless tobacco. She reports that she does not drink alcohol and does not use drugs.  Allergies[1]  Family History  Problem Relation Age of Onset   Cancer Mother        female   Heart failure Father    Stroke Sister    Cancer Sister        breast   Coronary artery disease Neg Hx     Prior to Admission medications  Medication Sig Start Date End Date Taking? Authorizing Provider  Accu-Chek Softclix Lancets lancets 1 each by Other route daily at 12 noon. Use as instructed 07/06/22   Dettinger, Fonda LABOR, MD  amiodarone  (PACERONE ) 200 MG tablet TAKE 1/2 TABLET BY MOUTH DAILY 07/01/24   Alvan Dorn FALCON, MD  atorvastatin  (LIPITOR) 40 MG tablet Take 1 tablet (40 mg total) by mouth at bedtime. 07/11/24   Dettinger, Fonda LABOR, MD  [facility] VITAMIN B12 1000 MCG tablet TAKE 1 TABLET BY MOUTH EVERY DAY 07/21/24   Lamon Pleasant HERO, PA-C  ELIQUIS  5 MG TABS tablet TAKE 1 TABLET BY MOUTH TWICE A DAY 05/14/24   Alvan Dorn FALCON, MD  ENTRESTO  24-26 MG TAKE 1 TABLET BY MOUTH TWICE A DAY 07/01/24   Alvan Dorn FALCON, MD  estradiol  (ESTRACE  VAGINAL) 0.1 MG/GM vaginal cream Use 1 gm in vagina at bedtime 2 x weekly 07/11/24   Signa Delon LABOR, NP  fenofibrate  160 MG tablet TAKE 1 TABLET BY MOUTH EVERY DAY 06/04/24   Alvan Dorn FALCON, MD  ferrous sulfate  325 (65 FE) MG EC tablet Take 1 tablet (325 mg total) by mouth daily with breakfast. 06/11/23   Lamon Pleasant HERO, PA-C  flavoxATE (URISPAS) 100 MG tablet Take 100 mg by mouth every 6 (six)  hours as needed. 01/02/24   [provider]  furosemide  (LASIX ) 20 MG tablet TAKE 1 TABLET (20 MG TOTAL) BY MOUTH DAILY AS NEEDED (SWELLING). 07/01/24   Alvan Dorn FALCON, MD  glucose blood (ACCU-CHEK GUIDE) test strip 1 each by Other route daily. Use as instructed 07/06/22   Dettinger, Fonda LABOR, MD  hydroxyurea  (HYDREA ) 500 MG capsule Take 1 capsule (500 mg total) by mouth daily. 01/23/24   Lamon Pleasant HERO, PA-C  ketoconazole  (NIZORAL ) 2 % cream Apply 1 application topically 2 (two) times daily. 06/16/20   Dettinger, Fonda LABOR, MD  levothyroxine  (SYNTHROID ) 75 MCG tablet Take 1 tablet (75 mcg total) by mouth daily before breakfast. 06/04/24   Dettinger, Fonda LABOR, MD  metFORMIN  (GLUCOPHAGE ) 500 MG tablet Take 1 tablet (500 mg total) by mouth 2 (two) times daily with a meal. 07/11/24   Dettinger, Fonda LABOR, MD  methenamine  (HIPREX ) 1 g tablet TAKE 1 TABLET (1 G TOTAL) BY MOUTH 2 (TWO) TIMES DAILY WITH A MEAL. 11/19/23   Matilda Senior, MD  metoprolol  succinate (TOPROL -XL) 50 MG 24 hr tablet TAKE 1 AND 1/2 TABLETS DAILY (75 MG TOTAL) BY MOUTH WITH FOOD OR IMMEDIATELY AFTER MEALS 09/05/24   Alvan Dorn FALCON, MD  Multiple Vitamins-Minerals (MULTIVITAMIN WOMEN 50+) TABS Take by mouth daily.    [provider]  naproxen sodium (ALEVE) 220 MG tablet Take 220 mg by mouth as needed.    [provider]  omeprazole  (PRILOSEC) 20 MG capsule TAKE 1 CAPSULE BY MOUTH EVERY DAY 09/04/24   Dettinger, Fonda LABOR, MD    Physical Exam: Vitals:   10/23/24 1845 10/23/24 1945 10/23/24 2000 10/23/24 2115  BP: (!) 139/51 137/63 (!) 116/56 129/62  Pulse: (!) 40 (!) 34 (!) 40 (!) 51  Resp: (!) 25 14 16 17   Temp:      SpO2: 99% 99% 100% 99%    Constitutional: NAD, calm, comfortable, on room air, communicating well Eyes: PERRL, lids and conjunctivae normal ENMT: Mucous membranes are moist. Posterior pharynx clear of any exudate or lesions.Normal dentition.  Neck: normal, supple, no masses, no  thyromegaly Respiratory: clear to auscultation bilaterally, no wheezing, no crackles. Normal respiratory effort. No accessory muscle use.  Cardiovascular: Bradycardic.  Systolic murmur noted.  No gallops or rubs.  No leg edema Abdomen: no tenderness, no masses palpated. No hepatosplenomegaly. Bowel sounds positive.  Musculoskeletal: no clubbing / cyanosis. No joint deformity upper and lower extremities. Good ROM, no contractures. Normal muscle tone.  Skin: no rashes, lesions, ulcers. No induration Neurologic: CN 2-12 grossly intact. Sensation intact, DTR normal. Strength 5/5 in all 4.  Psychiatric: Normal judgment and insight. Alert and oriented x 3. Normal mood.    Labs on Admission: I have personally reviewed following labs and imaging studies  CBC: Recent Labs  Lab 10/23/24 1758  WBC 9.1  HGB 11.5*  HCT 34.2*  MCV 105.6*  PLT 389   Basic Metabolic Panel: Recent Labs  Lab 10/23/24 1758  NA 141  K 3.3*  CL 103  CO2 24  GLUCOSE 151*  BUN 20  CREATININE 1.21*  CALCIUM  9.5  MG 1.2*   GFR: CrCl cannot be calculated (Unknown ideal weight.). Liver Function Tests: No results for input(s): AST, ALT, ALKPHOS, BILITOT, PROT, ALBUMIN in the last 168 hours. No results for input(s): LIPASE, AMYLASE in the last 168 hours. No results for input(s): AMMONIA in the last 168 hours. Coagulation Profile: Recent Labs  Lab 10/23/24 1758  INR 1.2   Cardiac Enzymes: No results for input(s): CKTOTAL, CKMB, CKMBINDEX, TROPONINI in the last 168 hours. BNP (last 3 results) Recent Labs    10/23/24 1758  PROBNP 768.0*   HbA1C: No results for input(s): HGBA1C in the last 72 hours. CBG: Recent Labs  Lab 10/23/24 1820  GLUCAP 136*   Lipid Profile: No results for input(s): CHOL, HDL, LDLCALC, TRIG, CHOLHDL, LDLDIRECT in the last 72 hours. Thyroid  Function Tests: No results for input(s): TSH, T4TOTAL, FREET4, T3FREE, THYROIDAB in the  last 72 hours. Anemia Panel: No results for input(s): VITAMINB12, FOLATE, FERRITIN, TIBC, IRON, RETICCTPCT in the last 72 hours. Urine analysis:    Component Value Date/Time   COLORURINE STRAW (A) 12/05/2019 1318   APPEARANCEUR Hazy (A)  01/01/2024 1440   LABSPEC 1.010 12/05/2019 1318   PHURINE 6.0 12/05/2019 1318   GLUCOSEU Negative 01/01/2024 1440   HGBUR NEGATIVE 12/05/2019 1318   BILIRUBINUR Negative 01/01/2024 1440   KETONESUR NEGATIVE 12/05/2019 1318   PROTEINUR 2+ (A) 01/01/2024 1440   PROTEINUR NEGATIVE 12/05/2019 1318   NITRITE Negative 01/01/2024 1440   NITRITE NEGATIVE 12/05/2019 1318   LEUKOCYTESUR 1+ (A) 01/01/2024 1440   LEUKOCYTESUR SMALL (A) 12/05/2019 1318    Radiological Exams on Admission: DG Chest Portable 1 View Result Date: 10/23/2024 EXAM: 1 VIEW(S) XRAY OF THE CHEST 10/23/2024 06:38:43 PM COMPARISON: 11/26/2023 CLINICAL HISTORY: sob FINDINGS: LUNGS AND PLEURA: No focal pulmonary opacity. Basilar scarring. No pleural effusion. No pneumothorax. HEART AND MEDIASTINUM: Sternotomy wires noted. Cardiomegaly, stable. CABG changes. Aortic atherosclerosis. BONES AND SOFT TISSUES: No acute osseous abnormality. IMPRESSION: 1. No acute cardiopulmonary abnormality. Electronically signed by: Norman Gatlin MD 10/23/2024 06:47 PM EST RP Workstation: [id number]    EKG: Independently reviewed.  A-fib with slow ventricular response.  Assessment/Plan  Symptomatic bradycardia Mobitz type I AV block: - Patient presented with lightheadedness, fatigue and dyspnea on exertion.  Heart rate noted to be in 30s.  Troponin flat x 2.  BNP elevated at 768.  Chest x-ray negative.  COVID flu RSV negative. - Admit patient on telemetry. - Cardiology consulted.  Appreciated help-recommend holding metoprolol , amiodarone .  EP to see patient in the morning. -Check TSH.  Will hold metoprolol .  Will get echocardiogram - Replenish electrolytes.  Monitor closely  Hypokalemia:  Replenished - Repeat BMP tomorrow a.m.  Hypomagnesemia: Replenished - Repeat magnesium  tomorrow a.m.  Type 2 diabetes: Last A1c 6.6.  Start sliding scale insulin .  Monitor blood sugar closely  Hypothyroidism: Check TSH.  Continue current dose of levothyroxine   Paroxysmal A-fib: Continue Eliquis .  Hold off metoprolol  and amiodarone   Normocytic anemia: H&H is stable.  Continue to monitor  CKD stage IIIa: Continue to monitor  GERD: Continue PPI  DVT prophylaxis: Eliquis  Code Status: Full code Family Communication: None present at bedside.  Plan of care discussed with patient in length and he verbalized understanding and agreed with it. Disposition Plan: To be determined Consults called: Cardiology Admission status: Inpatient   Velna JONELLE Skeeter MD [facility]  If 7PM-7AM, please contact night-coverage [email or web address]  10/23/2024, 10:55 PM        [1]  Allergies Allergen Reactions   Codeine Nausea And Vomiting   Morphine  Other (See Comments)    GI symptoms   "

## 2024-10-24 ENCOUNTER — Inpatient Hospital Stay (HOSPITAL_COMMUNITY)

## 2024-10-24 ENCOUNTER — Encounter (HOSPITAL_COMMUNITY): Payer: Self-pay | Admitting: Internal Medicine

## 2024-10-24 DIAGNOSIS — I440 Atrioventricular block, first degree: Secondary | ICD-10-CM

## 2024-10-24 DIAGNOSIS — R42 Dizziness and giddiness: Secondary | ICD-10-CM

## 2024-10-24 DIAGNOSIS — I4891 Unspecified atrial fibrillation: Secondary | ICD-10-CM | POA: Diagnosis not present

## 2024-10-24 DIAGNOSIS — E1169 Type 2 diabetes mellitus with other specified complication: Secondary | ICD-10-CM

## 2024-10-24 DIAGNOSIS — R0609 Other forms of dyspnea: Secondary | ICD-10-CM

## 2024-10-24 DIAGNOSIS — I10 Essential (primary) hypertension: Secondary | ICD-10-CM

## 2024-10-24 DIAGNOSIS — E876 Hypokalemia: Secondary | ICD-10-CM

## 2024-10-24 DIAGNOSIS — E039 Hypothyroidism, unspecified: Secondary | ICD-10-CM | POA: Diagnosis not present

## 2024-10-24 DIAGNOSIS — R001 Bradycardia, unspecified: Secondary | ICD-10-CM | POA: Diagnosis not present

## 2024-10-24 DIAGNOSIS — I441 Atrioventricular block, second degree: Secondary | ICD-10-CM | POA: Diagnosis not present

## 2024-10-24 LAB — CBC
HCT: 34.1 % — ABNORMAL LOW (ref 36.0–46.0)
Hemoglobin: 11.6 g/dL — ABNORMAL LOW (ref 12.0–15.0)
MCH: 35.4 pg — ABNORMAL HIGH (ref 26.0–34.0)
MCHC: 34 g/dL (ref 30.0–36.0)
MCV: 104 fL — ABNORMAL HIGH (ref 80.0–100.0)
Platelets: 345 K/uL (ref 150–400)
RBC: 3.28 MIL/uL — ABNORMAL LOW (ref 3.87–5.11)
RDW: 13 % (ref 11.5–15.5)
WBC: 6.1 K/uL (ref 4.0–10.5)
nRBC: 0 % (ref 0.0–0.2)

## 2024-10-24 LAB — ECHOCARDIOGRAM COMPLETE
AR max vel: 2.73 cm2
AV Area VTI: 2.83 cm2
AV Area mean vel: 2.9 cm2
AV Mean grad: 6.1 mmHg
AV Peak grad: 12.6 mmHg
Ao pk vel: 1.78 m/s
Calc EF: 60 %
Height: 64 in
MV M vel: 4.76 m/s
MV Peak grad: 90.6 mmHg
MV VTI: 0.56 cm2
Radius: 0.4 cm
S' Lateral: 3.4 cm
Single Plane A2C EF: 58.8 %
Single Plane A4C EF: 65.3 %
Weight: 2631.41 [oz_av]

## 2024-10-24 LAB — BASIC METABOLIC PANEL WITH GFR
Anion gap: 12 (ref 5–15)
BUN: 17 mg/dL (ref 8–23)
CO2: 24 mmol/L (ref 22–32)
Calcium: 9.2 mg/dL (ref 8.9–10.3)
Chloride: 105 mmol/L (ref 98–111)
Creatinine, Ser: 0.99 mg/dL (ref 0.44–1.00)
GFR, Estimated: 58 mL/min — ABNORMAL LOW
Glucose, Bld: 121 mg/dL — ABNORMAL HIGH (ref 70–99)
Potassium: 3.4 mmol/L — ABNORMAL LOW (ref 3.5–5.1)
Sodium: 142 mmol/L (ref 135–145)

## 2024-10-24 LAB — GLUCOSE, CAPILLARY
Glucose-Capillary: 130 mg/dL — ABNORMAL HIGH (ref 70–99)
Glucose-Capillary: 106 mg/dL — ABNORMAL HIGH (ref 70–99)
Glucose-Capillary: 102 mg/dL — ABNORMAL HIGH (ref 70–99)
Glucose-Capillary: 113 mg/dL — ABNORMAL HIGH (ref 70–99)
Glucose-Capillary: 126 mg/dL — ABNORMAL HIGH (ref 70–99)

## 2024-10-24 LAB — HEMOGLOBIN A1C
Hgb A1c MFr Bld: 6.7 % — ABNORMAL HIGH (ref 4.8–5.6)
Mean Plasma Glucose: 145.59 mg/dL

## 2024-10-24 LAB — MRSA NEXT GEN BY PCR, NASAL: MRSA by PCR Next Gen: NOT DETECTED

## 2024-10-24 LAB — MAGNESIUM: Magnesium: 1.6 mg/dL — ABNORMAL LOW (ref 1.7–2.4)

## 2024-10-24 MED ORDER — MAGNESIUM SULFATE 4 GM/100ML IV SOLN
4.0000 g | Freq: Once | INTRAVENOUS | Status: AC
  Administered 2024-10-24: 4 g via INTRAVENOUS
  Filled 2024-10-24: qty 100

## 2024-10-24 MED ORDER — FENOFIBRATE 160 MG PO TABS
160.0000 mg | ORAL_TABLET | Freq: Every day | ORAL | Status: DC
  Administered 2024-10-24 – 2024-10-25 (×2): 160 mg via ORAL
  Filled 2024-10-24 (×2): qty 1

## 2024-10-24 MED ORDER — CYCLOSPORINE 0.05 % OP EMUL
1.0000 [drp] | Freq: Two times a day (BID) | OPHTHALMIC | Status: DC
  Administered 2024-10-24 – 2024-10-25 (×3): 1 [drp] via OPHTHALMIC
  Filled 2024-10-24 (×4): qty 30

## 2024-10-24 MED ORDER — PREDNISOLONE ACETATE 1 % OP SUSP
1.0000 [drp] | Freq: Two times a day (BID) | OPHTHALMIC | Status: DC
  Administered 2024-10-24 – 2024-10-25 (×3): 1 [drp] via OPHTHALMIC
  Filled 2024-10-24: qty 5

## 2024-10-24 MED ORDER — POTASSIUM CHLORIDE CRYS ER 20 MEQ PO TBCR
40.0000 meq | EXTENDED_RELEASE_TABLET | Freq: Once | ORAL | Status: AC
  Administered 2024-10-24: 40 meq via ORAL
  Filled 2024-10-24: qty 2

## 2024-10-24 MED ORDER — HYDROXYUREA 500 MG PO CAPS
500.0000 mg | ORAL_CAPSULE | Freq: Every day | ORAL | Status: DC
  Administered 2024-10-24 – 2024-10-25 (×2): 500 mg via ORAL
  Filled 2024-10-24 (×2): qty 1

## 2024-10-24 NOTE — TOC CM/SW Note (Signed)
 Transition of Care [facility]) - Inpatient Brief Assessment   Patient Details  Name: Heather Barber MRN: [id number] Date of Birth: 1945/12/17  [facility] of Care [facility]) CM/SW Contact:    Lauraine FORBES Saa, LCSWA Phone Number: 10/24/2024, 9:25 AM   Clinical Narrative:  9:30 AM Per chart review, patient resides at home alone. Patient has a PCP and insurance. Patient does not have SNF/HH/DME history. Patient's preferred pharmacy is [facility] [address]. No TOC needs identified at this time. TOC will continue to follow.  Transition of Care Asessment: Insurance and Status: Insurance coverage has been reviewed Patient has primary care physician: Yes Home environment has been reviewed: Private Residence Prior level of function:: N/A Prior/Current Home Services: No current home services Social Drivers of Health Review: SDOH reviewed no interventions necessary Readmission risk has been reviewed: Yes (Currently Green 13%) Transition of care needs: no transition of care needs at this time

## 2024-10-24 NOTE — Progress Notes (Signed)
 "        [facility]                                                                               Heather Barber, is a 78 y.o. female, DOB - [birth year], FMW:[id number] Admit date - 10/23/2024    Outpatient Primary MD for the patient is Dettinger, Fonda LABOR, MD  LOS - 1  days    Brief summary    Heather Barber is a 78 y.o. female with medical history significant of hypertension, type 2 diabetes, hyperlipidemia, endometrial cancer,, A-fib on Eliquis , hypothyroidism presented to emergency department with shortness of breath with exertion  COVID flu RSV negative. BNP elevated at 768. Chest x-ray negative. Her potassium and magnesium  replenished in the ED. Cardiology consulted. [facility] consulted for symptomatic bradycardia.   Assessment & Plan    Assessment and Plan:    Symptomatic bradycardia Mobitz type 1 AV block Atrial fibrillation with slow ventricular rate.  Patient reported lightheadedness, fatigue and DOE.  BNP elevated at 768. Chest x-ray negative. COVID flu RSV negative  Currently HR is between 50 to 60's.  Dizziness has improved.  Cardiology on board.  Patient was on amiodarone  and metoprolol  at home which were both held.  EP consult pending.  Echocardiogram pending.     Hypokalemia and hypomagnesemia Replaced.     Type 2 DM On metformin  at home, which is on hold.  Continue with SSI for now.  A1c is 6.7%    Hypertension;  BP parameters are optimal.  Patient on entresto , lasix  metoprolol , which are all on hold.     Hypothyroidism:  TSH elevated probably from being on amiodarone .  Getting free t3 and t4 .    Atrial fib with SVR On eliquis  for anti coagulation.  Metoprolol  and amiodarone  are on hold.    Mildly elevated troponin and elevated BNP at 768 No chest pain.     Macrocytic anemia Hemoglobin around 11 and stable.    Chronic diastolic CHF She appears Euvolemic.     Estimated body mass index is 28.23 kg/m as  calculated from the following:   Height as of this encounter: 5' 4 (1.626 m).   Weight as of this encounter: 74.6 kg.  Code Status: full code.  DVT Prophylaxis:  SCDs Start: 10/23/24 2255 apixaban  (ELIQUIS ) tablet 5 mg   Level of Care: Level of care: Telemetry Family Communication: none at bedside.   Disposition Plan:     Remains inpatient appropriate:  PENDING further work up for bradycardia.   Procedures:  ECHOCARDIOGRAM    Consultants:   Cardiology EP  Antimicrobials:   Anti-infectives (From admission, onward)    None        Medications  Scheduled Meds:  apixaban   5 mg Oral BID   atorvastatin   40 mg Oral QHS   insulin  aspart  0-15 Units Subcutaneous TID WC   insulin  aspart  0-5 Units Subcutaneous QHS   levothyroxine   75 mcg Oral Q0600   pantoprazole   40 mg Oral Daily   Continuous Infusions:  magnesium  sulfate bolus IVPB     PRN Meds:.acetaminophen  **OR** acetaminophen , ondansetron  **OR** ondansetron  (ZOFRAN ) IV    Subjective:  Heather Barber was seen and examined today.  Patient had ongoing dizziness , which has improved compared to yesterday.  No chest pain or sob this morning.   Objective:   Vitals:   10/24/24 0030 10/24/24 0052 10/24/24 0400 10/24/24 0743  BP:  119/87 129/60 (!) 139/48  Pulse:  (!) 58 (!) 58 66  Resp:  18 17 17   Temp: 98.2 F (36.8 C) 97.8 F (36.6 C) 97.9 F (36.6 C) 97.8 F (36.6 C)  TempSrc: Oral Oral Oral Oral  SpO2:  98% 95% 94%  Weight:  74.6 kg    Height:  5' 4 (1.626 m)      Intake/Output Summary (Last 24 hours) at 10/24/2024 1018 Last data filed at 10/24/2024 9374 Gross per 24 hour  Intake 46 ml  Output 450 ml  Net -404 ml   Filed Weights   10/24/24 0052  Weight: 74.6 kg     Exam General exam: Appears calm and comfortable  Respiratory system: Clear to auscultation. Respiratory effort normal. Cardiovascular system: S1 & S2 heard, bradycardic, irregularly irregular.  Gastrointestinal system: Abdomen  is nondistended, soft and nontender. Central nervous system: Alert and oriented. No focal neurological deficits. Extremities: Symmetric 5 x 5 power. Skin: No rashes, lesions or ulcers Psychiatry: Mood & affect appropriate.     Data Reviewed:  I have personally reviewed following labs and imaging studies   CBC Lab Results  Component Value Date   WBC 6.1 10/24/2024   RBC 3.28 (L) 10/24/2024   HGB 11.6 (L) 10/24/2024   HCT 34.1 (L) 10/24/2024   MCV 104.0 (H) 10/24/2024   MCH 35.4 (H) 10/24/2024   PLT 345 10/24/2024   MCHC 34.0 10/24/2024   RDW 13.0 10/24/2024   LYMPHSABS 1.9 07/11/2024   MONOABS 0.5 06/01/2023   EOSABS 0.1 07/11/2024   BASOSABS 0.0 07/11/2024     Last metabolic panel Lab Results  Component Value Date   NA 142 10/24/2024   K 3.4 (L) 10/24/2024   CL 105 10/24/2024   CO2 24 10/24/2024   BUN 17 10/24/2024   CREATININE 0.99 10/24/2024   GLUCOSE 121 (H) 10/24/2024   GFRNONAA 58 (L) 10/24/2024   GFRAA 101 06/16/2020   CALCIUM  9.2 10/24/2024   PROT 6.5 07/11/2024   ALBUMIN 4.4 07/11/2024   LABGLOB 2.1 07/11/2024   AGRATIO 2.1 10/12/2022   BILITOT 0.2 07/11/2024   ALKPHOS 49 07/11/2024   AST 12 07/11/2024   ALT 14 07/11/2024   ANIONGAP 12 10/24/2024    CBG (last 3)  Recent Labs    10/23/24 1820 10/24/24 0101 10/24/24 0619  GLUCAP 136* 130* 106*      Coagulation Profile: Recent Labs  Lab 10/23/24 1758  INR 1.2     Radiology Studies: DG Chest Portable 1 View Result Date: 10/23/2024 EXAM: 1 VIEW(S) XRAY OF THE CHEST 10/23/2024 06:38:43 PM COMPARISON: 11/26/2023 CLINICAL HISTORY: sob FINDINGS: LUNGS AND PLEURA: No focal pulmonary opacity. Basilar scarring. No pleural effusion. No pneumothorax. HEART AND MEDIASTINUM: Sternotomy wires noted. Cardiomegaly, stable. CABG changes. Aortic atherosclerosis. BONES AND SOFT TISSUES: No acute osseous abnormality. IMPRESSION: 1. No acute cardiopulmonary abnormality. Electronically signed by: Norman Gatlin MD 10/23/2024 06:47 PM EST RP Workstation: [id number]       Elgie Butter M.D. [facility] 10/24/2024, 10:18 AM  Available via Epic secure chat 7am-7pm After 7 pm, please refer to night coverage provider listed on amion.    "

## 2024-10-24 NOTE — Progress Notes (Signed)
 "   DAILY PROGRESS NOTE   Patient Name: Heather Barber Date of Encounter: 10/24/2024 Cardiologist: Alvan Carrier, MD  Chief Complaint   Still short of breath  Patient Profile   Heather Barber is a 78 y.o. female with a hx of AF/AFL, HFrecEF, hypothyroidism, HLD, HTN who is being seen 10/23/2024 for the evaluation of lightheadedness and dyspnea at the request of ED.   Subjective   HR improved overnight from 30's up to 60's. Holding amiodarone  and BB. Creatinine improved to 0.99 (from 1.21) - potassium 3.4, magnesium  1.6 (after repletion).  Objective   Vitals:   10/24/24 0030 10/24/24 0052 10/24/24 0400 10/24/24 0743  BP:  119/87 129/60 (!) 139/48  Pulse:  (!) 58 (!) 58 66  Resp:  18 17 17   Temp: 98.2 F (36.8 C) 97.8 F (36.6 C) 97.9 F (36.6 C) 97.8 F (36.6 C)  TempSrc: Oral Oral Oral Oral  SpO2:  98% 95% 94%  Weight:  74.6 kg    Height:  5' 4 (1.626 m)      Intake/Output Summary (Last 24 hours) at 10/24/2024 9178 Last data filed at 10/24/2024 9374 Gross per 24 hour  Intake 46 ml  Output 450 ml  Net -404 ml   Filed Weights   10/24/24 0052  Weight: 74.6 kg    Physical Exam   General appearance: alert and no distress Lungs: clear to auscultation bilaterally Heart: irregularly irregular rhythm Extremities: extremities normal, atraumatic, no cyanosis or edema Neurologic: Grossly normal   Inpatient Medications    Scheduled Meds:  apixaban   5 mg Oral BID   atorvastatin   40 mg Oral QHS   insulin  aspart  0-15 Units Subcutaneous TID WC   insulin  aspart  0-5 Units Subcutaneous QHS   levothyroxine   75 mcg Oral Q0600   pantoprazole   40 mg Oral Daily    Continuous Infusions:   PRN Meds: acetaminophen  **OR** acetaminophen , ondansetron  **OR** ondansetron  (ZOFRAN ) IV   Labs   Results for orders placed or performed during the hospital encounter of 10/23/24 (from the past 48 hours)  Basic metabolic panel     Status: Abnormal   Collection Time:  10/23/24  5:58 PM  Result Value Ref Range   Sodium 141 135 - 145 mmol/L   Potassium 3.3 (L) 3.5 - 5.1 mmol/L   Chloride 103 98 - 111 mmol/L   CO2 24 22 - 32 mmol/L   Glucose, Bld 151 (H) 70 - 99 mg/dL    Comment: Glucose reference range applies only to samples taken after fasting for at least 8 hours.   BUN 20 8 - 23 mg/dL   Creatinine, Ser 8.78 (H) 0.44 - 1.00 mg/dL   Calcium  9.5 8.9 - 10.3 mg/dL   GFR, Estimated 46 (L) >60 mL/min    Comment: (NOTE) Calculated using the CKD-EPI Creatinine Equation (2021)    Anion gap 14 5 - 15    Comment: Performed at [facility], [address] [place], [place] [address]  CBC     Status: Abnormal   Collection Time: 10/23/24  5:58 PM  Result Value Ref Range   WBC 9.1 4.0 - 10.5 K/uL   RBC 3.24 (L) 3.87 - 5.11 MIL/uL   Hemoglobin 11.5 (L) 12.0 - 15.0 g/dL   HCT 65.7 (L) 63.9 - 53.9 %   MCV 105.6 (H) 80.0 - 100.0 fL   MCH 35.5 (H) 26.0 - 34.0 pg   MCHC 33.6 30.0 - 36.0 g/dL   RDW  12.9 11.5 - 15.5 %   Platelets 389 150 - 400 K/uL   nRBC 0.0 0.0 - 0.2 %    Comment: Performed at [facility], [address] [place], [place] [address]  Troponin T, High Sensitivity     Status: Abnormal   Collection Time: 10/23/24  5:58 PM  Result Value Ref Range   Troponin T High Sensitivity 28 (H) 0 - 19 ng/L    Comment: (NOTE) Biotin concentrations > 1000 ng/mL falsely decrease TnT results.  Serial cardiac troponin measurements are suggested.  Refer to the Links section for chest pain algorithms and additional  guidance. Performed at [facility], [address] [place], [place] [address]   Protime-INR (order if Patient is taking Coumadin / Warfarin)     Status: Abnormal   Collection Time: 10/23/24  5:58 PM  Result Value Ref Range   Prothrombin Time 16.0 (H) 11.4 - 15.2 seconds   INR 1.2 0.8 - 1.2    Comment: (NOTE) INR goal varies based on device and disease states. Performed at [facility], [address],  [place], [place] [address]   Pro Brain natriuretic peptide     Status: Abnormal   Collection Time: 10/23/24  5:58 PM  Result Value Ref Range   Pro Brain Natriuretic Peptide 768.0 (H) <300.0 pg/mL    Comment: (NOTE) Age Group        Cut-Points    Interpretation  < 50 years     450 pg/mL       NT-proBNP > 450 pg/mL indicates                                ADHF is likely              50 to 75 years  900 pg/mL      NT-proBNP > 900 pg/mL indicates          ADHF is likely  > 75 years      1800 pg/mL     NT-proBNP > 1800 pg/mL indicates          ADHF is likely                           All ages    Results between       Indeterminate. Further clinical             300 and the cut-   information is needed to determine            point for age group   if ADHF is present.                                                             Elecsys proBNP II/ Elecsys proBNP II STAT           Cut-Point                       Interpretation  300 pg/mL                    NT-proBNP <300pg/mL indicates  ADHF is not likely  Performed at [facility], [address], [place], [place] [address]   Magnesium      Status: Abnormal   Collection Time: 10/23/24  5:58 PM  Result Value Ref Range   Magnesium  1.2 (L) 1.7 - 2.4 mg/dL    Comment: Performed at [facility], [address], [place], [place] [address]  TSH     Status: Abnormal   Collection Time: 10/23/24  5:58 PM  Result Value Ref Range   TSH 8.220 (H) 0.350 - 4.500 uIU/mL    Comment: Performed at [facility], [address], [place], [place] [address]  Hemoglobin A1c     Status: Abnormal   Collection Time: 10/23/24  5:58 PM  Result Value Ref Range   Hgb A1c MFr Bld 6.7 (H) 4.8 - 5.6 %    Comment: (NOTE) Diagnosis of Diabetes The following HbA1c ranges recommended by the American Diabetes Association (ADA) may be used as an aid in the diagnosis of diabetes mellitus.  Hemoglobin              Suggested A1C NGSP%              Diagnosis  <5.7                   Non Diabetic  5.7-6.4                Pre-Diabetic  >6.4                   Diabetic  <7.0                   Glycemic control for                       adults with diabetes.     Mean Plasma Glucose 145.59 mg/dL    Comment: Performed at [facility], [address], [place], [place] [address]  Resp panel by RT-PCR (RSV, Flu A&B, Covid) Anterior Nasal Swab     Status: None   Collection Time: 10/23/24  6:09 PM   Specimen: Anterior Nasal Swab  Result Value Ref Range   SARS Coronavirus 2 by RT PCR NEGATIVE NEGATIVE   Influenza A by PCR NEGATIVE NEGATIVE   Influenza B by PCR NEGATIVE NEGATIVE    Comment: (NOTE) The Xpert Xpress SARS-CoV-2/FLU/RSV plus assay is intended as an aid in the diagnosis of influenza from Nasopharyngeal swab specimens and should not be used as a sole basis for treatment. Nasal washings and aspirates are unacceptable for Xpert Xpress SARS-CoV-2/FLU/RSV testing.  Fact Sheet for Patients: bloggercourse.com  Fact Sheet for Healthcare Providers: seriousbroker.it  This test is not yet approved or cleared by the [place]  FDA and has been authorized for detection and/or diagnosis of SARS-CoV-2 by FDA under an Emergency Use Authorization (EUA). This EUA will remain in effect (meaning this test can be used) for the duration of the COVID-19 declaration under Section 564(b)(1) of the Act, 21 U.S.C. section 360bbb-3(b)(1), unless the authorization is terminated or revoked.     Resp Syncytial Virus by PCR NEGATIVE NEGATIVE    Comment: (NOTE) Fact Sheet for Patients: bloggercourse.com  Fact Sheet for Healthcare Providers: seriousbroker.it  This test is not yet approved or cleared by the [place]  FDA and has been authorized for detection and/or diagnosis of SARS-CoV-2 by FDA under an  Emergency Use Authorization (EUA). This EUA will  remain in effect (meaning this test can be used) for the duration of the COVID-19 declaration under Section 564(b)(1) of the Act, 21 U.S.C. section 360bbb-3(b)(1), unless the authorization is terminated or revoked.  Performed at [facility], [address], [place], [place] [address]   CBG monitoring, ED     Status: Abnormal   Collection Time: 10/23/24  6:20 PM  Result Value Ref Range   Glucose-Capillary 136 (H) 70 - 99 mg/dL    Comment: Glucose reference range applies only to samples taken after fasting for at least 8 hours.  Troponin T, High Sensitivity     Status: Abnormal   Collection Time: 10/23/24  7:58 PM  Result Value Ref Range   Troponin T High Sensitivity 28 (H) 0 - 19 ng/L    Comment: (NOTE) Biotin concentrations > 1000 ng/mL falsely decrease TnT results.  Serial cardiac troponin measurements are suggested.  Refer to the Links section for chest pain algorithms and additional  guidance. Performed at [facility], [address] [place], [place] [address]   MRSA Next Gen by PCR, Nasal     Status: None   Collection Time: 10/24/24 12:45 AM   Specimen: Nasal Mucosa; Nasal Swab  Result Value Ref Range   MRSA by PCR Next Gen NOT DETECTED NOT DETECTED    Comment: (NOTE) The GeneXpert MRSA Assay (FDA approved for NASAL specimens only), is one component of a comprehensive MRSA colonization surveillance program. It is not intended to diagnose MRSA infection nor to guide or monitor treatment for MRSA infections. Test performance is not FDA approved in patients less than 39 years old. Performed at [facility], [address], [place], [place] [address]   Glucose, capillary     Status: Abnormal   Collection Time: 10/24/24  1:01 AM  Result Value Ref Range   Glucose-Capillary 130 (H) 70 - 99 mg/dL    Comment: Glucose reference range applies only to samples taken after fasting for at least 8 hours.   Magnesium      Status: Abnormal   Collection Time: 10/24/24  2:22 AM  Result Value Ref Range   Magnesium  1.6 (L) 1.7 - 2.4 mg/dL    Comment: Performed at [facility], [address], [place], [place] [address]  Basic metabolic panel     Status: Abnormal   Collection Time: 10/24/24  2:22 AM  Result Value Ref Range   Sodium 142 135 - 145 mmol/L   Potassium 3.4 (L) 3.5 - 5.1 mmol/L   Chloride 105 98 - 111 mmol/L   CO2 24 22 - 32 mmol/L   Glucose, Bld 121 (H) 70 - 99 mg/dL    Comment: Glucose reference range applies only to samples taken after fasting for at least 8 hours.   BUN 17 8 - 23 mg/dL   Creatinine, Ser 9.00 0.44 - 1.00 mg/dL   Calcium  9.2 8.9 - 10.3 mg/dL   GFR, Estimated 58 (L) >60 mL/min    Comment: (NOTE) Calculated using the CKD-EPI Creatinine Equation (2021)    Anion gap 12 5 - 15    Comment: Performed at [facility], [address] [place], [place] [address]  CBC     Status: Abnormal   Collection Time: 10/24/24  2:22 AM  Result Value Ref Range   WBC 6.1 4.0 - 10.5 K/uL   RBC 3.28 (L) 3.87 - 5.11 MIL/uL   Hemoglobin 11.6 (L) 12.0 - 15.0 g/dL   HCT  34.1 (L) 36.0 - 46.0 %   MCV 104.0 (H) 80.0 - 100.0 fL   MCH 35.4 (H) 26.0 - 34.0 pg   MCHC 34.0 30.0 - 36.0 g/dL   RDW 86.9 88.4 - 84.4 %   Platelets 345 150 - 400 K/uL   nRBC 0.0 0.0 - 0.2 %    Comment: Performed at [facility], [address], [place], [place] [address]  Glucose, capillary     Status: Abnormal   Collection Time: 10/24/24  6:19 AM  Result Value Ref Range   Glucose-Capillary 106 (H) 70 - 99 mg/dL    Comment: Glucose reference range applies only to samples taken after fasting for at least 8 hours.    ECG   Afib with SVR at 54- Personally Reviewed  Telemetry   Afib with SVR, PVC's - Personally Reviewed  Radiology    DG Chest Portable 1 View Result Date: 10/23/2024 EXAM: 1 VIEW(S) XRAY OF THE CHEST 10/23/2024 06:38:43 PM COMPARISON: 11/26/2023 CLINICAL HISTORY: sob  FINDINGS: LUNGS AND PLEURA: No focal pulmonary opacity. Basilar scarring. No pleural effusion. No pneumothorax. HEART AND MEDIASTINUM: Sternotomy wires noted. Cardiomegaly, stable. CABG changes. Aortic atherosclerosis. BONES AND SOFT TISSUES: No acute osseous abnormality. IMPRESSION: 1. No acute cardiopulmonary abnormality. Electronically signed by: Norman Gatlin MD 10/23/2024 06:47 PM EST RP Workstation: [id number]    Cardiac Studies   Echo pending  Assessment   Principal Problem:   Bradycardia Active Problems:   Lightheadedness   AV block, Mobitz 1   AV block, 1st degree   Plan   HR improved from the upper 20-30's to the 60's today, but still in afib with SVR at times and PVC's. Echo scheduled today. Will order additional 4G magnesium  and potassium 40 MEQ today. Consider EP consult if no improvement - ?pursing DCCV if she is symptomatic from afib or just slow VR.  Time Spent Directly with Patient:  I have spent a total of 25 minutes with the patient reviewing hospital notes, telemetry, EKGs, labs and examining the patient as well as establishing an assessment and plan that was discussed personally with the patient.  > 50% of time was spent in direct patient care.  Length of Stay:  LOS: 1 day   Vinie KYM Maxcy, MD, [facility], FNLA, FACP  [place]  [facility]  Medical Director of the [facility] Lipid Disorders &  Cardiovascular Risk Reduction Clinic Diplomate of the American Board of Clinical Lipidology Attending Cardiologist  Direct Dial: [phone]  Fax: [phone]  Website:  [email or web address]  Vinie BROCKS Carnita Golob 10/24/2024, 8:21 AM   "

## 2024-10-24 NOTE — Progress Notes (Signed)
" °  Echocardiogram 2D Echocardiogram has been performed.  Koleen KANDICE Popper, RDCS 10/24/2024, 9:48 AM "

## 2024-10-25 DIAGNOSIS — I441 Atrioventricular block, second degree: Secondary | ICD-10-CM

## 2024-10-25 DIAGNOSIS — R001 Bradycardia, unspecified: Secondary | ICD-10-CM | POA: Diagnosis not present

## 2024-10-25 DIAGNOSIS — R42 Dizziness and giddiness: Secondary | ICD-10-CM | POA: Diagnosis not present

## 2024-10-25 LAB — COMPREHENSIVE METABOLIC PANEL WITH GFR
ALT: 15 U/L (ref 0–44)
AST: 19 U/L (ref 15–41)
Albumin: 4 g/dL (ref 3.5–5.0)
Alkaline Phosphatase: 53 U/L (ref 38–126)
Anion gap: 6 (ref 5–15)
BUN: 15 mg/dL (ref 8–23)
CO2: 30 mmol/L (ref 22–32)
Calcium: 8.7 mg/dL — ABNORMAL LOW (ref 8.9–10.3)
Chloride: 107 mmol/L (ref 98–111)
Creatinine, Ser: 0.98 mg/dL (ref 0.44–1.00)
GFR, Estimated: 59 mL/min — ABNORMAL LOW
Glucose, Bld: 126 mg/dL — ABNORMAL HIGH (ref 70–99)
Potassium: 4.7 mmol/L (ref 3.5–5.1)
Sodium: 143 mmol/L (ref 135–145)
Total Bilirubin: 0.4 mg/dL (ref 0.0–1.2)
Total Protein: 6.3 g/dL — ABNORMAL LOW (ref 6.5–8.1)

## 2024-10-25 LAB — CBC WITH DIFFERENTIAL/PLATELET
Abs Immature Granulocytes: 0.03 K/uL (ref 0.00–0.07)
Basophils Absolute: 0.1 K/uL (ref 0.0–0.1)
Basophils Relative: 1 %
Eosinophils Absolute: 0.1 K/uL (ref 0.0–0.5)
Eosinophils Relative: 2 %
HCT: 34 % — ABNORMAL LOW (ref 36.0–46.0)
Hemoglobin: 11.5 g/dL — ABNORMAL LOW (ref 12.0–15.0)
Immature Granulocytes: 1 %
Lymphocytes Relative: 31 %
Lymphs Abs: 1.8 K/uL (ref 0.7–4.0)
MCH: 35.5 pg — ABNORMAL HIGH (ref 26.0–34.0)
MCHC: 33.8 g/dL (ref 30.0–36.0)
MCV: 104.9 fL — ABNORMAL HIGH (ref 80.0–100.0)
Monocytes Absolute: 0.5 K/uL (ref 0.1–1.0)
Monocytes Relative: 8 %
Neutro Abs: 3.2 K/uL (ref 1.7–7.7)
Neutrophils Relative %: 57 %
Platelets: 346 K/uL (ref 150–400)
RBC: 3.24 MIL/uL — ABNORMAL LOW (ref 3.87–5.11)
RDW: 12.7 % (ref 11.5–15.5)
WBC: 5.6 K/uL (ref 4.0–10.5)
nRBC: 0 % (ref 0.0–0.2)

## 2024-10-25 LAB — GLUCOSE, CAPILLARY
Glucose-Capillary: 111 mg/dL — ABNORMAL HIGH (ref 70–99)
Glucose-Capillary: 98 mg/dL (ref 70–99)

## 2024-10-25 LAB — PHOSPHORUS: Phosphorus: 2.9 mg/dL (ref 2.5–4.6)

## 2024-10-25 LAB — T4, FREE: Free T4: 1.23 ng/dL (ref 0.80–2.00)

## 2024-10-25 LAB — C DIFFICILE QUICK SCREEN W PCR REFLEX
C Diff antigen: NEGATIVE
C Diff interpretation: NOT DETECTED
C Diff toxin: NEGATIVE

## 2024-10-25 LAB — MAGNESIUM: Magnesium: 2.1 mg/dL (ref 1.7–2.4)

## 2024-10-25 NOTE — Progress Notes (Signed)
 " Cardiology Consultation:   Patient ID: CHARLOTTA Barber MRN: [id number]; DOB: 1946/02/26  Admit date: 10/23/2024 Date of Consult: 10/25/2024  Primary Care Provider: Dettinger, Fonda LABOR, MD [facility] Cardiologist: Alvan Carrier, MD  [facility] Electrophysiologist: Adina Primus, MD   Patient Profile:   Heather Barber is a 78 y.o. female with persistent AF/AFL, 1' AVB, LBBB, HFrecEF, hypothyroidism, HLD, HTN who is evaluated for sx AF/SVR and sx bradycardia.    Interval Events:   Feeling much better in NSR now.  Now that metoprolol  has washed out and no longer on amiodarone .  Discussed different options for management of her atrial arrhythmias in the setting of SSS.  I spoke with her family over the phone as well.  Past Medical History:  Diagnosis Date   (HFpEF) heart failure with preserved ejection fraction ([name])    a. EF previously 30-35% in 02/2021 in the setting of afib w/RVR --> normalized by repeat imaging in 05/2021.   Anxiety    Atrial flutter ([name])    a. s/p DCCV in 02/2021   Atrial septal defect    Surgical repair in 1994   Cardiomyopathy [facility])    Chest pain    Normal coronary angiography in 1994 and 2011   Dizziness    Endometrial cancer, grade I ([name]) 10/09/2019   Essential thrombocythemia ([name]) 06/06/2016   GERD (gastroesophageal reflux disease)    History of kidney stones    Hyperlipidemia    Hypertension    LBBB (left bundle branch block) 2011   Mitral regurgitation    Palpitations    Persistent atrial fibrillation ([name])    PONV (postoperative nausea and vomiting)    Syncope    Tricuspid regurgitation    Type 2 diabetes mellitus with other specified complication ([name]) 07/18/2019   Ventricular tachycardia ([name])    Exercise induced   Past Surgical History:  Procedure Laterality Date   APPENDECTOMY     ASD REPAIR  1994   [facility]   BREAST EXCISIONAL BIOPSY     Right and left   BREAST SURGERY     right and left breast-benign    CARDIAC CATHETERIZATION  12/24/2009   Dr. Wonda    CARDIOVERSION N/A 03/04/2021   Procedure: CARDIOVERSION;  Surgeon: Alvan Carrier FALCON, MD;  Location: [facility];  Service: Endoscopy;  Laterality: N/A;   CATARACT EXTRACTION W/PHACO Left 03/11/2013   Procedure: CATARACT EXTRACTION PHACO AND INTRAOCULAR LENS PLACEMENT (IOC);  Surgeon: Oneil T. Roz, MD;  Location: [facility];  Service: Ophthalmology;  Laterality: Left;  CDE:  12.20   HYSTEROSCOPY WITH D & C N/A 11/11/2019   Procedure: DILATATION AND CURETTAGE /HYSTEROSCOPY;  Surgeon: Edsel Norleen GAILS, MD;  Location: [facility];  Service: Gynecology;  Laterality: N/A;   POLYPECTOMY N/A 11/11/2019   Procedure: POLYPECTOMY(ENDOMETRIAL POLYP);  Surgeon: Edsel Norleen GAILS, MD;  Location: [facility];  Service: Gynecology;  Laterality: N/A;   ROBOTIC ASSISTED LAPAROSCOPIC HYSTERECTOMY AND SALPINGECTOMY Bilateral 12/09/2019   Procedure: XI ROBOTIC ASSISTED LAPAROSCOPIC HYSTERECTOMY BILATERAL SALPINGOOOPHORECTOMY;  Surgeon: Eloy Herring, MD;  Location: [facility];  Service: Gynecology;  Laterality: Bilateral;   SENTINEL NODE BIOPSY N/A 12/09/2019   Procedure: SENTINEL LYMPH  NODE BIOPSY;  Surgeon: Eloy Herring, MD;  Location: [facility];  Service: Gynecology;  Laterality: N/A;   TEE WITHOUT CARDIOVERSION N/A 03/04/2021   Procedure: TRANSESOPHAGEAL ECHOCARDIOGRAM (TEE);  Surgeon: Alvan Carrier FALCON, MD;  Location: [facility];  Service: Endoscopy;  Laterality: N/A;   TONSILLECTOMY  TUBAL LIGATION     Bilateral   Inpatient Medications: Scheduled Meds:  apixaban   5 mg Oral BID   atorvastatin   40 mg Oral QHS   cycloSPORINE   1 drop Both Eyes BID   fenofibrate   160 mg Oral Daily   hydroxyurea   500 mg Oral Daily   insulin  aspart  0-15 Units Subcutaneous TID WC   insulin  aspart  0-5 Units Subcutaneous QHS   levothyroxine   75 mcg Oral Q0600   pantoprazole   40 mg Oral Daily   prednisoLONE  acetate  1 drop Both Eyes BID   Continuous Infusions:  PRN Meds: acetaminophen  **OR**  acetaminophen , ondansetron  **OR** ondansetron  (ZOFRAN ) IV  Allergies:   Allergies[1]  Social History:   Social History   Socioeconomic History   Marital status: Widowed    Spouse name: widowed   Number of children: 3   Years of education: Not on file   Highest education level: Not on file  Occupational History   Occupation: Production designer, theatre/television/film shop  Tobacco Use   Smoking status: Never   Smokeless tobacco: Never  Vaping Use   Vaping status: Never Used  Substance and Sexual Activity   Alcohol use: No   Drug use: No   Sexual activity: Not Currently    Birth control/protection: Post-menopausal, Surgical    Comment: hyst  Other Topics Concern   Not on file  Social History Narrative   Married and resides in [place]. 3 children. 3 grandchildren.   Sedentary lifestyle   Social Drivers of Health   Tobacco Use: Low Risk (10/24/2024)   Patient History    Smoking Tobacco Use: Never    Smokeless Tobacco Use: Never    Passive Exposure: Not on file  Financial Resource Strain: Low Risk (03/29/2023)   Overall Financial Resource Strain (CARDIA)    Difficulty of Paying Living Expenses: Not hard at all  Food Insecurity: No Food Insecurity (10/24/2024)   Epic    Worried About Radiation Protection Practitioner of Food in the Last Year: Never true    Ran Out of Food in the Last Year: Never true  Transportation Needs: No Transportation Needs (10/24/2024)   Epic    Lack of Transportation (Medical): No    Lack of Transportation (Non-Medical): No  Physical Activity: Insufficiently Active (03/29/2023)   Exercise Vital Sign    Days of Exercise per Week: 1 day    Minutes of Exercise per Session: 10 min  Stress: No Stress Concern Present (03/29/2023)   [employer] of Occupational Health - Occupational Stress Questionnaire    Feeling of Stress : Not at all  Social Connections: Moderately Isolated (10/24/2024)   Social Connection and Isolation Panel    Frequency of Communication with Friends and Family: Twice a  week    Frequency of Social Gatherings with Friends and Family: Once a week    Attends Religious Services: 1 to 4 times per year    Active Member of [employer] or Organizations: No    Attends Banker Meetings: Never    Marital Status: Widowed  Intimate Partner Violence: Not At Risk (10/24/2024)   Epic    Fear of Current or Ex-Partner: No    Emotionally Abused: No    Physically Abused: No    Sexually Abused: No  Depression (PHQ2-9): Low Risk (10/09/2024)   Depression (PHQ2-9)    PHQ-2 Score: 0  Alcohol Screen: Low Risk (03/29/2023)   Alcohol Screen    Last Alcohol Screening Score (AUDIT): 0  Housing: Low Risk (10/24/2024)  Epic    Unable to Pay for Housing in the Last Year: No    Number of Times Moved in the Last Year: 0    Homeless in the Last Year: No  Utilities: Not At Risk (10/24/2024)   Epic    Threatened with loss of utilities: No  Health Literacy: Not on file    Family History:    Family History  Problem Relation Age of Onset   Cancer Mother        female   Heart failure Father    Stroke Sister    Cancer Sister        breast   Coronary artery disease Neg Hx     ROS:  Review of Systems: [y] = yes, [ ]  = no      General: Weight gain [ ] ; Weight loss [ ] ; Anorexia [ ] ; Fatigue [ ] ; Fever [ ] ; Chills [ ] ; Weakness [ ]    Cardiac: Chest pain/pressure [ ] ; Resting SOB [ ] ; Exertional SOB [ ] ; Orthopnea [ ] ; Pedal Edema [ ] ; Palpitations [ ] ; Syncope [ ] ; Presyncope [ ] ; Paroxysmal nocturnal dyspnea [ ]    Pulmonary: Cough [ ] ; Wheezing [ ] ; Hemoptysis [ ] ; Sputum [ ] ; Snoring [ ]    GI: Vomiting [ ] ; Dysphagia [ ] ; Melena [ ] ; Hematochezia [ ] ; Heartburn [ ] ; Abdominal pain [ ] ; Constipation [ ] ; Diarrhea [ ] ; BRBPR [ ]    GU: Hematuria [ ] ; Dysuria [ ] ; Nocturia [ ]  Vascular: Pain in legs with walking [ ] ; Pain in feet with lying flat [ ] ; Non-healing sores [ ] ; Stroke [ ] ; TIA [ ] ; Slurred speech [ ] ;   Neuro: Headaches [ ] ; Vertigo [ ] ; Seizures [ ] ;  Paresthesias [ ] ;Blurred vision [ ] ; Diplopia [ ] ; Vision changes [ ]    Ortho/Skin: Arthritis [ ] ; Joint pain [ ] ; Muscle pain [ ] ; Joint swelling [ ] ; Back Pain [ ] ; Rash [ ]    Psych: Depression [ ] ; Anxiety [ ]    Heme: Bleeding problems [ ] ; Clotting disorders [ ] ; Anemia [ ]    Endocrine: Diabetes [ ] ; Thyroid  dysfunction [ ]    Physical Exam/Data:   Vitals:   10/24/24 1926 10/24/24 2323 10/25/24 0323 10/25/24 0810  BP: (!) 147/70 (!) 139/54 (!) 165/63 132/74  Pulse: (!) 59 62 64 74  Resp: 17 18 14 15   Temp: 98.5 F (36.9 C) 98 F (36.7 C) 97.7 F (36.5 C) 98 F (36.7 C)  TempSrc: Oral Oral Oral Oral  SpO2: 96% 99% 96% 96%  Weight:      Height:        Intake/Output Summary (Last 24 hours) at 10/25/2024 1012 Last data filed at 10/24/2024 1900 Gross per 24 hour  Intake 340 ml  Output 1300 ml  Net -960 ml      10/24/2024   12:52 AM 10/09/2024    2:41 PM 09/01/2024   11:14 AM  Last 3 Weights  Weight (lbs) 164 lb 7.4 oz 169 lb 163 lb 3.2 oz  Weight (kg) 74.6 kg 76.658 kg 74.027 kg     Body mass index is 28.23 kg/m.  General:  Well nourished, well developed, in no acute distress HEENT: normal Vascular: No carotid bruits; FA pulses 2+ bilaterally without bruits  Cardiac:  normal S1, S2; RRR; 3/6 RUSB systolic murmur  Lungs:  clear to auscultation bilaterally, no wheezing, rhonchi or rales  Abd: soft, nontender, no hepatomegaly  Ext: no edema  Musculoskeletal:  No deformities, BUE and BLE strength normal and equal Skin: warm and dry  Neuro:  CNs 2-12 intact, no focal abnormalities noted Psych:  Normal affect   EKG:  The EKG was personally reviewed and demonstrates: 10/23/24 with SB 54, PR 360, QRS 142, QT/c 460/436, frequent PACs, LBBB/IVCD  Telemetry: Telemetry was personally reviewed and demonstrates: SB->NSR with 1' AVB 60-70s   Relevant CV Studies:  TTE  Result date: 10/24/24  1. Left ventricular ejection fraction, by estimation, is 55 to 60%. The  left  ventricle has normal function. The left ventricle has no regional  wall motion abnormalities. There is mild left ventricular hypertrophy.  Left ventricular diastolic parameters  are indeterminate.   2. Right ventricular systolic function is mildly reduced. The right  ventricular size is mildly enlarged. There is mildly elevated pulmonary  artery systolic pressure. The estimated right ventricular systolic  pressure is 37.4 mmHg.   3. Left atrial size was mildly dilated.   4. Right atrial size was mildly dilated.   5. The mitral valve is degenerative. Moderate mitral valve regurgitation.  No evidence of mitral stenosis.   6. Tricuspid valve regurgitation is moderate to severe.   7. The aortic valve is tricuspid. There is mild calcification of the  aortic valve. Aortic valve regurgitation is not visualized. Aortic valve  sclerosis/calcification is present, without any evidence of aortic  stenosis.   8. The inferior vena cava is dilated in size with >50% respiratory  variability, suggesting right atrial pressure of 8 mmHg.   Laboratory Data:  Chemistry Recent Labs  Lab 10/23/24 1758 10/24/24 0222 10/25/24 0232  NA 141 142 143  K 3.3* 3.4* 4.7  CL 103 105 107  CO2 24 24 30   GLUCOSE 151* 121* 126*  BUN 20 17 15   CREATININE 1.21* 0.99 0.98  CALCIUM  9.5 9.2 8.7*  GFRNONAA 46* 58* 59*  ANIONGAP 14 12 6     Recent Labs  Lab 10/25/24 0232  PROT 6.3*  ALBUMIN 4.0  AST 19  ALT 15  ALKPHOS 53  BILITOT 0.4   Hematology Recent Labs  Lab 10/23/24 1758 10/24/24 0222 10/25/24 0232  WBC 9.1 6.1 5.6  RBC 3.24* 3.28* 3.24*  HGB 11.5* 11.6* 11.5*  HCT 34.2* 34.1* 34.0*  MCV 105.6* 104.0* 104.9*  MCH 35.5* 35.4* 35.5*  MCHC 33.6 34.0 33.8  RDW 12.9 13.0 12.7  PLT 389 345 346   BNP Recent Labs  Lab 10/23/24 1758  PROBNP 768.0*    Radiology/Studies:  ECHOCARDIOGRAM COMPLETE Result Date: 10/24/2024    ECHOCARDIOGRAM REPORT   Patient Name:   WYNTER ISAACS Date of Exam:  10/24/2024 Medical Rec #:  [id number]      Height:       64.0 in Accession #:    [id number]     Weight:       164.5 lb Date of Birth:  January 31, 1946      BSA:          1.800 m Patient Age:    78 years       BP:           139/48 mmHg Patient Gender: F              HR:           72 bpm. Exam Location:  Inpatient Procedure: 2D Echo, Cardiac Doppler and Color Doppler (Both Spectral and Color            Flow Doppler were  utilized during procedure). Indications:    Dyspnea R06.00  History:        Patient has prior history of Echocardiogram examinations, most                 recent 06/26/2023. Arrythmias:AV Block, Signs/Symptoms:Dyspnea;                 Risk Factors:Hypertension, Dyslipidemia and Diabetes.  Sonographer:    Koleen Popper RDCS Referring Phys: HUSAM M SALAH IMPRESSIONS  1. Left ventricular ejection fraction, by estimation, is 55 to 60%. The left ventricle has normal function. The left ventricle has no regional wall motion abnormalities. There is mild left ventricular hypertrophy. Left ventricular diastolic parameters are indeterminate.  2. Right ventricular systolic function is mildly reduced. The right ventricular size is mildly enlarged. There is mildly elevated pulmonary artery systolic pressure. The estimated right ventricular systolic pressure is 37.4 mmHg.  3. Left atrial size was mildly dilated.  4. Right atrial size was mildly dilated.  5. The mitral valve is degenerative. Moderate mitral valve regurgitation. No evidence of mitral stenosis.  6. Tricuspid valve regurgitation is moderate to severe.  7. The aortic valve is tricuspid. There is mild calcification of the aortic valve. Aortic valve regurgitation is not visualized. Aortic valve sclerosis/calcification is present, without any evidence of aortic stenosis.  8. The inferior vena cava is dilated in size with >50% respiratory variability, suggesting right atrial pressure of 8 mmHg. FINDINGS  Left Ventricle: Left ventricular ejection fraction, by  estimation, is 55 to 60%. The left ventricle has normal function. The left ventricle has no regional wall motion abnormalities. The left ventricular internal cavity size was normal in size. There is  mild left ventricular hypertrophy. Left ventricular diastolic parameters are indeterminate. Right Ventricle: The right ventricular size is mildly enlarged. No increase in right ventricular wall thickness. Right ventricular systolic function is mildly reduced. There is mildly elevated pulmonary artery systolic pressure. The tricuspid regurgitant  velocity is 2.71 m/s, and with an assumed right atrial pressure of 8 mmHg, the estimated right ventricular systolic pressure is 37.4 mmHg. Left Atrium: Left atrial size was mildly dilated. Right Atrium: Right atrial size was mildly dilated. Pericardium: Trivial pericardial effusion is present. Mitral Valve: The mitral valve is degenerative in appearance. Mild to moderate mitral annular calcification. Moderate mitral valve regurgitation. No evidence of mitral valve stenosis. Tricuspid Valve: The tricuspid valve is grossly normal. Tricuspid valve regurgitation is moderate to severe. No evidence of tricuspid stenosis. Aortic Valve: The aortic valve is tricuspid. There is mild calcification of the aortic valve. Aortic valve regurgitation is not visualized. Aortic valve sclerosis/calcification is present, without any evidence of aortic stenosis. Aortic valve mean gradient measures 6.1 mmHg. Aortic valve peak gradient measures 12.6 mmHg. Aortic valve area, by VTI measures 2.83 cm. Pulmonic Valve: The pulmonic valve was normal in structure. Pulmonic valve regurgitation is trivial. No evidence of pulmonic stenosis. Aorta: The aortic root is normal in size and structure. Venous: The inferior vena cava is dilated in size with greater than 50% respiratory variability, suggesting right atrial pressure of 8 mmHg. IAS/Shunts: No atrial level shunt detected by color flow Doppler.  LEFT  VENTRICLE PLAX 2D LVIDd:         4.90 cm LVIDs:         3.40 cm LV PW:         1.10 cm LV IVS:        1.10 cm LVOT diam:     2.10 cm  LV SV:         102 LV SV Index:   57 LVOT Area:     3.46 cm  LV Volumes (MOD) LV vol d, MOD A2C: 143.0 ml LV vol d, MOD A4C: 143.0 ml LV vol s, MOD A2C: 58.9 ml LV vol s, MOD A4C: 49.6 ml LV SV MOD A2C:     84.1 ml LV SV MOD A4C:     143.0 ml LV SV MOD BP:      85.4 ml RIGHT VENTRICLE            IVC RV Basal diam:  4.50 cm    IVC diam: 2.50 cm RV Mid diam:    3.70 cm RV S prime:     8.78 cm/s TAPSE (M-mode): 1.8 cm LEFT ATRIUM             Index        RIGHT ATRIUM           Index LA diam:        5.20 cm 2.89 cm/m   RA Area:     19.60 cm LA Vol (A2C):   52.9 ml 29.38 ml/m  RA Volume:   53.30 ml  29.61 ml/m LA Vol (A4C):   58.2 ml 32.33 ml/m LA Biplane Vol: 57.4 ml 31.88 ml/m  AORTIC VALVE AV Area (Vmax):    2.73 cm AV Area (Vmean):   2.90 cm AV Area (VTI):     2.83 cm AV Vmax:           177.61 cm/s AV Vmean:          116.203 cm/s AV VTI:            0.360 m AV Peak Grad:      12.6 mmHg AV Mean Grad:      6.1 mmHg LVOT Vmax:         140.00 cm/s LVOT Vmean:        97.300 cm/s LVOT VTI:          0.294 m LVOT/AV VTI ratio: 0.82  AORTA Ao Root diam: 2.80 cm Ao Asc diam:  3.20 cm MITRAL VALVE                 TRICUSPID VALVE MV Area VTI: 0.56 cm        TR Peak grad:   29.4 mmHg MV VTI:      1.81 m          TR Vmax:        271.00 cm/s MR Peak grad:   90.6 mmHg MR Vmax:        476.00 cm/s  SHUNTS MR PISA:        1.01 cm     Systemic VTI:  0.29 m MR PISA Radius: 0.40 cm      Systemic Diam: 2.10 cm Soyla Merck MD Electronically signed by Soyla Merck MD Signature Date/Time: 10/24/2024/2:41:58 PM    Final    DG Chest Portable 1 View Result Date: 10/23/2024 EXAM: 1 VIEW(S) XRAY OF THE CHEST 10/23/2024 06:38:43 PM COMPARISON: 11/26/2023 CLINICAL HISTORY: sob FINDINGS: LUNGS AND PLEURA: No focal pulmonary opacity. Basilar scarring. No pleural effusion. No pneumothorax. HEART AND  MEDIASTINUM: Sternotomy wires noted. Cardiomegaly, stable. CABG changes. Aortic atherosclerosis. BONES AND SOFT TISSUES: No acute osseous abnormality. IMPRESSION: 1. No acute cardiopulmonary abnormality. Electronically signed by: Norman Gatlin MD 10/23/2024 06:47 PM EST RP Workstation: [id number]   Assessment and Plan:  Heather Barber  is a 78 y.o. female with persistent AF/AFL, 1' AVB, LBBB, HFrecEF, hypothyroidism, HLD, HTN who is evaluated for sx AF/SVR and sx bradycardia.   AF/SVR Sx bradycardia 1' AVB Mobitz I 2' AVB She remains in NSR in the 60s today and is completely asymptomatic now.  I spoke with her family on the phone and they feel like she is much better today than she has been in the past.  I reviewed the telemetry which shows low amplitude P waves and sinus bradycardia with significant 1' AVB (PR 360 ms) and intermittent Mobitz I, 2' AVB.  We discussed different options for management of her atrial arrhythmias.  At this point I do not think she can tolerate most antiarrhythmic medications and with her prior presumed tachycardia induced cardiomyopathy I do think that rhythm control was reasonable.  She fortunately has preserved LVEF and only mild left atrial dilation so I think the chance of keeping her in normal rhythm with persistent AF is high.  She understands that ablation is not curative.  We discussed that in the future she still may end up with a PPM for symptomatic bradycardia if her AF proves to be more difficult to control as we are limited for both rate and rhythm control options with her baseline marked 1' AVB.  For now she would like to proceed with scheduling ablation (PVI+CTI tentatively).  She has severe TR on most recent TTE but is otherwise asymptomatic from this.  I should be able to schedule her in the next 2 weeks for ablation.  For right now would leave on uninterrupted apixaban  and discharge without any rate or rhythm control medications.  Stable for discharge from a  cardiology standpoint.  Will coordinate outpatient follow-up and scheduling for ablation.  Discussed treatment options today for AF including antiarrhythmic drug therapy and ablation. Discussed risks, recovery and likelihood of success with each treatment strategy. Risk, benefits, and alternatives to EP study and ablation for afib were discussed. These risks include but are not limited to stroke, bleeding, vascular damage, tamponade, perforation, damage to the esophagus, lungs, phrenic nerve and other structures, worsening renal function, coronary vasospasm and death.  Discussed potential need for repeat ablation procedures and antiarrhythmic drugs after an initial ablation. The patient understands these risk and wishes to proceed.  We will therefore proceed with catheter ablation. Carto, ICE, anesthesia are requested for the procedure.   Pre-procedure details: Carto/ICE/GA-anesthesia, no CT scan pre procedure  Hold apixaban  and all medications the morning of the procedure Take apixaban  and all medications the night prior   [facility] will sign off.   The patient is ready for discharge today from a cardiac standpoint. Medication Recommendations: d/c OP toprol  XL and amiodarone   Other recommendations (labs, testing, etc): none  Follow up as an outpatient: will coordinate EP f/u   For questions or updates, please contact [facility] Please consult [email or web address] for contact info under   Signed, Donnice DELENA Primus, MD  10/25/2024 10:12 AM     [1]  Allergies Allergen Reactions   Codeine Nausea And Vomiting   Morphine  Other (See Comments)    GI symptoms   "

## 2024-10-25 NOTE — Evaluation (Signed)
 Physical Therapy Evaluation Patient Details Name: Heather Barber MRN: [id number] DOB: 1946/08/15 Today's Date: 10/25/2024  History of Present Illness  78 y.o. female presents to [facility] 10/23/24 with SOB with exertion. Pt with symptomatic bradycardia and a-fib.  PMHx: hypertension, type 2 diabetes, hyperlipidemia, endometrial cancer,, A-fib on Eliquis , hypothyroidism  Clinical Impression  PTA pt was independent for mobility with no AD and working 12hrs/day, 5 days a week. Pt reported being at mobility baseline. She was independent for bed mobility and ModI to ambulate 83ft. Distance limited by pt desiring to remain in the room for the evaluation. Discussed energy conservation techniques and walking program with pt verbalizing understanding. Pt has intermittent assist available upon d/c home. Pt feels comfortable d/c home with no further questions/concerns. No further acute or post-acute PT needs identified. Please re-consult if new needs arise.   HR 90s 94-97% SpO2 on RA      If plan is discharge home, recommend the following: Assist for transportation   Can travel by private vehicle    Yes    Equipment Recommendations None recommended by PT     Functional Status Assessment Patient has not had a recent decline in their functional status     Precautions / Restrictions Precautions Precautions: Fall Recall of Precautions/Restrictions: Intact Restrictions Weight Bearing Restrictions Per Provider Order: No      Mobility  Bed Mobility Overal bed mobility: Independent   Transfers Overall transfer level: Independent Equipment used: None   Ambulation/Gait Ambulation/Gait assistance: Modified independent (Device/Increase time) Gait Distance (Feet): 60 Feet Assistive device: None Gait Pattern/deviations: Step-through pattern, Decreased stride length Gait velocity: slightly decr     General Gait Details: steady gait with no UE support and no overt LOB    Balance Overall balance  assessment: Modified Independent       Pertinent Vitals/Pain Pain Assessment Pain Assessment: No/denies pain    Home Living Family/patient expects to be discharged to:: Private residence Living Arrangements: Alone Available Help at Discharge: Family;Available PRN/intermittently Type of Home: House Home Access: Stairs to enter Entrance Stairs-Rails: Right;Left;Can reach both Entrance Stairs-Number of Steps: 2   Home Layout: One level Home Equipment: None      Prior Function Prior Level of Function : Independent/Modified Independent;Working/employed;Driving    Mobility Comments: Ind with no AD, works 12 hours/5 day a week ADLs Comments: Ind     Extremity/Trunk Assessment   Upper Extremity Assessment Upper Extremity Assessment: Overall WFL for tasks assessed    Lower Extremity Assessment Lower Extremity Assessment: Overall WFL for tasks assessed    Cervical / Trunk Assessment Cervical / Trunk Assessment: Normal  Communication   Communication Communication: No apparent difficulties    Cognition Arousal: Alert Behavior During Therapy: WFL for tasks assessed/performed   PT - Cognitive impairments: No apparent impairments    Following commands: Intact       Cueing Cueing Techniques: Verbal cues      PT Assessment Patient does not need any further PT services         PT Goals (Current goals can be found in the Care Plan section)  Acute Rehab PT Goals PT Goal Formulation: All assessment and education complete, DC therapy     AM-PAC PT 6 Clicks Mobility  Outcome Measure Help needed turning from your back to your side while in a flat bed without using bedrails?: None Help needed moving from lying on your back to sitting on the side of a flat bed without using bedrails?: None Help needed moving to  and from a bed to a chair (including a wheelchair)?: None Help needed standing up from a chair using your arms (e.g., wheelchair or bedside chair)?: None Help  needed to walk in hospital room?: None Help needed climbing 3-5 steps with a railing? : A Little 6 Click Score: 23    End of Session   Activity Tolerance: Patient tolerated treatment well Patient left: in bed;with call bell/phone within reach Nurse Communication: Mobility status PT Visit Diagnosis: Other abnormalities of gait and mobility (R26.89)    Time: 8951-8895 PT Time Calculation (min) (ACUTE ONLY): 16 min   Charges:   PT Evaluation $PT Eval Low Complexity: 1 Low   PT General Charges $$ ACUTE PT VISIT: 1 Visit       Kate ORN, PT, DPT Secure Chat Preferred  Rehab Office [phone]  Kate BRAVO [name] 10/25/2024, 11:44 AM

## 2024-10-26 LAB — T3, FREE: T3, Free: 2.1 pg/mL (ref 2.0–4.4)

## 2024-10-26 NOTE — Discharge Summary (Signed)
 " Physician Discharge Summary   Patient: Heather Barber MRN: [id number] DOB: [birth year]  Admit date:     10/23/2024  Discharge date: 10/25/2024  Discharge Physician: Elgie Butter   PCP: Dettinger, Fonda LABOR, MD   Recommendations at discharge:  Please follow up with Cardiology as recommended.  Please hold the metoprolol  and amiodarone  on discharge.   Discharge Diagnoses: Principal Problem:   Bradycardia Active Problems:   Lightheadedness   AV block, Mobitz 1   AV block, 1st degree  Resolved Problems:   * No resolved hospital problems. *  Hospital Course: Heather Barber is a 78 y.o. female with medical history significant of hypertension, type 2 diabetes, hyperlipidemia, endometrial cancer,, A-fib on Eliquis , hypothyroidism presented to emergency department with shortness of breath with exertion  COVID flu RSV negative. BNP elevated at 768. Chest x-ray negative. Her potassium and magnesium  replenished in the ED. Cardiology consulted. [facility] hospitalist consulted for symptomatic bradycardia.   Assessment and Plan:     Symptomatic bradycardia Mobitz type 1 AV block Atrial fibrillation with slow ventricular rate.  Patient reported lightheadedness, fatigue and DOE on admission which have all resolved now.[name]  BNP elevated at 768. Chest x-ray negative. COVID flu RSV negative  Currently HR is between 50 to 60's.  Dizziness has resolved.  Cardiology on board.  Patient was on amiodarone  and metoprolol  at home which were both held.  EP consulted and recommendations to follow up with them outpatient for ablation.  Echo reviewed with the patient.        Hypokalemia and hypomagnesemia Replaced.        Type 2 DM On metformin  at home, which is on hold.  Continue with SSI for now.  A1c is 6.7%       Hypertension;  BP parameters are optimal.  Patient on entresto , lasix  , to be resumed on discharge.        Hypothyroidism:  TSH elevated probably from being on amiodarone .  Free  t4 wnl.      Atrial fib with SVR On eliquis  for anti coagulation.  Metoprolol  and amiodarone  are on hold.      Mildly elevated troponin and elevated BNP at 768 No chest pain.        Macrocytic anemia Hemoglobin around 11 and stable.      Chronic diastolic CHF She appears Euvolemic.    Loose BM C diff pcr is negative.  Resolved. Patient denies any nausea, vomiting or abdominal pain.  Only 1 BM on the day of discharge.        Consultants: cardiology, EP Procedures performed: ECHO  Disposition: Home Diet recommendation:  Cardiac diet DISCHARGE MEDICATION: Allergies as of 10/25/2024       Reactions   Codeine Nausea And Vomiting   Morphine  Other (See Comments)   GI symptoms        Medication List     STOP taking these medications    amiodarone  200 MG tablet Commonly known as: PACERONE    cyanocobalamin  1000 MCG tablet   metoprolol  succinate 50 MG 24 hr tablet Commonly known as: TOPROL -XL   naproxen sodium 220 MG tablet Commonly known as: ALEVE       TAKE these medications    apixaban  5 MG Tabs tablet Commonly known as: ELIQUIS  Take 5 mg by mouth 2 (two) times daily.   atorvastatin  40 MG tablet Commonly known as: LIPITOR Take 1 tablet (40 mg total) by mouth at bedtime.   cycloSPORINE  0.05 % ophthalmic emulsion Commonly known as:  RESTASIS  Place 1 drop into both eyes 2 (two) times daily.   fenofibrate  160 MG tablet TAKE 1 TABLET BY MOUTH EVERY DAY   ferrous sulfate  325 (65 FE) MG EC tablet Take 1 tablet (325 mg total) by mouth daily with breakfast.   furosemide  20 MG tablet Commonly known as: LASIX  TAKE 1 TABLET (20 MG TOTAL) BY MOUTH DAILY AS NEEDED (SWELLING). What changed:  how much to take how to take this when to take this additional instructions   hydroxyurea  500 MG capsule Commonly known as: HYDREA  Take 1 capsule (500 mg total) by mouth daily.   levothyroxine  75 MCG tablet Commonly known as: SYNTHROID  Take 1 tablet (75  mcg total) by mouth daily before breakfast.   metFORMIN  500 MG tablet Commonly known as: GLUCOPHAGE  Take 1 tablet (500 mg total) by mouth 2 (two) times daily with a meal.   methenamine  1 g tablet Commonly known as: HIPREX  TAKE 1 TABLET (1 G TOTAL) BY MOUTH 2 (TWO) TIMES DAILY WITH A MEAL.   omeprazole  20 MG capsule Commonly known as: PRILOSEC TAKE 1 CAPSULE BY MOUTH EVERY DAY What changed:  how much to take when to take this   prednisoLONE  acetate 1 % ophthalmic suspension Commonly known as: PRED FORTE  Place 1 drop into both eyes 2 (two) times daily.   sacubitril -valsartan  24-26 MG Commonly known as: ENTRESTO  Take 1 tablet by mouth 2 (two) times daily.        Discharge Exam: Filed Weights   10/24/24 0052  Weight: 74.6 kg   General exam: Appears calm and comfortable  Respiratory system: Clear to auscultation. Respiratory effort normal. Cardiovascular system: S1 & S2 heard, RRR.  Gastrointestinal system: Abdomen is nondistended, soft and nontender.  Central nervous system: Alert and oriented. No focal neurological deficits. Extremities: Symmetric 5 x 5 power. Skin: No rashes, lesions or ulcers Psychiatry:  Mood & affect appropriate.    Condition at discharge: fair  The results of significant diagnostics from this hospitalization (including imaging, microbiology, ancillary and laboratory) are listed below for reference.   Imaging Studies: ECHOCARDIOGRAM COMPLETE Result Date: 10/24/2024    ECHOCARDIOGRAM REPORT   Patient Name:   Heather Barber Date of Exam: 10/24/2024 Medical Rec #:  [id number]      Height:       64.0 in Accession #:    [id number]     Weight:       164.5 lb Date of Birth:  09/06/1946      BSA:          1.800 m Patient Age:    78 years       BP:           139/48 mmHg Patient Gender: F              HR:           72 bpm. Exam Location:  Inpatient Procedure: 2D Echo, Cardiac Doppler and Color Doppler (Both Spectral and Color            Flow Doppler were  utilized during procedure). Indications:    Dyspnea R06.00  History:        Patient has prior history of Echocardiogram examinations, most                 recent 06/26/2023. Arrythmias:AV Block, Signs/Symptoms:Dyspnea;                 Risk Factors:Hypertension, Dyslipidemia and Diabetes.  Sonographer:    Koleen Popper  RDCS Referring Phys: HUSAM M SALAH IMPRESSIONS  1. Left ventricular ejection fraction, by estimation, is 55 to 60%. The left ventricle has normal function. The left ventricle has no regional wall motion abnormalities. There is mild left ventricular hypertrophy. Left ventricular diastolic parameters are indeterminate.  2. Right ventricular systolic function is mildly reduced. The right ventricular size is mildly enlarged. There is mildly elevated pulmonary artery systolic pressure. The estimated right ventricular systolic pressure is 37.4 mmHg.  3. Left atrial size was mildly dilated.  4. Right atrial size was mildly dilated.  5. The mitral valve is degenerative. Moderate mitral valve regurgitation. No evidence of mitral stenosis.  6. Tricuspid valve regurgitation is moderate to severe.  7. The aortic valve is tricuspid. There is mild calcification of the aortic valve. Aortic valve regurgitation is not visualized. Aortic valve sclerosis/calcification is present, without any evidence of aortic stenosis.  8. The inferior vena cava is dilated in size with >50% respiratory variability, suggesting right atrial pressure of 8 mmHg. FINDINGS  Left Ventricle: Left ventricular ejection fraction, by estimation, is 55 to 60%. The left ventricle has normal function. The left ventricle has no regional wall motion abnormalities. The left ventricular internal cavity size was normal in size. There is  mild left ventricular hypertrophy. Left ventricular diastolic parameters are indeterminate. Right Ventricle: The right ventricular size is mildly enlarged. No increase in right ventricular wall thickness. Right ventricular  systolic function is mildly reduced. There is mildly elevated pulmonary artery systolic pressure. The tricuspid regurgitant  velocity is 2.71 m/s, and with an assumed right atrial pressure of 8 mmHg, the estimated right ventricular systolic pressure is 37.4 mmHg. Left Atrium: Left atrial size was mildly dilated. Right Atrium: Right atrial size was mildly dilated. Pericardium: Trivial pericardial effusion is present. Mitral Valve: The mitral valve is degenerative in appearance. Mild to moderate mitral annular calcification. Moderate mitral valve regurgitation. No evidence of mitral valve stenosis. Tricuspid Valve: The tricuspid valve is grossly normal. Tricuspid valve regurgitation is moderate to severe. No evidence of tricuspid stenosis. Aortic Valve: The aortic valve is tricuspid. There is mild calcification of the aortic valve. Aortic valve regurgitation is not visualized. Aortic valve sclerosis/calcification is present, without any evidence of aortic stenosis. Aortic valve mean gradient measures 6.1 mmHg. Aortic valve peak gradient measures 12.6 mmHg. Aortic valve area, by VTI measures 2.83 cm. Pulmonic Valve: The pulmonic valve was normal in structure. Pulmonic valve regurgitation is trivial. No evidence of pulmonic stenosis. Aorta: The aortic root is normal in size and structure. Venous: The inferior vena cava is dilated in size with greater than 50% respiratory variability, suggesting right atrial pressure of 8 mmHg. IAS/Shunts: No atrial level shunt detected by color flow Doppler.  LEFT VENTRICLE PLAX 2D LVIDd:         4.90 cm LVIDs:         3.40 cm LV PW:         1.10 cm LV IVS:        1.10 cm LVOT diam:     2.10 cm LV SV:         102 LV SV Index:   57 LVOT Area:     3.46 cm  LV Volumes (MOD) LV vol d, MOD A2C: 143.0 ml LV vol d, MOD A4C: 143.0 ml LV vol s, MOD A2C: 58.9 ml LV vol s, MOD A4C: 49.6 ml LV SV MOD A2C:     84.1 ml LV SV MOD A4C:     143.0  ml LV SV MOD BP:      85.4 ml RIGHT VENTRICLE             IVC RV Basal diam:  4.50 cm    IVC diam: 2.50 cm RV Mid diam:    3.70 cm RV S prime:     8.78 cm/s TAPSE (M-mode): 1.8 cm LEFT ATRIUM             Index        RIGHT ATRIUM           Index LA diam:        5.20 cm 2.89 cm/m   RA Area:     19.60 cm LA Vol (A2C):   52.9 ml 29.38 ml/m  RA Volume:   53.30 ml  29.61 ml/m LA Vol (A4C):   58.2 ml 32.33 ml/m LA Biplane Vol: 57.4 ml 31.88 ml/m  AORTIC VALVE AV Area (Vmax):    2.73 cm AV Area (Vmean):   2.90 cm AV Area (VTI):     2.83 cm AV Vmax:           177.61 cm/s AV Vmean:          116.203 cm/s AV VTI:            0.360 m AV Peak Grad:      12.6 mmHg AV Mean Grad:      6.1 mmHg LVOT Vmax:         140.00 cm/s LVOT Vmean:        97.300 cm/s LVOT VTI:          0.294 m LVOT/AV VTI ratio: 0.82  AORTA Ao Root diam: 2.80 cm Ao Asc diam:  3.20 cm MITRAL VALVE                 TRICUSPID VALVE MV Area VTI: 0.56 cm        TR Peak grad:   29.4 mmHg MV VTI:      1.81 m          TR Vmax:        271.00 cm/s MR Peak grad:   90.6 mmHg MR Vmax:        476.00 cm/s  SHUNTS MR PISA:        1.01 cm     Systemic VTI:  0.29 m MR PISA Radius: 0.40 cm      Systemic Diam: 2.10 cm Soyla Merck MD Electronically signed by Soyla Merck MD Signature Date/Time: 10/24/2024/2:41:58 PM    Final    DG Chest Portable 1 View Result Date: 10/23/2024 EXAM: 1 VIEW(S) XRAY OF THE CHEST 10/23/2024 06:38:43 PM COMPARISON: 11/26/2023 CLINICAL HISTORY: sob FINDINGS: LUNGS AND PLEURA: No focal pulmonary opacity. Basilar scarring. No pleural effusion. No pneumothorax. HEART AND MEDIASTINUM: Sternotomy wires noted. Cardiomegaly, stable. CABG changes. Aortic atherosclerosis. BONES AND SOFT TISSUES: No acute osseous abnormality. IMPRESSION: 1. No acute cardiopulmonary abnormality. Electronically signed by: Norman Gatlin MD 10/23/2024 06:47 PM EST RP Workstation: [id number]    Microbiology: Results for orders placed or performed during the hospital encounter of 10/23/24  Resp panel by RT-PCR (RSV,  Flu A&B, Covid) Anterior Nasal Swab     Status: None   Collection Time: 10/23/24  6:09 PM   Specimen: Anterior Nasal Swab  Result Value Ref Range Status   SARS Coronavirus 2 by RT PCR NEGATIVE NEGATIVE Final   Influenza A by PCR NEGATIVE NEGATIVE Final   Influenza B by PCR NEGATIVE NEGATIVE Final  Comment: (NOTE) The Xpert Xpress SARS-CoV-2/FLU/RSV plus assay is intended as an aid in the diagnosis of influenza from Nasopharyngeal swab specimens and should not be used as a sole basis for treatment. Nasal washings and aspirates are unacceptable for Xpert Xpress SARS-CoV-2/FLU/RSV testing.  Fact Sheet for Patients: bloggercourse.com  Fact Sheet for Healthcare Providers: seriousbroker.it  This test is not yet approved or cleared by the [place]  FDA and has been authorized for detection and/or diagnosis of SARS-CoV-2 by FDA under an Emergency Use Authorization (EUA). This EUA will remain in effect (meaning this test can be used) for the duration of the COVID-19 declaration under Section 564(b)(1) of the Act, 21 U.S.C. section 360bbb-3(b)(1), unless the authorization is terminated or revoked.     Resp Syncytial Virus by PCR NEGATIVE NEGATIVE Final    Comment: (NOTE) Fact Sheet for Patients: bloggercourse.com  Fact Sheet for Healthcare Providers: seriousbroker.it  This test is not yet approved or cleared by the [place]  FDA and has been authorized for detection and/or diagnosis of SARS-CoV-2 by FDA under an Emergency Use Authorization (EUA). This EUA will remain in effect (meaning this test can be used) for the duration of the COVID-19 declaration under Section 564(b)(1) of the Act, 21 U.S.C. section 360bbb-3(b)(1), unless the authorization is terminated or revoked.  Performed at [facility], [address] [place], [place] [address]   MRSA Next Gen by PCR,  Nasal     Status: None   Collection Time: 10/24/24 12:45 AM   Specimen: Nasal Mucosa; Nasal Swab  Result Value Ref Range Status   MRSA by PCR Next Gen NOT DETECTED NOT DETECTED Final    Comment: (NOTE) The GeneXpert MRSA Assay (FDA approved for NASAL specimens only), is one component of a comprehensive MRSA colonization surveillance program. It is not intended to diagnose MRSA infection nor to guide or monitor treatment for MRSA infections. Test performance is not FDA approved in patients less than 38 years old. Performed at [facility], [address] [place], [place] [address]   C Difficile Quick Screen w PCR reflex     Status: None   Collection Time: 10/25/24 10:26 AM   Specimen: STOOL  Result Value Ref Range Status   C Diff antigen NEGATIVE NEGATIVE Final   C Diff toxin NEGATIVE NEGATIVE Final   C Diff interpretation No C. difficile detected.  Final    Comment: Performed at [facility], [address] [place], [place] [address]    Labs: CBC: Recent Labs  Lab 10/23/24 1758 10/24/24 0222 10/25/24 0232  WBC 9.1 6.1 5.6  NEUTROABS  --   --  3.2  HGB 11.5* 11.6* 11.5*  HCT 34.2* 34.1* 34.0*  MCV 105.6* 104.0* 104.9*  PLT 389 345 346   Basic Metabolic Panel: Recent Labs  Lab 10/23/24 1758 10/24/24 0222 10/25/24 0232  NA 141 142 143  K 3.3* 3.4* 4.7  CL 103 105 107  CO2 24 24 30   GLUCOSE 151* 121* 126*  BUN 20 17 15   CREATININE 1.21* 0.99 0.98  CALCIUM  9.5 9.2 8.7*  MG 1.2* 1.6* 2.1  PHOS  --   --  2.9   Liver Function Tests: Recent Labs  Lab 10/25/24 0232  AST 19  ALT 15  ALKPHOS 53  BILITOT 0.4  PROT 6.3*  ALBUMIN 4.0   CBG: Recent Labs  Lab 10/24/24 1105 10/24/24 1639 10/24/24 2124 10/25/24 0612 10/25/24 1135  GLUCAP 102* 113* 126* 111* 98  Discharge time spent: 42 minutes.   Signed: Elgie Butter, MD [facility]  "

## 2024-10-27 ENCOUNTER — Encounter: Payer: Self-pay | Admitting: *Deleted

## 2024-10-27 ENCOUNTER — Telehealth: Payer: Self-pay | Admitting: *Deleted

## 2024-10-27 NOTE — Transitions of Care (Post Inpatient/ED Visit) (Signed)
" ° °  10/27/2024  Name: Heather Barber MRN: [id number] DOB: 1946/02/02  Today's TOC FU Call Status: Today's TOC FU Call Status:: Unsuccessful Call (1st Attempt) Unsuccessful Call (1st Attempt) Date: 10/27/24  Attempted to reach the patient regarding the most recent Inpatient/ED visit.  Follow Up Plan: Additional outreach attempts will be made to reach the patient to complete the Transitions of Care (Post Inpatient/ED visit) call.   Andrea Dimes RN, BSN [place]  [facility] RN Care Manager [phone]  "

## 2024-10-28 ENCOUNTER — Telehealth: Payer: Self-pay | Admitting: *Deleted

## 2024-10-28 NOTE — Transitions of Care (Post Inpatient/ED Visit) (Signed)
" ° °  10/28/2024  Name: Heather Barber MRN: [id number] DOB: [birth year]  Today's TOC FU Call Status: Today's TOC FU Call Status:: Unsuccessful Call (2nd Attempt) Unsuccessful Call (2nd Attempt) Date: 10/28/24  Attempted to reach the patient regarding the most recent Inpatient/ED visit.  Follow Up Plan: Additional outreach attempts will be made to reach the patient to complete the Transitions of Care (Post Inpatient/ED visit) call.   Mliss Creed [facility], BSN RN Care Manager/ Transition of Care [place]/ [facility] [phone]  "

## 2024-10-29 ENCOUNTER — Telehealth: Payer: Self-pay

## 2024-10-29 NOTE — Patient Instructions (Signed)
 Visit Information  Thank you for taking time to visit with me today. Please don't hesitate to contact me if I can be of assistance to you.  Patient instructions. Keep follow up appointment with providers Take medications as prescribed Notify your provider for any ongoing/ new symptoms Seek emergency medical care for dizziness, fainting, chest pain, difficulty breathing, confusion Continue to monitor pulse/ blood pressure as recommended by your provider.    Patient verbalizes understanding of instructions and care plan provided today and agrees to view in MyChart. Active MyChart status and patient understanding of how to access instructions and care plan via MyChart confirmed with patient.     The patient has been provided with contact information for the care management team and has been advised to call with any health related questions or concerns.   Please call the care guide team at [phone] if you need to cancel or reschedule your appointment.   Please call the Suicide and Crisis Lifeline: 988 call the [place]  National Suicide Prevention Lifeline: (901) 572-0657 or TTY: (618)128-8009 TTY 915 212 4478) to talk to a trained counselor call 1-800-273-TALK (toll free, 24 hour hotline) if you are experiencing a Mental Health or Behavioral Health Crisis or need someone to talk to.  Arvin Seip RN, BSN, CCM [employer], Population Health Case Manager Phone: [phone]

## 2024-10-29 NOTE — Transitions of Care (Post Inpatient/ED Visit) (Signed)
 "  10/29/2024  Name: Heather Barber MRN: [id number] DOB: 1946/06/07  Today's TOC FU Call Status: Today's TOC FU Call Status:: Successful TOC FU Call Completed TOC FU Call Complete Date: 10/29/24  Patient's Name and Date of Birth confirmed. Name, DOB  Transition Care Management Follow-up Telephone Call Date of Discharge: 10/25/24 Discharge Facility: [facility]) Type of Discharge: Inpatient Admission Primary Inpatient Discharge Diagnosis:: Bradycardia How have you been since you were released from the hospital?: Better Any questions or concerns?: No  Items Reviewed: Did you receive and understand the discharge instructions provided?: Yes Medications obtained,verified, and reconciled?: Yes (Medications Reviewed) Any new allergies since your discharge?: No Dietary orders reviewed?: Yes Type of Diet Ordered:: Cardiac diet Do you have support at home?: Yes People in Home [RPT]: child(ren), adult Name of Support/Comfort Primary Source: Heather Barber  Medications Reviewed Today: Medications Reviewed Today     Reviewed by Heather Stolz E, RN (Registered Nurse) on 10/29/24 at 1235  Med List Status: <None>   Medication Order Taking? Sig Documenting Provider Last Dose Status Informant  apixaban  (ELIQUIS ) 5 MG TABS tablet [id number] Yes Take 5 mg by mouth 2 (two) times daily. Provider, Historical, Heather Barber  Active Self, Pharmacy Records  atorvastatin  (LIPITOR) 40 MG tablet [id number] Yes Take 1 tablet (40 mg total) by mouth at bedtime. Dettinger, Heather LABOR, Heather Barber  Active Self, Pharmacy Records  cycloSPORINE  (RESTASIS ) 0.05 % ophthalmic emulsion [id number] Yes Place 1 drop into both eyes 2 (two) times daily. Provider, Historical, Heather Barber  Active Self, Pharmacy Records  fenofibrate  160 MG tablet [id number] Yes TAKE 1 TABLET BY MOUTH EVERY DAY Branch, Heather FALCON, Heather Barber  Active Self, Pharmacy Records  ferrous sulfate  325 [id number] FE) MG EC tablet [id number] Yes Take 1 tablet (325 mg total) by mouth daily with breakfast.  Heather Pleasant CHRISTELLA, Heather Barber  Active Self, Pharmacy Records  furosemide  (LASIX ) 20 MG tablet [id number] Yes TAKE 1 TABLET (20 MG TOTAL) BY MOUTH DAILY AS NEEDED (SWELLING).  Patient taking differently: Take 20 mg by mouth daily. Take one tablet (20mg ) by mouth daily in the morning.  May take an additional tablet (20mg ) by mouth as needed for increased swelling.   Heather Heather FALCON, Heather Barber  Active Self, Pharmacy Records  hydroxyurea  (HYDREA ) 500 MG capsule [id number] Yes Take 1 capsule (500 mg total) by mouth daily. Heather Pleasant CHRISTELLA, Heather Barber  Active Self, Pharmacy Records  levothyroxine  (SYNTHROID ) 75 MCG tablet [id number] Yes Take 1 tablet (75 mcg total) by mouth daily before breakfast. Dettinger, Heather LABOR, Heather Barber  Active Self, Pharmacy Records  metFORMIN  (GLUCOPHAGE ) 500 MG tablet [id number] Yes Take 1 tablet (500 mg total) by mouth 2 (two) times daily with a meal. Dettinger, Heather LABOR, Heather Barber  Active Self, Pharmacy Records  methenamine  (HIPREX ) 1 g tablet [id number] Yes TAKE 1 TABLET (1 G TOTAL) BY MOUTH 2 (TWO) TIMES DAILY WITH A MEAL. Heather Senior, Heather Barber  Active Self, Pharmacy Records  omeprazole  [facility]) 20 MG capsule [id number] Yes TAKE 1 CAPSULE BY MOUTH EVERY DAY  Patient taking differently: Take 20 mg by mouth at bedtime.   Dettinger, Heather LABOR, Heather Barber  Active Self, Pharmacy Records  prednisoLONE  acetate (PRED FORTE ) 1 % ophthalmic suspension [id number] Yes Place 1 drop into both eyes 2 (two) times daily. Provider, Historical, Heather Barber  Active Self, Pharmacy Records  sacubitril -valsartan  (ENTRESTO ) 24-26 MG [id number] Yes Take 1 tablet by mouth 2 (two) times daily. Provider, Historical, Heather Barber  Active Self, Pharmacy Records  Home Care and Equipment/Supplies: Were Home Health Services Ordered?: No Any new equipment or medical supplies ordered?: No  Functional Questionnaire: Do you need assistance with bathing/showering or dressing?: No Do you need assistance with meal preparation?: No Do you need  assistance with eating?: No Do you have difficulty maintaining continence: No Do you need assistance with getting out of bed/getting out of a chair/moving?: No Do you have difficulty managing or taking your medications?: No  Follow up appointments reviewed: PCP Follow-up appointment confirmed?: No (offered to schedule for patient. Patient declined) Specialist Hospital Follow-up appointment confirmed?: Yes Date of Specialist follow-up appointment?: 11/06/24 Follow-Up Specialty Provider:: Dr. Almetta Do you need transportation to your follow-up appointment?: No Do you understand care options if your condition(s) worsen?: Yes-patient verbalized understanding  SDOH Interventions Today    Flowsheet Row Most Recent Value  SDOH Interventions   Food Insecurity Interventions Intervention Not Indicated  Housing Interventions Intervention Not Indicated  Transportation Interventions Intervention Not Indicated  Utilities Interventions Intervention Not Indicated   Discussed and offered 30 day TOC program.  Patient declined.  The patient has been provided with contact information for the care management team and has been advised to call with any health -related questions or concerns.  The patient verbalized understanding with current plan of care.  The patient is directed to their insurance card regarding availability of benefits coverage.    Arvin Seip RN, BSN, CCM [employer], Population Health Case Manager Phone: [phone]  "

## 2024-11-05 NOTE — Pre-Procedure Instructions (Signed)
 Instructed patient on the following items: Arrival time 0515 Nothing to eat or drink after midnight No meds AM of procedure Responsible person to drive you home and stay with you for 24 hrs  Have you missed any doses of anti-coagulant Eliquis- takes twice a day, hasn't missed any dose in last 4 weeks.  Don't take dose morning of procedure.

## 2024-11-06 ENCOUNTER — Ambulatory Visit (HOSPITAL_COMMUNITY): Admitting: Certified Registered Nurse Anesthetist

## 2024-11-06 ENCOUNTER — Ambulatory Visit (HOSPITAL_COMMUNITY)
Admission: RE | Disposition: A | Discharge status: Home / Self Care | Payer: Self-pay | Source: Home / Self Care | Attending: Student in an Organized Health Care Education/Training Program

## 2024-11-06 ENCOUNTER — Other Ambulatory Visit: Payer: Self-pay

## 2024-11-06 ENCOUNTER — Observation Stay (HOSPITAL_COMMUNITY)
Admission: RE | Admit: 2024-11-06 | Discharge: 2024-11-07 | Disposition: A | Discharge status: Home / Self Care | Attending: Student in an Organized Health Care Education/Training Program | Admitting: Student in an Organized Health Care Education/Training Program

## 2024-11-06 ENCOUNTER — Encounter (HOSPITAL_COMMUNITY): Payer: Self-pay | Admitting: Student in an Organized Health Care Education/Training Program

## 2024-11-06 DIAGNOSIS — E039 Hypothyroidism, unspecified: Secondary | ICD-10-CM | POA: Insufficient documentation

## 2024-11-06 DIAGNOSIS — E785 Hyperlipidemia, unspecified: Secondary | ICD-10-CM | POA: Insufficient documentation

## 2024-11-06 DIAGNOSIS — I447 Left bundle-branch block, unspecified: Secondary | ICD-10-CM | POA: Insufficient documentation

## 2024-11-06 DIAGNOSIS — I5030 Unspecified diastolic (congestive) heart failure: Secondary | ICD-10-CM | POA: Insufficient documentation

## 2024-11-06 DIAGNOSIS — F419 Anxiety disorder, unspecified: Secondary | ICD-10-CM | POA: Diagnosis not present

## 2024-11-06 DIAGNOSIS — E119 Type 2 diabetes mellitus without complications: Secondary | ICD-10-CM | POA: Insufficient documentation

## 2024-11-06 DIAGNOSIS — I110 Hypertensive heart disease with heart failure: Secondary | ICD-10-CM

## 2024-11-06 DIAGNOSIS — I4819 Other persistent atrial fibrillation: Secondary | ICD-10-CM

## 2024-11-06 DIAGNOSIS — I4891 Unspecified atrial fibrillation: Principal | ICD-10-CM | POA: Diagnosis present

## 2024-11-06 DIAGNOSIS — Z79899 Other long term (current) drug therapy: Secondary | ICD-10-CM | POA: Diagnosis not present

## 2024-11-06 DIAGNOSIS — R001 Bradycardia, unspecified: Secondary | ICD-10-CM | POA: Diagnosis present

## 2024-11-06 DIAGNOSIS — I5032 Chronic diastolic (congestive) heart failure: Secondary | ICD-10-CM | POA: Insufficient documentation

## 2024-11-06 DIAGNOSIS — I484 Atypical atrial flutter: Secondary | ICD-10-CM | POA: Diagnosis not present

## 2024-11-06 DIAGNOSIS — I509 Heart failure, unspecified: Secondary | ICD-10-CM | POA: Diagnosis not present

## 2024-11-06 DIAGNOSIS — Z7984 Long term (current) use of oral hypoglycemic drugs: Secondary | ICD-10-CM | POA: Insufficient documentation

## 2024-11-06 HISTORY — PX: A-FLUTTER ABLATION: EP1230

## 2024-11-06 HISTORY — PX: ATRIAL TACH ABLATION: EP1192

## 2024-11-06 HISTORY — PX: ATRIAL FIBRILLATION ABLATION: EP1191

## 2024-11-06 LAB — POCT ACTIVATED CLOTTING TIME
Activated Clotting Time: 230 s
Activated Clotting Time: 276 s
Activated Clotting Time: 281 s
Activated Clotting Time: 322 s
Activated Clotting Time: 337 s
Activated Clotting Time: 312 s
Activated Clotting Time: 327 s

## 2024-11-06 LAB — GLUCOSE, CAPILLARY
Glucose-Capillary: 141 mg/dL — ABNORMAL HIGH (ref 70–99)
Glucose-Capillary: 119 mg/dL — ABNORMAL HIGH (ref 70–99)

## 2024-11-06 MED ORDER — ONDANSETRON HCL 4 MG/2ML IJ SOLN
INTRAMUSCULAR | Status: DC | PRN
  Administered 2024-11-06: 4 mg via INTRAVENOUS

## 2024-11-06 MED ORDER — PHENYLEPHRINE HCL-NACL 20-0.9 MG/250ML-% IV SOLN
INTRAVENOUS | Status: DC | PRN
  Administered 2024-11-06: 25 ug/min via INTRAVENOUS

## 2024-11-06 MED ORDER — LIDOCAINE 2% (20 MG/ML) 5 ML SYRINGE
INTRAMUSCULAR | Status: DC | PRN
  Administered 2024-11-06: 60 mg via INTRAVENOUS

## 2024-11-06 MED ORDER — HEPARIN SODIUM (PORCINE) 1000 UNIT/ML IJ SOLN
INTRAMUSCULAR | Status: DC | PRN
  Administered 2024-11-06 (×3): 1000 [IU] via INTRAVENOUS

## 2024-11-06 MED ORDER — PROTAMINE SULFATE 10 MG/ML IV SOLN
INTRAVENOUS | Status: DC | PRN
  Administered 2024-11-06 (×2): 10 mg via INTRAVENOUS

## 2024-11-06 MED ORDER — APIXABAN 5 MG PO TABS
5.0000 mg | ORAL_TABLET | Freq: Two times a day (BID) | ORAL | Status: DC
  Administered 2024-11-06 – 2024-11-07 (×3): 5 mg via ORAL
  Filled 2024-11-06 (×2): qty 1

## 2024-11-06 MED ORDER — LEVOTHYROXINE SODIUM 75 MCG PO TABS
75.0000 ug | ORAL_TABLET | Freq: Every day | ORAL | Status: DC
  Administered 2024-11-07: 75 ug via ORAL
  Filled 2024-11-06: qty 1

## 2024-11-06 MED ORDER — PROPOFOL 10 MG/ML IV BOLUS
INTRAVENOUS | Status: DC | PRN
  Administered 2024-11-06: 150 mg via INTRAVENOUS

## 2024-11-06 MED ORDER — ACETAMINOPHEN 325 MG PO TABS
650.0000 mg | ORAL_TABLET | ORAL | Status: DC | PRN
  Administered 2024-11-06: 650 mg via ORAL
  Filled 2024-11-06: qty 2

## 2024-11-06 MED ORDER — HEPARIN SODIUM (PORCINE) 1000 UNIT/ML IJ SOLN
INTRAMUSCULAR | Status: AC
  Filled 2024-11-06: qty 10

## 2024-11-06 MED ORDER — FENTANYL CITRATE (PF) 100 MCG/2ML IJ SOLN
INTRAMUSCULAR | Status: AC
  Filled 2024-11-06: qty 2

## 2024-11-06 MED ORDER — ATROPINE SULFATE 1 MG/10ML IJ SOSY
PREFILLED_SYRINGE | INTRAMUSCULAR | Status: AC
  Filled 2024-11-06: qty 10

## 2024-11-06 MED ORDER — PHENYLEPHRINE HCL (PRESSORS) 10 MG/ML IV SOLN
INTRAVENOUS | Status: DC | PRN
  Administered 2024-11-06 (×2): 80 ug via INTRAVENOUS
  Administered 2024-11-06 (×2): 160 ug via INTRAVENOUS
  Administered 2024-11-06 (×2): 80 ug via INTRAVENOUS
  Administered 2024-11-06: 160 ug via INTRAVENOUS

## 2024-11-06 MED ORDER — SODIUM CHLORIDE 0.9% FLUSH: 3.0000 mL | INTRAVENOUS | Status: DC | PRN

## 2024-11-06 MED ORDER — ROCURONIUM BROMIDE 10 MG/ML (PF) SYRINGE
PREFILLED_SYRINGE | INTRAVENOUS | Status: DC | PRN
  Administered 2024-11-06: 60 mg via INTRAVENOUS
  Administered 2024-11-06: 20 mg via INTRAVENOUS
  Administered 2024-11-06 (×4): 10 mg via INTRAVENOUS

## 2024-11-06 MED ORDER — APIXABAN 5 MG PO TABS
ORAL_TABLET | ORAL | Status: AC
  Filled 2024-11-06: qty 1

## 2024-11-06 MED ORDER — SODIUM CHLORIDE 0.9 % IV SOLN: INTRAVENOUS | Status: DC

## 2024-11-06 MED ORDER — HEPARIN (PORCINE) IN NACL 1000-0.9 UT/500ML-% IV SOLN
INTRAVENOUS | Status: DC | PRN
  Administered 2024-11-06 (×3): 500 mL

## 2024-11-06 MED ORDER — SODIUM CHLORIDE 0.9 % IV SOLN: 250.0000 mL | INTRAVENOUS | Status: DC | PRN

## 2024-11-06 MED ORDER — ATORVASTATIN CALCIUM 40 MG PO TABS
40.0000 mg | ORAL_TABLET | Freq: Every day | ORAL | Status: DC
  Administered 2024-11-06: 40 mg via ORAL
  Filled 2024-11-06: qty 1

## 2024-11-06 MED ORDER — SODIUM CHLORIDE 0.9% FLUSH
3.0000 mL | Freq: Two times a day (BID) | INTRAVENOUS | Status: DC
  Administered 2024-11-06 – 2024-11-07 (×2): 3 mL via INTRAVENOUS

## 2024-11-06 MED ORDER — SUGAMMADEX SODIUM 200 MG/2ML IV SOLN
INTRAVENOUS | Status: DC | PRN
  Administered 2024-11-06: 200 mg via INTRAVENOUS

## 2024-11-06 MED ORDER — HEPARIN SODIUM (PORCINE) 1000 UNIT/ML IJ SOLN
INTRAMUSCULAR | Status: DC | PRN
  Administered 2024-11-06: 3000 [IU] via INTRAVENOUS
  Administered 2024-11-06 (×2): 5000 [IU] via INTRAVENOUS
  Administered 2024-11-06: 16000 [IU] via INTRAVENOUS
  Administered 2024-11-06: 5000 [IU] via INTRAVENOUS
  Administered 2024-11-06: 3000 [IU] via INTRAVENOUS

## 2024-11-06 MED ORDER — FENTANYL CITRATE (PF) 250 MCG/5ML IJ SOLN
INTRAMUSCULAR | Status: DC | PRN
  Administered 2024-11-06: 100 ug via INTRAVENOUS

## 2024-11-06 NOTE — Plan of Care (Signed)
  Problem: Education: Goal: Knowledge of General Education information will improve Description: Including pain rating scale, medication(s)/side effects and non-pharmacologic comfort measures Outcome: Progressing   Problem: Health Behavior/Discharge Planning: Goal: Ability to manage health-related needs will improve Outcome: Progressing   Problem: Clinical Measurements: Goal: Ability to maintain clinical measurements within normal limits will improve Outcome: Progressing Goal: Will remain free from infection Outcome: Progressing Goal: Diagnostic test results will improve Outcome: Progressing Goal: Respiratory complications will improve Outcome: Progressing Goal: Cardiovascular complication will be avoided Outcome: Progressing   Problem: Activity: Goal: Risk for activity intolerance will decrease Outcome: Progressing   Problem: Nutrition: Goal: Adequate nutrition will be maintained Outcome: Progressing   Problem: Coping: Goal: Level of anxiety will decrease Outcome: Progressing   Problem: Elimination: Goal: Will not experience complications related to bowel motility Outcome: Progressing Goal: Will not experience complications related to urinary retention Outcome: Progressing   Problem: Pain Managment: Goal: General experience of comfort will improve and/or be controlled Outcome: Progressing   Problem: Safety: Goal: Ability to remain free from injury will improve Outcome: Progressing   Problem: Skin Integrity: Goal: Risk for impaired skin integrity will decrease Outcome: Progressing   Problem: Education: Goal: Understanding of disease, treatment, and recovery process will improve Outcome: Progressing   Problem: Activity: Goal: Ability to return to baseline activity level will improve Outcome: Progressing   Problem: Cardiac: Goal: Ability to maintain adequate cardiovascular perfusion will improve Outcome: Progressing Goal: Vascular access site(s) Level 0-1  will be maintained Outcome: Progressing   Problem: Health Behavior/ Discharge Planning: Goal: Ability to safely manage health related needs after discharge Outcome: Progressing

## 2024-11-06 NOTE — Discharge Summary (Addendum)
 "    ELECTROPHYSIOLOGY PROCEDURE DISCHARGE SUMMARY    [name],  MRN: [id number], DOB/AGE: 11/23/1945 79 y.o.  Admit date: 11/06/2024 Discharge date: 11/07/2024  Primary Care Physician: Dettinger, Fonda LABOR, MD  Primary Cardiologist: Alvan Carrier, MD  Electrophysiologist: Dr. Almetta   Primary Discharge Diagnosis:  Atrial Fibrillation Atrial Flutter  Atrial Tachycardia (with prolonged PR & irregularity)  Secondary Discharge Diagnosis:  LBBB  HFrecEF  HTN HLD Hypothyroidism  Procedures This Admission:  1.  Electrophysiology study and Pulse Field catheter ablation of Atrial Fibrillation and RF ablation of AFL and ablation of AT on 11/06/24 by Dr. Almetta .This study demonstrated the following:     1. AT on arrival   2. Successful transseptal puncture x 1 with ICE guidance  3. 3-D mapping of the RA, LA and PV's  4. CTI ablation with RF with widely split doubles and bidirectional block with differential pacing  5. PV isolation ([facility]) using J&J Varipulse PFA  6. Spontaneous mitral annular flutter with termination during ablation (RSPV-MA) 7. Parahisian AT ablation at the [facility] and L anterior septum with suppression but recurrence  8. Persistent CTI bidirectional block at case conclusion      Brief HPI: Heather Barber is a 79 y.o. female with a history of Atrial Fibrillation, Atrial Flutter, & Atrial Tachycardia.  They have failed medical therapy with metoprolol  & amiodarone . Risks, benefits, and alternatives to catheter ablation of Atrial Fibrillation, Atrial Flutter, and Atrial Tachycardia were reviewed with the patient who wished to proceed.   The patient has been on uninterrupted anticoagulation for more than 3 weeks and did not require TEE.  Hospital Course:  The patient was admitted and underwent EPS and ablation of Atrial Fibrillation, Atrial Flutter and Atrial Tachycardia with details as outlined above.  They were monitored on telemetry overnight which  demonstrated Atrial Tachycardia with prolonged PR, irregularity.  Note, she will likely continue to have atrial tachycardia with irregularity - she has historically been asymptomatic with AT (had symptoms with AF/AFL). Groin was without complication on the day of discharge.  The patient was examined and considered to be stable for discharge.  Wound care and restrictions were reviewed with the patient.  The patient will be seen back by [facility] in 4 weeks and Dr. Almetta in 12 weeks for post ablation follow up.   Physical Exam: Vitals:   11/06/24 2036 11/07/24 0023 11/07/24 0523 11/07/24 0906  BP: (!) 114/55 98/77 101/78 111/79  Pulse: (!) 113 (!) 107 (!) 109 (!) 103  Resp: 19 19 18 18   Temp: 98.4 F (36.9 C) 98.9 F (37.2 C) 97.8 F (36.6 C) 97.6 F (36.4 C)  TempSrc: Axillary Oral Oral Oral  SpO2: 93% 95% 97% 100%  Weight:      Height:        GEN- pleasant elderly female in NAD. A&O x 3.  HEENT: Normocephalic, atraumatic Lungs- CTAB, Normal effort.  Heart- RRR, No M/G/R. Groins sites soft / no hematoma, no significant bruising. GI- Soft, NT, ND.  Extremities- No clubbing, cyanosis, or edema;  Skin- warm and dry, no rash or lesion, left chest without hematoma/ecchymosis  Discharge Medications:  Allergies as of 11/07/2024       Reactions   Codeine Nausea And Vomiting   Morphine  Other (See Comments)   GI symptoms        Medication List     TAKE these medications    apixaban  5 MG Tabs tablet Commonly known as: ELIQUIS   Take 5 mg by mouth 2 (two) times daily.   atorvastatin  40 MG tablet Commonly known as: LIPITOR Take 1 tablet (40 mg total) by mouth at bedtime.   cycloSPORINE  0.05 % ophthalmic emulsion Commonly known as: RESTASIS  Place 1 drop into both eyes 2 (two) times daily.   fenofibrate  160 MG tablet TAKE 1 TABLET BY MOUTH EVERY DAY   ferrous sulfate  325 (65 FE) MG EC tablet Take 1 tablet (325 mg total) by mouth daily with breakfast.   furosemide  20 MG  tablet Commonly known as: LASIX  TAKE 1 TABLET (20 MG TOTAL) BY MOUTH DAILY AS NEEDED (SWELLING). What changed:  how much to take how to take this when to take this reasons to take this additional instructions   hydroxyurea  500 MG capsule Commonly known as: HYDREA  Take 1 capsule (500 mg total) by mouth daily.   levothyroxine  75 MCG tablet Commonly known as: SYNTHROID  Take 1 tablet (75 mcg total) by mouth daily before breakfast.   metFORMIN  500 MG tablet Commonly known as: GLUCOPHAGE  Take 1 tablet (500 mg total) by mouth 2 (two) times daily with a meal.   methenamine  1 g tablet Commonly known as: HIPREX  TAKE 1 TABLET (1 G TOTAL) BY MOUTH 2 (TWO) TIMES DAILY WITH A MEAL.   omeprazole  20 MG capsule Commonly known as: PRILOSEC TAKE 1 CAPSULE BY MOUTH EVERY DAY What changed:  how much to take when to take this   prednisoLONE  acetate 1 % ophthalmic suspension Commonly known as: PRED FORTE  Place 1 drop into both eyes 2 (two) times daily.   sacubitril -valsartan  24-26 MG Commonly known as: ENTRESTO  Take 1 tablet by mouth 2 (two) times daily.        Disposition: Home with usual follow up as in AVS  Duration of Discharge Encounter:  APP time: 30 minutes  Signed, Daphne Barrack, NP-C, AGACNP-BC [facility] - Electrophysiology  11/07/2024, 11:04 AM     "

## 2024-11-06 NOTE — Anesthesia Procedure Notes (Signed)
 Procedure Name: Intubation Date/Time: 11/06/2024 7:49 AM  Performed by: Lettie Derrek Dacosta, CRNAPre-anesthesia Checklist: Patient identified, Patient being monitored, Timeout performed, Emergency Drugs available and Suction available Patient Re-evaluated:Patient Re-evaluated prior to induction Oxygen Delivery Method: Circle System Utilized Preoxygenation: Pre-oxygenation with 100% oxygen Induction Type: IV induction Ventilation: Mask ventilation without difficulty Laryngoscope Size: Mac and 4 Grade View: Grade I Tube type: Oral Tube size: 7.5 mm Number of attempts: 1 Airway Equipment and Method: Stylet Placement Confirmation: ETT inserted through vocal cords under direct vision, positive ETCO2 and breath sounds checked- equal and bilateral Secured at: 21 cm Tube secured with: Tape Dental Injury: Teeth and Oropharynx as per pre-operative assessment

## 2024-11-06 NOTE — Interval H&P Note (Signed)
 History and Physical Interval Note:  11/06/2024 7:16 AM  Heather Barber  has presented today for surgery, with the diagnosis of afib.  The various methods of treatment have been discussed with the patient and family. After consideration of risks, benefits and other options for treatment, the patient has consented to  Procedures: ATRIAL FIBRILLATION ABLATION (N/A) as a surgical intervention.  The patient's history has been reviewed, patient examined, no change in status, stable for surgery.  I have reviewed the patient's chart and labs.  Questions were answered to the patient's satisfaction.     Heather Barber

## 2024-11-06 NOTE — Anesthesia Preprocedure Evaluation (Addendum)
 "                                  Anesthesia Evaluation  Patient identified by MRN, date of birth, ID band Patient awake    Reviewed: Allergy & Precautions, NPO status , Patient's Chart, lab work & pertinent test results  History of Anesthesia Complications (+) PONV and history of anesthetic complications  Airway Mallampati: II  TM Distance: >3 FB Neck ROM: Full    Dental no notable dental hx.    Pulmonary neg pulmonary ROS   Pulmonary exam normal        Cardiovascular hypertension, Pt. on medications and Pt. on home beta blockers + dysrhythmias Atrial Fibrillation  Rhythm:Irregular Rate:Normal  ECHO: IMPRESSIONS    1. Left ventricular ejection fraction, by estimation, is 55 to 60%. The left ventricle has normal function. The left ventricle has no regional wall motion abnormalities. There is mild left ventricular hypertrophy. Left ventricular diastolic parameters are indeterminate.  2. Right ventricular systolic function is mildly reduced. The right ventricular size is mildly enlarged. There is mildly elevated pulmonary artery systolic pressure. The estimated right ventricular systolic pressure is 37.4 mmHg.  3. Left atrial size was mildly dilated.  4. Right atrial size was mildly dilated.  5. The mitral valve is degenerative. Moderate mitral valve regurgitation. No evidence of mitral stenosis.  6. Tricuspid valve regurgitation is moderate to severe.  7. The aortic valve is tricuspid. There is mild calcification of the aortic valve. Aortic valve regurgitation is not visualized. Aortic valve sclerosis/calcification is present, without any evidence of aortic stenosis.  8. The inferior vena cava is dilated in size with >50% respiratory variability, suggesting right atrial pressure of 8 mmHg.    Neuro/Psych   Anxiety     negative neurological ROS     GI/Hepatic Neg liver ROS,GERD  ,,  Endo/Other  diabetes, Type 2, Oral Hypoglycemic AgentsHypothyroidism     Renal/GU negative Renal ROS  negative genitourinary   Musculoskeletal negative musculoskeletal ROS (+)    Abdominal Normal abdominal exam  (+)   Peds  Hematology Lab Results      Component                Value               Date                      WBC                      5.6                 10/25/2024                HGB                      11.5 (L)            10/25/2024                HCT                      34.0 (L)            10/25/2024                MCV  104.9 (H)           10/25/2024                PLT                      346                 10/25/2024             Lab Results      Component                Value               Date                      NA                       143                 10/25/2024                K                        4.7                 10/25/2024                CO2                      30                  10/25/2024                GLUCOSE                  126 (H)             10/25/2024                BUN                      15                  10/25/2024                CREATININE               0.98                10/25/2024                CALCIUM                   8.7 (L)             10/25/2024                EGFR                     51 (L)              07/11/2024                GFRNONAA                 59 (L)              10/25/2024  Anesthesia Other Findings   Reproductive/Obstetrics                              Anesthesia Physical Anesthesia Plan  ASA: 3  Anesthesia Plan: General   Post-op Pain Management:    Induction: Intravenous  PONV Risk Score and Plan: 4 or greater and Ondansetron , Dexamethasone  and Treatment may vary due to age or medical condition  Airway Management Planned: Mask and Oral ETT  Additional Equipment: None  Intra-op Plan:   Post-operative Plan: Extubation in OR  Informed Consent: I have reviewed the patients History and Physical, chart, labs  and discussed the procedure including the risks, benefits and alternatives for the proposed anesthesia with the patient or authorized representative who has indicated his/her understanding and acceptance.     Dental advisory given  Plan Discussed with: CRNA  Anesthesia Plan Comments:         Anesthesia Quick Evaluation  "

## 2024-11-06 NOTE — Op Note (Addendum)
 "  Procedure:  Intracardiac catheter ablation AFIB including Transseptal Cath (CPT [phone]) Additional atrial ablation for AFIB (CPT [phone]) x2 Additional atrial ablation of discrete mechanism (CPT [phone])   Pre-Op Diagnosis: Persistent AF/AFL    Post-Op Diagnosis: Persistent AF, typical AFL, atypical mitral annular AFL, paraHisian AT   Procedure Date: 11/06/24   Attending: Adina Primus, MD    Anesthesia: general anesthesia    Initial Intervals: AT, QRS 138, QT 392, RR 513    Procedure: The patient entered the EP lab in a fasting nonsedated state. The procedural time-out was achieved. The patient was placed under general anesthesia by Anesthesiology. Once the patient was draped and prepped in normal fashion with multiple layers of Hibiclens  scrub in the bilateral groins, access to the veins was achieved under ultrasound guidance using the modified Seldinger technique. Following access all sites were pre-closed with Perclose ProStyle suture mediated closure.    Access/sheath: - RFV: 8 Fr short sheath->8.5 Fr Sm curl Vizigo - RFV: 8 Fr short sheath  - LFV: 9 Fr short sheath  - RFA: 8 Fr long sheath   Catheters: - 8.5 Fr SoundStar ICE - Varipulse PFA catheter - OctaRay 3-3-3-3-3-3 mapping catheter  - J&J ST SF D/F Theramcool RF ablation catheter  - Webster CS D/F decapolar catheter   Mapping (RA):  Next, a Webster CS decapolar catheter was advanced to the CS. A Thermacool ablation catheter was advanced to the CTI after CTI contours were obtained with ICE. The patient arrived in a slow AT (TCL 550 ms). Mapping of the AT with the earliest signal at the RV septum adjacent to the His.   Ablation (RA):  Ablation (40 W) was applied from the TV annulus to the IVC along the CTI. Doubles were observed during ablation. CS pacing to lateral CTI was 200 ms. Pacing ablation measured 210 ms to CSP and with movement further lateral from the line measured 180 ms consistent with lateral to medial  block.    Transseptal Access Systemic heparinization was given to maintain ACT's between >350. Transseptal puncture was performed with the VersaCross wire through the Vizigo sheath using ICE and fluoroscopy. The sheath was carefully aspirated and flushed.      Mapping (LA): Next, a deflectable OctaRay was advanced into the left atrium through the Vizigo sheath.  A 3-dimensional electroanatomic map was constructed of the left atrium and pulmonary veins using the Carto mapping system. There was extensive anterior LA scar. The PW had normal voltage but large PV ostia with a  narrow PW width. The AT was mapped to the low anterior septum adjacent to the His.    Ablation (LA): The OctaRay was removed from the Vizigo sheath and the Carto Varipulse PFA catheter was advanced to the LSPV os. The PFA catheter was advanced to each of the 4 pulmonary veins and at least 4 PFA applications were applied per vein. Additional lesions were applied across both carinas and the RPV septum. Following initial ablation the Varipulse catheter was placed within each vein and exit block was observed. Repeat mapping with the OctaRay catheter confirmed first pass isolation in all 4 veins with entrance block as well. The remaining PW was a thin strip of tissue, 10-15 mm. The Wallene was exchanged for the Varipulse catheter an additional lesions were applied along the PW for PWI. Additional ablation was also applied to extend the ablation lesions along the RPV/septum. Following repeat mapping the patient developed an atypical ALF with eccentric CS activation (earliest at CS  1-2). The AFL TCL was faster (260 ms) than that of the previously observed AT. Mapping confirmed mitral annular flutter. The mitral annular flutter switched intermittently between CW and CCW activation sequences. Ablation with RF (40 W, F' 500-550) was applied from the RSPV to the mitral valve through an area of dense scar and low voltage. This resulted in AFL  termination.   Mapping (LA): The patient then had return of the previously observed AT. After discussion with her daughter regarding additional risks of pursuing ablation and the possible need for PPM which we had discussed previously she was in agreement with us  proceeding with ablation of the AT. The ICE catheter was used to obtain aortic valve contours.  RFA access was obtained with ultrasound guidance and with a micropuncture needle. A 5 Fr micropuncture sheath was advanced into the RFA. A guidewire was advanced through this and the sheath was exchanged for an 8 Fr long sheath. The ablation catheter was advanced via retrograde aortic approach to the aortic cusp.  This was then placed in the [facility] and ablation (30-40 W) was applied. It resulted in repeated termination of the AT but took several seconds to terminate. On repeat induction (CSP 250 ms burst pacing) it was easily reinduced.  Despite multiple ablation lesions in the NCC it was still inducible. The OctaRay catheter was removed from the Vizigo in the LA and the ablation catheter was advanced via transseptal access back to the LA. Additional ablation (40 W) was applied immediately opposite the His and the NCC lesions along the LA anterior septum. This again resulted in AT termination but took several seconds to terminate. It remained inducible following these additional ablation lesions and required DCCV (100 J x1) to convert to NSR.   The CTI was checked again with the ablation catheter with persistent bidirectional block observed.    Heparinization was then reversed with protamine . Catheters and sheaths were pulled and hemostasis obtained with Perclose ProStyle sutures. The arterial access was closed with an AngioSeal closure device. No complications were evident. Final ICE assessment without pericardial effusion. The patient was transported to recovery.   Final intervals: NSR, PR 310, QRS 143, QT 461, RR 830     Summary: AT on arrival    Successful transseptal puncture x 1 with ICE guidance  3-D mapping of the RA, LA and PV's   CTI ablation with RF with widely split doubles and bidirectional block with differential pacing  PV isolation ([facility]) using J&J Varipulse PFA  Spontaneous mitral annular flutter with termination during ablation (RSPV-MA) Parahisian AT ablation at the [facility] and L anterior septum with suppression but recurrence  Persistent CTI bidirectional block at case conclusion    Recommendations: Bedrest x 2 hours  Anti-coagulation: resume Eliquis  at 1400    Anti-platelet: none  Anti-arrhythmic: none   Rate control: none Admit overnight observation with prolonged case duration and with incessant AT If AT continues to be an issue then would consider PPM implant to allow for medication management (already with sinus bradycardia, PR >300 ms, IVCD/LBBB)  EP f/u to be scheduled    Donnice DELENA Primus, MD [facility]  Cardiac Electrophysiology  "

## 2024-11-06 NOTE — H&P (Signed)
 " Cardiology Consultation:    Patient ID: APPLE DEARMAS MRN: [id number]; DOB: 1946/07/10   Procedure date: 11/06/24   Primary Care Provider: Dettinger, Fonda LABOR, MD [facility] Cardiologist: Alvan Carrier, MD  [facility] Electrophysiologist: Adina Primus, MD    Patient Profile:    Heather Barber is a 79 y.o. female with persistent AF/AFL, 1' AVB, LBBB, HFrecEF, hypothyroidism, HLD, HTN who is evaluated for sx AF/SVR and sx bradycardia.    Interval Events:    Feeling much better in NSR now.  Now that metoprolol  has washed out and no longer on amiodarone .  Discussed different options for management of her atrial arrhythmias in the setting of SSS.  I spoke with her family over the phone as well.       Past Medical History:  Diagnosis Date   (HFpEF) heart failure with preserved ejection fraction ([name])      a. EF previously 30-35% in 02/2021 in the setting of afib w/RVR --> normalized by repeat imaging in 05/2021.   Anxiety     Atrial flutter ([name])      a. s/p DCCV in 02/2021   Atrial septal defect      Surgical repair in 1994   Cardiomyopathy [facility])     Chest pain      Normal coronary angiography in 1994 and 2011   Dizziness     Endometrial cancer, grade I ([name]) 10/09/2019   Essential thrombocythemia ([name]) 06/06/2016   GERD (gastroesophageal reflux disease)     History of kidney stones     Hyperlipidemia     Hypertension     LBBB (left bundle branch block) 2011   Mitral regurgitation     Palpitations     Persistent atrial fibrillation ([name])     PONV (postoperative nausea and vomiting)     Syncope     Tricuspid regurgitation     Type 2 diabetes mellitus with other specified complication ([name]) 07/18/2019   Ventricular tachycardia ([name])      Exercise induced             Past Surgical History:  Procedure Laterality Date   APPENDECTOMY       ASD REPAIR   1994    [facility]   BREAST EXCISIONAL BIOPSY        Right and left   BREAST SURGERY         right and left breast-benign   CARDIAC CATHETERIZATION   12/24/2009    Dr. Wonda    CARDIOVERSION N/A 03/04/2021    Procedure: CARDIOVERSION;  Surgeon: Alvan Carrier FALCON, MD;  Location: [facility] ORS;  Service: Endoscopy;  Laterality: N/A;   CATARACT EXTRACTION W/PHACO Left 03/11/2013    Procedure: CATARACT EXTRACTION PHACO AND INTRAOCULAR LENS PLACEMENT (IOC);  Surgeon: Oneil T. Roz, MD;  Location: [facility] ORS;  Service: Ophthalmology;  Laterality: Left;  CDE:  12.20   HYSTEROSCOPY WITH D & C N/A 11/11/2019    Procedure: DILATATION AND CURETTAGE /HYSTEROSCOPY;  Surgeon: Edsel Norleen GAILS, MD;  Location: [facility] ORS;  Service: Gynecology;  Laterality: N/A;   POLYPECTOMY N/A 11/11/2019    Procedure: POLYPECTOMY(ENDOMETRIAL POLYP);  Surgeon: Edsel Norleen GAILS, MD;  Location: [facility]S;  Service: Gynecology;  Laterality: N/A;   ROBOTIC ASSISTED LAPAROSCOPIC HYSTERECTOMY AND SALPINGECTOMY Bilateral 12/09/2019    Procedure: XI ROBOTIC ASSISTED LAPAROSCOPIC HYSTERECTOMY BILATERAL SALPINGOOOPHORECTOMY;  Surgeon: Eloy Herring, MD;  Location: [facility];  Service: Gynecology;  Laterality: Bilateral;   SENTINEL NODE BIOPSY N/A 12/09/2019  Procedure: SENTINEL LYMPH  NODE BIOPSY;  Surgeon: Eloy Herring, MD;  Location: [facility];  Service: Gynecology;  Laterality: N/A;   TEE WITHOUT CARDIOVERSION N/A 03/04/2021    Procedure: TRANSESOPHAGEAL ECHOCARDIOGRAM (TEE);  Surgeon: Alvan Dorn FALCON, MD;  Location: [facility];  Service: Endoscopy;  Laterality: N/A;   TONSILLECTOMY       TUBAL LIGATION        Bilateral        Inpatient Medications: Scheduled Meds:  apixaban   5 mg Oral BID   atorvastatin   40 mg Oral QHS   cycloSPORINE   1 drop Both Eyes BID   fenofibrate   160 mg Oral Daily   hydroxyurea   500 mg Oral Daily   insulin  aspart  0-15 Units Subcutaneous TID WC   insulin  aspart  0-5 Units Subcutaneous QHS   levothyroxine   75 mcg Oral Q0600   pantoprazole   40 mg Oral Daily   prednisoLONE  acetate  1 drop Both Eyes BID         Continuous Infusions:       PRN Meds: acetaminophen  **OR** acetaminophen , ondansetron  **OR** ondansetron  (ZOFRAN ) IV       Allergies:   [Allergies]  [Allergies]      Allergen Reactions   Codeine Nausea And Vomiting   Morphine  Other (See Comments)      GI symptoms     Social History:   Social History         Socioeconomic History   Marital status: Widowed      Spouse name: widowed   Number of children: 3   Years of education: Not on file   Highest education level: Not on file  Occupational History   Occupation: Production designer, theatre/television/film shop  Tobacco Use   Smoking status: Never   Smokeless tobacco: Never  Vaping Use   Vaping status: Never Used  Substance and Sexual Activity   Alcohol use: No   Drug use: No   Sexual activity: Not Currently      Birth control/protection: Post-menopausal, Surgical      Comment: hyst  Other Topics Concern   Not on file  Social History Narrative    Married and resides in [place]. 3 children. 3 grandchildren.    Sedentary lifestyle    Social Drivers of Health        Tobacco Use: Low Risk (10/24/2024)    Patient History     Smoking Tobacco Use: Never     Smokeless Tobacco Use: Never     Passive Exposure: Not on file  Financial Resource Strain: Low Risk (03/29/2023)    Overall Financial Resource Strain (CARDIA)     Difficulty of Paying Living Expenses: Not hard at all  Food Insecurity: No Food Insecurity (10/24/2024)    Epic     Worried About Radiation Protection Practitioner of Food in the Last Year: Never true     Ran Out of Food in the Last Year: Never true  Transportation Needs: No Transportation Needs (10/24/2024)    Epic     Lack of Transportation (Medical): No     Lack of Transportation (Non-Medical): No  Physical Activity: Insufficiently Active (03/29/2023)    Exercise Vital Sign     Days of Exercise per Week: 1 day     Minutes of Exercise per Session: 10 min  Stress: No Stress Concern Present (03/29/2023)    [employer] of Occupational  Health - Occupational Stress Questionnaire     Feeling of Stress : Not at all  Social Connections: Moderately Isolated (  10/24/2024)    Social Connection and Isolation Panel     Frequency of Communication with Friends and Family: Twice a week     Frequency of Social Gatherings with Friends and Family: Once a week     Attends Religious Services: 1 to 4 times per year     Active Member of [employer] or Organizations: No     Attends Banker Meetings: Never     Marital Status: Widowed  Intimate Partner Violence: Not At Risk (10/24/2024)    Epic     Fear of Current or Ex-Partner: No     Emotionally Abused: No     Physically Abused: No     Sexually Abused: No  Depression (PHQ2-9): Low Risk (10/09/2024)    Depression (PHQ2-9)     PHQ-2 Score: 0  Alcohol Screen: Low Risk (03/29/2023)    Alcohol Screen     Last Alcohol Screening Score (AUDIT): 0  Housing: Low Risk (10/24/2024)    Epic     Unable to Pay for Housing in the Last Year: No     Number of Times Moved in the Last Year: 0     Homeless in the Last Year: No  Utilities: Not At Risk (10/24/2024)    Epic     Threatened with loss of utilities: No  Health Literacy: Not on file    Family History:          Family History  Problem Relation Age of Onset   Cancer Mother          female   Heart failure Father     Stroke Sister     Cancer Sister          breast   Coronary artery disease Neg Hx          ROS:  Review of Systems: [y] = yes, [ ]  = no      General: Weight gain [ ] ; Weight loss [ ] ; Anorexia [ ] ; Fatigue [ ] ; Fever [ ] ; Chills [ ] ; Weakness [ ]    Cardiac: Chest pain/pressure [ ] ; Resting SOB [ ] ; Exertional SOB [ ] ; Orthopnea [ ] ; Pedal Edema [ ] ; Palpitations [ ] ; Syncope [ ] ; Presyncope [ ] ; Paroxysmal nocturnal dyspnea [ ]    Pulmonary: Cough [ ] ; Wheezing [ ] ; Hemoptysis [ ] ; Sputum [ ] ; Snoring [ ]    GI: Vomiting [ ] ; Dysphagia [ ] ; Melena [ ] ; Hematochezia [ ] ; Heartburn [ ] ; Abdominal pain [ ] ;  Constipation [ ] ; Diarrhea [ ] ; BRBPR [ ]    GU: Hematuria [ ] ; Dysuria [ ] ; Nocturia [ ]  Vascular: Pain in legs with walking [ ] ; Pain in feet with lying flat [ ] ; Non-healing sores [ ] ; Stroke [ ] ; TIA [ ] ; Slurred speech [ ] ;   Neuro: Headaches [ ] ; Vertigo [ ] ; Seizures [ ] ; Paresthesias [ ] ;Blurred vision [ ] ; Diplopia [ ] ; Vision changes [ ]    Ortho/Skin: Arthritis [ ] ; Joint pain [ ] ; Muscle pain [ ] ; Joint swelling [ ] ; Back Pain [ ] ; Rash [ ]    Psych: Depression [ ] ; Anxiety [ ]    Heme: Bleeding problems [ ] ; Clotting disorders [ ] ; Anemia [ ]    Endocrine: Diabetes [ ] ; Thyroid  dysfunction [ ]     Physical Exam/Data:          Vitals:    10/24/24 1926 10/24/24 2323 10/25/24 0323 10/25/24 0810  BP: (!) 147/70 (!) 139/54 (!) 165/63 132/74  Pulse: (!) 59 62 64 74  Resp: 17 18 14 15   Temp: 98.5 F (36.9 C) 98 F (36.7 C) 97.7 F (36.5 C) 98 F (36.7 C)  TempSrc: Oral Oral Oral Oral  SpO2: 96% 99% 96% 96%  Weight:          Height:              Intake/Output Summary (Last 24 hours) at 10/25/2024 1012 Last data filed at 10/24/2024 1900    Gross per 24 hour  Intake 340 ml  Output 1300 ml  Net -960 ml        10/24/2024   12:52 AM 10/09/2024    2:41 PM 09/01/2024   11:14 AM  Last 3 Weights  Weight (lbs) 164 lb 7.4 oz 169 lb 163 lb 3.2 oz  Weight (kg) 74.6 kg 76.658 kg 74.027 kg     Body mass index is 28.23 kg/m.  General:  Well nourished, well developed, in no acute distress HEENT: normal Vascular: No carotid bruits; FA pulses 2+ bilaterally without bruits  Cardiac:  normal S1, S2; RRR; 3/6 RUSB systolic murmur  Lungs:  clear to auscultation bilaterally, no wheezing, rhonchi or rales  Abd: soft, nontender, no hepatomegaly  Ext: no edema Musculoskeletal:  No deformities, BUE and BLE strength normal and equal Skin: warm and dry  Neuro:  CNs 2-12 intact, no focal abnormalities noted Psych:  Normal affect    EKG:  The EKG was personally reviewed and demonstrates:  10/23/24 with SB 54, PR 360, QRS 142, QT/c 460/436, frequent PACs, LBBB/IVCD   Telemetry: Telemetry was personally reviewed and demonstrates: SB->NSR with 1' AVB 60-70s    Relevant CV Studies:  TTE  Result date: 10/24/24  1. Left ventricular ejection fraction, by estimation, is 55 to 60%. The  left ventricle has normal function. The left ventricle has no regional  wall motion abnormalities. There is mild left ventricular hypertrophy.  Left ventricular diastolic parameters  are indeterminate.   2. Right ventricular systolic function is mildly reduced. The right  ventricular size is mildly enlarged. There is mildly elevated pulmonary  artery systolic pressure. The estimated right ventricular systolic  pressure is 37.4 mmHg.   3. Left atrial size was mildly dilated.   4. Right atrial size was mildly dilated.   5. The mitral valve is degenerative. Moderate mitral valve regurgitation.  No evidence of mitral stenosis.   6. Tricuspid valve regurgitation is moderate to severe.   7. The aortic valve is tricuspid. There is mild calcification of the  aortic valve. Aortic valve regurgitation is not visualized. Aortic valve  sclerosis/calcification is present, without any evidence of aortic  stenosis.   8. The inferior vena cava is dilated in size with >50% respiratory  variability, suggesting right atrial pressure of 8 mmHg.    Laboratory Data:   Chemistry Last Labs       Recent Labs  Lab 10/23/24 1758 10/24/24 0222 10/25/24 0232  NA 141 142 143  K 3.3* 3.4* 4.7  CL 103 105 107  CO2 24 24 30   GLUCOSE 151* 121* 126*  BUN 20 17 15   CREATININE 1.21* 0.99 0.98  CALCIUM  9.5 9.2 8.7*  GFRNONAA 46* 58* 59*  ANIONGAP 14 12 6       Last Labs     Recent Labs  Lab 10/25/24 0232  PROT 6.3*  ALBUMIN 4.0  AST 19  ALT 15  ALKPHOS 53  BILITOT 0.4      Hematology Last Labs  Recent Labs  Lab 10/23/24 1758 10/24/24 0222 10/25/24 0232  WBC 9.1 6.1 5.6  RBC 3.24* 3.28*  3.24*  HGB 11.5* 11.6* 11.5*  HCT 34.2* 34.1* 34.0*  MCV 105.6* 104.0* 104.9*  MCH 35.5* 35.4* 35.5*  MCHC 33.6 34.0 33.8  RDW 12.9 13.0 12.7  PLT 389 345 346      BNP Last Labs     Recent Labs  Lab 10/23/24 1758  PROBNP 768.0*      Radiology/Studies:  ECHOCARDIOGRAM COMPLETE Result Date: 10/24/2024    ECHOCARDIOGRAM REPORT   Patient Name:   Heather Barber Date of Exam: 10/24/2024 Medical Rec #:  [id number]      Height:       64.0 in Accession #:    [id number]     Weight:       164.5 lb Date of Birth:  08-Oct-1946      BSA:          1.800 m Patient Age:    78 years       BP:           139/48 mmHg Patient Gender: F              HR:           72 bpm. Exam Location:  Inpatient Procedure: 2D Echo, Cardiac Doppler and Color Doppler (Both Spectral and Color            Flow Doppler were utilized during procedure). Indications:    Dyspnea R06.00  History:        Patient has prior history of Echocardiogram examinations, most                 recent 06/26/2023. Arrythmias:AV Block, Signs/Symptoms:Dyspnea;                 Risk Factors:Hypertension, Dyslipidemia and Diabetes.  Sonographer:    Koleen Popper RDCS Referring Phys: HUSAM M SALAH IMPRESSIONS  1. Left ventricular ejection fraction, by estimation, is 55 to 60%. The left ventricle has normal function. The left ventricle has no regional wall motion abnormalities. There is mild left ventricular hypertrophy. Left ventricular diastolic parameters are indeterminate.  2. Right ventricular systolic function is mildly reduced. The right ventricular size is mildly enlarged. There is mildly elevated pulmonary artery systolic pressure. The estimated right ventricular systolic pressure is 37.4 mmHg.  3. Left atrial size was mildly dilated.  4. Right atrial size was mildly dilated.  5. The mitral valve is degenerative. Moderate mitral valve regurgitation. No evidence of mitral stenosis.  6. Tricuspid valve regurgitation is moderate to severe.  7. The aortic valve  is tricuspid. There is mild calcification of the aortic valve. Aortic valve regurgitation is not visualized. Aortic valve sclerosis/calcification is present, without any evidence of aortic stenosis.  8. The inferior vena cava is dilated in size with >50% respiratory variability, suggesting right atrial pressure of 8 mmHg. FINDINGS  Left Ventricle: Left ventricular ejection fraction, by estimation, is 55 to 60%. The left ventricle has normal function. The left ventricle has no regional wall motion abnormalities. The left ventricular internal cavity size was normal in size. There is  mild left ventricular hypertrophy. Left ventricular diastolic parameters are indeterminate. Right Ventricle: The right ventricular size is mildly enlarged. No increase in right ventricular wall thickness. Right ventricular systolic function is mildly reduced. There is mildly elevated pulmonary artery systolic pressure. The tricuspid regurgitant  velocity is 2.71 m/s, and with an assumed right atrial pressure of  8 mmHg, the estimated right ventricular systolic pressure is 37.4 mmHg. Left Atrium: Left atrial size was mildly dilated. Right Atrium: Right atrial size was mildly dilated. Pericardium: Trivial pericardial effusion is present. Mitral Valve: The mitral valve is degenerative in appearance. Mild to moderate mitral annular calcification. Moderate mitral valve regurgitation. No evidence of mitral valve stenosis. Tricuspid Valve: The tricuspid valve is grossly normal. Tricuspid valve regurgitation is moderate to severe. No evidence of tricuspid stenosis. Aortic Valve: The aortic valve is tricuspid. There is mild calcification of the aortic valve. Aortic valve regurgitation is not visualized. Aortic valve sclerosis/calcification is present, without any evidence of aortic stenosis. Aortic valve mean gradient measures 6.1 mmHg. Aortic valve peak gradient measures 12.6 mmHg. Aortic valve area, by VTI measures 2.83 cm. Pulmonic Valve: The  pulmonic valve was normal in structure. Pulmonic valve regurgitation is trivial. No evidence of pulmonic stenosis. Aorta: The aortic root is normal in size and structure. Venous: The inferior vena cava is dilated in size with greater than 50% respiratory variability, suggesting right atrial pressure of 8 mmHg. IAS/Shunts: No atrial level shunt detected by color flow Doppler.  LEFT VENTRICLE PLAX 2D LVIDd:         4.90 cm LVIDs:         3.40 cm LV PW:         1.10 cm LV IVS:        1.10 cm LVOT diam:     2.10 cm LV SV:         102 LV SV Index:   57 LVOT Area:     3.46 cm  LV Volumes (MOD) LV vol d, MOD A2C: 143.0 ml LV vol d, MOD A4C: 143.0 ml LV vol s, MOD A2C: 58.9 ml LV vol s, MOD A4C: 49.6 ml LV SV MOD A2C:     84.1 ml LV SV MOD A4C:     143.0 ml LV SV MOD BP:      85.4 ml RIGHT VENTRICLE            IVC RV Basal diam:  4.50 cm    IVC diam: 2.50 cm RV Mid diam:    3.70 cm RV S prime:     8.78 cm/s TAPSE (M-mode): 1.8 cm LEFT ATRIUM             Index        RIGHT ATRIUM           Index LA diam:        5.20 cm 2.89 cm/m   RA Area:     19.60 cm LA Vol (A2C):   52.9 ml 29.38 ml/m  RA Volume:   53.30 ml  29.61 ml/m LA Vol (A4C):   58.2 ml 32.33 ml/m LA Biplane Vol: 57.4 ml 31.88 ml/m  AORTIC VALVE AV Area (Vmax):    2.73 cm AV Area (Vmean):   2.90 cm AV Area (VTI):     2.83 cm AV Vmax:           177.61 cm/s AV Vmean:          116.203 cm/s AV VTI:            0.360 m AV Peak Grad:      12.6 mmHg AV Mean Grad:      6.1 mmHg LVOT Vmax:         140.00 cm/s LVOT Vmean:        97.300 cm/s LVOT VTI:  0.294 m LVOT/AV VTI ratio: 0.82  AORTA Ao Root diam: 2.80 cm Ao Asc diam:  3.20 cm MITRAL VALVE                 TRICUSPID VALVE MV Area VTI: 0.56 cm        TR Peak grad:   29.4 mmHg MV VTI:      1.81 m          TR Vmax:        271.00 cm/s MR Peak grad:   90.6 mmHg MR Vmax:        476.00 cm/s  SHUNTS MR PISA:        1.01 cm     Systemic VTI:  0.29 m MR PISA Radius: 0.40 cm      Systemic Diam: 2.10 cm Soyla Merck MD Electronically signed by Soyla Merck MD Signature Date/Time: 10/24/2024/2:41:58 PM    Final     DG Chest Portable 1 View Result Date: 10/23/2024 EXAM: 1 VIEW(S) XRAY OF THE CHEST 10/23/2024 06:38:43 PM COMPARISON: 11/26/2023 CLINICAL HISTORY: sob FINDINGS: LUNGS AND PLEURA: No focal pulmonary opacity. Basilar scarring. No pleural effusion. No pneumothorax. HEART AND MEDIASTINUM: Sternotomy wires noted. Cardiomegaly, stable. CABG changes. Aortic atherosclerosis. BONES AND SOFT TISSUES: No acute osseous abnormality. IMPRESSION: 1. No acute cardiopulmonary abnormality. Electronically signed by: Norman Gatlin MD 10/23/2024 06:47 PM EST RP Workstation: [id number]    Assessment and Plan:  Heather Barber is a 79 y.o. female with persistent AF/AFL, 1' AVB, LBBB, HFrecEF, hypothyroidism, HLD, HTN who is evaluated for sx AF/SVR and sx bradycardia.    AF/SVR Sx bradycardia 1' AVB Mobitz I 2' AVB She remains in NSR in the 60s today and is completely asymptomatic now.  I spoke with her family on the phone and they feel like she is much better today than she has been in the past.  I reviewed the telemetry which shows low amplitude P waves and sinus bradycardia with significant 1' AVB (PR 360 ms) and intermittent Mobitz I, 2' AVB.  We discussed different options for management of her atrial arrhythmias.  At this point I do not think she can tolerate most antiarrhythmic medications and with her prior presumed tachycardia induced cardiomyopathy I do think that rhythm control was reasonable.  She fortunately has preserved LVEF and only mild left atrial dilation so I think the chance of keeping her in normal rhythm with persistent AF is high.  She understands that ablation is not curative.  We discussed that in the future she still may end up with a PPM for symptomatic bradycardia if her AF proves to be more difficult to control as we are limited for both rate and rhythm control options with her baseline  marked 1' AVB.  For now she would like to proceed with scheduling ablation (PVI+CTI tentatively).  She has severe TR on most recent TTE but is otherwise asymptomatic from this.  I should be able to schedule her in the next 2 weeks for ablation.  For right now would leave on uninterrupted apixaban  and discharge without any rate or rhythm control medications.  Stable for discharge from a cardiology standpoint.  Will coordinate outpatient follow-up and scheduling for ablation.   Discussed treatment options today for AF including antiarrhythmic drug therapy and ablation. Discussed risks, recovery and likelihood of success with each treatment strategy. Risk, benefits, and alternatives to EP study and ablation for afib were discussed. These risks include but are not limited to stroke, bleeding,  vascular damage, tamponade, perforation, damage to the esophagus, lungs, phrenic nerve and other structures, worsening renal function, coronary vasospasm and death.  Discussed potential need for repeat ablation procedures and antiarrhythmic drugs after an initial ablation. The patient understands these risk and wishes to proceed.  We will therefore proceed with catheter ablation. Carto, ICE, anesthesia are requested for the procedure.    Pre-procedure details: Carto/ICE/GA-anesthesia, no CT scan pre procedure  Hold apixaban  and all medications the morning of the procedure Take apixaban  and all medications the night prior     "

## 2024-11-06 NOTE — Transfer of Care (Signed)
 Immediate Anesthesia Transfer of Care Note  Patient: Heather Barber  Procedure(s) Performed: ATRIAL FIBRILLATION ABLATION  Patient Location: PACU and Cath Lab  Anesthesia Type:General  Level of Consciousness: awake, alert , patient cooperative, and responds to stimulation  Airway & Oxygen Therapy: Patient Spontanous Breathing and Patient connected to face mask oxygen  Post-op Assessment: Report given to RN and Post -op Vital signs reviewed and stable  Post vital signs: Reviewed and stable  Last Vitals:  Vitals Value Taken Time  BP 105/47   Temp    Pulse 100   Resp    SpO2 92     Last Pain:  Vitals:   11/06/24 0608  TempSrc: Oral  PainSc: 0-No pain         Complications: No notable events documented.

## 2024-11-06 NOTE — Anesthesia Postprocedure Evaluation (Signed)
"   Anesthesia Post Note  Patient: Heather Barber  Procedure(s) Performed: ATRIAL FIBRILLATION ABLATION A-FLUTTER ABLATION ATRIAL TACH ABLATION     Patient location during evaluation: PACU Anesthesia Type: General Level of consciousness: awake and alert Pain management: pain level controlled Vital Signs Assessment: post-procedure vital signs reviewed and stable Respiratory status: spontaneous breathing, nonlabored ventilation, respiratory function stable and patient connected to nasal cannula oxygen Cardiovascular status: blood pressure returned to baseline and stable Postop Assessment: no apparent nausea or vomiting Anesthetic complications: no   No notable events documented.  Last Vitals:  Vitals:   11/06/24 1345 11/06/24 1350  BP: 121/65 106/63  Pulse: (!) 108 (!) 103  Resp: (!) 23 19  Temp:    SpO2: 93% 93%    Last Pain:  Vitals:   11/06/24 1221  TempSrc: Oral  PainSc: 0-No pain                 Cordella SQUIBB Lisa-Marie Rueger      "

## 2024-11-07 ENCOUNTER — Encounter (HOSPITAL_COMMUNITY): Payer: Self-pay | Admitting: Student in an Organized Health Care Education/Training Program

## 2024-11-07 DIAGNOSIS — E119 Type 2 diabetes mellitus without complications: Secondary | ICD-10-CM | POA: Diagnosis not present

## 2024-11-07 DIAGNOSIS — I4819 Other persistent atrial fibrillation: Principal | ICD-10-CM

## 2024-11-07 DIAGNOSIS — Z79899 Other long term (current) drug therapy: Secondary | ICD-10-CM | POA: Diagnosis not present

## 2024-11-07 DIAGNOSIS — E785 Hyperlipidemia, unspecified: Secondary | ICD-10-CM | POA: Diagnosis not present

## 2024-11-07 DIAGNOSIS — I110 Hypertensive heart disease with heart failure: Secondary | ICD-10-CM | POA: Diagnosis not present

## 2024-11-07 DIAGNOSIS — I447 Left bundle-branch block, unspecified: Secondary | ICD-10-CM | POA: Diagnosis not present

## 2024-11-07 DIAGNOSIS — E039 Hypothyroidism, unspecified: Secondary | ICD-10-CM | POA: Diagnosis not present

## 2024-11-07 DIAGNOSIS — I5032 Chronic diastolic (congestive) heart failure: Secondary | ICD-10-CM | POA: Diagnosis not present

## 2024-11-07 DIAGNOSIS — Z7984 Long term (current) use of oral hypoglycemic drugs: Secondary | ICD-10-CM | POA: Diagnosis not present

## 2024-11-07 DIAGNOSIS — I5030 Unspecified diastolic (congestive) heart failure: Secondary | ICD-10-CM | POA: Diagnosis not present

## 2024-11-07 DIAGNOSIS — I484 Atypical atrial flutter: Secondary | ICD-10-CM | POA: Diagnosis not present

## 2024-11-07 MED FILL — Atropine Sulfate Soln Prefill Syr 1 MG/10ML (0.1 MG/ML): INTRAMUSCULAR | Qty: 10 | Status: AC

## 2024-11-07 MED FILL — Fentanyl Citrate Preservative Free (PF) Inj 100 MCG/2ML: INTRAMUSCULAR | Qty: 2 | Status: AC

## 2024-11-07 NOTE — TOC CM/SW Note (Signed)
 Transition of Care [facility]) - Inpatient Brief Assessment   Patient Details  Name: Heather Barber MRN: [id number] Date of Birth: 07/30/1946  [facility]) CM/SW Contact:    Sudie Erminio Deems, RN Phone Number: 11/07/2024, 11:06 AM   Clinical Narrative: Patient post ablation. PTA patient was independent from home. Patient states she still works 12 hour shifts and drives. Patient has PCP and insurance. No home needs identified at this time. Daughter is at the bedside and will transport home via private vehicle.    Transition of Care Asessment: Insurance and Status: Insurance coverage has been reviewed Patient has primary care physician: Yes Home environment has been reviewed: reviewed Prior level of function:: independent works 12 hour shifts. Prior/Current Home Services: No current home services Social Drivers of Health Review: SDOH reviewed no interventions necessary Readmission risk has been reviewed: Yes Transition of care needs: no transition of care needs at this time

## 2024-11-07 NOTE — Discharge Instructions (Signed)

## 2024-11-23 ENCOUNTER — Other Ambulatory Visit: Payer: Self-pay | Admitting: Cardiology

## 2024-11-23 NOTE — Progress Notes (Unsigned)
 "   Impression/Assessment:  Microscopic hematuria in the past, noted today with negative CT, cystoscopy, cytology  History of UTI.  Urinalysis today not convincing for this  Plan:  I will set up urine for culture.  No antibiotics sent in yet  Follow-up dependent upon results of above  She was also given a prescription for Urispas History of Present Illness:   3.4.2025: Here for urgent work in.  At last visit she had a negative cystoscopy and negative cytology as part of microscopic hematuria evaluation.  Hematuria CT was negative for GU issues.  She has had recurrent urinary tract infections.  After last visit she was placed on cephalexin , she is now on methenamine  hippurate suppression.  Here because of frequency and dysuria, feeling bad.  She states that she has had some gross hematuria.  1.27.2026:  Past Medical History:  Diagnosis Date   (HFpEF) heart failure with preserved ejection fraction ([name])    a. EF previously 30-35% in 02/2021 in the setting of afib w/RVR --> normalized by repeat imaging in 05/2021.   Anxiety    Atrial flutter ([name])    a. s/p DCCV in 02/2021   Atrial septal defect    Surgical repair in 1994   Cardiomyopathy [facility])    Chest pain    Normal coronary angiography in 1994 and 2011   Dizziness    Endometrial cancer, grade I ([name]) 10/09/2019   Essential thrombocythemia ([name]) 06/06/2016   GERD (gastroesophageal reflux disease)    History of kidney stones    Hyperlipidemia    Hypertension    LBBB (left bundle branch block) 2011   Mitral regurgitation    Palpitations    Persistent atrial fibrillation ([name])    PONV (postoperative nausea and vomiting)    Syncope    Tricuspid regurgitation    Type 2 diabetes mellitus with other specified complication ([name]) 07/18/2019   Ventricular tachycardia ([name])    Exercise induced    Past Surgical History:  Procedure Laterality Date   A-FLUTTER ABLATION N/A 11/06/2024   Procedure: A-FLUTTER ABLATION;  Surgeon:  Almetta Donnice LABOR, MD;  Location: [facility] INVASIVE CV LAB;  Service: Cardiovascular;  Laterality: N/A;   APPENDECTOMY     ASD REPAIR  1994   [facility]   ATRIAL FIBRILLATION ABLATION N/A 11/06/2024   Procedure: ATRIAL FIBRILLATION ABLATION;  Surgeon: Almetta Donnice LABOR, MD;  Location: [facility] INVASIVE CV LAB;  Service: Cardiovascular;  Laterality: N/A;   ATRIAL TACH ABLATION N/A 11/06/2024   Procedure: ATRIAL TACH ABLATION;  Surgeon: Almetta Donnice LABOR, MD;  Location: [facility] INVASIVE CV LAB;  Service: Cardiovascular;  Laterality: N/A;   BREAST EXCISIONAL BIOPSY     Right and left   BREAST SURGERY     right and left breast-benign   CARDIAC CATHETERIZATION  12/24/2009   Dr. Wonda    CARDIOVERSION N/A 03/04/2021   Procedure: CARDIOVERSION;  Surgeon: Alvan Dorn FALCON, MD;  Location: [facility] ORS;  Service: Endoscopy;  Laterality: N/A;   CATARACT EXTRACTION W/PHACO Left 03/11/2013   Procedure: CATARACT EXTRACTION PHACO AND INTRAOCULAR LENS PLACEMENT (IOC);  Surgeon: Oneil T. Roz, MD;  Location: [facility] ORS;  Service: Ophthalmology;  Laterality: Left;  CDE:  12.20   HYSTEROSCOPY WITH D & C N/A 11/11/2019   Procedure: DILATATION AND CURETTAGE /HYSTEROSCOPY;  Surgeon: Edsel Norleen GAILS, MD;  Location: [facility] ORS;  Service: Gynecology;  Laterality: N/A;   POLYPECTOMY N/A 11/11/2019   Procedure: POLYPECTOMY(ENDOMETRIAL POLYP);  Surgeon: Edsel Norleen GAILS, MD;  Location: [facility]  ORS;  Service: Gynecology;  Laterality: N/A;   ROBOTIC ASSISTED LAPAROSCOPIC HYSTERECTOMY AND SALPINGECTOMY Bilateral 12/09/2019   Procedure: XI ROBOTIC ASSISTED LAPAROSCOPIC HYSTERECTOMY BILATERAL SALPINGOOOPHORECTOMY;  Surgeon: Eloy Herring, MD;  Location: [facility] ORS;  Service: Gynecology;  Laterality: Bilateral;   SENTINEL NODE BIOPSY N/A 12/09/2019   Procedure: SENTINEL LYMPH  NODE BIOPSY;  Surgeon: Eloy Herring, MD;  Location: [facility];  Service: Gynecology;  Laterality: N/A;   TEE WITHOUT CARDIOVERSION N/A 03/04/2021   Procedure: TRANSESOPHAGEAL  ECHOCARDIOGRAM (TEE);  Surgeon: Alvan Dorn FALCON, MD;  Location: [facility];  Service: Endoscopy;  Laterality: N/A;   TONSILLECTOMY     TUBAL LIGATION     Bilateral    Home Medications:  (Not in a hospital admission)   Allergies:  Allergies  Allergen Reactions   Codeine Nausea And Vomiting   Morphine  Other (See Comments)    GI symptoms    Family History  Problem Relation Age of Onset   Cancer Mother        female   Heart failure Father    Stroke Sister    Cancer Sister        breast   Coronary artery disease Neg Hx     Social History:  reports that she has never smoked. She has never used smokeless tobacco. She reports that she does not drink alcohol and does not use drugs.  ROS: A complete review of systems was performed.  All systems are negative except for pertinent findings as noted.  Physical Exam:  Vital signs in last 24 hours: @VSRANGES @ General:  Alert and oriented, No acute distress HEENT: Normocephalic, atraumatic Neck: No JVD or lymphadenopathy Cardiovascular: Regular rate  Lungs: Normal inspiratory/expiratory excursion Extremities: No edema Neurologic: Grossly intact  I have reviewed prior pt notes  I have reviewed prior urinalysis and urine culture results  I have independently reviewed prior imaging--CT results  I have reviewed cytology results      "

## 2024-11-25 ENCOUNTER — Other Ambulatory Visit

## 2024-11-25 ENCOUNTER — Ambulatory Visit: Payer: Medicare Other | Admitting: Urology

## 2024-11-25 DIAGNOSIS — N3000 Acute cystitis without hematuria: Secondary | ICD-10-CM

## 2024-11-25 DIAGNOSIS — R300 Dysuria: Secondary | ICD-10-CM

## 2024-11-25 DIAGNOSIS — E538 Deficiency of other specified B group vitamins: Secondary | ICD-10-CM

## 2024-11-25 DIAGNOSIS — Z1589 Genetic susceptibility to other disease: Secondary | ICD-10-CM

## 2024-11-25 DIAGNOSIS — D473 Essential (hemorrhagic) thrombocythemia: Secondary | ICD-10-CM

## 2024-11-25 DIAGNOSIS — N952 Postmenopausal atrophic vaginitis: Secondary | ICD-10-CM

## 2024-11-25 DIAGNOSIS — R3129 Other microscopic hematuria: Secondary | ICD-10-CM

## 2024-11-25 DIAGNOSIS — E611 Iron deficiency: Secondary | ICD-10-CM

## 2024-11-25 LAB — COMPREHENSIVE METABOLIC PANEL WITH GFR
ALT: 9 [IU]/L (ref 0–32)
AST: 12 [IU]/L (ref 0–40)
Albumin: 4.5 g/dL (ref 3.8–4.8)
Alkaline Phosphatase: 73 [IU]/L (ref 49–135)
BUN/Creatinine Ratio: 16 (ref 12–28)
BUN: 14 mg/dL (ref 8–27)
Bilirubin Total: 0.3 mg/dL (ref 0.0–1.2)
CO2: 18 mmol/L — ABNORMAL LOW (ref 20–29)
Calcium: 9.2 mg/dL (ref 8.7–10.3)
Chloride: 103 mmol/L (ref 96–106)
Creatinine, Ser: 0.87 mg/dL (ref 0.57–1.00)
Globulin, Total: 2 g/dL (ref 1.5–4.5)
Glucose: 156 mg/dL — ABNORMAL HIGH (ref 70–99)
Potassium: 4.1 mmol/L (ref 3.5–5.2)
Sodium: 140 mmol/L (ref 134–144)
Total Protein: 6.5 g/dL (ref 6.0–8.5)
eGFR: 68 mL/min/{1.73_m2}

## 2024-11-25 LAB — IRON AND TIBC
Iron Saturation: 10 % — ABNORMAL LOW (ref 15–55)
Iron: 48 ug/dL (ref 27–139)
Total Iron Binding Capacity: 461 ug/dL — ABNORMAL HIGH (ref 250–450)
UIBC: 413 ug/dL — ABNORMAL HIGH (ref 118–369)

## 2024-11-25 LAB — FERRITIN: Ferritin: 28 ng/mL (ref 15–150)

## 2024-11-27 LAB — METHYLMALONIC ACID, SERUM: Methylmalonic Acid: 101 nmol/L (ref 0–378)

## 2024-11-27 LAB — LACTATE DEHYDROGENASE: LDH: 220 [IU]/L (ref 119–226)

## 2024-11-30 ENCOUNTER — Other Ambulatory Visit: Payer: Self-pay | Admitting: Family Medicine

## 2024-11-30 DIAGNOSIS — K219 Gastro-esophageal reflux disease without esophagitis: Secondary | ICD-10-CM

## 2024-12-02 ENCOUNTER — Inpatient Hospital Stay: Admitting: Physician Assistant

## 2024-12-02 ENCOUNTER — Inpatient Hospital Stay

## 2024-12-02 ENCOUNTER — Other Ambulatory Visit: Payer: Self-pay | Admitting: Physician Assistant

## 2024-12-02 ENCOUNTER — Encounter: Payer: Self-pay | Admitting: Physician Assistant

## 2024-12-02 VITALS — BP 137/89 | HR 124 | Temp 100.1°F | Resp 19 | Ht 64.0 in | Wt 162.0 lb

## 2024-12-02 DIAGNOSIS — Z1589 Genetic susceptibility to other disease: Secondary | ICD-10-CM

## 2024-12-02 DIAGNOSIS — D473 Essential (hemorrhagic) thrombocythemia: Secondary | ICD-10-CM | POA: Diagnosis not present

## 2024-12-02 DIAGNOSIS — E611 Iron deficiency: Secondary | ICD-10-CM | POA: Diagnosis not present

## 2024-12-02 DIAGNOSIS — E538 Deficiency of other specified B group vitamins: Secondary | ICD-10-CM

## 2024-12-02 LAB — CBC
HCT: 34.8 % — ABNORMAL LOW (ref 36.0–46.0)
Hemoglobin: 11.2 g/dL — ABNORMAL LOW (ref 12.0–15.0)
MCH: 34.5 pg — ABNORMAL HIGH (ref 26.0–34.0)
MCHC: 32.2 g/dL (ref 30.0–36.0)
MCV: 107.1 fL — ABNORMAL HIGH (ref 80.0–100.0)
Platelets: 330 10*3/uL (ref 150–400)
RBC: 3.25 MIL/uL — ABNORMAL LOW (ref 3.87–5.11)
RDW: 12.9 % (ref 11.5–15.5)
WBC: 8.8 10*3/uL (ref 4.0–10.5)
nRBC: 0 % (ref 0.0–0.2)

## 2024-12-02 MED ORDER — HYDROXYUREA 500 MG PO CAPS: 500.0000 mg | ORAL_CAPSULE | Freq: Every day | ORAL | 3 refills | Status: AC

## 2024-12-02 NOTE — Patient Instructions (Signed)
 [facility] at [facility] Discharge Instructions  You were seen today by Pleasant Barefoot PA-C for your elevated platelets and mild anemia.  ESSENTIAL THROMBOCYTOSIS (elevated platelets): - Your platelet levels look good. - Continue hydroxyurea  (Hydrea ) to 500 mg (1 tablet) once per day.   - We will recheck your levels again in 4 months.  MILD ANEMIA: -Your blood count (hemoglobin) is slightly low. - Your vitamin B12 levels look good.  Continue to take vitamin B12 (cyanocobalamin ) 1000 mcg (1 mg) daily. - Your iron levels are low. CONTINUE taking ferrous sulfate  (iron supplement) 325 mg daily.  We will also schedule you for 1 dose of IV iron.  Please see the attached handout for important information regarding potential side effects of IV iron.  FOLLOW-UP APPOINTMENT: Office visit in 4 months with same-day labs   ** Thank you for trusting me with your healthcare!  I strive to provide all of my patients with quality care at each visit.  If you receive a survey for this visit, I would be so grateful to you for taking the time to provide feedback.  Thank you in advance!  ~ Alayssa Flinchum                                        Dr. Mickiel Davonna Pleasant Barefoot, PA-C          Delon Hope, NP   - - - - - - - - - - - - - - - - - -     Thank you for choosing [facility] at [facility] to provide your oncology and hematology care.  To afford each patient quality time with our provider, please arrive at least 15 minutes before your scheduled appointment time.   If you have a lab appointment with the Cancer Center please come in thru the Main Entrance and check in at the main information desk.  You need to re-schedule your appointment should you arrive 10 or more minutes late.  We strive to give you quality time with our providers, and arriving late affects you and other patients whose appointments are after yours.  Also, if you no show three  or more times for appointments you may be dismissed from the clinic at the providers discretion.     Again, thank you for choosing [facility].  Our hope is that these requests will decrease the amount of time that you wait before being seen by our physicians.       _____________________________________________________________  Should you have questions after your visit to [facility], please contact our office at [phone] and follow the prompts.  Our office hours are 8:00 a.m. and 4:30 p.m. Monday - Friday.  Please note that voicemails left after 4:00 p.m. may not be returned until the following business day.  We are closed weekends and major holidays.  You do have access to a nurse 24-7, just call the main number to the clinic [phone] and do not press any options, hold on the line and a nurse will answer the phone.    For prescription refill requests, have your pharmacy contact our office and allow 72 hours.    Due to Covid, you will need to wear a mask upon entering the hospital. If you do not have a mask,  a mask will be given to you at the Main Entrance upon arrival. For doctor visits, patients may have 1 support person age 78 or older with them. For treatment visits, patients can not have anyone with them due to social distancing guidelines and our immunocompromised population.

## 2024-12-04 ENCOUNTER — Ambulatory Visit (HOSPITAL_COMMUNITY): Admitting: Nurse Practitioner

## 2024-12-04 ENCOUNTER — Encounter (HOSPITAL_COMMUNITY): Payer: Self-pay | Admitting: Nurse Practitioner

## 2024-12-04 VITALS — BP 118/90 | HR 118 | Ht 64.0 in | Wt 164.0 lb

## 2024-12-04 DIAGNOSIS — D509 Iron deficiency anemia, unspecified: Secondary | ICD-10-CM

## 2024-12-04 DIAGNOSIS — I480 Paroxysmal atrial fibrillation: Secondary | ICD-10-CM

## 2024-12-04 DIAGNOSIS — D6859 Other primary thrombophilia: Secondary | ICD-10-CM | POA: Diagnosis not present

## 2024-12-04 DIAGNOSIS — I10 Essential (primary) hypertension: Secondary | ICD-10-CM

## 2024-12-04 DIAGNOSIS — I4819 Other persistent atrial fibrillation: Secondary | ICD-10-CM | POA: Diagnosis not present

## 2024-12-04 NOTE — Progress Notes (Signed)
 "  Primary Care Physician: Dettinger, Fonda LABOR, MD Primary Cardiologist: Alvan Carrier, MD Electrophysiologist: Almetta Donnice LABOR, MD   Referring Physician: Almetta Donnice LABOR, MD   Heather Barber is a 79 y.o. female with a history of persistent AF/AFL s/p ablation 11/06/2024,atrial tachycardia, Mobitz type I AVB, HFrecEF, LBBB, hypothyroidism, HLD, HTN,MR/TR (05/2023: mild MR, mod TR)  who presents for follow up in the [facility].  The patient was initially diagnosed with AFL/AF in 02/2021 and underwent TEE DCCV with early return of arrhythmia.  She was started on amiodarone  and converted to sinus rhythm with dose reduction needed due to elevated TSH in 2023.  She presented to the ED on 10/23/2024 with complaint of lightheadedness.  EKG was completed showing patient was in AF with SVR/bradycardia. She was noted to have heart rate in the 50s to 60s and amiodarone  and metoprolol  were both held. She was seen in follow-up by Dr. Almetta on 10/25/2024 and was deemed a candidate for ablation. She was discharged with metoprolol  and amiodarone  both held and underwent ablation procedure on 11/06/2024. She was admitted for overnight observation and resumed on Eliquis  prior to discharge with no resumption of rate or rhythm control due to ongoing bradycardia. Patient presents today for follow up for atrial fibrillation.   [name] presents today with her daughter for posthospital follow-up.  On examination today she is noted to be in a undetermined rhythm which looks to be possibly an SVT or atrial tach with a rate of 118 bpm.  I have reached out to Dr. Almetta regarding her rhythm for further guidance and we will also try to get her in for follow-up sooner than April.    She underwent an ablation procedure and has been feeling generally well since. However, she experiences episodes of heart racing, which she manages by sitting down and taking deep breaths. These episodes are not  continuous, with heart rates recorded at 125 bpm on Tuesday and 118 bpm today. Previously, she was on medication that significantly slowed her heart rate, causing fatigue, but since discontinuing it, she feels less tired. She is currently taking Eliquis  without any missed doses. Her heart rate remains elevated, seldom dropping below 100 bpm. She experiences fatigue, which she attributes to low iron levels, and is scheduled for an iron infusion next week. She notices some leg swelling, which she attributes to standing for long hours at work. She works 12-hour shifts, five days a week, and finds working beneficial for her mental well-being. She sleeps well at night but has not been tested for sleep apnea, which her daughter suspects she might have. She avoids caffeine and alcohol, and her diet includes diet drinks like ginger ale and Coke Zero.   Discussed the use of AI scribe software for clinical note transcription with the patient, who gave verbal consent to proceed.   Notes: History of thyroid  disease - Tikosyn, no class Ic's or Multaq, amiodarone    Atrial Fibrillation Management history: History of Sleep Apnea (needs sleep study) Previous antiarrhythmic drugs: None Previous cardioversions: None Previous ablations: 11/06/2024 Anticoagulation history: Eliquis   ROS- All systems are reviewed and negative except as per the HPI above.  Past Medical History:  Diagnosis Date   (HFpEF) heart failure with preserved ejection fraction ([name])    a. EF previously 30-35% in 02/2021 in the setting of afib w/RVR --> normalized by repeat imaging in 05/2021.   Anxiety    Atrial flutter ([name])    a. s/p DCCV  in 02/2021   Atrial septal defect    Surgical repair in 1994   Cardiomyopathy [facility])    Chest pain    Normal coronary angiography in 1994 and 2011   Dizziness    Endometrial cancer, grade I ([name]) 10/09/2019   Essential thrombocythemia ([name]) 06/06/2016   GERD (gastroesophageal reflux disease)     History of kidney stones    Hyperlipidemia    Hypertension    Iron deficiency 12/02/2024   LBBB (left bundle branch block) 2011   Mitral regurgitation    Palpitations    Persistent atrial fibrillation ([name])    PONV (postoperative nausea and vomiting)    Syncope    Tricuspid regurgitation    Type 2 diabetes mellitus with other specified complication ([name]) 07/18/2019   Ventricular tachycardia ([name])    Exercise induced   Past Surgical History:  Procedure Laterality Date   A-FLUTTER ABLATION N/A 11/06/2024   Procedure: A-FLUTTER ABLATION;  Surgeon: Almetta Donnice LABOR, MD;  Location: [facility] INVASIVE CV LAB;  Service: Cardiovascular;  Laterality: N/A;   APPENDECTOMY     ASD REPAIR  1994   [facility]   ATRIAL FIBRILLATION ABLATION N/A 11/06/2024   Procedure: ATRIAL FIBRILLATION ABLATION;  Surgeon: Almetta Donnice LABOR, MD;  Location: [facility] INVASIVE CV LAB;  Service: Cardiovascular;  Laterality: N/A;   ATRIAL TACH ABLATION N/A 11/06/2024   Procedure: ATRIAL TACH ABLATION;  Surgeon: Almetta Donnice LABOR, MD;  Location: [facility] INVASIVE CV LAB;  Service: Cardiovascular;  Laterality: N/A;   BREAST EXCISIONAL BIOPSY     Right and left   BREAST SURGERY     right and left breast-benign   CARDIAC CATHETERIZATION  12/24/2009   Dr. Wonda    CARDIOVERSION N/A 03/04/2021   Procedure: CARDIOVERSION;  Surgeon: Alvan Dorn FALCON, MD;  Location: [facility];  Service: Endoscopy;  Laterality: N/A;   CATARACT EXTRACTION W/PHACO Left 03/11/2013   Procedure: CATARACT EXTRACTION PHACO AND INTRAOCULAR LENS PLACEMENT (IOC);  Surgeon: Oneil T. Roz, MD;  Location: [facility];  Service: Ophthalmology;  Laterality: Left;  CDE:  12.20   HYSTEROSCOPY WITH D & C N/A 11/11/2019   Procedure: DILATATION AND CURETTAGE /HYSTEROSCOPY;  Surgeon: Edsel Norleen GAILS, MD;  Location: [facility];  Service: Gynecology;  Laterality: N/A;   POLYPECTOMY N/A 11/11/2019   Procedure: POLYPECTOMY(ENDOMETRIAL POLYP);  Surgeon: Edsel Norleen GAILS, MD;  Location:  [facility];  Service: Gynecology;  Laterality: N/A;   ROBOTIC ASSISTED LAPAROSCOPIC HYSTERECTOMY AND SALPINGECTOMY Bilateral 12/09/2019   Procedure: XI ROBOTIC ASSISTED LAPAROSCOPIC HYSTERECTOMY BILATERAL SALPINGOOOPHORECTOMY;  Surgeon: Eloy Herring, MD;  Location: [facility];  Service: Gynecology;  Laterality: Bilateral;   SENTINEL NODE BIOPSY N/A 12/09/2019   Procedure: SENTINEL LYMPH  NODE BIOPSY;  Surgeon: Eloy Herring, MD;  Location: [facility];  Service: Gynecology;  Laterality: N/A;   TEE WITHOUT CARDIOVERSION N/A 03/04/2021   Procedure: TRANSESOPHAGEAL ECHOCARDIOGRAM (TEE);  Surgeon: Alvan Dorn FALCON, MD;  Location: [facility];  Service: Endoscopy;  Laterality: N/A;   TONSILLECTOMY     TUBAL LIGATION     Bilateral   Codeine and Morphine  Current Outpatient Medications  Medication Sig Dispense Refill   atorvastatin  (LIPITOR) 40 MG tablet Take 1 tablet (40 mg total) by mouth at bedtime. 90 tablet 3   [employer] VITAMIN B12 1000 MCG tablet Take 1,000 mcg by mouth daily.     cycloSPORINE  (RESTASIS ) 0.05 % ophthalmic emulsion Place 1 drop into both eyes 2 (two) times daily.     ELIQUIS  5 MG TABS tablet TAKE  1 TABLET BY MOUTH TWICE A DAY 60 tablet 5   fenofibrate  160 MG tablet TAKE 1 TABLET BY MOUTH EVERY DAY 90 tablet 3   ferrous sulfate  325 (65 FE) MG EC tablet Take 1 tablet (325 mg total) by mouth daily with breakfast. 100 tablet 3   furosemide  (LASIX ) 20 MG tablet TAKE 1 TABLET (20 MG TOTAL) BY MOUTH DAILY AS NEEDED (SWELLING). 90 tablet 2   hydroxyurea  (HYDREA ) 500 MG capsule Take 1 capsule (500 mg total) by mouth daily. 90 capsule 3   levothyroxine  (SYNTHROID ) 75 MCG tablet Take 1 tablet (75 mcg total) by mouth daily before breakfast. 90 tablet 2   metFORMIN  (GLUCOPHAGE ) 500 MG tablet Take 1 tablet (500 mg total) by mouth 2 (two) times daily with a meal. 180 tablet 3   methenamine  (HIPREX ) 1 g tablet TAKE 1 TABLET (1 G TOTAL) BY MOUTH 2 (TWO) TIMES DAILY WITH A MEAL. 180 tablet 3   omeprazole  (PRILOSEC) 20 MG  capsule TAKE 1 CAPSULE BY MOUTH EVERY DAY 90 capsule 0   prednisoLONE  acetate (PRED FORTE ) 1 % ophthalmic suspension Place 1 drop into both eyes 2 (two) times daily.     sacubitril -valsartan  (ENTRESTO ) 24-26 MG Take 1 tablet by mouth 2 (two) times daily.     No current facility-administered medications for this encounter.    Physical Exam: BP (!) 118/90   Pulse (!) 118   Ht 5' 4 (1.626 m)   Wt 74.4 kg   BMI 28.15 kg/m   GEN: Well nourished, well developed in no acute distress NECK: No JVD; No carotid bruits CARDIAC:  irregular rate and rhythm, no murmurs, rubs, gallops RESPIRATORY:  Clear to auscultation without rales, wheezing or rhonchi  ABDOMEN: Soft, non-tender, non-distended EXTREMITIES:  No edema; No deformity   Wt Readings from Last 3 Encounters:  12/04/24 74.4 kg  12/02/24 73.5 kg  11/06/24 81 kg    Lab Results  Component Value Date   TSH 8.220 (H) 10/23/2024   EKG today demonstrates:   EKG Interpretation Date/Time:  Thursday December 04 2024 14:09:00 EST Ventricular Rate:  118 PR Interval:    QRS Duration:  132 QT Interval:  368 QTC Calculation: 515 R Axis:   97  Text Interpretation: Undetermined rhythm possible SVT Rightward axis Non-specific intra-ventricular conduction block Cannot rule out Anterior infarct , age undetermined Abnormal ECG When compared with ECG of 07-Nov-2024 05:16, Current undetermined rhythm precludes rhythm comparison, needs review Questionable change in QRS axis Confirmed by Wyn Manus [id number]) on 12/04/2024 3:28:30 PM        Echo Completed 10/24/2024:  1. Left ventricular ejection fraction, by estimation, is 55 to 60%. The  left ventricle has normal function. The left ventricle has no regional  wall motion abnormalities. There is mild left ventricular hypertrophy.  Left ventricular diastolic parameters  are indeterminate.   2. Right ventricular systolic function is mildly reduced. The right  ventricular size is mildly enlarged.  There is mildly elevated pulmonary  artery systolic pressure. The estimated right ventricular systolic  pressure is 37.4 mmHg.   3. Left atrial size was mildly dilated.   4. Right atrial size was mildly dilated.   5. The mitral valve is degenerative. Moderate mitral valve regurgitation.  No evidence of mitral stenosis.   6. Tricuspid valve regurgitation is moderate to severe.   7. The aortic valve is tricuspid. There is mild calcification of the  aortic valve. Aortic valve regurgitation is not visualized. Aortic valve  sclerosis/calcification is  present, without any evidence of aortic  stenosis.   8. The inferior vena cava is dilated in size with >50% respiratory  variability, suggesting right atrial pressure of 8 mmHg.   CHA2DS2-VASc Score = 6  The patient's score is based upon: CHF History: 1 HTN History: 1 Diabetes History: 0 Stroke History: 0 Vascular Disease History: 1 Age Score: 2 Gender Score: 1       ASSESSMENT AND PLAN:  Persistent Atrial Fibrillation (ICD10:  I48.19) The patient's CHA2DS2-VASc score is 6, indicating a 9.7% annual risk of stroke.   -s/p CTI ablation with mitral annular ablation and attempted AT ablation completed on 11/06/2024 -Patient presents today with intermittent tachycardia appearing to be atrial tach versus SVT.  Patient previously discontinued both amiodarone  and metoprolol  due to symptomatic bradycardia. - Awaiting Dr. Garnette guidance on medication management. - Continue monitoring heart rate and rhythm. - Consult with Dr. Almetta regarding medication adjustments, restoring rate or rhythm control. - Encouraged rest and reduced work hours. - Referred to sleep medicine for suspected sleep apnea. - Advised monitoring for fluid overload due to tachycardia, manage with Lasix  if needed. - Continue Eliquis  5 mg twice daily  HTN: BP well controlled. Continue current antihypertensive regimen.   HFrecEF: -2D echo completed 09/2024 with stable  EF of 55 to 60% - Patient is volume status stable on exam and currently on GDMT for management of CHF.  Iron deficiency anemia Low iron levels contributing to fatigue and possibly affecting heart rate. - Proceed with scheduled iron infusions.   Signed,  Wyn Mickey Jackee Victory, NP    12/04/2024 3:33 PM    Follow up with the AF Clinic in 2 weeks if possible and in April with Dr. Almetta as scheduled    "

## 2024-12-11 ENCOUNTER — Inpatient Hospital Stay

## 2024-12-23 ENCOUNTER — Ambulatory Visit

## 2025-01-09 ENCOUNTER — Ambulatory Visit: Admitting: Adult Health

## 2025-01-09 ENCOUNTER — Ambulatory Visit: Admitting: Family Medicine

## 2025-01-13 ENCOUNTER — Ambulatory Visit: Admitting: Cardiology

## 2025-02-06 ENCOUNTER — Ambulatory Visit: Admitting: Student in an Organized Health Care Education/Training Program

## 2025-04-06 ENCOUNTER — Inpatient Hospital Stay

## 2025-04-06 ENCOUNTER — Inpatient Hospital Stay: Admitting: Physician Assistant
